# Patient Record
Sex: Male | Born: 1950
Health system: Southern US, Community
[De-identification: ages and names within clinical notes are randomized; demographics above are authoritative.]

## PROBLEM LIST (undated history)

## (undated) DIAGNOSIS — I10 Essential (primary) hypertension: Secondary | ICD-10-CM

## (undated) DIAGNOSIS — I2510 Atherosclerotic heart disease of native coronary artery without angina pectoris: Secondary | ICD-10-CM

## (undated) DIAGNOSIS — I719 Aortic aneurysm of unspecified site, without rupture: Secondary | ICD-10-CM

## (undated) DIAGNOSIS — E119 Type 2 diabetes mellitus without complications: Secondary | ICD-10-CM

## (undated) DIAGNOSIS — M545 Low back pain, unspecified: Secondary | ICD-10-CM

## (undated) DIAGNOSIS — E538 Deficiency of other specified B group vitamins: Secondary | ICD-10-CM

## (undated) DIAGNOSIS — R252 Cramp and spasm: Secondary | ICD-10-CM

## (undated) DIAGNOSIS — D492 Neoplasm of unspecified behavior of bone, soft tissue, and skin: Secondary | ICD-10-CM

## (undated) DIAGNOSIS — R42 Dizziness and giddiness: Secondary | ICD-10-CM

## (undated) DIAGNOSIS — T7840XA Allergy, unspecified, initial encounter: Secondary | ICD-10-CM

## (undated) DIAGNOSIS — F32A Depression, unspecified: Secondary | ICD-10-CM

## (undated) DIAGNOSIS — F419 Anxiety disorder, unspecified: Secondary | ICD-10-CM

## (undated) DIAGNOSIS — K219 Gastro-esophageal reflux disease without esophagitis: Secondary | ICD-10-CM

## (undated) DIAGNOSIS — F329 Major depressive disorder, single episode, unspecified: Secondary | ICD-10-CM

## (undated) DIAGNOSIS — E291 Testicular hypofunction: Secondary | ICD-10-CM

## (undated) DIAGNOSIS — E785 Hyperlipidemia, unspecified: Secondary | ICD-10-CM

## (undated) DIAGNOSIS — M1990 Unspecified osteoarthritis, unspecified site: Secondary | ICD-10-CM

## (undated) HISTORY — DX: Depression, unspecified: F32.A

## (undated) HISTORY — PX: INGUINAL HERNIA REPAIR: SUR1180

## (undated) HISTORY — PX: FOOT SURGERY: SHX648

## (undated) HISTORY — PX: OTHER SURGICAL HISTORY: SHX169

## (undated) HISTORY — DX: Cramp and spasm: R25.2

## (undated) HISTORY — DX: Gastro-esophageal reflux disease without esophagitis: K21.9

## (undated) HISTORY — DX: Unspecified osteoarthritis, unspecified site: M19.90

## (undated) HISTORY — PX: POLYPECTOMY: SHX149

## (undated) HISTORY — PX: COLONOSCOPY: SHX174

## (undated) HISTORY — DX: Low back pain: M54.5

## (undated) HISTORY — DX: Low back pain, unspecified: M54.50

## (undated) HISTORY — DX: Aortic aneurysm of unspecified site, without rupture: I71.9

## (undated) HISTORY — DX: Essential (primary) hypertension: I10

## (undated) HISTORY — DX: Neoplasm of unspecified behavior of bone, soft tissue, and skin: D49.2

## (undated) HISTORY — DX: Major depressive disorder, single episode, unspecified: F32.9

## (undated) HISTORY — DX: Anxiety disorder, unspecified: F41.9

## (undated) HISTORY — DX: Hyperlipidemia, unspecified: E78.5

## (undated) HISTORY — DX: Dizziness and giddiness: R42

## (undated) HISTORY — DX: Testicular hypofunction: E29.1

## (undated) HISTORY — DX: Type 2 diabetes mellitus without complications: E11.9

## (undated) HISTORY — DX: Allergy, unspecified, initial encounter: T78.40XA

## (undated) HISTORY — DX: Deficiency of other specified B group vitamins: E53.8

## (undated) HISTORY — DX: Atherosclerotic heart disease of native coronary artery without angina pectoris: I25.10

---

## 2001-07-14 ENCOUNTER — Encounter: Payer: Self-pay | Admitting: Gastroenterology

## 2004-05-01 ENCOUNTER — Ambulatory Visit: Payer: Self-pay | Admitting: Internal Medicine

## 2004-05-20 ENCOUNTER — Ambulatory Visit: Payer: Self-pay | Admitting: Internal Medicine

## 2004-07-07 ENCOUNTER — Ambulatory Visit: Payer: Self-pay

## 2004-12-01 ENCOUNTER — Ambulatory Visit: Payer: Self-pay | Admitting: Internal Medicine

## 2005-02-18 ENCOUNTER — Ambulatory Visit: Payer: Self-pay | Admitting: Internal Medicine

## 2005-02-25 ENCOUNTER — Ambulatory Visit: Payer: Self-pay | Admitting: Internal Medicine

## 2005-06-02 ENCOUNTER — Ambulatory Visit: Payer: Self-pay | Admitting: Internal Medicine

## 2005-06-09 ENCOUNTER — Ambulatory Visit: Payer: Self-pay | Admitting: Internal Medicine

## 2005-06-23 ENCOUNTER — Ambulatory Visit: Payer: Self-pay | Admitting: Cardiology

## 2005-08-31 ENCOUNTER — Ambulatory Visit: Payer: Self-pay | Admitting: Internal Medicine

## 2005-09-02 ENCOUNTER — Ambulatory Visit: Payer: Self-pay | Admitting: Internal Medicine

## 2005-11-09 ENCOUNTER — Ambulatory Visit: Payer: Self-pay | Admitting: Internal Medicine

## 2005-11-13 ENCOUNTER — Ambulatory Visit: Payer: Self-pay | Admitting: Internal Medicine

## 2008-08-28 ENCOUNTER — Encounter: Payer: Self-pay | Admitting: Internal Medicine

## 2008-10-05 ENCOUNTER — Encounter: Payer: Self-pay | Admitting: Internal Medicine

## 2008-11-26 ENCOUNTER — Telehealth: Payer: Self-pay | Admitting: Internal Medicine

## 2008-11-30 ENCOUNTER — Telehealth: Payer: Self-pay | Admitting: Internal Medicine

## 2008-12-21 ENCOUNTER — Ambulatory Visit: Payer: Self-pay | Admitting: Internal Medicine

## 2008-12-21 DIAGNOSIS — M1710 Unilateral primary osteoarthritis, unspecified knee: Secondary | ICD-10-CM

## 2008-12-21 DIAGNOSIS — M1990 Unspecified osteoarthritis, unspecified site: Secondary | ICD-10-CM | POA: Insufficient documentation

## 2008-12-21 DIAGNOSIS — IMO0002 Reserved for concepts with insufficient information to code with codable children: Secondary | ICD-10-CM | POA: Insufficient documentation

## 2008-12-21 DIAGNOSIS — I2510 Atherosclerotic heart disease of native coronary artery without angina pectoris: Secondary | ICD-10-CM | POA: Insufficient documentation

## 2008-12-21 DIAGNOSIS — F419 Anxiety disorder, unspecified: Secondary | ICD-10-CM | POA: Insufficient documentation

## 2008-12-21 DIAGNOSIS — E1159 Type 2 diabetes mellitus with other circulatory complications: Secondary | ICD-10-CM | POA: Insufficient documentation

## 2008-12-21 DIAGNOSIS — E119 Type 2 diabetes mellitus without complications: Secondary | ICD-10-CM | POA: Insufficient documentation

## 2008-12-21 DIAGNOSIS — E785 Hyperlipidemia, unspecified: Secondary | ICD-10-CM | POA: Insufficient documentation

## 2008-12-21 DIAGNOSIS — R252 Cramp and spasm: Secondary | ICD-10-CM | POA: Insufficient documentation

## 2008-12-21 DIAGNOSIS — I10 Essential (primary) hypertension: Secondary | ICD-10-CM | POA: Insufficient documentation

## 2008-12-21 DIAGNOSIS — F411 Generalized anxiety disorder: Secondary | ICD-10-CM | POA: Insufficient documentation

## 2008-12-21 DIAGNOSIS — M109 Gout, unspecified: Secondary | ICD-10-CM | POA: Insufficient documentation

## 2008-12-21 DIAGNOSIS — F4323 Adjustment disorder with mixed anxiety and depressed mood: Secondary | ICD-10-CM | POA: Insufficient documentation

## 2009-01-15 ENCOUNTER — Ambulatory Visit: Payer: Self-pay | Admitting: Internal Medicine

## 2009-01-15 DIAGNOSIS — M545 Low back pain: Secondary | ICD-10-CM

## 2009-01-16 LAB — CONVERTED CEMR LAB
Bilirubin Urine: NEGATIVE
Ketones, ur: NEGATIVE mg/dL
Total Protein, Urine: NEGATIVE mg/dL
pH: 5.5 (ref 5.0–8.0)

## 2009-02-20 ENCOUNTER — Telehealth: Payer: Self-pay | Admitting: Internal Medicine

## 2009-03-19 ENCOUNTER — Ambulatory Visit: Payer: Self-pay | Admitting: Internal Medicine

## 2009-03-19 LAB — CONVERTED CEMR LAB
ALT: 21 units/L (ref 0–53)
AST: 19 units/L (ref 0–37)
Alkaline Phosphatase: 73 units/L (ref 39–117)
Bilirubin, Direct: 0.2 mg/dL (ref 0.0–0.3)
CO2: 29 meq/L (ref 19–32)
Chloride: 101 meq/L (ref 96–112)
Sodium: 141 meq/L (ref 135–145)
TSH: 1.15 microintl units/mL (ref 0.35–5.50)
Total CHOL/HDL Ratio: 2
Total Protein: 6.9 g/dL (ref 6.0–8.3)

## 2009-03-22 ENCOUNTER — Ambulatory Visit: Payer: Self-pay | Admitting: Internal Medicine

## 2009-03-22 DIAGNOSIS — H9319 Tinnitus, unspecified ear: Secondary | ICD-10-CM | POA: Insufficient documentation

## 2009-03-22 DIAGNOSIS — R42 Dizziness and giddiness: Secondary | ICD-10-CM | POA: Insufficient documentation

## 2009-05-10 ENCOUNTER — Ambulatory Visit: Payer: Self-pay | Admitting: Internal Medicine

## 2009-05-10 DIAGNOSIS — D485 Neoplasm of uncertain behavior of skin: Secondary | ICD-10-CM | POA: Insufficient documentation

## 2009-08-01 ENCOUNTER — Telehealth: Payer: Self-pay | Admitting: Internal Medicine

## 2009-09-24 ENCOUNTER — Telehealth: Payer: Self-pay | Admitting: Internal Medicine

## 2009-12-02 ENCOUNTER — Telehealth (INDEPENDENT_AMBULATORY_CARE_PROVIDER_SITE_OTHER): Payer: Self-pay | Admitting: *Deleted

## 2009-12-02 ENCOUNTER — Ambulatory Visit: Payer: Self-pay | Admitting: Internal Medicine

## 2009-12-02 DIAGNOSIS — H9190 Unspecified hearing loss, unspecified ear: Secondary | ICD-10-CM | POA: Insufficient documentation

## 2009-12-04 LAB — CONVERTED CEMR LAB
Albumin: 4.7 g/dL (ref 3.5–5.2)
BUN: 19 mg/dL (ref 6–23)
Bilirubin, Direct: 0.2 mg/dL (ref 0.0–0.3)
Eosinophils Relative: 1.2 % (ref 0.0–5.0)
GFR calc non Af Amer: 128.99 mL/min (ref >60)
HCT: 39.8 % (ref 39.0–52.0)
HDL: 65 mg/dL (ref >39.00)
Hemoglobin: 13.9 g/dL (ref 13.0–17.0)
Hgb A1c MFr Bld: 6.4 % (ref 4.6–6.5)
Ketones, ur: NEGATIVE mg/dL
Lymphs Abs: 1 10*3/uL (ref 0.7–4.0)
Monocytes Relative: 8.8 % (ref 3.0–12.0)
Platelets: 223 10*3/uL (ref 150.0–400.0)
Potassium: 4.9 meq/L (ref 3.5–5.1)
Sodium: 140 meq/L (ref 135–145)
Specific Gravity, Urine: 1.025 (ref 1.000–1.030)
Total Bilirubin: 0.9 mg/dL (ref 0.3–1.2)
Total Protein, Urine: NEGATIVE mg/dL
Total Protein: 7.2 g/dL (ref 6.0–8.3)
Triglycerides: 41 mg/dL (ref 0.0–149.0)
Uric Acid, Serum: 6.6 mg/dL (ref 4.0–7.8)
Urine Glucose: NEGATIVE mg/dL
VLDL: 8.2 mg/dL (ref 0.0–40.0)
Vitamin B-12: 114 pg/mL — ABNORMAL LOW (ref 211–911)
WBC: 5.9 10*3/uL (ref 4.5–10.5)
pH: 5.5 (ref 5.0–8.0)

## 2009-12-06 ENCOUNTER — Encounter: Payer: Self-pay | Admitting: Internal Medicine

## 2010-01-29 ENCOUNTER — Ambulatory Visit: Payer: Self-pay | Admitting: Cardiovascular Disease

## 2010-02-10 ENCOUNTER — Ambulatory Visit: Payer: Self-pay | Admitting: Internal Medicine

## 2010-02-10 DIAGNOSIS — R5383 Other fatigue: Secondary | ICD-10-CM | POA: Insufficient documentation

## 2010-02-10 DIAGNOSIS — E538 Deficiency of other specified B group vitamins: Secondary | ICD-10-CM | POA: Insufficient documentation

## 2010-02-10 DIAGNOSIS — R5381 Other malaise: Secondary | ICD-10-CM | POA: Insufficient documentation

## 2010-03-20 ENCOUNTER — Ambulatory Visit: Payer: Self-pay | Admitting: Internal Medicine

## 2010-03-20 LAB — CONVERTED CEMR LAB
Testosterone: 315.44 ng/dL — ABNORMAL LOW (ref 350.00–890.00)
Vitamin B-12: >1500 pg/mL — ABNORMAL HIGH (ref 211–911)

## 2010-03-24 ENCOUNTER — Ambulatory Visit: Payer: Self-pay | Admitting: Internal Medicine

## 2010-03-24 DIAGNOSIS — M25569 Pain in unspecified knee: Secondary | ICD-10-CM | POA: Insufficient documentation

## 2010-03-24 DIAGNOSIS — E291 Testicular hypofunction: Secondary | ICD-10-CM | POA: Insufficient documentation

## 2010-03-24 DIAGNOSIS — K4090 Unilateral inguinal hernia, without obstruction or gangrene, not specified as recurrent: Secondary | ICD-10-CM | POA: Insufficient documentation

## 2010-03-25 ENCOUNTER — Telehealth (INDEPENDENT_AMBULATORY_CARE_PROVIDER_SITE_OTHER): Payer: Self-pay | Admitting: *Deleted

## 2010-04-01 ENCOUNTER — Encounter: Payer: Self-pay | Admitting: Internal Medicine

## 2010-04-10 ENCOUNTER — Telehealth: Payer: Self-pay | Admitting: Internal Medicine

## 2010-05-07 ENCOUNTER — Encounter: Payer: Self-pay | Admitting: Cardiovascular Disease

## 2010-05-22 ENCOUNTER — Ambulatory Visit
Admission: RE | Admit: 2010-05-22 | Discharge: 2010-05-22 | Payer: Self-pay | Source: Home / Self Care | Attending: Internal Medicine | Admitting: Internal Medicine

## 2010-05-22 ENCOUNTER — Telehealth (INDEPENDENT_AMBULATORY_CARE_PROVIDER_SITE_OTHER): Payer: Self-pay | Admitting: *Deleted

## 2010-05-22 DIAGNOSIS — J069 Acute upper respiratory infection, unspecified: Secondary | ICD-10-CM | POA: Insufficient documentation

## 2010-05-31 ENCOUNTER — Ambulatory Visit
Admission: RE | Admit: 2010-05-31 | Discharge: 2010-05-31 | Payer: Self-pay | Source: Home / Self Care | Attending: Family Medicine | Admitting: Family Medicine

## 2010-05-31 DIAGNOSIS — J209 Acute bronchitis, unspecified: Secondary | ICD-10-CM | POA: Insufficient documentation

## 2010-06-03 LAB — COMPREHENSIVE METABOLIC PANEL
ALT: 18 U/L (ref 0–53)
AST: 18 U/L (ref 0–37)
Alkaline Phosphatase: 73 U/L (ref 39–117)
CO2: 32 mEq/L (ref 19–32)
Calcium: 9.8 mg/dL (ref 8.4–10.5)
GFR calc Af Amer: >60 mL/min (ref >60)
Glucose, Bld: 140 mg/dL — ABNORMAL HIGH (ref 70–99)
Potassium: 4.8 mEq/L (ref 3.5–5.1)
Sodium: 141 mEq/L (ref 135–145)
Total Protein: 6.8 g/dL (ref 6.0–8.3)

## 2010-06-03 LAB — PROTIME-INR: Prothrombin Time: 13.2 seconds (ref 11.6–15.2)

## 2010-06-03 LAB — CBC
Hemoglobin: 14.2 g/dL (ref 13.0–17.0)
MCH: 31.5 pg (ref 26.0–34.0)
MCV: 91.6 fL (ref 78.0–100.0)
Platelets: 280 10*3/uL (ref 150–400)
RBC: 4.51 MIL/uL (ref 4.22–5.81)
WBC: 4.8 10*3/uL (ref 4.0–10.5)

## 2010-06-03 LAB — DIFFERENTIAL
Lymphs Abs: 1.2 10*3/uL (ref 0.7–4.0)
Monocytes Relative: 9 % (ref 3–12)
Neutro Abs: 3 10*3/uL (ref 1.7–7.7)
Neutrophils Relative %: 64 % (ref 43–77)

## 2010-06-03 LAB — SURGICAL PCR SCREEN: Staphylococcus aureus: POSITIVE — AB

## 2010-06-03 NOTE — Progress Notes (Signed)
Summary: Androgel PA  Phone Note Call from Patient   Caller: Patient Summary of Call: pt states Androgel needs PA.  Initial call taken by: Lanier Prude, [facility]),  March 25, 2010 12:04 PM  Follow-up for Phone Call        Insurance requires PA on androgel 1.62% pump. Ok to proceed?  PA # [phone] [id number] Follow-up by: Lamar Sprinkles, CMA,  March 26, 2010 5:26 PM  Additional Follow-up for Phone Call Additional follow up Details #1::        ok Thank you!  Additional Follow-up by: [name] MD,  March 31, 2010 5:36 PM    Additional Follow-up for Phone Call Additional follow up Details #2::    paperwork will be faxed case number [id number] Follow-up by: Margaret Pyle, CMA,  April 01, 2010 3:29 PM  Additional Follow-up for Phone Call Additional follow up Details #3:: Details for Additional Follow-up Action Taken: Form completed and faxed to [phone] [employer].Marland KitchenMarland KitchenMarland KitchenAlvy Beal Archie CMA  April 01, 2010 3:37 PM   PA Approved 04/01/10-12/25/12.  Additional Follow-up by: Dagoberto Reef,  April 07, 2010 10:56 AM

## 2010-06-03 NOTE — Consult Note (Signed)
Summary: [facility]  [facility]   Imported By: Sherian Rein 01/01/2010 10:07:29  _____________________________________________________________________  External Attachment:    Type:   Image     Comment:   External Document

## 2010-06-03 NOTE — Assessment & Plan Note (Signed)
Summary: FU ON MEDS/ REFERRAL TO CARDIOLOGY /NWS   Vital Signs:  Patient profile:   60 year old male Height:      69 inches Weight:      185 pounds BMI:     27.42 Temp:     97.5 degrees F oral Pulse rate:   64 / minute Pulse rhythm:   regular Resp:     16 per minute BP sitting:   120 / 78  (left arm) Cuff size:   regular  Vitals Entered By: Lanier Prude, CMA(AAMA) (December 02, 2009 8:02 AM) CC: f/u to discuss meds Is Patient Diabetic? Yes Comments pt is not taking Benadryl 25mg . Please remove from his list   CC:  f/u to discuss meds.  History of Present Illness: The patient presents for a follow up of hypertension, CAD, diabetes, hyperlipidemia F/u LBP C/o R knee swelling in inner aspect x years The patient presents for a wellness examination   Preventive Screening-Counseling & Management  Caffeine-Diet-Exercise     Does Patient Exercise: yes  Current Medications (verified): 1)  Norvasc 2.5 Mg Tabs (Amlodipine Besylate) .... Once Daily Qhs 2)  Benadryl 25 Mg Tabs (Diphenhydramine Hcl) .... Once Daily 3)  Glucophage 1000 Mg Tabs (Metformin Hcl) .... Two Times A Day 4)  Diovan 320 Mg Tabs (Valsartan) .... Once Daily 5)  Aspirin 81 Mg Tbec (Aspirin) .... Once Daily 6)  Fluoxetine Hcl 20 Mg Caps (Fluoxetine Hcl) .... Once Daily 7)  Ambien 5 Mg Tabs (Zolpidem Tartrate) .... 2.5 At Bedtime 8)  Vitamin C 1000 Mg Tabs (Ascorbic Acid) .... Two Times A Day 9)  Coq10 100 Mg Caps (Coenzyme Q10) .... Take 300 For Leg Aches As Needed 10)  Vitamin D3 1000 Unit  Tabs (Cholecalciferol) .[name].. 1 By Mouth Daily 11)  Crestor 40 Mg Tabs (Rosuvastatin Calcium) .[name].. 1 By Mouth Every Other Day 12)  Ibuprofen 600 Mg  Tabs (Ibuprofen) .[name].. 1 By Mouth Two Times A Day Pc As Needed Pain 13)  Tramadol Hcl 50 Mg  Tabs (Tramadol Hcl) .[name].. 1-2 By Mouth Two Times A Day As Needed Pain 14)  Lorazepam 1 Mg Tabs (Lorazepam) .[name].. 1 By Mouth Three  Times A Day As Needed Anxiety  Allergies (verified): No  Known Drug Allergies  Past History:  Past Medical History: Last updated: 03/22/2009 Anxiety Coronary artery disease Depression Gout Hyperlipidemia Hypertension Diabetes mellitus, type II Low back pain Vertigo, tinnitus, hearing loss s/p ENT eval 2009  Family History: Last updated: 12/21/2008 Family History of CAD Male 1st degree relative <50 Family History Hypertension  Past Surgical History: Stent procedure  Social History: Occupation: [employer], maintenance Married, 2 girls Alcohol use-yes Drug use-no Regular exercise-yes, swimming Does Patient Exercise:  yes  Review of Systems       The patient complains of hematochezia and depression.  The patient denies anorexia, abdominal pain, melena, fever, weight loss, weight gain, vision loss, decreased hearing, hoarseness, chest pain, syncope, dyspnea on exertion, peripheral edema, prolonged cough, headaches, hemoptysis, severe indigestion/heartburn, hematuria, incontinence, genital sores, muscle weakness, suspicious skin lesions, transient blindness, difficulty walking, unusual weight change, abnormal bleeding, enlarged lymph nodes, angioedema, and testicular masses.         Lost wt on diet. LBP, fatigue  Physical Exam  General:  overweight-appearing.   Head:  Normocephalic and atraumatic without obvious abnormalities. No apparent alopecia or balding. Eyes:  No corneal or conjunctival inflammation noted. EOMI. Perrla. Funduscopic exam benign, without hemorrhages, exudates or papilledema. Vision  grossly normal. Ears:  External ear exam shows no significant lesions or deformities.  Otoscopic examination reveals clear canals, tympanic membranes are intact bilaterally without bulging, retraction, inflammation or discharge. Hearing is grossly normal bilaterally. Nose:  External nasal examination shows no deformity or inflammation. Nasal mucosa are pink and moist without lesions or exudates. Mouth:  Oral mucosa and oropharynx  without lesions or exudates.  Teeth in good repair. Neck:  No deformities, masses, or tenderness noted. Chest Wall:  No deformities, masses, tenderness or gynecomastia noted. Lungs:  Normal respiratory effort, chest expands symmetrically. Lungs are clear to auscultation, no crackles or wheezes. Heart:  Normal rate and regular rhythm. S1 and S2 normal without gallop, murmur, click, rub or other extra sounds. Abdomen:  Bowel sounds positive,abdomen soft and non-tender without masses, organomegaly or hernias noted. Rectal:  No external abnormalities noted. Normal sphincter tone. No rectal masses or tenderness. G(-) Genitalia:  Testes bilaterally descended without nodularity, tenderness or masses. No scrotal masses or lesions. No penis lesions or urethral discharge. Prostate:  1+ enlarged.   Msk:  Lumbar-sacral spine is a little  tender to palpation over paraspinal muscles and painfull with the ROM on L R medial distal femur head with possible lipoma, NT Pulses:  R and L carotid,radial,femoral,dorsalis pedis and posterior tibial pulses are full and equal bilaterally Extremities:  No clubbing, cyanosis, edema, or deformity noted with normal full range of motion of all joints.   Neurologic:  No cranial nerve deficits noted. Station and gait are normal. Plantar reflexes are down-going bilaterally. DTRs are symmetrical throughout. Sensory, motor and coordinative functions appear intact. Skin:  6x3 mm mole on mid abdomen Cervical Nodes:  No lymphadenopathy noted Inguinal Nodes:  No significant adenopathy Psych:  Cognition and judgment appear intact. Alert and cooperative with normal attention span and concentration. No apparent delusions, illusions, hallucinations a little depressed appearing.  not suicidal.     Impression & Recommendations:  Problem # 1:  PHYSICAL EXAMINATION (ICD-V70.0) Assessment New  Colon recomended Labs ordered Health and age related issues were discussed. Available  screening tests and vaccinations were discussed as well. Healthy life style including good diet and execise was discussed.   Orders: TLB-BMP (Basic Metabolic Panel-BMET) (80048-METABOL) TLB-CBC Platelet - w/Differential (85025-CBCD) TLB-Hepatic/Liver Function Pnl (80076-HEPATIC) TLB-Lipid Panel (80061-LIPID) TLB-PSA (Prostate Specific Antigen) (84153-PSA) TLB-TSH (Thyroid Stimulating Hormone) (84443-TSH) TLB-Udip ONLY (81003-UDIP) Gastroenterology Referral (GI)  Problem # 2:  DIABETES MELLITUS, TYPE II (ICD-250.00) Assessment: Improved  His updated medication list for this problem includes:    Glucophage 1000 Mg Tabs (Metformin hcl) .[name]..[name] Two times a day    Diovan 320 Mg Tabs (Valsartan) ..... Once daily    Aspirin 81 Mg Tbec (Aspirin) ..... Once daily  Orders: TLB-A1C / Hgb A1C (Glycohemoglobin) (83036-A1C) TLB-B12, Serum-Total ONLY (19147-W29) TLB-Uric Acid, Blood (84550-URIC)  Problem # 3:  LEG CRAMPS (ICD-729.82) Assessment: Improved  Problem # 4:  HYPERLIPIDEMIA (ICD-272.4) Assessment: Improved  His updated medication list for this problem includes:    Crestor 40 Mg Tabs (Rosuvastatin calcium) .[name]... 1 by mouth every other day  Orders: TLB-A1C / Hgb A1C (Glycohemoglobin) (83036-A1C) TLB-B12, Serum-Total ONLY (56213-Y86) TLB-Uric Acid, Blood (84550-URIC)  Problem # 5:  GOUT (ICD-274.9) Assessment: Improved  Problem # 6:  DEPRESSION (ICD-311) Assessment: Unchanged  The following medications were removed from the medication list:    Fluoxetine Hcl 20 Mg Caps (Fluoxetine hcl) ..... Once daily His updated medication list for this problem includes:    Lorazepam 1 Mg Tabs (Lorazepam) .Marland KitchenMarland KitchenMarland KitchenMarland Kitchen  1 by mouth three  times a day as needed anxiety    Cymbalta 60 Mg Cpep (Duloxetine hcl) .[name]... 1 by mouth once daily for depression  Problem # 7:  CORONARY ARTERY DISEASE (ICD-414.00) Assessment: Unchanged  His updated medication list for this problem includes:    Norvasc 2.5 Mg  Tabs (Amlodipine besylate) ..... Once daily qhs    Diovan 320 Mg Tabs (Valsartan) ..... Once daily    Aspirin 81 Mg Tbec (Aspirin) ..... Once daily  Orders: TLB-A1C / Hgb A1C (Glycohemoglobin) (83036-A1C) TLB-B12, Serum-Total ONLY (40347-Q25) TLB-Uric Acid, Blood (84550-URIC) Cardiology Referral (Cardiology) Dr [name]  Problem # 8:  LOW BACK PAIN (ICD-724.2) Assessment: Unchanged See "Patient Instructions".  His updated medication list for this problem includes:    Aspirin 81 Mg Tbec (Aspirin) ..... Once daily    Ibuprofen 600 Mg Tabs (Ibuprofen) .[name]... 1 by mouth two times a day pc as needed pain    Tramadol Hcl 50 Mg Tabs (Tramadol hcl) .[name]... 1-2 by mouth two times a day as needed pain  Problem # 9:  HEARING LOSS, MILD (ICD-389.9) Assessment: New  Orders: Audiology (Audio)  Complete Medication List: 1)  Norvasc 2.5 Mg Tabs (Amlodipine besylate) .... Once daily qhs 2)  Benadryl 25 Mg Tabs (Diphenhydramine hcl) .... Once daily 3)  Glucophage 1000 Mg Tabs (Metformin hcl) .... Two times a day 4)  Diovan 320 Mg Tabs (Valsartan) .... Once daily 5)  Aspirin 81 Mg Tbec (Aspirin) .... Once daily 6)  Ambien 5 Mg Tabs (Zolpidem tartrate) .... 2.5 at bedtime 7)  Vitamin C 1000 Mg Tabs (Ascorbic acid) .... Two times a day 8)  Coq10 100 Mg Caps (Coenzyme q10) .... Take 300 for leg aches as needed 9)  Vitamin D3 1000 Unit Tabs (Cholecalciferol) .[name].. 1 by mouth daily 10)  Crestor 40 Mg Tabs (Rosuvastatin calcium) .[name].. 1 by mouth every other day 11)  Ibuprofen 600 Mg Tabs (Ibuprofen) .[name].. 1 by mouth two times a day pc as needed pain 12)  Tramadol Hcl 50 Mg Tabs (Tramadol hcl) .[name].. 1-2 by mouth two times a day as needed pain 13)  Lorazepam 1 Mg Tabs (Lorazepam) .[name].. 1 by mouth three  times a day as needed anxiety 14)  Cymbalta 60 Mg Cpep (Duloxetine hcl) .[name].. 1 by mouth once daily for depression  Patient Instructions: 1)  Please schedule a follow-up appointment in 2 months. 2)  Use  stretching and balance exercises that I have provided (15 min. or longer every day)  Prescriptions: AMBIEN 5 MG TABS (ZOLPIDEM TARTRATE) 2.5 at bedtime  #180 x 1   Entered and Authorized by:   [name] MD   Signed by:   [name] MD on 12/02/2009   Method used:   Print then Give to Patient   RxID:   [id number] CYMBALTA 60 MG CPEP (DULOXETINE HCL) 1 by mouth once daily for depression  #30 x 6   Entered and Authorized by:   [name] MD   Signed by:   [name] MD on 12/02/2009   Method used:   Print then Give to Patient   RxID:   [id number]

## 2010-06-03 NOTE — Progress Notes (Signed)
----   Converted from flag ---- ---- 12/02/2009 9:36 AM, Edman Circle wrote: appt 8/30 @ 11:45 with [name]  ---- 12/02/2009 8:53 AM, Dagoberto Reef wrote: Thanks  ---- 12/02/2009 8:45 AM, Georgina Quint Plotnikov MD wrote: The following orders have been entered for this patient and placed on Admin Hold:  Type:     Referral       Code:   Audio Description:   Audiology Order Date:   12/02/2009   Authorized By:   [name] MD Order #:   [id number] Clinical Notes:   Aim hearind Dx hearing loss  Type:     Referral       Code:   Cardiology Description:   Cardiology Referral Order Date:   12/02/2009   Authorized By:   [name] MD Order #:   [id number] Clinical Notes:   Dr [name] Dx CAD  Type:     Referral       Code:   GI Description:   Gastroenterology Referral Order Date:   12/02/2009   Authorized By:   [name] MD Order #:   [id number] Clinical Notes:   In Nov 2011 - screen colon Dr Marina Goodell ------------------------------

## 2010-06-03 NOTE — Progress Notes (Signed)
Summary: med change  Phone Note From Pharmacy   Summary of Call: colchicine is d/c. Per MD ok to change to Colcrys. *updated med list. Initial call taken by: Lucious Groves,  August 01, 2009 10:40 AM    New/Updated Medications: COLCRYS 0.6 MG TABS (COLCHICINE) take as directed Prescriptions: COLCRYS 0.6 MG TABS (COLCHICINE) take as directed  #30 x 3   Entered by:   Lucious Groves   Authorized by:   [name] MD   Signed by:   Lucious Groves on 08/01/2009   Method used:   Electronically to        [facility]* (retail)       [address]       [place], [place]  [address]       Ph: [phone]       Fax: [phone]   RxID:   [id number]

## 2010-06-03 NOTE — Progress Notes (Signed)
Summary: hernia  Phone Note Call from Patient Call back at Home Phone [phone]   Caller: Patient Reason for Call: Talk to Doctor, Referral Summary of Call: Pt states at last vist md dx him with hernia. at the time it was'nt bothering him. Pt states since then hernia has gotten bigger,and requesting referral to have hernia removed. Did inform pt md out of office this pm will be tomomorrow to let him know status. Initial call taken by: Orlan Leavens RMA,  April 10, 2010 4:59 PM  Follow-up for Phone Call        ok Surg consult  Follow-up by: [name] MD,  April 11, 2010 1:06 PM  Additional Follow-up for Phone Call Additional follow up Details #1::        Pt informed  Additional Follow-up by: Lamar Sprinkles, CMA,  April 11, 2010 1:58 PM

## 2010-06-03 NOTE — Miscellaneous (Signed)
Summary: [facility]   Imported By: Lennie Odor 05/15/2009 15:17:51  _____________________________________________________________________  External Attachment:    Type:   Image     Comment:   External Document

## 2010-06-03 NOTE — Assessment & Plan Note (Signed)
Summary: 2 MONTH FOLLOW UP-LB   Vital Signs:  Patient profile:   60 year old male Height:      69 inches Weight:      184 pounds BMI:     27.27 Pulse rate:   60 / minute Pulse rhythm:   regular Resp:     16 per minute BP sitting:   136 / 80  (left arm) Cuff size:   regular  Vitals Entered By: Lanier Prude, CMA(AAMA) (February 10, 2010 8:03 AM) CC: 2 mo f/u Is Patient Diabetic? Yes Comments pt states Cymbalta caused dizziness and disorientaion so he could not take it.      CC:  2 mo f/u.  History of Present Illness: The patient presents for a follow up of hypertension, diabetes, hyperlipidemia C/o worsening depression, dizziness  Current Medications (verified): 1)  Norvasc 2.5 Mg Tabs (Amlodipine Besylate) .... Once Daily Qhs 2)  Glucophage 1000 Mg Tabs (Metformin Hcl) .... Two Times A Day 3)  Diovan 320 Mg Tabs (Valsartan) .... Once Daily 4)  Aspirin 81 Mg Tbec (Aspirin) .... Once Daily 5)  Ambien 5 Mg Tabs (Zolpidem Tartrate) .... 2.5 At Bedtime 6)  Vitamin C 1000 Mg Tabs (Ascorbic Acid) .... Two Times A Day 7)  Coq10 100 Mg Caps (Coenzyme Q10) .... Take 300 For Leg Aches As Needed 8)  Vitamin D3 1000 Unit  Tabs (Cholecalciferol) .[name].. 1 By Mouth Daily 9)  Crestor 40 Mg Tabs (Rosuvastatin Calcium) .[name].. 1 By Mouth Every Other Day 10)  Tramadol Hcl 50 Mg  Tabs (Tramadol Hcl) .[name].. 1-2 By Mouth Two Times A Day As Needed Pain 11)  Lorazepam 1 Mg Tabs (Lorazepam) .... As Needed 12)  Aleve 220 Mg Tabs (Naproxen Sodium) .... As Needed 13)  Bd Lancet Ultrafine 33g  Misc (Lancets) .... As Needed  Allergies (verified): 1)  Cymbalta 2)  Wellbutrin 3)  Effexor 4)  Fluoxetine Hcl  Past History:  Social History: Last updated: 12/02/2009 Occupation: [employer], maintenance Married, 2 girls Alcohol use-yes Drug use-no Regular exercise-yes, swimming  Past Medical History: HYPERTENSION HYPERLIPIDEMIA CORONARY ARTERY DISEASE  HEARING LOSS, MILD  PHYSICAL EXAMINATION    NEOPLASM OF UNCERTAIN BEHAVIOR OF SKIN  TINNITUS VERTIGO tinnitus, hearing loss s/p ENT eval 2009 LOW BACK PAIN  LEG CRAMPS FAMILY HISTORY OF CAD MALE 1ST DEGREE RELATIVE <50  OSTEOARTHROSIS UNSPEC WHETHER GEN/LOC LOWER LEG  DIABETES MELLITUS, TYPE II GOUT  DEPRESSION  ANXIETY Vit B12 def  Review of Systems  The patient denies fever, chest pain, syncope, abdominal pain, difficulty walking, and depression.    Physical Exam  General:  overweight-appearing.   Nose:  External nasal examination shows no deformity or inflammation. Nasal mucosa are pink and moist without lesions or exudates. Mouth:  Oral mucosa and oropharynx without lesions or exudates.  Teeth in good repair. Lungs:  Normal respiratory effort, chest expands symmetrically. Lungs are clear to auscultation, no crackles or wheezes. Heart:  Normal rate and regular rhythm. S1 and S2 normal without gallop, murmur, click, rub or other extra sounds. Abdomen:  Bowel sounds positive,abdomen soft and non-tender without masses, organomegaly or hernias noted. Msk:  Lumbar-sacral spine is a little  tender to palpation over paraspinal muscles and painfull with the ROM on L R medial distal femur head with possible lipoma, NT Extremities:  No clubbing, cyanosis, edema, or deformity noted with normal full range of motion of all joints.   Neurologic:  No cranial nerve deficits noted. Station and gait are normal.  Plantar reflexes are down-going bilaterally. DTRs are symmetrical throughout. Sensory, motor and coordinative functions appear intact. Skin:  6x3 mm mole on mid abdomen Psych:  Cognition and judgment appear intact. Alert and cooperative with normal attention span and concentration. No apparent delusions, illusions, hallucinations a little depressed appearing.  not suicidal.     Impression & Recommendations:  Problem # 1:  VITAMIN B12 DEFICIENCY (ICD-266.2) Assessment New For some  reason he has not been taking B12. B12 def  implications discussed Risks of noncompliance with treatment discussed. Compliance encouraged.  Orders: Vit B12 1000 mcg (J3420) Admin of Therapeutic Inj  intramuscular or subcutaneous (40981)  Problem # 2:  FATIGUE (ICD-780.79) Assessment: Unchanged Treat #1  Problem # 3:  HYPERLIPIDEMIA (ICD-272.4) Assessment: Unchanged  His updated medication list for this problem includes:    Crestor 40 Mg Tabs (Rosuvastatin calcium) .[name]... 1 by mouth every other day  Problem # 4:  CORONARY ARTERY DISEASE (ICD-414.00) Assessment: Unchanged  His updated medication list for this problem includes:    Norvasc 2.5 Mg Tabs (Amlodipine besylate) ..... Once daily qhs    Diovan 320 Mg Tabs (Valsartan) ..... Once daily    Aspirin 81 Mg Tbec (Aspirin) ..... Once daily  Problem # 5:  TINNITUS (ICD-388.30) Assessment: Unchanged Treat B12 def  Problem # 6:  DEPRESSION (ICD-311) Assessment: Unchanged Restart antidepressant when B12 def is corrected. Side effects may have been related to #1 (Viibrid - new) His updated medication list for this problem includes:    Lorazepam 1 Mg Tabs (Lorazepam) .[name]... As needed  Complete Medication List: 1)  Norvasc 2.5 Mg Tabs (Amlodipine besylate) .... Once daily qhs 2)  Glucophage 1000 Mg Tabs (Metformin hcl) .... Two times a day 3)  Diovan 320 Mg Tabs (Valsartan) .... Once daily 4)  Aspirin 81 Mg Tbec (Aspirin) .... Once daily 5)  Ambien 5 Mg Tabs (Zolpidem tartrate) .[name].. 1 at bedtime prn 6)  Vitamin C 1000 Mg Tabs (Ascorbic acid) .... Two times a day 7)  Coq10 100 Mg Caps (Coenzyme q10) .... Take 300 for leg aches as needed 8)  Vitamin D3 1000 Unit Tabs (Cholecalciferol) .[name].. 1 by mouth daily 9)  Crestor 40 Mg Tabs (Rosuvastatin calcium) .[name].. 1 by mouth every other day 10)  Tramadol Hcl 50 Mg Tabs (Tramadol hcl) .[name].. 1-2 by mouth two times a day as needed pain 11)  Lorazepam 1 Mg Tabs (Lorazepam) .... As needed 12)  Aleve 220 Mg Tabs (Naproxen sodium) .... As  needed 13)  Vitamin B-12 1000 Mcg Subl (Cyanocobalamin) .[name].. 1 by mouth qd  Patient Instructions: 1)  Please schedule a follow-up appointment in 6 weeks. 2)  Vit B12 266.20 3)  Testosterone 780.79 4)  Viibrid - start starter pack in 3-4 wks Prescriptions: AMBIEN 5 MG TABS (ZOLPIDEM TARTRATE) 1 at bedtime prn  #30 x 6   Entered and Authorized by:   [name] MD   Signed by:   Lanier Prude, CMA(AAMA) on 02/10/2010   Method used:   Print then Give to Patient   RxID:   [id number] VITAMIN B-12 1000 MCG SUBL (CYANOCOBALAMIN) 1 by mouth qd  #100 x 3   Entered and Authorized by:   [name] MD   Signed by:   [name] MD on 02/10/2010   Method used:   Electronically to        [facility]* (retail)       [address] [facility]  [place], [place]  [address]       Ph: [phone]       Fax: [phone]   RxID:   [id number]    Medication Administration  Injection # 1:    Medication: Vit B12 1000 mcg    Diagnosis: VITAMIN B12 DEFICIENCY (ICD-266.2)    Route: IM    Site: L deltoid    Exp Date: 11/02/2011    Lot #: [id number]    Mfr: [employer]    Patient tolerated injection without complications    Given by: Lanier Prude, CMA(AAMA) (February 10, 2010 9:41 AM)  Orders Added: 1)  Vit B12 1000 mcg [J3420] 2)  Admin of Therapeutic Inj  intramuscular or subcutaneous [96372] 3)  Est. Patient Level IV [93235]

## 2010-06-03 NOTE — Assessment & Plan Note (Signed)
Summary: mole on chest changing shape,3 dimesions/#/cd   Vital Signs:  Patient profile:   60 year old male Weight:      198 pounds Temp:     98.4 degrees F oral Pulse rate:   60 / minute BP sitting:   126 / 76  (left arm)  Vitals Entered By: Tora Perches (May 10, 2009 10:45 AM)  Procedure Note  Mole Biopsy/Removal: The patient complains of changing mole. Indication: changing lesion Consent signed: yes  Procedure # 1: shave biopsy    Size (in cm): 0.6 x 0.3    Region: medial    Location: abd wall    Comment: Risks including but not limited by incomplete procedure, bleeding, infection, recurrence were discussed with the patient. Consent form was signed.     Instrument used: dermablade    Anesthesia: 0.5 ml 1% lidocaine w/epinephrine  Cleaned and prepped with: alcohol and betadine Wound dressing: neosporin and bandaid Instructions: daily dressing changes Additional Instructions: Tol well. Compl none  CC: mole on chest Is Patient Diabetic? Yes   CC:  mole on chest.  History of Present Illness: C/o mole on abd wall - changing. F/u hyperlipidemia - has a question re crestor - less achy.  Preventive Screening-Counseling & Management  Alcohol-Tobacco     Smoking Status: never  Current Medications (verified): 1)  Norvasc 2.5 Mg Tabs (Amlodipine Besylate) .... Once Daily Qhs 2)  Benadryl 25 Mg Tabs (Diphenhydramine Hcl) .... Once Daily 3)  Glucophage 1000 Mg Tabs (Metformin Hcl) .... Two Times A Day 4)  Diovan 320 Mg Tabs (Valsartan) .... Once Daily 5)  Aspirin 81 Mg Tbec (Aspirin) .... Once Daily 6)  Fluoxetine Hcl 20 Mg Caps (Fluoxetine Hcl) .... Once Daily 7)  Lorazepam 0.5 Mg Tabs (Lorazepam) .... At Bedtime 8)  Colchicine 0.6 Mg Tabs (Colchicine) .... For Gout As Needed 9)  Ambien 5 Mg Tabs (Zolpidem Tartrate) .... 2.5 At Bedtime 10)  Vitamin C 1000 Mg Tabs (Ascorbic Acid) .... Two Times A Day 11)  Coq10 100 Mg Caps (Coenzyme Q10) .... Take 300 For Leg Aches  As Needed 12)  Vitamin D3 1000 Unit  Tabs (Cholecalciferol) .[name].. 1 By Mouth Daily 13)  Crestor 40 Mg Tabs (Rosuvastatin Calcium) .[name].. 1 Tablet By Mouth Daily 14)  Ibuprofen 600 Mg  Tabs (Ibuprofen) .[name].. 1 By Mouth Two Times A Day Pc As Needed Pain 15)  Tramadol Hcl 50 Mg  Tabs (Tramadol Hcl) .[name].. 1-2 By Mouth Two Times A Day As Needed Pain  Allergies (verified): No Known Drug Allergies  Physical Exam  General:  overweight-appearing.   Mouth:  Oral mucosa and oropharynx without lesions or exudates.  Teeth in good repair. Abdomen:  Bowel sounds positive,abdomen soft and non-tender without masses, organomegaly or hernias noted. Skin:  6x3 mm mole on mid abdomen Psych:  Cognition and judgment appear intact. Alert and cooperative with normal attention span and concentration. No apparent delusions, illusions, hallucinations not depressed appearing.     Impression & Recommendations:  Problem # 1:  NEOPLASM OF UNCERTAIN BEHAVIOR OF SKIN (ICD-238.2) Assessment New  Options discussed. He chose to have a removal now  Orders: Shave Skin Lesion 0.6-1.0 cm/trunk/arm/leg (11301)  Problem # 2:  HYPERLIPIDEMIA (ICD-272.4) Assessment: Improved Can try Crestor 1 by mouth q 2 d His updated medication list for this problem includes:    Crestor 40 Mg Tabs (Rosuvastatin calcium) .[name]... 1 tablet by mouth daily  Complete Medication List: 1)  Norvasc 2.5 Mg Tabs (Amlodipine  besylate) .... Once daily qhs 2)  Benadryl 25 Mg Tabs (Diphenhydramine hcl) .... Once daily 3)  Glucophage 1000 Mg Tabs (Metformin hcl) .... Two times a day 4)  Diovan 320 Mg Tabs (Valsartan) .... Once daily 5)  Aspirin 81 Mg Tbec (Aspirin) .... Once daily 6)  Fluoxetine Hcl 20 Mg Caps (Fluoxetine hcl) .... Once daily 7)  Lorazepam 0.5 Mg Tabs (Lorazepam) .... At bedtime 8)  Colchicine 0.6 Mg Tabs (Colchicine) .... For gout as needed 9)  Ambien 5 Mg Tabs (Zolpidem tartrate) .... 2.5 at bedtime 10)  Vitamin C 1000 Mg Tabs  (Ascorbic acid) .... Two times a day 11)  Coq10 100 Mg Caps (Coenzyme q10) .... Take 300 for leg aches as needed 12)  Vitamin D3 1000 Unit Tabs (Cholecalciferol) .[name].. 1 by mouth daily 13)  Crestor 40 Mg Tabs (Rosuvastatin calcium) .[name].. 1 tablet by mouth daily 14)  Ibuprofen 600 Mg Tabs (Ibuprofen) .[name].. 1 by mouth two times a day pc as needed pain 15)  Tramadol Hcl 50 Mg Tabs (Tramadol hcl) .[name].. 1-2 by mouth two times a day as needed pain  Patient Instructions: 1)  Keep return office visit

## 2010-06-03 NOTE — Assessment & Plan Note (Signed)
Summary: ec3/last saw degent in 07 in [place] ofc/jml   CC:  establish with a cardiologist.  History of Present Illness: Jared Alvarez is seen today at the request of Dr [name].  He had a stent to the LAD in 1997 done in [place]. He had a repeat procedure 5-6 months latter.  Has had myovues every 12-18 months since with no need for repeat cath.  Initial angina was like indigestion.  Active, swimming 3x/week.  Takes statin every other day due to leg pain.  Other risk factors under good control.  HbA1c usually in 6 range.  Needs refills for BD lancets.    Current Problems (verified): 1)  Hypertension  (ICD-401.9) 2)  Hyperlipidemia  (ICD-272.4) 3)  Coronary Artery Disease  (ICD-414.00) 4)  Hearing Loss, Mild  (ICD-389.9) 5)  Physical Examination  (ICD-V70.0) 6)  Neoplasm of Uncertain Behavior of Skin  (ICD-238.2) 7)  Tinnitus  (ICD-388.30) 8)  Vertigo  (ICD-780.4) 9)  Low Back Pain  (ICD-724.2) 10)  Leg Cramps  (ICD-729.82) 11)  Family History of Cad Male 1st Degree Relative <50  (ICD-V17.3) 12)  Osteoarthrosis Unspec Whether Gen/loc Lower Leg  (ICD-715.96) 13)  Diabetes Mellitus, Type II  (ICD-250.00) 14)  Gout  (ICD-274.9) 15)  Depression  (ICD-311) 16)  Anxiety  (ICD-300.00)  Current Medications (verified): 1)  Norvasc 2.5 Mg Tabs (Amlodipine Besylate) .... Once Daily Qhs 2)  Glucophage 1000 Mg Tabs (Metformin Hcl) .... Two Times A Day 3)  Diovan 320 Mg Tabs (Valsartan) .... Once Daily 4)  Aspirin 81 Mg Tbec (Aspirin) .... Once Daily 5)  Ambien 5 Mg Tabs (Zolpidem Tartrate) .... 2.5 At Bedtime 6)  Vitamin C 1000 Mg Tabs (Ascorbic Acid) .... Two Times A Day 7)  Coq10 100 Mg Caps (Coenzyme Q10) .... Take 300 For Leg Aches As Needed 8)  Vitamin D3 1000 Unit  Tabs (Cholecalciferol) .[name].. 1 By Mouth Daily 9)  Crestor 40 Mg Tabs (Rosuvastatin Calcium) .[name].. 1 By Mouth Every Other Day 10)  Tramadol Hcl 50 Mg  Tabs (Tramadol Hcl) .[name].. 1-2 By Mouth Two Times A Day As Needed Pain 11)  Lorazepam 1  Mg Tabs (Lorazepam) .... As Needed 12)  Aleve 220 Mg Tabs (Naproxen Sodium) .... As Needed 13)  Bd Lancet Ultrafine 33g  Misc (Lancets) .... As Needed  Allergies (verified): No Known Drug Allergies  Past History:  Past Medical History: Last updated: 01/28/2010 HYPERTENSION HYPERLIPIDEMIA CORONARY ARTERY DISEASE  HEARING LOSS, MILD  PHYSICAL EXAMINATION  NEOPLASM OF UNCERTAIN BEHAVIOR OF SKIN  TINNITUS VERTIGO tinnitus, hearing loss s/p ENT eval 2009 LOW BACK PAIN  LEG CRAMPS FAMILY HISTORY OF CAD MALE 1ST DEGREE RELATIVE <50  OSTEOARTHROSIS UNSPEC WHETHER GEN/LOC LOWER LEG  DIABETES MELLITUS, TYPE II GOUT  DEPRESSION  ANXIETY  Past Surgical History: Last updated: 12/02/2009 Stent procedure  Family History: Last updated: 12/21/2008 Family History of CAD Male 1st degree relative <50 Family History Hypertension  Social History: Last updated: 12/02/2009 Occupation: [employer], maintenance Married, 2 girls Alcohol use-yes Drug use-no Regular exercise-yes, swimming  Review of Systems       Denies fever, malais, weight loss, blurry vision, decreased visual acuity, cough, sputum, SOB, hemoptysis, pleuritic pain, palpitaitons, heartburn, abdominal pain, melena, lower extremity edema, claudication, or rash.   Vital Signs:  Patient profile:   60 year old male Height:      69 inches Weight:      187 pounds BMI:     27.71 Pulse rate:   65 /  minute Resp:     16 per minute BP sitting:   139 / 78  (left arm)  Vitals Entered By: Kem Parkinson (January 29, 2010 2:01 PM)  Physical Exam  General:  Affect appropriate Healthy:  appears stated age HEENT: normal Neck supple with no adenopathy JVP normal no bruits no thyromegaly Lungs clear with no wheezing and good diaphragmatic motion Heart:  S1/S2 no murmur,rub, gallop or click PMI normal Abdomen: benighn, BS positve, no tenderness, no AAA no bruit.  No HSM or HJR Distal pulses intact with no bruits No  edema Neuro non-focal Skin warm and dry    Impression & Recommendations:  Problem # 1:  HYPERTENSION (ICD-401.9) Well controlled His updated medication list for this problem includes:    Norvasc 2.5 Mg Tabs (Amlodipine besylate) ..... Once daily qhs    Diovan 320 Mg Tabs (Valsartan) ..... Once daily    Aspirin 81 Mg Tbec (Aspirin) ..... Once daily  Problem # 2:  HYPERLIPIDEMIA (ICD-272.4) At goal with limited side effects on every other day statin His updated medication list for this problem includes:    Crestor 40 Mg Tabs (Rosuvastatin calcium) .[name]... 1 by mouth every other day  CHOL: 143 (12/02/2009)   LDL: 70 (12/02/2009)   HDL: 65.00 (12/02/2009)   TG: 41.0 (12/02/2009)  Problem # 3:  CORONARY ARTERY DISEASE (ICD-414.00) Stable with no angina and active.  Normal ECG His updated medication list for this problem includes:    Norvasc 2.5 Mg Tabs (Amlodipine besylate) ..... Once daily qhs    Aspirin 81 Mg Tbec (Aspirin) ..... Once daily  Orders: EKG w/ Interpretation (93000)  Patient Instructions: 1)  Your physician recommends that you schedule a follow-up appointment in: 6 MONTHS WITH DR [name] 2)  Your physician recommends that you continue on your current medications as directed. Please refer to the Current Medication list given to you today. Prescriptions: BD LANCET ULTRAFINE 33G  MISC (LANCETS) AS NEEDED  #1 BOX x 2   Entered by:   Scherrie Bateman, LPN   Authorized by:   Colon Branch, MD, [facility]   Signed by:   Scherrie Bateman, LPN on 16/02/9603   Method used:   Electronically to        [facility]* (retail)       [address]       [place], [place]  [id number]       Ph: [phone]       Fax: [phone]   RxID:   [id number]    EKG Report  Procedure date:  01/29/2010  Findings:      NSR 65 Normal ECG

## 2010-06-03 NOTE — Medication Information (Signed)
Summary: Approved/[employer]  Approved/[employer]   Imported By: Lester [place]akai 04/09/2010 09:17:02  _____________________________________________________________________  External Attachment:    Type:   Image     Comment:   External Document

## 2010-06-03 NOTE — Progress Notes (Signed)
Summary: lorazepam clarification  Phone Note From Pharmacy   Summary of Call: Pharmacy requesting refill on lorazepam 1mg  three times a day , however EMR shows .5mg  at bedtime. Please advise on which mg pt should have thanks.Alvy Beal Archie CMA  Sep 24, 2009 9:09 AM   Follow-up for Phone Call        Please ask pt how he is taking it Follow-up by: [name] MD,  Sep 24, 2009 5:45 PM  Additional Follow-up for Phone Call Additional follow up Details #1::        Wk # has vm w/different name, no vm left. Attempted hm # and left vm to call office back..............Marland KitchenLamar Sprinkles, CMA  Sep 25, 2009 10:25 AM   Pt returned call, he would like call back at [facility] #.........Marland KitchenLamar Sprinkles, CMA  Sep 25, 2009 5:48 PM     Additional Follow-up for Phone Call Additional follow up Details #2::    Pt wants rx for lorazepam 1mg  1 to 3 daily as needed anxiety. Ok to [employer] and send in?...................Marland KitchenLamar Sprinkles, CMA  Sep 25, 2009 6:12 PM   OK Follow-up by: [name] MD,  Sep 26, 2009 10:34 AM  New/Updated Medications: LORAZEPAM 1 MG TABS (LORAZEPAM) 1 by mouth three  times a day as needed anxiety Prescriptions: LORAZEPAM 1 MG TABS (LORAZEPAM) 1 by mouth three  times a day as needed anxiety  #90 x 3   Entered by:   Lamar Sprinkles, CMA   Authorized by:   [name] MD   Signed by:   Lamar Sprinkles, CMA on 09/26/2009   Method used:   Telephoned to ...       [facility]* (retail)       [address]       [place], [place]  [id number]       Ph: [phone]       Fax: [phone]   RxID:   [id number]

## 2010-06-03 NOTE — Assessment & Plan Note (Signed)
Summary: 6 WK FU---STC   Vital Signs:  Patient profile:   60 year old male Height:      69 inches Weight:      186 pounds BMI:     27.57 Temp:     97.7 degrees F oral Pulse rate:   60 / minute Pulse rhythm:   regular Resp:     16 per minute BP sitting:   140 / 90  (left arm) Cuff size:   regular  Vitals Entered By: Lanier Prude, CMA(AAMA) (March 24, 2010 10:05 AM) CC: 6 wk f/u  Comments pt is not taking Tramadol. pt is requesting Rx for Viibryd and another step pack   CC:  6 wk f/u .  History of Present Illness: The patient presents for a follow up of hypertension, diabetes, hyperlipidemia, depression   Current Medications (verified): 1)  Norvasc 2.5 Mg Tabs (Amlodipine Besylate) .... Once Daily Qhs 2)  Glucophage 1000 Mg Tabs (Metformin Hcl) .... Two Times A Day 3)  Diovan 320 Mg Tabs (Valsartan) .... Once Daily 4)  Aspirin 81 Mg Tbec (Aspirin) .... Once Daily 5)  Ambien 5 Mg Tabs (Zolpidem Tartrate) .[name].. 1 At Bedtime Prn 6)  Vitamin C 1000 Mg Tabs (Ascorbic Acid) .... Two Times A Day 7)  Coq10 100 Mg Caps (Coenzyme Q10) .... Take 300 For Leg Aches As Needed 8)  Vitamin D3 1000 Unit  Tabs (Cholecalciferol) .[name].. 1 By Mouth Daily 9)  Crestor 40 Mg Tabs (Rosuvastatin Calcium) .[name].. 1 By Mouth Every Other Day 10)  Tramadol Hcl 50 Mg  Tabs (Tramadol Hcl) .[name].. 1-2 By Mouth Two Times A Day As Needed Pain 11)  Lorazepam 1 Mg Tabs (Lorazepam) .... As Needed 12)  Aleve 220 Mg Tabs (Naproxen Sodium) .... As Needed 13)  Vitamin B-12 1000 Mcg Subl (Cyanocobalamin) .[name].. 1 By Mouth Qd 14)  Viibryd 40 Mg Tabs (Vilazodone Hcl) .[name].. 1 By Mouth Once Daily  Allergies (verified): 1)  Cymbalta 2)  Wellbutrin 3)  Effexor 4)  Fluoxetine Hcl 5)  Lipitor 6)  Simvastatin  Past History:  Past Surgical History: Last updated: 12/02/2009 Stent procedure  Family History: Last updated: 12/21/2008 Family History of CAD Male 1st degree relative <50 Family History Hypertension  Social  History: Last updated: 12/02/2009 Occupation: [employer] Engineer, maintenance Married, 2 girls Alcohol use-yes Drug use-no Regular exercise-yes, swimming  Past Medical History: HYPERTENSION HYPERLIPIDEMIA CORONARY ARTERY DISEASE  HEARING LOSS, MILD  PHYSICAL EXAMINATION  NEOPLASM OF UNCERTAIN BEHAVIOR OF SKIN  TINNITUS VERTIGO tinnitus, hearing loss s/p ENT eval 2009 LOW BACK PAIN  LEG CRAMPS FAMILY HISTORY OF CAD MALE 1ST DEGREE RELATIVE <50  OSTEOARTHROSIS UNSPEC WHETHER GEN/LOC LOWER LEG  DIABETES MELLITUS, TYPE II GOUT  DEPRESSION  ANXIETY Vit B12 def Hypogonadism 2011 R inguinal hernia - small 2011  Review of Systems       The patient complains of weight gain and depression.  The patient denies fever, dyspnea on exertion, abdominal pain, difficulty walking, vision loss, syncope, and prolonged cough.    Physical Exam  General:  overweight-appearing.   Head:  Normocephalic and atraumatic without obvious abnormalities. No apparent alopecia or balding. Eyes:  No corneal or conjunctival inflammation noted. EOMI. Perrla. Funduscopic exam benign, without hemorrhages, exudates or papilledema. Vision grossly normal. Nose:  External nasal examination shows no deformity or inflammation. Nasal mucosa are pink and moist without lesions or exudates. Mouth:  Oral mucosa and oropharynx without lesions or exudates.  Teeth in good repair. Neck:  No deformities, masses, or tenderness noted. Lungs:  Normal respiratory effort, chest expands symmetrically. Lungs are clear to auscultation, no crackles or wheezes. Heart:  Normal rate and regular rhythm. S1 and S2 normal without gallop, murmur, click, rub or other extra sounds. Abdomen:  Bowel sounds positive,abdomen soft and non-tender without masses, organomegaly A slight R inguinal hernia is noted Genitalia:  no testicular masses or atrophy.   Msk:  Lumbar-sacral spine is a little  tender to palpation over paraspinal muscles and painfull  with the ROM on L R medial distal femur head with possible lipoma, NT Neurologic:  No cranial nerve deficits noted. Station and gait are normal. Plantar reflexes are down-going bilaterally. DTRs are symmetrical throughout. Sensory, motor and coordinative functions appear intact. Skin:  6x3 mm mole on mid abdomen Cervical Nodes:  No lymphadenopathy noted Inguinal Nodes:  No significant adenopathy Psych:  Cognition and judgment appear intact. Alert and cooperative with normal attention span and concentration. No apparent delusions, illusions, hallucinations a little depressed appearing.  not suicidal.     Impression & Recommendations:  Problem # 1:  HYPERTENSION (ICD-401.9) Assessment Unchanged  His updated medication list for this problem includes:    Norvasc 2.5 Mg Tabs (Amlodipine besylate) ..... Once daily qhs    Diovan 320 Mg Tabs (Valsartan) ..... Once daily  BP today: 140/90 Prior BP: 136/80 (02/10/2010)  Labs Reviewed: K+: 4.9 (12/02/2009) Creat: : 0.7 (12/02/2009)   Chol: 143 (12/02/2009)   HDL: 65.00 (12/02/2009)   LDL: 70 (12/02/2009)   TG: 41.0 (12/02/2009)  Problem # 2:  VITAMIN B12 DEFICIENCY (ICD-266.2) Assessment: Improved On the regimen of medicine(s) reflected in the chart    Problem # 3:  HYPOGONADISM (ICD-257.2) Assessment: New Risks vs benefits and controversies of a long term testosterone use including MIs were discussed. Start Rx. See meds.  Problem # 4:  INGUINAL HERNIA, UNILATERAL (ICD-550.90) R mild Assessment: New Discussed - will observe  Problem # 5:  DEPRESSION (ICD-311) Assessment: Unchanged  His updated medication list for this problem includes:    Lorazepam 1 Mg Tabs (Lorazepam) .[name]... 1 po bid as needed    Viibryd 40 Mg Tabs (Vilazodone hcl) .[name]... 1 by mouth once daily  Complete Medication List: 1)  Norvasc 2.5 Mg Tabs (Amlodipine besylate) .... Once daily qhs 2)  Glucophage 1000 Mg Tabs (Metformin hcl) .... Two times a day 3)  Diovan 320  Mg Tabs (Valsartan) .... Once daily 4)  Aspirin 81 Mg Tbec (Aspirin) .... Once daily 5)  Ambien 5 Mg Tabs (Zolpidem tartrate) .[name].. 1 at bedtime prn 6)  Vitamin C 1000 Mg Tabs (Ascorbic acid) .... Two times a day 7)  Coq10 100 Mg Caps (Coenzyme q10) .... Take 300 for leg aches as needed 8)  Vitamin D3 1000 Unit Tabs (Cholecalciferol) .[name].. 1 by mouth daily 9)  Crestor 40 Mg Tabs (Rosuvastatin calcium) .[name].. 1 by mouth every other day 10)  Tramadol Hcl 50 Mg Tabs (Tramadol hcl) .[name].. 1-2 by mouth two times a day as needed pain 11)  Lorazepam 1 Mg Tabs (Lorazepam) .[name].. 1 po bid as needed 12)  Aleve 220 Mg Tabs (Naproxen sodium) .... As needed 13)  Vitamin B-12 1000 Mcg Subl (Cyanocobalamin) .[name].. 1 by mouth qd 14)  Viibryd 40 Mg Tabs (Vilazodone hcl) .[name].. 1 by mouth once daily 15)  Androgel Pump 1.6 % Gel (testosterone)  .... Use 2 pumps on skin q am 16)  Livalo 4 Mg Tabs (Pitavastatin calcium) .[name].. 1 by mouth qd  Other Orders: Flu Vaccine 18yrs + MEDICARE PATIENTS (B1478) Administration Flu vaccine - MCR (G0008) T-Knee Right 2 view (73560TC)  Patient Instructions: 1)  Please schedule a follow-up appointment in 3 months. 2)  BMP prior to visit, ICD-9: 3)  Hepatic Panel prior to visit, ICD-9: 4)  Lipid Panel prior to visit, ICD-9: 5)  HbgA1C prior to visit, ICD-9:272.0 6)  Vit D b12 266.20 7)  testosterone 995.20 Prescriptions: LORAZEPAM 1 MG TABS (LORAZEPAM) 1 po bid as needed  #60 x 6   Entered and Authorized by:   [name] MD   Signed by:   [name] MD on 03/24/2010   Method used:   Print then Give to Patient   RxID:   [id number] GLUCOPHAGE 1000 MG TABS (METFORMIN HCL) two times a day  #180 x 3   Entered and Authorized by:   [name] MD   Signed by:   [name] MD on 03/24/2010   Method used:   Print then Give to Patient   RxID:   [id number] DIOVAN 320 MG TABS (VALSARTAN) once daily  #90 x 3   Entered and Authorized by:    [name] MD   Signed by:   [name] MD on 03/24/2010   Method used:   Print then Give to Patient   RxID:   [id number] LIVALO 4 MG TABS (PITAVASTATIN CALCIUM) 1 by mouth qd  #90 x 3   Entered and Authorized by:   [name] MD   Signed by:   [name] MD on 03/24/2010   Method used:   Print then Give to Patient   RxID:   [id number] VIIBRYD 40 MG TABS (VILAZODONE HCL) 1 by mouth once daily  #90 x 3   Entered and Authorized by:   [name] MD   Signed by:   [name] MD on 03/24/2010   Method used:   Print then Give to Patient   RxID:   [id number] ANDROGEL PUMP 1.6 % GEL (TESTOSTERONE) use 2 pumps on skin q am  #1 x 12   Entered and Authorized by:   [name] MD   Signed by:   [name] MD on 03/24/2010   Method used:   Print then Give to Patient   RxID:   [id number]    Orders Added: 1)  Est. Patient Level V [37628] 2)  Flu Vaccine 61yrs + MEDICARE PATIENTS [Q2039] 3)  Administration Flu vaccine - MCR [G0008] 4)  T-Knee Right 2 view [73560TC]   Immunizations Administered:  Influenza Vaccine # 1:    Vaccine Type: Fluvax MCR    Site: right deltoid    Mfr: Sanofi Pasteur    Dose: 0.5 ml    Route: IM    Given by: Lanier Prude, CMA(AAMA)    Exp. Date: 11/01/2010    Lot #: [id number]    VIS given: 11/26/09 version given March 24, 2010.   Immunizations Administered:  Influenza Vaccine # 1:    Vaccine Type: Fluvax MCR    Site: right deltoid    Mfr: Sanofi Pasteur    Dose: 0.5 ml    Route: IM    Given by: Lanier Prude, CMA(AAMA)    Exp. Date: 11/01/2010    Lot #: [id number]    VIS given: 11/26/09 version given March 24, 2010.

## 2010-06-05 NOTE — Progress Notes (Signed)
Summary: Records Request  Faxed OV, EKG & Stress to [place] at [facility] ([phone]). Debby Freiberg  May 22, 2010 10:49 AM

## 2010-06-05 NOTE — Assessment & Plan Note (Signed)
Summary: STILL HAVING COLD SYMPTOMS/NWS   Vital Signs:  Patient profile:   60 year old male Weight:      185 pounds BMI:     28.23 O2 Sat:      98 % on Room air Temp:     97.6 degrees F Pulse rate:   61 / minute BP sitting:   110 / 70  Vitals Entered By: Lamar Sprinkles, CMA (May 31, 2010 9:34 AM)  O2 Flow:  Room air CC: persistent cough, productive w/dk yellow-green mucus - has preopt exam tuesday   History of Present Illness: Patient is a 59 yo Caucasian in today with 12 day h/o worsening cough now productive of yellow to green sputum. Has noted some low grade fevers/chllls/HA/nasal congestion. Denies any sore throat/CP/palp/SOB/GU c/o. His bowels are a bit harder and moving less frequently due to some dehydration. His sugars have stayed well controlled despite his illness. He is preparing for inguinal hernia repair next week and worried this may hurt his ability to proceed with the surgery. No other c/o today  Allergies: 1)  Cymbalta 2)  Wellbutrin 3)  Effexor 4)  Fluoxetine Hcl 5)  Lipitor 6)  Simvastatin  Past History:  Past medical history reviewed for relevance to current acute and chronic problems. Social history (including risk factors) reviewed for relevance to current acute and chronic problems.  Past Medical History: Reviewed history from 03/24/2010 and no changes required. HYPERTENSION HYPERLIPIDEMIA CORONARY ARTERY DISEASE  HEARING LOSS, MILD  PHYSICAL EXAMINATION  NEOPLASM OF UNCERTAIN BEHAVIOR OF SKIN  TINNITUS VERTIGO tinnitus, hearing loss s/p ENT eval 2009 LOW BACK PAIN  LEG CRAMPS FAMILY HISTORY OF CAD MALE 1ST DEGREE RELATIVE <50  OSTEOARTHROSIS UNSPEC WHETHER GEN/LOC LOWER LEG  DIABETES MELLITUS, TYPE II GOUT  DEPRESSION  ANXIETY Vit B12 def Hypogonadism 2011 R inguinal hernia - small 2011  Social History: Reviewed history from 12/02/2009 and no changes required. Occupation: [employer], maintenance Married, 2 girls Alcohol  use-yes Drug use-no Regular exercise-yes, swimming  Review of Systems      See HPI  Physical Exam  General:  Well-developed,well-nourished,in no acute distress; alert,appropriate and cooperative throughout examination Head:  Normocephalic and atraumatic without obvious abnormalities. No apparent alopecia or balding. Ears:  External ear exam shows no significant lesions or deformities.  Otoscopic examination reveals clear canals, tympanic membranes are intact bilaterally without bulging, retraction, inflammation or discharge. Hearing is grossly normal bilaterally. Mouth:  Oral mucosa and oropharynx without lesions or exudates.  Teeth in good repair. mild erythema in posterior oropharynx Neck:  No deformities, masses, or tenderness noted. Lungs:  Normal respiratory effort, chest expands symmetrically. Lungs are clear to auscultation, no crackles or wheezes. Heart:  Normal rate and regular rhythm. S1 and S2 normal without gallop, murmur, click, rub or other extra sounds. Psych:  Cognition and judgment appear intact. Alert and cooperative with normal attention span and concentration. No apparent delusions, illusions, hallucinations   Impression & Recommendations:  Problem # 1:  ACUTE BRONCHITIS (ICD-466.0)  His updated medication list for this problem includes:    Hydrocod Polst-chlorphen Polst 10-8 Mg/97ml Lqcr (Hydrocod polst-chlorphen polst) .Marland KitchenMarland KitchenMarland KitchenMarland Kitchen 5 ml by mouth two times a day as needed for cough    Cefdinir 300 Mg Caps (Cefdinir) .[name]... 1 cap by mouth two times a day x 10 days    Mucinex 600 Mg Xr12h-tab (Guaifenesin) .[name]... 1 tab by mouth two times a day x 10 days Patient is on day 12 of persistent and now worsening  symptoms. His cough continues to awaken him at night and is now productive of yellow to greenish sputum. He is awaiting hernia repair next week and is anxious to have him symptoms dealt with prior to surgery Bowels have slowed some with illness, mild dehydration and coughing,  recommend increase fluids and add a benefiber daily  Problem # 2:  DIABETES MELLITUS, TYPE II (ICD-250.00)  His updated medication list for this problem includes:    Glucophage 1000 Mg Tabs (Metformin hcl) .[name]..[name] Two times a day    Diovan 320 Mg Tabs (Valsartan) ..... Once daily    Aspirin 81 Mg Tbec (Aspirin) ..... Once daily No flare in sugars with acute illness  Complete Medication List: 1)  Norvasc 2.5 Mg Tabs (Amlodipine besylate) .... Once daily qhs 2)  Glucophage 1000 Mg Tabs (Metformin hcl) .... Two times a day 3)  Diovan 320 Mg Tabs (Valsartan) .... Once daily 4)  Aspirin 81 Mg Tbec (Aspirin) .... Once daily 5)  Ambien 5 Mg Tabs (Zolpidem tartrate) .[name].. 1 at bedtime prn 6)  Vitamin C 1000 Mg Tabs (Ascorbic acid) .... Two times a day 7)  Coq10 100 Mg Caps (Coenzyme q10) .... Take 300 for leg aches as needed 8)  Vitamin D3 1000 Unit Tabs (Cholecalciferol) .[name].. 1 by mouth daily 9)  Crestor 40 Mg Tabs (Rosuvastatin calcium) .[name].. 1 by mouth every other day 10)  Tramadol Hcl 50 Mg Tabs (Tramadol hcl) .[name].. 1-2 by mouth two times a day as needed pain 11)  Lorazepam 1 Mg Tabs (Lorazepam) .[name].. 1 po bid as needed 12)  Aleve 220 Mg Tabs (Naproxen sodium) .... As needed 13)  Vitamin B-12 1000 Mcg Subl (Cyanocobalamin) .[name].. 1 by mouth qd 14)  Viibryd 40 Mg Tabs (Vilazodone hcl) .[name].. 1 by mouth once daily 15)  Androgel Pump 1.6 % Gel (testosterone)  .... Use 2 pumps on skin q am 16)  Livalo 4 Mg Tabs (Pitavastatin calcium) .[name].. 1 by mouth qd 17)  Hydrocod Polst-chlorphen Polst 10-8 Mg/42ml Lqcr (Hydrocod polst-chlorphen polst) .... 5 ml by mouth two times a day as needed for cough 18)  Cefdinir 300 Mg Caps (Cefdinir) .[name].. 1 cap by mouth two times a day x 10 days 19)  Mucinex 600 Mg Xr12h-tab (Guaifenesin) .[name].. 1 tab by mouth two times a day x 10 days  Patient Instructions: 1)  Please schedule a follow-up appointment as needed with Pmd if symptoms persist or worsen 2)  Increase clear  fluids 3)  Consider adding a fiber supplement such as Benefiber 2 tsp by mouth daily prior to hernia surgery Prescriptions: CEFDINIR 300 MG CAPS (CEFDINIR) 1 cap by mouth two times a day x 10 days  #20 x 0   Entered and Authorized by:   Danise Edge MD   Signed by:   Danise Edge MD on 05/31/2010   Method used:   Electronically to        [facility] [address] (retail)       [phone]       [place], [place]  [address]       Ph: [phone]       Fax: [phone]   RxID:   [id number]    Orders Added: 1)  Est. Patient Level III [27253]

## 2010-06-05 NOTE — Assessment & Plan Note (Signed)
Summary: DR AVP PT NO SLOT  COUGH SNEEZE  STC   Vital Signs:  Patient profile:   60 year old male Height:      68 inches Weight:      189.38 pounds BMI:     28.90 O2 Sat:      97 % on Room air Temp:     98.4 degrees F oral Pulse rate:   77 / minute BP sitting:   140 / 78  (left arm) Cuff size:   regular  Vitals Entered By: Zella Ball Ewing CMA (AAMA) (May 22, 2010 1:50 PM)  O2 Flow:  Room air CC: Cough and sneezing/RE, URI symptoms   Primary Care Provider:  [name] MD  CC:  Cough and sneezing/RE and URI symptoms.  History of Present Illness:  URI Symptoms      This is a 60 year old man who presents with URI symptoms.  The symptoms began 2 days ago.  The severity is described as mild.  The patient reports nasal congestion, sore throat, and dry cough, but denies clear nasal discharge, purulent nasal discharge, productive cough, earache, and sick contacts.  The patient denies fever, stiff neck, dyspnea, wheezing, rash, vomiting, diarrhea, use of an antipyretic, and response to antipyretic.  The patient also reports sneezing and muscle aches.  The patient denies headache and severe fatigue.  The patient denies the following risk factors for Strep sinusitis: unilateral facial pain, unilateral nasal discharge, poor response to decongestant, double sickening, tooth pain, Strep exposure, tender adenopathy, and absence of cough.    Preventive Screening-Counseling & Management  Alcohol-Tobacco     Alcohol drinks/day: 0     Alcohol Counseling: not indicated; patient does not drink     Smoking Status: never     Tobacco Counseling: not indicated; no tobacco use  Hep-HIV-STD-Contraception     Hepatitis Risk: no risk noted     HIV Risk: no risk noted     STD Risk: no risk noted      Drug Use:  no.    Clinical Review Panels:  Prevention   Last PSA:  0.29 (12/02/2009)  Immunizations   Last Flu Vaccine:  Fluvax MCR (03/24/2010)  Lipid Management   Cholesterol:  143  (12/02/2009)   LDL (bad choesterol):  70 (12/02/2009)   HDL (good cholesterol):  65.00 (12/02/2009)  Diabetes Management   HgBA1C:  6.4 (12/02/2009)   Creatinine:  0.7 (12/02/2009)   Last Flu Vaccine:  Fluvax MCR (03/24/2010)  CBC   WBC:  5.9 (12/02/2009)   RBC:  4.20 (12/02/2009)   Hgb:  13.9 (12/02/2009)   Hct:  39.8 (12/02/2009)   Platelets:  223.0 (12/02/2009)   MCV  94.8 (12/02/2009)   MCHC  35.0 (12/02/2009)   RDW  13.3 (12/02/2009)   PMN:  73.0 (12/02/2009)   Lymphs:  16.6 (12/02/2009)   Monos:  8.8 (12/02/2009)   Eosinophils:  1.2 (12/02/2009)   Basophil:  0.4 (12/02/2009)  Complete Metabolic Panel   Glucose:  129 (12/02/2009)   Sodium:  140 (12/02/2009)   Potassium:  4.9 (12/02/2009)   Chloride:  103 (12/02/2009)   CO2:  32 (12/02/2009)   BUN:  19 (12/02/2009)   Creatinine:  0.7 (12/02/2009)   Albumin:  4.7 (12/02/2009)   Total Protein:  7.2 (12/02/2009)   Calcium:  9.8 (12/02/2009)   Total Bili:  0.9 (12/02/2009)   Alk Phos:  66 (12/02/2009)   SGPT (ALT):  17 (12/02/2009)   SGOT (AST):  20 (12/02/2009)  Medications Prior to Update: 1)  Norvasc 2.5 Mg Tabs (Amlodipine Besylate) .... Once Daily Qhs 2)  Glucophage 1000 Mg Tabs (Metformin Hcl) .... Two Times A Day 3)  Diovan 320 Mg Tabs (Valsartan) .... Once Daily 4)  Aspirin 81 Mg Tbec (Aspirin) .... Once Daily 5)  Ambien 5 Mg Tabs (Zolpidem Tartrate) .[name].. 1 At Bedtime Prn 6)  Vitamin C 1000 Mg Tabs (Ascorbic Acid) .... Two Times A Day 7)  Coq10 100 Mg Caps (Coenzyme Q10) .... Take 300 For Leg Aches As Needed 8)  Vitamin D3 1000 Unit  Tabs (Cholecalciferol) .[name].. 1 By Mouth Daily 9)  Crestor 40 Mg Tabs (Rosuvastatin Calcium) .[name].. 1 By Mouth Every Other Day 10)  Tramadol Hcl 50 Mg  Tabs (Tramadol Hcl) .[name].. 1-2 By Mouth Two Times A Day As Needed Pain 11)  Lorazepam 1 Mg Tabs (Lorazepam) .[name].. 1 Po Bid As Needed 12)  Aleve 220 Mg Tabs (Naproxen Sodium) .... As Needed 13)  Vitamin B-12 1000 Mcg Subl  (Cyanocobalamin) .[name].. 1 By Mouth Qd 14)  Viibryd 40 Mg Tabs (Vilazodone Hcl) .[name].. 1 By Mouth Once Daily 15)  Androgel Pump 1.6 % Gel (Testosterone) .... Use 2 Pumps On Skin Q Am 16)  Livalo 4 Mg Tabs (Pitavastatin Calcium) .[name].. 1 By Mouth Qd  Current Medications (verified): 1)  Norvasc 2.5 Mg Tabs (Amlodipine Besylate) .... Once Daily Qhs 2)  Glucophage 1000 Mg Tabs (Metformin Hcl) .... Two Times A Day 3)  Diovan 320 Mg Tabs (Valsartan) .... Once Daily 4)  Aspirin 81 Mg Tbec (Aspirin) .... Once Daily 5)  Ambien 5 Mg Tabs (Zolpidem Tartrate) .[name].. 1 At Bedtime Prn 6)  Vitamin C 1000 Mg Tabs (Ascorbic Acid) .... Two Times A Day 7)  Coq10 100 Mg Caps (Coenzyme Q10) .... Take 300 For Leg Aches As Needed 8)  Vitamin D3 1000 Unit  Tabs (Cholecalciferol) .[name].. 1 By Mouth Daily 9)  Crestor 40 Mg Tabs (Rosuvastatin Calcium) .[name].. 1 By Mouth Every Other Day 10)  Tramadol Hcl 50 Mg  Tabs (Tramadol Hcl) .[name].. 1-2 By Mouth Two Times A Day As Needed Pain 11)  Lorazepam 1 Mg Tabs (Lorazepam) .[name].. 1 Po Bid As Needed 12)  Aleve 220 Mg Tabs (Naproxen Sodium) .... As Needed 13)  Vitamin B-12 1000 Mcg Subl (Cyanocobalamin) .[name].. 1 By Mouth Qd 14)  Viibryd 40 Mg Tabs (Vilazodone Hcl) .[name].. 1 By Mouth Once Daily 15)  Androgel Pump 1.6 % Gel (Testosterone) .... Use 2 Pumps On Skin Q Am 16)  Livalo 4 Mg Tabs (Pitavastatin Calcium) .[name].. 1 By Mouth Qd 17)  Hydrocod Polst-Chlorphen Polst 10-8 Mg/52ml Lqcr (Hydrocod Polst-Chlorphen Polst) .... 5 Ml By Mouth Two Times A Day As Needed For Cough  Allergies (verified): 1)  Cymbalta 2)  Wellbutrin 3)  Effexor 4)  Fluoxetine Hcl 5)  Lipitor 6)  Simvastatin  Past History:  Past Medical History: Last updated: 03/24/2010 HYPERTENSION HYPERLIPIDEMIA CORONARY ARTERY DISEASE  HEARING LOSS, MILD  PHYSICAL EXAMINATION  NEOPLASM OF UNCERTAIN BEHAVIOR OF SKIN  TINNITUS VERTIGO tinnitus, hearing loss s/p ENT eval 2009 LOW BACK PAIN  LEG CRAMPS FAMILY HISTORY OF CAD MALE  1ST DEGREE RELATIVE <50  OSTEOARTHROSIS UNSPEC WHETHER GEN/LOC LOWER LEG  DIABETES MELLITUS, TYPE II GOUT  DEPRESSION  ANXIETY Vit B12 def Hypogonadism 2011 R inguinal hernia - small 2011  Past Surgical History: Last updated: 12/02/2009 Stent procedure  Family History: Last updated: 12/21/2008 Family History of CAD Male 1st degree relative <50 Family History  Hypertension  Social History: Last updated: 12/02/2009 Occupation: [employer] Engineer, maintenance Married, 2 girls Alcohol use-yes Drug use-no Regular exercise-yes, swimming  Risk Factors: Alcohol Use: 0 (05/22/2010) Exercise: yes (12/02/2009)  Risk Factors: Smoking Status: never (05/22/2010)  Family History: Reviewed history from 12/21/2008 and no changes required. Family History of CAD Male 1st degree relative <50 Family History Hypertension  Social History: Reviewed history from 12/02/2009 and no changes required. Occupation: [employer] Engineer, maintenance Married, 2 girls Alcohol use-yes Drug use-no Regular exercise-yes, swimming Hepatitis Risk:  no risk noted HIV Risk:  no risk noted STD Risk:  no risk noted  Review of Systems  The patient denies anorexia, fever, weight loss, decreased hearing, hoarseness, chest pain, syncope, dyspnea on exertion, peripheral edema, prolonged cough, headaches, hemoptysis, abdominal pain, hematuria, suspicious skin lesions, enlarged lymph nodes, and angioedema.    Physical Exam  General:  alert, well-developed, well-nourished, well-hydrated, appropriate dress, normal appearance, healthy-appearing, cooperative to examination, and good hygiene.   Head:  normocephalic, atraumatic, no abnormalities observed, and no abnormalities palpated.   Eyes:  vision grossly intact, pupils equal, and no injection.   Ears:  R ear normal and L ear normal.   Nose:  External nasal examination shows no deformity or inflammation. Nasal mucosa are pink and moist without lesions or  exudates. Mouth:  Oral mucosa and oropharynx without lesions or exudates.  Teeth in good repair. Neck:  supple, full ROM, no masses, no thyromegaly, no JVD, normal carotid upstroke, no carotid bruits, no cervical lymphadenopathy, and no neck tenderness.   Lungs:  normal respiratory effort, no intercostal retractions, no accessory muscle use, normal breath sounds, no dullness, no fremitus, no crackles, and no wheezes.   Heart:  normal rate, regular rhythm, no murmur, no gallop, no rub, and no JVD.   Abdomen:  soft, non-tender, normal bowel sounds, no distention, no masses, no guarding, no rigidity, no rebound tenderness, no abdominal hernia, no inguinal hernia, no hepatomegaly, and no splenomegaly.   Msk:  No deformity or scoliosis noted of thoracic or lumbar spine.   Pulses:  R and L carotid,radial,femoral,dorsalis pedis and posterior tibial pulses are full and equal bilaterally Extremities:  No clubbing, cyanosis, edema, or deformity noted with normal full range of motion of all joints.   Neurologic:  No cranial nerve deficits noted. Station and gait are normal. Plantar reflexes are down-going bilaterally. DTRs are symmetrical throughout. Sensory, motor and coordinative functions appear intact. Skin:  turgor normal, color normal, no rashes, no suspicious lesions, no ecchymoses, no petechiae, no purpura, no ulcerations, and no edema.   Cervical Nodes:  no anterior cervical adenopathy and no posterior cervical adenopathy.   Axillary Nodes:  no R axillary adenopathy and no L axillary adenopathy.   Inguinal Nodes:  no R inguinal adenopathy and no L inguinal adenopathy.   Psych:  Cognition and judgment appear intact. Alert and cooperative with normal attention span and concentration. No apparent delusions, illusions, hallucinations   Impression & Recommendations:  Problem # 1:  URI (ICD-465.9) Assessment New  this sounds viral so will hold off on antibiotics for now and treat the symptoms His  updated medication list for this problem includes:    Aspirin 81 Mg Tbec (Aspirin) ..... Once daily    Aleve 220 Mg Tabs (Naproxen sodium) .[name]... As needed    Hydrocod Polst-chlorphen Polst 10-8 Mg/66ml Lqcr (Hydrocod polst-chlorphen polst) .Marland KitchenMarland KitchenMarland KitchenMarland Kitchen 5 ml by mouth two times a day as needed for cough  Instructed on symptomatic treatment. Call if  symptoms persist or worsen.   Problem # 2:  HYPERTENSION (ICD-401.9) Assessment: Improved  His updated medication list for this problem includes:    Norvasc 2.5 Mg Tabs (Amlodipine besylate) ..... Once daily qhs    Diovan 320 Mg Tabs (Valsartan) ..... Once daily  BP today: 140/78 Prior BP: 140/90 (03/24/2010)  Labs Reviewed: K+: 4.9 (12/02/2009) Creat: : 0.7 (12/02/2009)   Chol: 143 (12/02/2009)   HDL: 65.00 (12/02/2009)   LDL: 70 (12/02/2009)   TG: 41.0 (12/02/2009)  Complete Medication List: 1)  Norvasc 2.5 Mg Tabs (Amlodipine besylate) .... Once daily qhs 2)  Glucophage 1000 Mg Tabs (Metformin hcl) .... Two times a day 3)  Diovan 320 Mg Tabs (Valsartan) .... Once daily 4)  Aspirin 81 Mg Tbec (Aspirin) .... Once daily 5)  Ambien 5 Mg Tabs (Zolpidem tartrate) .[name].. 1 at bedtime prn 6)  Vitamin C 1000 Mg Tabs (Ascorbic acid) .... Two times a day 7)  Coq10 100 Mg Caps (Coenzyme q10) .... Take 300 for leg aches as needed 8)  Vitamin D3 1000 Unit Tabs (Cholecalciferol) .[name].. 1 by mouth daily 9)  Crestor 40 Mg Tabs (Rosuvastatin calcium) .[name].. 1 by mouth every other day 10)  Tramadol Hcl 50 Mg Tabs (Tramadol hcl) .[name].. 1-2 by mouth two times a day as needed pain 11)  Lorazepam 1 Mg Tabs (Lorazepam) .[name].. 1 po bid as needed 12)  Aleve 220 Mg Tabs (Naproxen sodium) .... As needed 13)  Vitamin B-12 1000 Mcg Subl (Cyanocobalamin) .[name].. 1 by mouth qd 14)  Viibryd 40 Mg Tabs (Vilazodone hcl) .[name].. 1 by mouth once daily 15)  Androgel Pump 1.6 % Gel (testosterone)  .... Use 2 pumps on skin q am 16)  Livalo 4 Mg Tabs (Pitavastatin calcium) .[name].. 1 by mouth qd 17)   Hydrocod Polst-chlorphen Polst 10-8 Mg/46ml Lqcr (Hydrocod polst-chlorphen polst) .... 5 ml by mouth two times a day as needed for cough  Patient Instructions: 1)  Please schedule a follow-up appointment in 2 weeks. 2)  Get plenty of rest, drink lots of clear liquids, and use Tylenol or Ibuprofen for fever and comfort. Return in 7-10 days if you're not better:sooner if you're feeling worse. Prescriptions: HYDROCOD POLST-CHLORPHEN POLST 10-8 MG/5ML LQCR (HYDROCOD POLST-CHLORPHEN POLST) 5 ml by mouth two times a day as needed for cough  #6 ounces x 0   Entered and Authorized by:   Etta Grandchild MD   Signed by:   Etta Grandchild MD on 05/22/2010   Method used:   Print then Give to Patient   RxID:   [id number]    Orders Added: 1)  Est. Patient Level IV [14782]

## 2010-06-13 ENCOUNTER — Ambulatory Visit (HOSPITAL_COMMUNITY)
Admission: RE | Admit: 2010-06-13 | Discharge: 2010-06-13 | Disposition: A | Discharge status: Home / Self Care | Payer: BC Managed Care – PPO | Attending: General Surgery | Admitting: General Surgery

## 2010-06-13 DIAGNOSIS — I2510 Atherosclerotic heart disease of native coronary artery without angina pectoris: Secondary | ICD-10-CM | POA: Insufficient documentation

## 2010-06-13 DIAGNOSIS — E119 Type 2 diabetes mellitus without complications: Secondary | ICD-10-CM | POA: Insufficient documentation

## 2010-06-13 DIAGNOSIS — I10 Essential (primary) hypertension: Secondary | ICD-10-CM | POA: Insufficient documentation

## 2010-06-13 DIAGNOSIS — K4090 Unilateral inguinal hernia, without obstruction or gangrene, not specified as recurrent: Secondary | ICD-10-CM | POA: Insufficient documentation

## 2010-06-13 LAB — GLUCOSE, CAPILLARY: Glucose-Capillary: 119 mg/dL — ABNORMAL HIGH (ref 70–99)

## 2010-06-14 LAB — GLUCOSE, CAPILLARY: Glucose-Capillary: 119 mg/dL — ABNORMAL HIGH (ref 70–99)

## 2010-06-17 ENCOUNTER — Other Ambulatory Visit: Payer: Self-pay

## 2010-06-17 NOTE — Op Note (Signed)
NAMERENNY, REMER NO.:  [id number]  MEDICAL RECORD NO.:  [id number]           PATIENT TYPE:  O  LOCATION:  DAYL                         FACILITY:  [facility]  PHYSICIAN:  Adolph Pollack, M.D.DATE OF BIRTH:  Feb 20, 1951  DATE OF PROCEDURE:  06/13/2010 DATE OF DISCHARGE:                              OPERATIVE REPORT   PREOPERATIVE DIAGNOSIS:  Right inguinal hernia.  POSTOPERATIVE DIAGNOSIS:  Indirect right inguinal hernia.  PROCEDURES: 1. Right inguinal hernia repair with mesh. 2. Placement of On-Q pump.  SURGEON:  Adolph Pollack, M.D.  ANESTHESIA:  General/LMA plus Marcaine local.  INDICATIONS:  Jared Alvarez is a 60 year old male who had a small right inguinal hernia.  Recently, he had significant coughing and sneezing and has some discomfort from this in the hernia area.  On exam, he has a reducible right inguinal hernia and now presents for repair.  He has had preoperative cardiac clearance by Dr. [name].  We discussed the procedure, risks, and aftercare preoperatively as well.  TECHNIQUE:  He was seen in the holding area and the right groin marked with my initials.  He was then brought to the operating room, placed supine on the operating table and a general anesthetic by way of LMA was administered.  The hair on lower abdominal wall and groin areas were clipped and area sterilely prepped and draped.  Local anesthetic was infiltrated superficially and deep in the right groin area.  A right groin incision was made through the skin and subcutaneous tissue until the external oblique aponeurosis was exposed. Local anesthetic was infiltrated deep to the external oblique aponeurosis.  An incision was made in the external oblique aponeurosis through the external ring medially up towards the anterior superior iliac spine laterally.  Using blunt dissection, the shelving edge of the inguinal ligament was exposed inferiorly and the internal  oblique aponeurosis and muscle were exposed superiorly.  I then identified the ilioinguinal nerve and kept it inferior to the plane of dissection.  I isolated the spermatic cord and created a posterior window around it. Indirect hernia sac with extraperitoneal contents were noted.  Using blunt dissection, I separated these from the spermatic cord and reduced it back to patulous internal ring consistent with an indirect hernia.  Following this, I retracted the spermatic cord anteriorly.  A piece of 3 inches x 6 inches polypropylene mesh was brought into the field and anchored 2 cm medial to the pubic tubercle with 2-0 Prolene suture.  The inferior aspect the mesh was anchored to the shelving edge of the inguinal ligament, a running 2-0 Prolene suture up to level 2 cm lateral to the internal ring.  A slit was cut in the mesh and two tails were wrapped around the cord.  Superior aspect of mesh was anchored to the internal oblique aponeurosis with interrupted 2-0 Vicryl sutures.  I then crossed the 2 tails of the mesh creating a new internal ring and these were anchored to the shelving edge of the inguinal ligament with a single 2-0 Prolene suture.  The tip of hemostat was able to placed in the new aperture.  Following  this, I inspected the wound and hemostasis was adequate.  I tucked the mesh deep to the external oblique aponeurosis laterally. Then brought in Angiocath and peel-away sheath device for the On-Q pump. I placed this lateral to the incision and removed the needle.  The catheter was threaded to the peel-away sheath and the peel-away sheath was removed so that the catheter was in the wound directly anterior to the mesh.  I then closed the external oblique aponeurosis over the mesh and the catheter with running 3-0 Vicryl suture.  The catheter was primed with 0.5% Marcaine.  The subcutaneous tissue was approximated with running 3-0 Vicryl suture.  The skin was closed with 4-0  Monocryl subcuticular stitch.  The catheter was secured to the skin with Steri- Strips and hooked up to the OnQ reservoir pump.  Steri-Strips were placed over the hernia repair incision. Sterile dressings were applied.  He tolerated the procedure without any apparent complications and was taken to recovery room in satisfactory condition.     Adolph Pollack, M.D.     [name]  D:  06/13/2010  T:  06/13/2010  Job:  [id number]  cc:   Georgina Quint. Plotnikov, MD [address] [place] [address]  Noralyn Pick. [name], MD, [facility] [address]  [address] [place] [address]  Electronically Signed by Avel Peace M.D. on 06/17/2010 09:54:30 AM

## 2010-06-19 ENCOUNTER — Encounter: Payer: Self-pay | Admitting: Internal Medicine

## 2010-06-24 ENCOUNTER — Ambulatory Visit: Payer: Self-pay | Admitting: Internal Medicine

## 2010-06-27 ENCOUNTER — Encounter (INDEPENDENT_AMBULATORY_CARE_PROVIDER_SITE_OTHER): Payer: Self-pay | Admitting: *Deleted

## 2010-06-27 ENCOUNTER — Encounter: Payer: Self-pay | Admitting: Internal Medicine

## 2010-06-27 ENCOUNTER — Ambulatory Visit (INDEPENDENT_AMBULATORY_CARE_PROVIDER_SITE_OTHER): Payer: BC Managed Care – PPO | Admitting: Internal Medicine

## 2010-06-27 ENCOUNTER — Other Ambulatory Visit: Payer: BC Managed Care – PPO

## 2010-06-27 ENCOUNTER — Other Ambulatory Visit: Payer: Self-pay | Admitting: Internal Medicine

## 2010-06-27 DIAGNOSIS — E7800 Pure hypercholesterolemia, unspecified: Secondary | ICD-10-CM

## 2010-06-27 DIAGNOSIS — E291 Testicular hypofunction: Secondary | ICD-10-CM

## 2010-06-27 DIAGNOSIS — T887XXA Unspecified adverse effect of drug or medicament, initial encounter: Secondary | ICD-10-CM

## 2010-06-27 DIAGNOSIS — E538 Deficiency of other specified B group vitamins: Secondary | ICD-10-CM

## 2010-06-27 DIAGNOSIS — I10 Essential (primary) hypertension: Secondary | ICD-10-CM

## 2010-06-27 LAB — LIPID PANEL
HDL: 65.1 mg/dL (ref >39.00)
LDL Cholesterol: 119 mg/dL — ABNORMAL HIGH (ref 0–99)
Total CHOL/HDL Ratio: 3
VLDL: 7 mg/dL (ref 0.0–40.0)

## 2010-06-27 LAB — BASIC METABOLIC PANEL
Calcium: 9.7 mg/dL (ref 8.4–10.5)
GFR: 124.44 mL/min (ref >60.00)
Potassium: 4.7 mEq/L (ref 3.5–5.1)
Sodium: 143 mEq/L (ref 135–145)

## 2010-06-27 LAB — HEMOGLOBIN A1C: Hgb A1c MFr Bld: 7.2 % — ABNORMAL HIGH (ref 4.6–6.5)

## 2010-06-27 LAB — HEPATIC FUNCTION PANEL
AST: 18 U/L (ref 0–37)
Albumin: 4.1 g/dL (ref 3.5–5.2)
Alkaline Phosphatase: 82 U/L (ref 39–117)
Bilirubin, Direct: 0.1 mg/dL (ref 0.0–0.3)
Total Bilirubin: 0.7 mg/dL (ref 0.3–1.2)

## 2010-06-27 LAB — VITAMIN B12: Vitamin B-12: 1480 pg/mL — ABNORMAL HIGH (ref 211–911)

## 2010-06-30 ENCOUNTER — Encounter: Payer: Self-pay | Admitting: Internal Medicine

## 2010-07-10 NOTE — Assessment & Plan Note (Signed)
Summary: follow up appt   Vital Signs:  Patient profile:   60 year old male Height:      68 inches Weight:      190 pounds BMI:     28.99 Temp:     98.8 degrees F oral Pulse rate:   84 / minute Pulse rhythm:   regular Resp:     16 per minute BP sitting:   100 / 72  (left arm) Cuff size:   regular  Vitals Entered By: Lanier Prude, Beverly Gust) (June 27, 2010 4:27 PM) CC: f/u  Is Patient Diabetic? Yes Comments pt is not taking Crestor, HydrocodPolst-chlor, Cefdinir or Mucinex   Primary Care Provider:  [name] MD  CC:  f/u .  History of Present Illness: The patient presents for a follow up of hypertension, diabetes, hyperlipidemia   Current Medications (verified): 1)  Norvasc 2.5 Mg Tabs (Amlodipine Besylate) .... Once Daily Qhs 2)  Glucophage 1000 Mg Tabs (Metformin Hcl) .... Two Times A Day 3)  Diovan 320 Mg Tabs (Valsartan) .... Once Daily 4)  Aspirin 81 Mg Tbec (Aspirin) .... Once Daily 5)  Ambien 5 Mg Tabs (Zolpidem Tartrate) .[name].. 1 At Bedtime Prn 6)  Vitamin C 1000 Mg Tabs (Ascorbic Acid) .... Two Times A Day 7)  Coq10 100 Mg Caps (Coenzyme Q10) .... Take 300 For Leg Aches As Needed 8)  Vitamin D3 1000 Unit  Tabs (Cholecalciferol) .[name].. 1 By Mouth Daily 9)  Crestor 40 Mg Tabs (Rosuvastatin Calcium) .[name].. 1 By Mouth Every Other Day 10)  Tramadol Hcl 50 Mg  Tabs (Tramadol Hcl) .[name].. 1-2 By Mouth Two Times A Day As Needed Pain 11)  Lorazepam 1 Mg Tabs (Lorazepam) .[name].. 1 Po Bid As Needed 12)  Aleve 220 Mg Tabs (Naproxen Sodium) .... As Needed 13)  Vitamin B-12 1000 Mcg Subl (Cyanocobalamin) .[name].. 1 By Mouth Qd 14)  Viibryd 40 Mg Tabs (Vilazodone Hcl) .[name].. 1 By Mouth Once Daily 15)  Androgel Pump 1.6 % Gel (Testosterone) .... Use 2 Pumps On Skin Q Am 16)  Livalo 4 Mg Tabs (Pitavastatin Calcium) .[name].. 1 By Mouth Qd 17)  Hydrocod Polst-Chlorphen Polst 10-8 Mg/51ml Lqcr (Hydrocod Polst-Chlorphen Polst) .... 5 Ml By Mouth Two Times A Day As Needed For Cough 18)   Cefdinir 300 Mg Caps (Cefdinir) .[name].. 1 Cap By Mouth Two Times A Day X 10 Days 19)  Mucinex 600 Mg Xr12h-Tab (Guaifenesin) .[name].. 1 Tab By Mouth Two Times A Day X 10 Days  Allergies (verified): 1)  Cymbalta 2)  Wellbutrin 3)  Effexor 4)  Fluoxetine Hcl 5)  Lipitor 6)  Simvastatin  Past History:  Past Surgical History: Stent procedure Inguinal herniorrhaphy R 2012 Dr Purnell Shoemaker  Review of Systems  The patient denies fever, syncope, peripheral edema, and abdominal pain.    Physical Exam  General:  Well-developed,well-nourished,in no acute distress; alert,appropriate and cooperative throughout examination Head:  Normocephalic and atraumatic without obvious abnormalities. No apparent alopecia or balding. Nose:  External nasal examination shows no deformity or inflammation. Nasal mucosa are pink and moist without lesions or exudates. Mouth:  Oral mucosa and oropharynx without lesions or exudates.  Teeth in good repair. mild erythema in posterior oropharynx Neck:  No deformities, masses, or tenderness noted. Lungs:  Normal respiratory effort, chest expands symmetrically. Lungs are clear to auscultation, no crackles or wheezes. Heart:  Normal rate and regular rhythm. S1 and S2 normal without gallop, murmur, click, rub or other extra sounds. Abdomen:  soft, non-tender,  normal bowel sounds, no distention, no masses, no guarding, no rigidity, no rebound tenderness, no abdominal hernia, no inguinal hernia, no hepatomegaly, and no splenomegaly.  L groin scar Msk:  No deformity or scoliosis noted of thoracic or lumbar spine.   Skin:  turgor normal, color normal, no rashes, no suspicious lesions, no ecchymoses, no petechiae, no purpura, no ulcerations, and no edema.   Psych:  Cognition and judgment appear intact. Alert and cooperative with normal attention span and concentration. No apparent delusions, illusions, hallucinations   Impression & Recommendations:  Problem # 1:  HYPERTENSION  (ICD-401.9) Assessment Unchanged  His updated medication list for this problem includes:    Norvasc 2.5 Mg Tabs (Amlodipine besylate) ..... Once daily qhs    Diovan 320 Mg Tabs (Valsartan) ..... Once daily  BP today: 100/72 Prior BP: 110/70 (05/31/2010)  Labs Reviewed: K+: 4.9 (12/02/2009) Creat: : 0.7 (12/02/2009)   Chol: 143 (12/02/2009)   HDL: 65.00 (12/02/2009)   LDL: 70 (12/02/2009)   TG: 41.0 (12/02/2009)  Problem # 2:  HYPOGONADISM (ICD-257.2) Assessment: Improved On the regimen of medicine(s) reflected in the chart    Problem # 3:  VITAMIN B12 DEFICIENCY (ICD-266.2) Assessment: Improved On the regimen of medicine(s) reflected in the chart  The labs were reviewed with the patient.   Problem # 4:  INGUINAL HERNIA, UNILATERAL (ICD-550.90) Assessment: Improved s/p repair  Complete Medication List: 1)  Norvasc 2.5 Mg Tabs (Amlodipine besylate) .... Once daily qhs 2)  Glucophage 1000 Mg Tabs (Metformin hcl) .... Two times a day 3)  Diovan 320 Mg Tabs (Valsartan) .... Once daily 4)  Aspirin 81 Mg Tbec (Aspirin) .... Once daily 5)  Ambien 5 Mg Tabs (Zolpidem tartrate) .[name].. 1 at bedtime prn 6)  Vitamin C 1000 Mg Tabs (Ascorbic acid) .... Two times a day 7)  Coq10 100 Mg Caps (Coenzyme q10) .... Take 300 for leg aches as needed 8)  Vitamin D3 1000 Unit Tabs (Cholecalciferol) .[name].. 1 by mouth daily 9)  Crestor 40 Mg Tabs (Rosuvastatin calcium) .[name].. 1 by mouth every other day 10)  Tramadol Hcl 50 Mg Tabs (Tramadol hcl) .[name].. 1-2 by mouth two times a day as needed pain 11)  Lorazepam 1 Mg Tabs (Lorazepam) .[name].. 1 po bid as needed 12)  Aleve 220 Mg Tabs (Naproxen sodium) .... As needed 13)  Vitamin B-12 1000 Mcg Subl (Cyanocobalamin) .[name].. 1 by mouth qd 14)  Viibryd 40 Mg Tabs (Vilazodone hcl) .[name].. 1 by mouth once daily 15)  Androgel Pump 1.6 % Gel (testosterone)  .... Use 2 pumps on skin q am 16)  Hydrocod Polst-chlorphen Polst 10-8 Mg/65ml Lqcr (Hydrocod polst-chlorphen polst) .... 5  ml by mouth two times a day as needed for cough 17)  Cefdinir 300 Mg Caps (Cefdinir) .[name].. 1 cap by mouth two times a day x 10 days 18)  Mucinex 600 Mg Xr12h-tab (Guaifenesin) .[name].. 1 tab by mouth two times a day x 10 days  Patient Instructions: 1)  Please schedule a follow-up appointment in 4 months. 2)  BMP prior to visit, ICD-9: 3)  HbgA1C prior to visit, ICD-9: 4)  Lipid Panel prior to visit, ICD-9:272.0 5)  aldolase 729.5 Prescriptions: NORVASC 2.5 MG TABS (AMLODIPINE BESYLATE) once daily qhs  #90 x 3   Entered and Authorized by:   [name] MD   Signed by:   [name] MD on 06/27/2010   Method used:   Electronically to        [facility] [address] (retail)       [phone]       [place], [place]  [address]       Ph: [phone]       Fax: [phone]   RxID:   [id number]    Orders Added: 1)  Est. Patient Level IV [27253]

## 2010-07-10 NOTE — Progress Notes (Signed)
Summary: [facility]: Office Visit  [facility]: Office Visit   Imported By: Earl Many 07/04/2010 17:18:12  _____________________________________________________________________  External Attachment:    Type:   Image     Comment:   External Document

## 2010-07-12 ENCOUNTER — Encounter: Payer: Self-pay | Admitting: Cardiology

## 2010-07-15 NOTE — Letter (Signed)
Summary: [facility] -Medical Clearance  [facility] -Medical Clearance   Imported By: Marylou Mccoy 07/09/2010 12:05:40  _____________________________________________________________________  External Attachment:    Type:   Image     Comment:   External Document

## 2010-07-15 NOTE — Consult Note (Signed)
Summary: Consultation Report  Consultation Report   Imported By: Earl Many 07/04/2010 11:18:14  _____________________________________________________________________  External Attachment:    Type:   Image     Comment:   External Document

## 2010-07-22 NOTE — Consult Note (Signed)
Summary: [facility]   Imported By: Sherian Rein 07/16/2010 12:30:52  _____________________________________________________________________  External Attachment:    Type:   Image     Comment:   External Document

## 2010-07-25 ENCOUNTER — Encounter: Payer: Self-pay | Admitting: Cardiovascular Disease

## 2010-07-25 ENCOUNTER — Ambulatory Visit (INDEPENDENT_AMBULATORY_CARE_PROVIDER_SITE_OTHER): Payer: BC Managed Care – PPO | Admitting: Cardiovascular Disease

## 2010-07-25 DIAGNOSIS — I10 Essential (primary) hypertension: Secondary | ICD-10-CM

## 2010-07-25 DIAGNOSIS — E119 Type 2 diabetes mellitus without complications: Secondary | ICD-10-CM

## 2010-07-25 DIAGNOSIS — I2510 Atherosclerotic heart disease of native coronary artery without angina pectoris: Secondary | ICD-10-CM

## 2010-07-25 DIAGNOSIS — E785 Hyperlipidemia, unspecified: Secondary | ICD-10-CM

## 2010-07-25 NOTE — Progress Notes (Signed)
Jared Alvarez is seen today at the request of Dr [name].  He had a stent to the LAD in 1997 done in [place]. He had a repeat procedure 5-6 months latter.  Has had myovues every 12-18 months since with no need for repeat cath.  Initial angina was like indigestion.  Active, swimming 3x/week.  Takes statin every other day due to leg pain.  Other risk factors under good control.  HbA1c usually in 6 range.   Had uncomplicated right hernia repair with Dr Abbey Chatters.    ROS: Denies fever, malais, weight loss, blurry vision, decreased visual acuity, cough, sputum, SOB, hemoptysis, pleuritic pain, palpitaitons, heartburn, abdominal pain, melena, lower extremity edema, claudication, or rash.   General:  Affect appropriate Healthy:  appears stated age HEENT: normal Neck supple with no adenopathy JVP normal no bruits no thyromegaly Lungs clear with no wheezing and good diaphragmatic motion Heart:  S1/S2 no murmur,rub, gallop or click PMI normal Abdomen: benighn, BS positve, no tenderness, no AAA no bruit.  No HSM or HJR Distal pulses intact with no bruits No edema Neuro non-focal Skin warm and dry No muscular weakness   Current Outpatient Prescriptions  Medication Sig Dispense Refill  . amLODipine (NORVASC) 2.5 MG tablet Take 2.5 mg by mouth daily.        . Ascorbic Acid (VITAMIN C) 1000 MG tablet Take 1,000 mg by mouth daily.        [name] aspirin 81 MG tablet Take 81 mg by mouth daily.        . Cholecalciferol (VITAMIN D3) 1000 UNIT/SPRAY LIQD Take by mouth daily.        . Coenzyme Q10 (COQ10) 100 MG CAPS Take by mouth as directed.        [name] LORazepam (ATIVAN) 1 MG tablet Take 1 mg by mouth as needed.        . metFORMIN (GLUCOPHAGE) 1000 MG tablet Take 1,000 mg by mouth 2 (two) times daily with a meal.        . naproxen sodium (ANAPROX) 220 MG tablet Take 220 mg by mouth as needed.        . Testosterone (ANDROGEL PUMP TD) Place onto the skin daily.        . valsartan (DIOVAN) 320 MG tablet Take 320 mg  by mouth daily.        . Vilazodone HCl 40 MG TABS Take by mouth daily. 1/2 tablet daily       . vitamin B-12 (CYANOCOBALAMIN) 100 MCG tablet Take 50 mcg by mouth daily.        [name] zolpidem (AMBIEN) 5 MG tablet Take 5 mg by mouth at bedtime as needed.        [name] DISCONTD: phenylephrine-chlorpheniramine-hydrocodone (HISTINEX HC) 5-2-2.5 MG/5ML syrup Take by mouth every 6 (six) hours as needed.        [name] DISCONTD: rosuvastatin (CRESTOR) 40 MG tablet Take 40 mg by mouth every other day.        [name] DISCONTD: traMADol (ULTRAM) 50 MG tablet Take 50 mg by mouth as needed.         Allergies  Atorvastatin; Bupropion hcl; Duloxetine; Fluoxetine hcl; Simvastatin; and Venlafaxine  Assessment and Plan

## 2010-07-25 NOTE — Assessment & Plan Note (Signed)
Stable no angina.  Continue current medical Rx

## 2010-07-25 NOTE — Patient Instructions (Signed)
Your physician recommends that you schedule a follow-up appointment in: one year  

## 2010-07-25 NOTE — Assessment & Plan Note (Signed)
F/U Dr [name].  Continue low carb diet

## 2010-07-25 NOTE — Assessment & Plan Note (Signed)
Well controlled.  Continue current medications and low sodium Dash type diet.    

## 2010-07-25 NOTE — Assessment & Plan Note (Signed)
Well controlled continue statin with known CAD Lab Results  Component Value Date   LDLCALC 119* 06/27/2010

## 2010-07-28 ENCOUNTER — Encounter: Payer: Self-pay | Admitting: Cardiovascular Disease

## 2010-11-20 ENCOUNTER — Other Ambulatory Visit: Payer: Self-pay | Admitting: Internal Medicine

## 2010-12-01 NOTE — Telephone Encounter (Signed)
Please advise regarding RF request.  

## 2010-12-03 ENCOUNTER — Other Ambulatory Visit: Payer: Self-pay | Admitting: Internal Medicine

## 2010-12-03 NOTE — Telephone Encounter (Signed)
Called in.

## 2010-12-08 ENCOUNTER — Other Ambulatory Visit: Payer: Self-pay | Admitting: Internal Medicine

## 2010-12-16 ENCOUNTER — Telehealth: Payer: Self-pay | Admitting: *Deleted

## 2010-12-16 NOTE — Telephone Encounter (Signed)
OK to fill this prescription with additional refills x5 Thank you!  

## 2010-12-16 NOTE — Telephone Encounter (Signed)
rec rf req for Zolpidem 5 mg 1 po qhs # 30.Last filled 4.10.12. Ok to Rf?

## 2010-12-17 MED ORDER — ZOLPIDEM TARTRATE 5 MG PO TABS: 5.0000 mg | ORAL_TABLET | Freq: Every evening | ORAL | Status: DC | PRN

## 2010-12-19 ENCOUNTER — Telehealth: Payer: Self-pay | Admitting: *Deleted

## 2010-12-19 NOTE — Telephone Encounter (Signed)
Spoke w/pt - he wanted to get colonoscopy before ins changed 01/2011. Informed pt that he was on recall list and not due for next colon until 07/2011

## 2011-01-29 ENCOUNTER — Ambulatory Visit (INDEPENDENT_AMBULATORY_CARE_PROVIDER_SITE_OTHER): Payer: BC Managed Care – PPO | Admitting: Physician Assistant

## 2011-01-29 ENCOUNTER — Encounter: Payer: Self-pay | Admitting: Physician Assistant

## 2011-01-29 VITALS — BP 124/80 | HR 61 | Ht 68.0 in | Wt 182.0 lb

## 2011-01-29 DIAGNOSIS — I10 Essential (primary) hypertension: Secondary | ICD-10-CM

## 2011-01-29 DIAGNOSIS — R0609 Other forms of dyspnea: Secondary | ICD-10-CM

## 2011-01-29 DIAGNOSIS — R002 Palpitations: Secondary | ICD-10-CM | POA: Insufficient documentation

## 2011-01-29 DIAGNOSIS — R0683 Snoring: Secondary | ICD-10-CM | POA: Insufficient documentation

## 2011-01-29 DIAGNOSIS — I2510 Atherosclerotic heart disease of native coronary artery without angina pectoris: Secondary | ICD-10-CM

## 2011-01-29 DIAGNOSIS — R079 Chest pain, unspecified: Secondary | ICD-10-CM | POA: Insufficient documentation

## 2011-01-29 NOTE — Assessment & Plan Note (Signed)
Atypical.  But, over 2 years since last stress test and PCI was 15 years ago.  Will obtain ETT-Myoview.  Follow up with Dr [name] in 2 months.

## 2011-01-29 NOTE — Assessment & Plan Note (Addendum)
If we ever document that he has Afib, would pursue sleep study.  At this point, symptoms not suggestive of sleep apnea.

## 2011-01-29 NOTE — Assessment & Plan Note (Signed)
Continue ASA.  Get Myoview as noted.

## 2011-01-29 NOTE — Assessment & Plan Note (Signed)
Controlled.  

## 2011-01-29 NOTE — Progress Notes (Signed)
History of Present Illness: Primary Cardiologist:  Dr. Charlton Haws  PCP:  Dr. Marcy Salvo E Jared Alvarez is a 60 y.o. male presents for evaluation of chest pain and palpitations.  He has a h/o CAD, s/p stent to the LAD in 1997 in [place].  Also has DM2, HTN, HLP Gout, depression, anxiety, hypogonadism.  He established with Dr. [name] in 3/12.  He was to follow up in one year.  Recently he has noticed palpitations.  They are rapid and last several seconds.  They occur at any time.  No aggravating or alleviating factors.  May happen twice a week.  Not sure if irregular.  This past weekend he noted some "sharp twinges" in his chest.  It felt worse than MSK pain.  He was active at the time.  Not like previous angina.  No radiation, associated dyspnea, nausea or diaphoresis.  No recurrence.  Pain lasted a few seconds.  No stress test in over 2 years.  Denies syncope.  Denies orthopnea, PND, edema.    Past Medical History  Diagnosis Date  . Hypertension   . Hyperlipidemia   . Coronary artery disease   . Hearing loss   . Skin neoplasm   . Tinnitus   . Vertigo   . LBP (low back pain)   . Leg cramps   . Osteoarthritis   . DM type 2 (diabetes mellitus, type 2)   . Gout   . Depression   . Anxiety   . Vitamin B12 deficiency   . Hypogonadism male   . Inguinal hernia     right    Current Outpatient Prescriptions  Medication Sig Dispense Refill  . amLODipine (NORVASC) 2.5 MG tablet Take 2.5 mg by mouth daily.        . Ascorbic Acid (VITAMIN C) 1000 MG tablet Take 1,000 mg by mouth daily.        [name] aspirin 81 MG tablet Take 81 mg by mouth daily.        [name] FLUoxetine (PROZAC) 20 MG capsule TAKE ONE CAPSULE BY MOUTH EVERY DAY  90 capsule  1  . LORazepam (ATIVAN) 1 MG tablet TAKE ONE TABLET BY MOUTH TWICE DAILY AS NEEDED  60 tablet  0  . metFORMIN (GLUCOPHAGE) 1000 MG tablet Take 1,000 mg by mouth 2 (two) times daily with a meal.        . naproxen sodium (ANAPROX) 220 MG tablet Take 220 mg by mouth  as needed.        . valsartan (DIOVAN) 320 MG tablet Take 320 mg by mouth daily.        . Vilazodone HCl 40 MG TABS Take by mouth daily. 1/2 tablet daily       . vitamin B-12 (CYANOCOBALAMIN) 100 MCG tablet Take 50 mcg by mouth daily.        [name] zolpidem (AMBIEN) 5 MG tablet Take 1 tablet (5 mg total) by mouth at bedtime as needed.  30 tablet  5  . Cholecalciferol (VITAMIN D3) 1000 UNIT/SPRAY LIQD Take by mouth daily.        . Coenzyme Q10 (COQ10) 100 MG CAPS Take by mouth as directed.        . Testosterone (ANDROGEL PUMP TD) Place onto the skin daily.          Allergies: Allergies  Allergen Reactions  . Atorvastatin     REACTION: pains  . Bupropion Hcl     REACTION: side effects  . Duloxetine  REACTION: dizzy  . Fluoxetine Hcl     REACTION: withdrawl  . Simvastatin     REACTION: pain  . Venlafaxine     REACTION: side effects    Social Hx:  Non smoker  ROS:  Please see the history of present illness.  Notes frequent urination recently, no dysuria or urgency.  Feels tired.  Does snore.  No daytime hypersomnolence.  Otherwise, all other systems reviewed and negative.   Vital Signs: BP 124/80  Pulse 61  Ht 5\' 8"  (1.727 m)  Wt 182 lb (82.555 kg)  BMI 27.67 kg/m2  PHYSICAL EXAM: Well nourished, well developed, in no acute distress HEENT: normal Neck: no JVD Vascular: no carotid bruits Cardiac:  normal S1, S2; RRR; no murmur Lungs:  clear to auscultation bilaterally, no wheezing, rhonchi or rales Abd: soft, nontender, no hepatomegaly Ext: no edema Skin: warm and dry Neuro:  CNs 2-12 intact, no focal abnormalities noted Psych: Normal affect  EKG:  NSR, HR 62, normal axis, NSSTTW changes  ASSESSMENT AND PLAN:

## 2011-01-29 NOTE — Assessment & Plan Note (Signed)
Managed by PCP.  Continue Crestor.

## 2011-01-29 NOTE — Patient Instructions (Addendum)
Your physician recommends that you schedule a follow-up appointment in: 2 months with Dr. [name]. Your physician recommends that you return for lab work in: Today,BMET, TSH. Your physician has recommended that you wear an event monitor.Your physician has requested that you have an echocardiogram. Echocardiography is a painless test that uses sound waves to create images of your heart. It provides your doctor with information about the size and shape of your heart and how well your heart's chambers and valves are working. This procedure takes approximately one hour. There are no restrictions for this procedure. Your physician has requested that you have en exercise stress myoview. For further information please visit [email or web address] Please follow instruction sheet, as given.

## 2011-01-29 NOTE — Assessment & Plan Note (Addendum)
Description somewhat suspicious for AFib.  Will get bmet and TSH.  Check Echo to assess LVF and cardiac structure.  I recommended proceeding with an event monitor.  However, the patient refused this at this time.  We discussed the importance of doing this test and he understands.  Follow up with Dr. [name] in 2 months.

## 2011-01-30 LAB — BASIC METABOLIC PANEL
BUN: 15 mg/dL (ref 6–23)
CO2: 27 mEq/L (ref 19–32)
Calcium: 10 mg/dL (ref 8.4–10.5)
Glucose, Bld: 132 mg/dL — ABNORMAL HIGH (ref 70–99)
Sodium: 144 mEq/L (ref 135–145)

## 2011-02-04 ENCOUNTER — Encounter: Payer: Self-pay | Admitting: *Deleted

## 2011-02-12 ENCOUNTER — Ambulatory Visit (HOSPITAL_COMMUNITY): Payer: BC Managed Care – PPO | Attending: Cardiovascular Disease | Admitting: Radiology

## 2011-02-12 ENCOUNTER — Ambulatory Visit (HOSPITAL_BASED_OUTPATIENT_CLINIC_OR_DEPARTMENT_OTHER): Payer: BC Managed Care – PPO | Admitting: Radiology

## 2011-02-12 DIAGNOSIS — I2510 Atherosclerotic heart disease of native coronary artery without angina pectoris: Secondary | ICD-10-CM | POA: Insufficient documentation

## 2011-02-12 DIAGNOSIS — E119 Type 2 diabetes mellitus without complications: Secondary | ICD-10-CM

## 2011-02-12 DIAGNOSIS — R002 Palpitations: Secondary | ICD-10-CM

## 2011-02-12 DIAGNOSIS — I4949 Other premature depolarization: Secondary | ICD-10-CM

## 2011-02-12 DIAGNOSIS — R079 Chest pain, unspecified: Secondary | ICD-10-CM

## 2011-02-12 DIAGNOSIS — I10 Essential (primary) hypertension: Secondary | ICD-10-CM | POA: Insufficient documentation

## 2011-02-12 DIAGNOSIS — E785 Hyperlipidemia, unspecified: Secondary | ICD-10-CM | POA: Insufficient documentation

## 2011-02-12 DIAGNOSIS — R072 Precordial pain: Secondary | ICD-10-CM | POA: Insufficient documentation

## 2011-02-12 MED ORDER — TECHNETIUM TC 99M TETROFOSMIN IV KIT
11.0000 | PACK | Freq: Once | INTRAVENOUS | Status: AC | PRN
  Administered 2011-02-12: 11 via INTRAVENOUS

## 2011-02-12 MED ORDER — TECHNETIUM TC 99M TETROFOSMIN IV KIT
33.0000 | PACK | Freq: Once | INTRAVENOUS | Status: AC | PRN
  Administered 2011-02-12: 33 via INTRAVENOUS

## 2011-02-12 NOTE — Progress Notes (Signed)
[facility] SITE 3 NUCLEAR MED [address] [place] [address] [phone]  Cardiology Nuclear Med Study  Jared Alvarez is a 60 y.o. male [id number] 03-25-1951   Nuclear Med Background Indication for Stress Test:  Evaluation for Ischemia and Stent Patency History:  '97 Stent-LAD; '10 [id number]:[id number] per patient Cardiac Risk Factors: Family History - CAD, Hypertension, Lipids and NIDDM  Symptoms:  Chest Pain, Dizziness, Fatigue and Palpitations   Nuclear Pre-Procedure Caffeine/Decaff Intake:  None NPO After: 8:00pm   Lungs:  - IV 0.9% NS with Angio Cath:  20g  IV Site: R Antecubital x 1, tolerated well  IV Started by:  Irean Hong, RN  Chest Size (in):  44 Cup Size: n/a  Height: 5\' 8"  (1.727 m)  Weight:  181 lb (82.101 kg)  BMI:  Body mass index is 27.52 kg/(m^2). Tech Comments:  Held metformin this am    Nuclear Med Study 1 or 2 day study: 1 day  Stress Test Type:  Stress  Reading MD: Olga Millers, MD  Order Authorizing Provider:  Charlton Haws, MD  Resting Radionuclide: Technetium 63m Tetrofosmin  Resting Radionuclide Dose: 11.0 mCi   Stress Radionuclide:  Technetium 25m Tetrofosmin  Stress Radionuclide Dose: 33.0 mCi           Stress Protocol Rest HR: 48 Stress HR: 157  Rest BP: 134/68 Stress BP: 195/93  Exercise Time (min): 10:00 METS: 11.7   Predicted Max HR: 160 bpm % Max HR: 98.12 bpm Rate Pressure Product: 64403   Dose of Adenosine (mg):  n/a Dose of Lexiscan: n/a mg  Dose of Atropine (mg): n/a Dose of Dobutamine: n/a mcg/kg/min (at max HR)  Stress Test Technologist: Milana Na, EMT-P  Nuclear Technologist:  Domenic Polite, CNMT     Rest Procedure:  Myocardial perfusion imaging was performed at rest 45 minutes following the intravenous administration of Technetium 64m Tetrofosmin.  Rest ECG: Sinus bradycardia.  Stress Procedure:  The patient exercised for ten minutes on the treadmill utilizing the Bruce protocol.  The  patient stopped due to fatigue and denied any chest pain.  There were nonspecific ST-T wave changes and occasional PVC's.  Technetium 30m Tetrofosmin was injected at peak exercise and myocardial perfusion imaging was performed after a brief delay.  Stress ECG: Insignificant upsloping ST segment depression.  QPS Raw Data Images:  Acquisition technically good; normal left ventricular size. Stress Images:  Normal homogeneous uptake in all areas of the myocardium. Rest Images:  Normal homogeneous uptake in all areas of the myocardium. Subtraction (SDS):  No evidence of ischemia. Transient Ischemic Dilatation (Normal <1.22):  0.82 Lung/Heart Ratio (Normal <0.45):  0.22  Quantitative Gated Spect Images QGS EDV:  106 ml QGS ESV:  46 ml QGS cine images:  NL LV Function; NL Wall Motion QGS EF: 56%  Impression Exercise Capacity:  Good exercise capacity. BP Response:  Normal blood pressure response. Clinical Symptoms:  No chest pain. ECG Impression:  Insignificant upsloping ST segment depression. Comparison with Prior Nuclear Study: No significant change from previous study  Overall Impression:  Normal stress nuclear study.   Olga Millers

## 2011-02-16 ENCOUNTER — Other Ambulatory Visit: Payer: Self-pay | Admitting: Cardiovascular Disease

## 2011-02-22 ENCOUNTER — Other Ambulatory Visit: Payer: Self-pay | Admitting: Internal Medicine

## 2011-02-26 ENCOUNTER — Other Ambulatory Visit: Payer: Self-pay | Admitting: Internal Medicine

## 2011-02-27 NOTE — Telephone Encounter (Signed)
OK to refill

## 2011-03-05 ENCOUNTER — Telehealth: Payer: Self-pay | Admitting: *Deleted

## 2011-03-05 MED ORDER — LORAZEPAM 1 MG PO TABS: 1.0000 mg | ORAL_TABLET | Freq: Two times a day (BID) | ORAL | Status: DC | PRN

## 2011-03-05 NOTE — Telephone Encounter (Signed)
Rf req for Lorazepam 1 mg 1 po bid prn # 60. Last filled 12/04/2010.  Last Ov 06/2010. Ok to Rf?

## 2011-03-05 NOTE — Telephone Encounter (Signed)
OK to fill this prescription with additional refills x3 Thank you!  

## 2011-03-19 ENCOUNTER — Ambulatory Visit: Payer: BC Managed Care – PPO | Admitting: Cardiovascular Disease

## 2011-03-24 ENCOUNTER — Ambulatory Visit: Payer: BC Managed Care – PPO | Admitting: Cardiovascular Disease

## 2011-04-14 ENCOUNTER — Other Ambulatory Visit: Payer: Self-pay | Admitting: Internal Medicine

## 2011-05-21 ENCOUNTER — Other Ambulatory Visit (INDEPENDENT_AMBULATORY_CARE_PROVIDER_SITE_OTHER): Payer: BC Managed Care – PPO

## 2011-05-21 ENCOUNTER — Encounter: Payer: Self-pay | Admitting: Internal Medicine

## 2011-05-21 ENCOUNTER — Ambulatory Visit (INDEPENDENT_AMBULATORY_CARE_PROVIDER_SITE_OTHER): Payer: BC Managed Care – PPO | Admitting: Internal Medicine

## 2011-05-21 VITALS — BP 110/80 | HR 76 | Temp 98.5°F | Resp 16 | Wt 183.0 lb

## 2011-05-21 DIAGNOSIS — M109 Gout, unspecified: Secondary | ICD-10-CM

## 2011-05-21 DIAGNOSIS — E538 Deficiency of other specified B group vitamins: Secondary | ICD-10-CM

## 2011-05-21 DIAGNOSIS — M545 Low back pain: Secondary | ICD-10-CM

## 2011-05-21 DIAGNOSIS — E119 Type 2 diabetes mellitus without complications: Secondary | ICD-10-CM

## 2011-05-21 DIAGNOSIS — F411 Generalized anxiety disorder: Secondary | ICD-10-CM

## 2011-05-21 DIAGNOSIS — F329 Major depressive disorder, single episode, unspecified: Secondary | ICD-10-CM

## 2011-05-21 DIAGNOSIS — E785 Hyperlipidemia, unspecified: Secondary | ICD-10-CM

## 2011-05-21 DIAGNOSIS — E291 Testicular hypofunction: Secondary | ICD-10-CM

## 2011-05-21 DIAGNOSIS — I2510 Atherosclerotic heart disease of native coronary artery without angina pectoris: Secondary | ICD-10-CM

## 2011-05-21 LAB — CBC WITH DIFFERENTIAL/PLATELET
Basophils Relative: 0.3 % (ref 0.0–3.0)
Eosinophils Relative: 0.9 % (ref 0.0–5.0)
HCT: 44 % (ref 39.0–52.0)
Hemoglobin: 15.1 g/dL (ref 13.0–17.0)
Lymphs Abs: 1.4 10*3/uL (ref 0.7–4.0)
Monocytes Relative: 6.6 % (ref 3.0–12.0)
Neutro Abs: 7.2 10*3/uL (ref 1.4–7.7)
RBC: 4.61 Mil/uL (ref 4.22–5.81)
RDW: 13.1 % (ref 11.5–14.6)
WBC: 9.3 10*3/uL (ref 4.5–10.5)

## 2011-05-21 LAB — BASIC METABOLIC PANEL
GFR: 112.69 mL/min (ref >60.00)
Glucose, Bld: 121 mg/dL — ABNORMAL HIGH (ref 70–99)
Potassium: 4.3 mEq/L (ref 3.5–5.1)
Sodium: 138 mEq/L (ref 135–145)

## 2011-05-21 LAB — URINALYSIS
Bilirubin Urine: NEGATIVE
Hgb urine dipstick: NEGATIVE
Ketones, ur: NEGATIVE
Urine Glucose: NEGATIVE
Urobilinogen, UA: 0.2 (ref 0.0–1.0)

## 2011-05-21 LAB — VITAMIN B12: Vitamin B-12: 898 pg/mL (ref 211–911)

## 2011-05-21 LAB — TSH: TSH: 1.16 u[IU]/mL (ref 0.35–5.50)

## 2011-05-21 MED ORDER — LORAZEPAM 1 MG PO TABS: 1.0000 mg | ORAL_TABLET | Freq: Two times a day (BID) | ORAL | Status: DC | PRN

## 2011-05-21 MED ORDER — FLUOXETINE HCL 20 MG PO CAPS: 20.0000 mg | ORAL_CAPSULE | Freq: Every day | ORAL | Status: DC

## 2011-05-21 MED ORDER — METFORMIN HCL 1000 MG PO TABS: 1000.0000 mg | ORAL_TABLET | Freq: Two times a day (BID) | ORAL | Status: DC

## 2011-05-21 MED ORDER — VALSARTAN 320 MG PO TABS: 320.0000 mg | ORAL_TABLET | Freq: Every day | ORAL | Status: DC

## 2011-05-21 MED ORDER — ROSUVASTATIN CALCIUM 40 MG PO TABS: 40.0000 mg | ORAL_TABLET | Freq: Every day | ORAL | Status: DC

## 2011-05-21 MED ORDER — ZOLPIDEM TARTRATE 5 MG PO TABS: 5.0000 mg | ORAL_TABLET | Freq: Every evening | ORAL | Status: DC | PRN

## 2011-05-21 MED ORDER — VILAZODONE HCL 40 MG PO TABS: 20.0000 mg | ORAL_TABLET | Freq: Every day | ORAL | Status: DC

## 2011-05-21 MED ORDER — AMLODIPINE BESYLATE 2.5 MG PO TABS: 2.5000 mg | ORAL_TABLET | Freq: Every day | ORAL | Status: DC

## 2011-05-21 NOTE — Assessment & Plan Note (Signed)
Continue with current prescription therapy as reflected on the Med list.  

## 2011-05-21 NOTE — Assessment & Plan Note (Signed)
Chronic   Potential benefits of a long term testosterone  use as well as potential risks  and complications were explained to the patient and were aknowledged. Continue with current prescription therapy as reflected on the Med list.

## 2011-05-21 NOTE — Assessment & Plan Note (Signed)
Check labs 

## 2011-05-21 NOTE — Progress Notes (Signed)
  Subjective:    Patient ID: Jared Alvarez, male    DOB: 01/12/1951, 61 y.o.   MRN: [id number]  HPI  The patient presents for a follow-up of  chronic hypertension, chronic dyslipidemia, type 2 diabetes controlled with medicines    Review of Systems  Constitutional: Negative for appetite change, fatigue and unexpected weight change.  HENT: Negative for nosebleeds, congestion, sore throat, sneezing, trouble swallowing and neck pain.   Eyes: Negative for itching and visual disturbance.  Respiratory: Negative for cough.   Cardiovascular: Negative for chest pain, palpitations and leg swelling.  Gastrointestinal: Negative for nausea, diarrhea, blood in stool and abdominal distention.  Genitourinary: Negative for frequency and hematuria.  Musculoskeletal: Negative for back pain, joint swelling and gait problem.  Skin: Negative for rash.  Neurological: Negative for dizziness, tremors, speech difficulty and weakness.  Psychiatric/Behavioral: Negative for sleep disturbance, dysphoric mood and agitation. The patient is not nervous/anxious.    Wt Readings from Last 3 Encounters:  05/21/11 183 lb (83.008 kg)  02/12/11 181 lb (82.101 kg)  01/29/11 182 lb (82.555 kg)       Objective:   Physical Exam  Constitutional: He is oriented to person, place, and time. He appears well-developed.  HENT:  Mouth/Throat: Oropharynx is clear and moist.  Eyes: Conjunctivae are normal. Pupils are equal, round, and reactive to light.  Neck: Normal range of motion. No JVD present. No thyromegaly present.  Cardiovascular: Normal rate, regular rhythm, normal heart sounds and intact distal pulses.  Exam reveals no gallop and no friction rub.   No murmur heard. Pulmonary/Chest: Effort normal and breath sounds normal. No respiratory distress. He has no wheezes. He has no rales. He exhibits no tenderness.  Abdominal: Soft. Bowel sounds are normal. He exhibits no distension and no mass. There is no tenderness. There  is no rebound and no guarding.  Musculoskeletal: Normal range of motion. He exhibits no edema and no tenderness.  Lymphadenopathy:    He has no cervical adenopathy.  Neurological: He is alert and oriented to person, place, and time. He has normal reflexes. No cranial nerve deficit. He exhibits normal muscle tone. Coordination normal.  Skin: Skin is warm and dry. No rash noted.  Psychiatric: He has a normal mood and affect. His behavior is normal. Judgment and thought content normal.          Assessment & Plan:

## 2011-07-01 ENCOUNTER — Encounter: Payer: Self-pay | Admitting: Gastroenterology

## 2011-08-26 ENCOUNTER — Telehealth: Payer: Self-pay | Admitting: Cardiovascular Disease

## 2011-08-26 NOTE — Telephone Encounter (Signed)
New Problem:     I called the patient and was unable to reach them. I left a message on their voicemail with my name, the reason I called, the name of his physician, and a number to call back to schedule their appointment. 

## 2011-09-22 ENCOUNTER — Encounter: Payer: Self-pay | Admitting: Internal Medicine

## 2011-10-22 ENCOUNTER — Encounter: Payer: Self-pay | Admitting: Cardiovascular Disease

## 2011-10-22 ENCOUNTER — Ambulatory Visit (INDEPENDENT_AMBULATORY_CARE_PROVIDER_SITE_OTHER): Payer: BC Managed Care – PPO | Admitting: Cardiovascular Disease

## 2011-10-22 VITALS — BP 118/68 | HR 60 | Ht 68.0 in | Wt 185.1 lb

## 2011-10-22 DIAGNOSIS — E785 Hyperlipidemia, unspecified: Secondary | ICD-10-CM

## 2011-10-22 DIAGNOSIS — I2510 Atherosclerotic heart disease of native coronary artery without angina pectoris: Secondary | ICD-10-CM

## 2011-10-22 DIAGNOSIS — E119 Type 2 diabetes mellitus without complications: Secondary | ICD-10-CM

## 2011-10-22 DIAGNOSIS — I10 Essential (primary) hypertension: Secondary | ICD-10-CM

## 2011-10-22 NOTE — Assessment & Plan Note (Signed)
Cholesterol is at goal.  Continue current dose of statin and diet Rx.  No myalgias or side effects.  F/U  LFT's in 6 months. Lab Results  Component Value Date   LDLCALC 119* 06/27/2010             

## 2011-10-22 NOTE — Patient Instructions (Signed)
Your physician wants you to follow-up in: YEAR WITH DR NISHAN  You will receive a reminder letter in the mail two months in advance. If you don't receive a letter, please call our office to schedule the follow-up appointment.  Your physician recommends that you continue on your current medications as directed. Please refer to the Current Medication list given to you today. 

## 2011-10-22 NOTE — Progress Notes (Signed)
Patient ID: Jared Alvarez, male   DOB: Jun 25, 1950, 61 y.o.   MRN: [id number] Jared Alvarez is a 61 y.o. male presents for evaluation of chest pain and palpitations. He has a h/o CAD, s/p stent to the LAD in 1997 in [place]. Also has DM2, HTN, HLP Gout, depression, anxiety, hypogonadism. He established with me  in 3/12.  Saw PA last October with chest pain and had benign myovue and echo  Some stress running [employer] but otherwise doing well  Taking crestor 3-4 x/week and wonders if it is affecting his memory.    Last myovue :  02/12/11 reviewed  Normal no ishcemia EF 56% Echo:  02/12/11 reviewed Study Conclusions  - Left ventricle: The cavity size was normal. Wall thickness was increased in a pattern of mild LVH. Systolic function was normal. The estimated ejection fraction was in the range of 55% to 60%. Wall motion was normal; there were no regional wall motion abnormalities. - Aortic valve: Trivial regurgitation. - Left atrium: The atrium was mildly dilated. - Pulmonary arteries: Systolic pressure was mildly increased. PA peak pressure: 33mm Hg (S).    ROS: Denies fever, malais, weight loss, blurry vision, decreased visual acuity, cough, sputum, SOB, hemoptysis, pleuritic pain, palpitaitons, heartburn, abdominal pain, melena, lower extremity edema, claudication, or rash.  All other systems reviewed and negative  General: Affect appropriate Healthy:  appears stated age HEENT: normal Neck supple with no adenopathy JVP normal no bruits no thyromegaly Lungs clear with no wheezing and good diaphragmatic motion Heart:  S1/S2 no murmur, no rub, gallop or click PMI normal Abdomen: benighn, BS positve, no tenderness, no AAA no bruit.  No HSM or HJR Distal pulses intact with no bruits No edema Neuro non-focal Skin warm and dry No muscular weakness   Current Outpatient Prescriptions  Medication Sig Dispense Refill  . amLODipine (NORVASC) 2.5 MG tablet Take 1 tablet (2.5 mg  total) by mouth daily.  90 tablet  0  . Ascorbic Acid (VITAMIN C) 1000 MG tablet Take 1,000 mg by mouth daily.        [name] aspirin 81 MG tablet Take 81 mg by mouth daily.        . colchicine 0.6 MG tablet Take 0.6 mg by mouth as needed. If patient has flare up with gout      . FLUoxetine (PROZAC) 20 MG capsule Take 1 capsule (20 mg total) by mouth daily.  90 capsule  3  . LORazepam (ATIVAN) 1 MG tablet Take 1 tablet (1 mg total) by mouth 2 (two) times daily as needed for anxiety.  180 tablet  1  . metFORMIN (GLUCOPHAGE) 1000 MG tablet Take 1 tablet (1,000 mg total) by mouth 2 (two) times daily with a meal.  180 tablet  0  . rosuvastatin (CRESTOR) 40 MG tablet Take 40 mg by mouth daily. 40 mg every other day      . Testosterone (ANDROGEL PUMP TD) Place onto the skin as needed.       . valsartan (DIOVAN) 320 MG tablet Take 1 tablet (320 mg total) by mouth daily.  90 tablet  3  . Vilazodone HCl (VIIBRYD) 40 MG TABS Take 0.5 tablets (20 mg total) by mouth daily. 1/2 tablet daily  30 tablet  11  . Vitamin B1-B12 5.5-0.0125 MG/5ML SOLN Take by mouth as needed.       . zolpidem (AMBIEN) 5 MG tablet Take 2.5 mg by mouth at bedtime as needed.      [name]  DISCONTD: zolpidem (AMBIEN) 5 MG tablet Take 1 tablet (5 mg total) by mouth at bedtime as needed.  90 tablet  1  . DISCONTD: rosuvastatin (CRESTOR) 40 MG tablet Take 1 tablet (40 mg total) by mouth daily.  30 tablet  11    Allergies  Atorvastatin; Bupropion hcl; Duloxetine; Fluoxetine hcl; Simvastatin; and Venlafaxine  Electrocardiogram: 01/29/11 NSR rate 61 nonspecific ST/T wave changes   Assessment and Plan

## 2011-10-22 NOTE — Assessment & Plan Note (Signed)
Discussed low carb diet.  Target hemoglobin A1c is 6.5 or less.  Continue current medications.  

## 2011-10-22 NOTE — Assessment & Plan Note (Signed)
Stable with no angina and good activity level.  Continue medical Rx  

## 2011-10-22 NOTE — Assessment & Plan Note (Signed)
Well controlled.  Continue current medications and low sodium Dash type diet.    

## 2011-10-30 ENCOUNTER — Encounter: Payer: BC Managed Care – PPO | Admitting: Internal Medicine

## 2011-11-09 ENCOUNTER — Other Ambulatory Visit: Payer: Self-pay | Admitting: Internal Medicine

## 2011-11-11 ENCOUNTER — Encounter: Payer: Self-pay | Admitting: Internal Medicine

## 2011-11-11 ENCOUNTER — Ambulatory Visit (AMBULATORY_SURGERY_CENTER): Payer: BC Managed Care – PPO | Admitting: *Deleted

## 2011-11-11 VITALS — Ht 68.0 in | Wt 186.2 lb

## 2011-11-11 DIAGNOSIS — Z1211 Encounter for screening for malignant neoplasm of colon: Secondary | ICD-10-CM

## 2011-11-11 MED ORDER — PEG-KCL-NACL-NASULF-NA ASC-C 100 G PO SOLR: ORAL | Status: DC

## 2011-11-18 ENCOUNTER — Encounter: Payer: Self-pay | Admitting: Internal Medicine

## 2011-11-18 ENCOUNTER — Ambulatory Visit (AMBULATORY_SURGERY_CENTER): Payer: BC Managed Care – PPO | Admitting: Internal Medicine

## 2011-11-18 VITALS — BP 142/77 | HR 59 | Temp 95.4°F | Resp 12 | Ht 68.0 in | Wt 186.0 lb

## 2011-11-18 DIAGNOSIS — D126 Benign neoplasm of colon, unspecified: Secondary | ICD-10-CM

## 2011-11-18 DIAGNOSIS — Z1211 Encounter for screening for malignant neoplasm of colon: Secondary | ICD-10-CM

## 2011-11-18 LAB — GLUCOSE, CAPILLARY: Glucose-Capillary: 126 mg/dL — ABNORMAL HIGH (ref 70–99)

## 2011-11-18 MED ORDER — SODIUM CHLORIDE 0.9 % IV SOLN: 500.0000 mL | INTRAVENOUS | Status: DC

## 2011-11-18 NOTE — Op Note (Signed)
[facility] [address] [place], [place]  [address]  COLONOSCOPY PROCEDURE REPORT  PATIENT:  Jared Alvarez, Jared Alvarez  MR#:  [id number] BIRTHDATE:  1950/08/09, 61 yrs. old  GENDER:  male ENDOSCOPIST:  Wilhemina Bonito. Eda Keys, MD REF. BY:  Screening / Recall PROCEDURE DATE:  11/18/2011 PROCEDURE:  Colonoscopy with snare polypectomy x 5 ASA CLASS:  Class II INDICATIONS:  Routine Risk Screening (negative index exam 2003) MEDICATIONS:   MAC sedation, administered by CRNA, propofol (Diprivan) 350 mg IV  DESCRIPTION OF PROCEDURE:   After the risks benefits and alternatives of the procedure were thoroughly explained, informed consent was obtained.  Digital rectal exam was performed and revealed no abnormalities.   The LB CF-[id number] endoscope was introduced through the anus and advanced to the cecum, which was identified by both the appendix and ileocecal valve, without limitations.  The quality of the prep was excellent, using MoviPrep.  The instrument was then slowly withdrawn as the colon was fully examined. <<PROCEDUREIMAGES>>  FINDINGS:  Five polyps, ranging from 2-27mm,  were found in the cecum, ascending (2), transverse and descending colon. Polyps were snared without cautery. Retrieval was successful.  Moderate diverticulosis was found in the sigmoid colon. Otherwise normal exam.  Retroflexed views in the rectum revealed internal hemorrhoids.    The time to cecum = 3:26  minutes. The scope was then withdrawn in  20:06  minutes from the cecum and the procedure completed.  COMPLICATIONS:  None ENDOSCOPIC IMPRESSION: 1) Five polyps in the colon - removed 2) Moderate diverticulosis in the sigmoid colon 3) Internal hemorrhoids  RECOMMENDATIONS: 1) Repeat Colonoscopy in 3 years.  ______________________________ Wilhemina Bonito. Eda Keys, MD  CC:  Linda Hedges. Plotnikov, MD;   The Patient  n. eSIGNED:   Wilhemina Bonito. Eda Keys at 11/18/2011 02:23 PM  Sidney, Maisie Fus, [phone]

## 2011-11-18 NOTE — Patient Instructions (Addendum)
YOU HAD AN ENDOSCOPIC PROCEDURE TODAY AT THE [facility]: Refer to the procedure report that was given to you for any specific questions about what was found during the examination.  If the procedure report does not answer your questions, please call your gastroenterologist to clarify.  If you requested that your care partner not be given the details of your procedure findings, then the procedure report has been included in a sealed envelope for you to review at your convenience later.  YOU SHOULD EXPECT: Some feelings of bloating in the abdomen. Passage of more gas than usual.  Walking can help get rid of the air that was put into your GI tract during the procedure and reduce the bloating. If you had a lower endoscopy (such as a colonoscopy or flexible sigmoidoscopy) you may notice spotting of blood in your stool or on the toilet paper. If you underwent a bowel prep for your procedure, then you may not have a normal bowel movement for a few days.  DIET: Your first meal following the procedure should be a light meal and then it is ok to progress to your normal diet.  A half-sandwich or bowl of soup is an example of a good first meal.  Heavy or fried foods are harder to digest and may make you feel nauseous or bloated.  Likewise meals heavy in dairy and vegetables can cause extra gas to form and this can also increase the bloating.  Drink plenty of fluids but you should avoid alcoholic beverages for 24 hours.  ACTIVITY: Your care partner should take you home directly after the procedure.  You should plan to take it easy, moving slowly for the rest of the day.  You can resume normal activity the day after the procedure however you should NOT DRIVE or use heavy machinery for 24 hours (because of the sedation medicines used during the test).    SYMPTOMS TO REPORT IMMEDIATELY: A gastroenterologist can be reached at any hour.  During normal business hours, 8:30 AM to 5:00 PM Monday through Friday,  call [phone].  After hours and on weekends, please call the GI answering service at [phone] who will take a message and have the physician on call contact you.   Following lower endoscopy (colonoscopy or flexible sigmoidoscopy):  Excessive amounts of blood in the stool  Significant tenderness or worsening of abdominal pains  Swelling of the abdomen that is new, acute  Fever of 100F or higher  FOLLOW UP: If any biopsies were taken you will be contacted by phone or by letter within the next 1-3 weeks.  Call your gastroenterologist if you have not heard about the biopsies in 3 weeks.  Our staff will call the home number listed on your records the next business day following your procedure to check on you and address any questions or concerns that you may have at that time regarding the information given to you following your procedure. This is a courtesy call and so if there is no answer at the home number and we have not heard from you through the emergency physician on call, we will assume that you have returned to your regular daily activities without incident.  SIGNATURES/CONFIDENTIALITY: You and/or your care partner have signed paperwork which will be entered into your electronic medical record.  These signatures attest to the fact that that the information above on your After Visit Summary has been reviewed and is understood.  Full responsibility of the confidentiality of this   discharge information lies with you and/or your care-partner.   Ok to resume your normal medications 

## 2011-11-18 NOTE — Progress Notes (Addendum)
Pt's heart rate 43-48 in the recovery room.  Pt states he normally runs a low heart rate.  Heart rate in the 50's during the procedure.  Patient did not experience any of the following events: a burn prior to discharge; a fall within the facility; wrong site/side/patient/procedure/implant event; or a hospital transfer or hospital admission upon discharge from the facility. [phone]) Patient did not have preoperative order for IV antibiotic SSI prophylaxis. [phone])

## 2011-11-18 NOTE — Progress Notes (Signed)
Propofol per k rogers crna. meds titrated per crna throughout entire procedure. See scanned intra procedure report. ewm

## 2011-11-19 ENCOUNTER — Telehealth: Payer: Self-pay

## 2011-11-19 NOTE — Telephone Encounter (Signed)
  Follow up Call-  Call back number 11/18/2011  Post procedure Call Back phone  # [phone]  Permission to leave phone message Yes     Patient questions:  Do you have a fever, pain , or abdominal swelling? no Pain Score  0 *  Have you tolerated food without any problems? yes  Have you been able to return to your normal activities? yes  Do you have any questions about your discharge instructions: Diet   no Medications  no Follow up visit  no  Do you have questions or concerns about your Care? no  Actions: * If pain score is 4 or above: No action needed, pain <4.

## 2011-11-25 ENCOUNTER — Encounter: Payer: Self-pay | Admitting: Internal Medicine

## 2011-12-07 ENCOUNTER — Telehealth: Payer: Self-pay

## 2011-12-07 NOTE — Telephone Encounter (Signed)
OK to fill this prescription with additional refills x3 Thank you!  

## 2011-12-07 NOTE — Telephone Encounter (Signed)
Pharmacy called stating pt presented with Ativan Rx written 01/17. Rx expires after 6 mths. Pt is requesting new Rx, please advise.

## 2011-12-08 MED ORDER — LORAZEPAM 1 MG PO TABS: 1.0000 mg | ORAL_TABLET | Freq: Two times a day (BID) | ORAL | Status: DC | PRN

## 2011-12-08 NOTE — Telephone Encounter (Signed)
Done

## 2011-12-15 ENCOUNTER — Encounter: Payer: Self-pay | Admitting: Internal Medicine

## 2011-12-15 ENCOUNTER — Ambulatory Visit (INDEPENDENT_AMBULATORY_CARE_PROVIDER_SITE_OTHER): Payer: BC Managed Care – PPO | Admitting: Internal Medicine

## 2011-12-15 ENCOUNTER — Other Ambulatory Visit (INDEPENDENT_AMBULATORY_CARE_PROVIDER_SITE_OTHER): Payer: BC Managed Care – PPO

## 2011-12-15 VITALS — BP 130/88 | HR 56 | Temp 98.0°F | Ht 68.0 in | Wt 187.0 lb

## 2011-12-15 DIAGNOSIS — E538 Deficiency of other specified B group vitamins: Secondary | ICD-10-CM

## 2011-12-15 DIAGNOSIS — I10 Essential (primary) hypertension: Secondary | ICD-10-CM

## 2011-12-15 DIAGNOSIS — M6740 Ganglion, unspecified site: Secondary | ICD-10-CM

## 2011-12-15 DIAGNOSIS — I2510 Atherosclerotic heart disease of native coronary artery without angina pectoris: Secondary | ICD-10-CM

## 2011-12-15 DIAGNOSIS — E291 Testicular hypofunction: Secondary | ICD-10-CM

## 2011-12-15 DIAGNOSIS — E119 Type 2 diabetes mellitus without complications: Secondary | ICD-10-CM

## 2011-12-15 DIAGNOSIS — H9190 Unspecified hearing loss, unspecified ear: Secondary | ICD-10-CM

## 2011-12-15 DIAGNOSIS — M545 Low back pain, unspecified: Secondary | ICD-10-CM

## 2011-12-15 DIAGNOSIS — H9319 Tinnitus, unspecified ear: Secondary | ICD-10-CM

## 2011-12-15 DIAGNOSIS — E785 Hyperlipidemia, unspecified: Secondary | ICD-10-CM

## 2011-12-15 LAB — HEMOGLOBIN A1C: Hgb A1c MFr Bld: 6.6 % — ABNORMAL HIGH (ref 4.6–6.5)

## 2011-12-15 MED ORDER — AMLODIPINE BESYLATE 2.5 MG PO TABS: 2.5000 mg | ORAL_TABLET | Freq: Every day | ORAL | Status: DC

## 2011-12-15 MED ORDER — TADALAFIL 20 MG PO TABS: 20.0000 mg | ORAL_TABLET | Freq: Every day | ORAL | Status: DC | PRN

## 2011-12-15 MED ORDER — TRAMADOL HCL 50 MG PO TABS: 50.0000 mg | ORAL_TABLET | Freq: Two times a day (BID) | ORAL | Status: AC | PRN

## 2011-12-15 NOTE — Assessment & Plan Note (Signed)
Continue with current prescription therapy as reflected on the Med list.  

## 2011-12-15 NOTE — Progress Notes (Signed)
Subjective:    Patient ID: Jared Alvarez, male    DOB: 1951/03/31, 61 y.o.   MRN: [id number]  HPI  The patient presents for a follow-up of  chronic hypertension, chronic dyslipidemia, type 2 diabetes controlled with medicines C/o pain and swelling in the L ulnar wrist worse w/playing music x months C/o hearing loss, tinnitus    Review of Systems  Constitutional: Negative for appetite change, fatigue and unexpected weight change.  HENT: Negative for nosebleeds, congestion, sore throat, sneezing, trouble swallowing and neck pain.   Eyes: Negative for itching and visual disturbance.  Respiratory: Negative for cough.   Cardiovascular: Negative for chest pain, palpitations and leg swelling.  Gastrointestinal: Negative for nausea, diarrhea, blood in stool and abdominal distention.  Genitourinary: Negative for frequency and hematuria.  Musculoskeletal: Positive for arthralgias. Negative for myalgias, back pain, joint swelling and gait problem.  Skin: Negative for rash.  Neurological: Negative for dizziness, tremors, speech difficulty and weakness.  Psychiatric/Behavioral: Negative for disturbed wake/sleep cycle, dysphoric mood and agitation. The patient is not nervous/anxious.    Wt Readings from Last 3 Encounters:  12/15/11 187 lb (84.823 kg)  11/18/11 186 lb (84.369 kg)  11/11/11 186 lb 3.2 oz (84.46 kg)   BP Readings from Last 3 Encounters:  12/15/11 130/88  11/18/11 142/77  10/22/11 118/68       Objective:   Physical Exam  Constitutional: He is oriented to person, place, and time. He appears well-developed.  HENT:  Mouth/Throat: Oropharynx is clear and moist.  Eyes: Conjunctivae are normal. Pupils are equal, round, and reactive to light.  Neck: Normal range of motion. No JVD present. No thyromegaly present.  Cardiovascular: Normal rate, regular rhythm, normal heart sounds and intact distal pulses.  Exam reveals no gallop and no friction rub.   No murmur  heard. Pulmonary/Chest: Effort normal and breath sounds normal. No respiratory distress. He has no wheezes. He has no rales. He exhibits no tenderness.  Abdominal: Soft. Bowel sounds are normal. He exhibits no distension and no mass. There is no tenderness. There is no rebound and no guarding.  Musculoskeletal: Normal range of motion. He exhibits no edema and no tenderness.  Lymphadenopathy:    He has no cervical adenopathy.  Neurological: He is alert and oriented to person, place, and time. He has normal reflexes. No cranial nerve deficit. He exhibits normal muscle tone. Coordination normal.  Skin: Skin is warm and dry. No rash noted.  Psychiatric: He has a normal mood and affect. His behavior is normal. Judgment and thought content normal.  L ulnar wrist aspect is tender  Lab Results  Component Value Date   WBC 9.3 05/21/2011   HGB 15.1 05/21/2011   HCT 44.0 05/21/2011   PLT 292.0 05/21/2011   GLUCOSE 121* 05/21/2011   CHOL 191 06/27/2010   TRIG 35.0 06/27/2010   HDL 65.10 06/27/2010   LDLCALC 119* 06/27/2010   ALT 20 06/27/2010   AST 18 06/27/2010   NA 138 05/21/2011   K 4.3 05/21/2011   CL 98 05/21/2011   CREATININE 0.8 05/21/2011   BUN 13 05/21/2011   CO2 28 05/21/2011   TSH 1.16 05/21/2011   PSA 0.29 12/02/2009   INR 0.98 06/03/2010   HGBA1C 6.7* 05/21/2011     Procedure Note :    Procedure :   Point of care (POC) sonography examination   Indication: L wrist swelling   Equipment used: Sonosite M-Turbo with HFL38x/13-6 MHz transducer linear probe. The images were stored  in the unit and later transferred in storage.  The patient was placed in a sitting position.  This study revealed a hypoechoic/heteroechoic lesion in the ulnar aspect of the wrist. No increased vasc activity on Doppler.   Impression: L wrist lesion c/w a ganglion cyst          Assessment & Plan:

## 2011-12-15 NOTE — Assessment & Plan Note (Signed)
10/2011 he stopped testosterone

## 2011-12-15 NOTE — Assessment & Plan Note (Signed)
Continue with current prescription therapy as reflected on the Med list. Labs  

## 2011-12-15 NOTE — Assessment & Plan Note (Signed)
ENT cons

## 2011-12-15 NOTE — Assessment & Plan Note (Signed)
Audiol cons 

## 2011-12-16 LAB — BASIC METABOLIC PANEL
Calcium: 9.6 mg/dL (ref 8.4–10.5)
GFR: 110.77 mL/min (ref >60.00)
Glucose, Bld: 94 mg/dL (ref 70–99)
Sodium: 139 mEq/L (ref 135–145)

## 2011-12-16 LAB — LIPID PANEL
Cholesterol: 233 mg/dL — ABNORMAL HIGH (ref 0–200)
Total CHOL/HDL Ratio: 3
Triglycerides: 122 mg/dL (ref 0.0–149.0)

## 2011-12-16 LAB — HEPATIC FUNCTION PANEL
ALT: 18 U/L (ref 0–53)
Total Bilirubin: 0.7 mg/dL (ref 0.3–1.2)
Total Protein: 7 g/dL (ref 6.0–8.3)

## 2011-12-17 ENCOUNTER — Telehealth: Payer: Self-pay | Admitting: Internal Medicine

## 2011-12-17 NOTE — Assessment & Plan Note (Signed)
8/13 L ulnar wrist [place] [facility] consult and/or injection offered

## 2011-12-17 NOTE — Telephone Encounter (Signed)
Jared Alvarez, please, inform patient that all labs are normal except for elev uric acid and low normal B12 Increase the use of B12 and improve diet  Please, mail the labs to the patient.     Thx

## 2011-12-18 NOTE — Telephone Encounter (Signed)
Left message for pt to callback office. (Lab results also mailed to pt's address on file)

## 2011-12-22 ENCOUNTER — Telehealth: Payer: Self-pay | Admitting: Internal Medicine

## 2011-12-22 NOTE — Telephone Encounter (Signed)
Jared Alvarez, please, inform patient that all labs are normal except for elev chol 233, elev gout test - uric acid, low nl B12 Rx as we discussed  Please, mail the labs to the patient.    Thx

## 2011-12-22 NOTE — Telephone Encounter (Signed)
Pt informed of lab results. 

## 2011-12-22 NOTE — Telephone Encounter (Signed)
Left message for pt to callback office.  

## 2011-12-22 NOTE — Telephone Encounter (Signed)
Pt informed by Morrie Sheldon per pt/ copies mailed prev by Morrie Sheldon, CMA.

## 2012-01-15 ENCOUNTER — Encounter: Payer: Self-pay | Admitting: Internal Medicine

## 2012-03-30 ENCOUNTER — Other Ambulatory Visit: Payer: Self-pay | Admitting: Internal Medicine

## 2012-04-06 ENCOUNTER — Other Ambulatory Visit: Payer: Self-pay | Admitting: *Deleted

## 2012-04-06 MED ORDER — TADALAFIL 20 MG PO TABS: 20.0000 mg | ORAL_TABLET | Freq: Every day | ORAL | Status: DC | PRN

## 2012-04-07 ENCOUNTER — Telehealth: Payer: Self-pay

## 2012-04-07 ENCOUNTER — Other Ambulatory Visit: Payer: Self-pay | Admitting: *Deleted

## 2012-04-07 MED ORDER — TADALAFIL 20 MG PO TABS: 20.0000 mg | ORAL_TABLET | Freq: Every day | ORAL | Status: DC | PRN

## 2012-04-07 MED ORDER — VILAZODONE HCL 40 MG PO TABS: 40.0000 mg | ORAL_TABLET | Freq: Every day | ORAL | Status: DC

## 2012-04-07 NOTE — Telephone Encounter (Signed)
Pt informed

## 2012-04-07 NOTE — Telephone Encounter (Signed)
Pt called stating that Rx for Viibryd should be for 1 tablet po qd not 0.5 tablet, please advise.

## 2012-04-07 NOTE — Telephone Encounter (Signed)
1 po qd is correct Tx

## 2012-04-11 ENCOUNTER — Ambulatory Visit (INDEPENDENT_AMBULATORY_CARE_PROVIDER_SITE_OTHER): Payer: BC Managed Care – PPO | Admitting: Internal Medicine

## 2012-04-11 ENCOUNTER — Encounter: Payer: Self-pay | Admitting: Internal Medicine

## 2012-04-11 VITALS — BP 160/90 | HR 68 | Temp 97.4°F | Resp 16 | Wt 189.0 lb

## 2012-04-11 DIAGNOSIS — F3289 Other specified depressive episodes: Secondary | ICD-10-CM

## 2012-04-11 DIAGNOSIS — I10 Essential (primary) hypertension: Secondary | ICD-10-CM

## 2012-04-11 DIAGNOSIS — K5900 Constipation, unspecified: Secondary | ICD-10-CM

## 2012-04-11 DIAGNOSIS — M109 Gout, unspecified: Secondary | ICD-10-CM

## 2012-04-11 DIAGNOSIS — Z23 Encounter for immunization: Secondary | ICD-10-CM

## 2012-04-11 DIAGNOSIS — E291 Testicular hypofunction: Secondary | ICD-10-CM

## 2012-04-11 DIAGNOSIS — F329 Major depressive disorder, single episode, unspecified: Secondary | ICD-10-CM

## 2012-04-11 DIAGNOSIS — E538 Deficiency of other specified B group vitamins: Secondary | ICD-10-CM

## 2012-04-11 DIAGNOSIS — E119 Type 2 diabetes mellitus without complications: Secondary | ICD-10-CM

## 2012-04-11 MED ORDER — VALSARTAN 320 MG PO TABS: 320.0000 mg | ORAL_TABLET | Freq: Every day | ORAL | Status: DC

## 2012-04-11 MED ORDER — LINACLOTIDE 290 MCG PO CAPS
1.0000 | ORAL_CAPSULE | Freq: Every day | ORAL | Status: DC
Start: 2012-04-11 — End: 2013-11-16

## 2012-04-11 MED ORDER — ROSUVASTATIN CALCIUM 40 MG PO TABS: 40.0000 mg | ORAL_TABLET | Freq: Every day | ORAL | Status: DC

## 2012-04-11 NOTE — Progress Notes (Signed)
Patient ID: Jared Alvarez, male   DOB: 1950-07-28, 61 y.o.   MRN: [id number]   Subjective:    Patient ID: Jared Alvarez, male    DOB: 1951/04/16, 61 y.o.   MRN: [id number]  HPI  The patient presents for a follow-up of  chronic hypertension, chronic dyslipidemia, type 2 diabetes controlled with medicines C/o pain and swelling in the L ulnar wrist worse w/playing music x months C/o hearing loss, tinnitus    Review of Systems  Constitutional: Negative for appetite change, fatigue and unexpected weight change.  HENT: Negative for nosebleeds, congestion, sore throat, sneezing, trouble swallowing and neck pain.   Eyes: Negative for itching and visual disturbance.  Respiratory: Negative for cough.   Cardiovascular: Negative for chest pain, palpitations and leg swelling.  Gastrointestinal: Negative for nausea, diarrhea, blood in stool and abdominal distention.  Genitourinary: Negative for frequency and hematuria.  Musculoskeletal: Positive for arthralgias. Negative for myalgias, back pain, joint swelling and gait problem.  Skin: Negative for rash.  Neurological: Negative for dizziness, tremors, speech difficulty and weakness.  Psychiatric/Behavioral: Negative for sleep disturbance, dysphoric mood and agitation. The patient is not nervous/anxious.    Wt Readings from Last 3 Encounters:  04/11/12 189 lb (85.73 kg)  12/15/11 187 lb (84.823 kg)  11/18/11 186 lb (84.369 kg)   BP Readings from Last 3 Encounters:  04/11/12 160/90  12/15/11 130/88  11/18/11 142/77       Objective:   Physical Exam  Constitutional: He is oriented to person, place, and time. He appears well-developed.  HENT:  Mouth/Throat: Oropharynx is clear and moist.  Eyes: Conjunctivae normal are normal. Pupils are equal, round, and reactive to light.  Neck: Normal range of motion. No JVD present. No thyromegaly present.  Cardiovascular: Normal rate, regular rhythm, normal heart sounds and intact distal pulses.   Exam reveals no gallop and no friction rub.   No murmur heard. Pulmonary/Chest: Effort normal and breath sounds normal. No respiratory distress. He has no wheezes. He has no rales. He exhibits no tenderness.  Abdominal: Soft. Bowel sounds are normal. He exhibits no distension and no mass. There is no tenderness. There is no rebound and no guarding.  Musculoskeletal: Normal range of motion. He exhibits no edema and no tenderness.  Lymphadenopathy:    He has no cervical adenopathy.  Neurological: He is alert and oriented to person, place, and time. He has normal reflexes. No cranial nerve deficit. He exhibits normal muscle tone. Coordination normal.  Skin: Skin is warm and dry. No rash noted.  Psychiatric: He has a normal mood and affect. His behavior is normal. Judgment and thought content normal.  L ulnar wrist aspect is tender  Lab Results  Component Value Date   WBC 9.3 05/21/2011   HGB 15.1 05/21/2011   HCT 44.0 05/21/2011   PLT 292.0 05/21/2011   GLUCOSE 94 12/15/2011   CHOL 233* 12/15/2011   TRIG 122.0 12/15/2011   HDL 68.70 12/15/2011   LDLDIRECT 149.6 12/15/2011   LDLCALC 119* 06/27/2010   ALT 18 12/15/2011   AST 18 12/15/2011   NA 139 12/15/2011   K 4.4 12/15/2011   CL 103 12/15/2011   CREATININE 0.8 12/15/2011   BUN 12 12/15/2011   CO2 28 12/15/2011   TSH 0.97 12/15/2011   PSA 0.29 12/02/2009   INR 0.98 06/03/2010   HGBA1C 6.6* 12/15/2011     Procedure Note :    Procedure :   Point of care (POC) sonography examination  Indication: L wrist swelling   Equipment used: Sonosite M-Turbo with HFL38x/13-6 MHz transducer linear probe. The images were stored in the unit and later transferred in storage.  The patient was placed in a sitting position.  This study revealed a hypoechoic/heteroechoic lesion in the ulnar aspect of the wrist. No increased vasc activity on Doppler.   Impression: L wrist lesion c/w a ganglion cyst          Assessment & Plan:

## 2012-04-11 NOTE — Assessment & Plan Note (Signed)
Chronic and multifactorial 12.13

## 2012-04-13 ENCOUNTER — Encounter: Payer: Self-pay | Admitting: Internal Medicine

## 2012-04-13 NOTE — Assessment & Plan Note (Signed)
Continue with current prescription therapy as reflected on the Med list.  

## 2012-04-13 NOTE — Assessment & Plan Note (Signed)
10/2011 he stopped testosterone due to pimples and worries about other side effects

## 2012-04-13 NOTE — Assessment & Plan Note (Signed)
Discussed - see med change

## 2012-05-07 ENCOUNTER — Other Ambulatory Visit: Payer: Self-pay | Admitting: Internal Medicine

## 2012-05-16 IMAGING — CR DG CHEST 2V
2 series · 2 of 2 positions shown · non-contrast
Comparison: None

CLINICAL DATA: Right inguinal hernia.  Preop radiograph.

CHEST - 2 VIEW

[w chest pa]
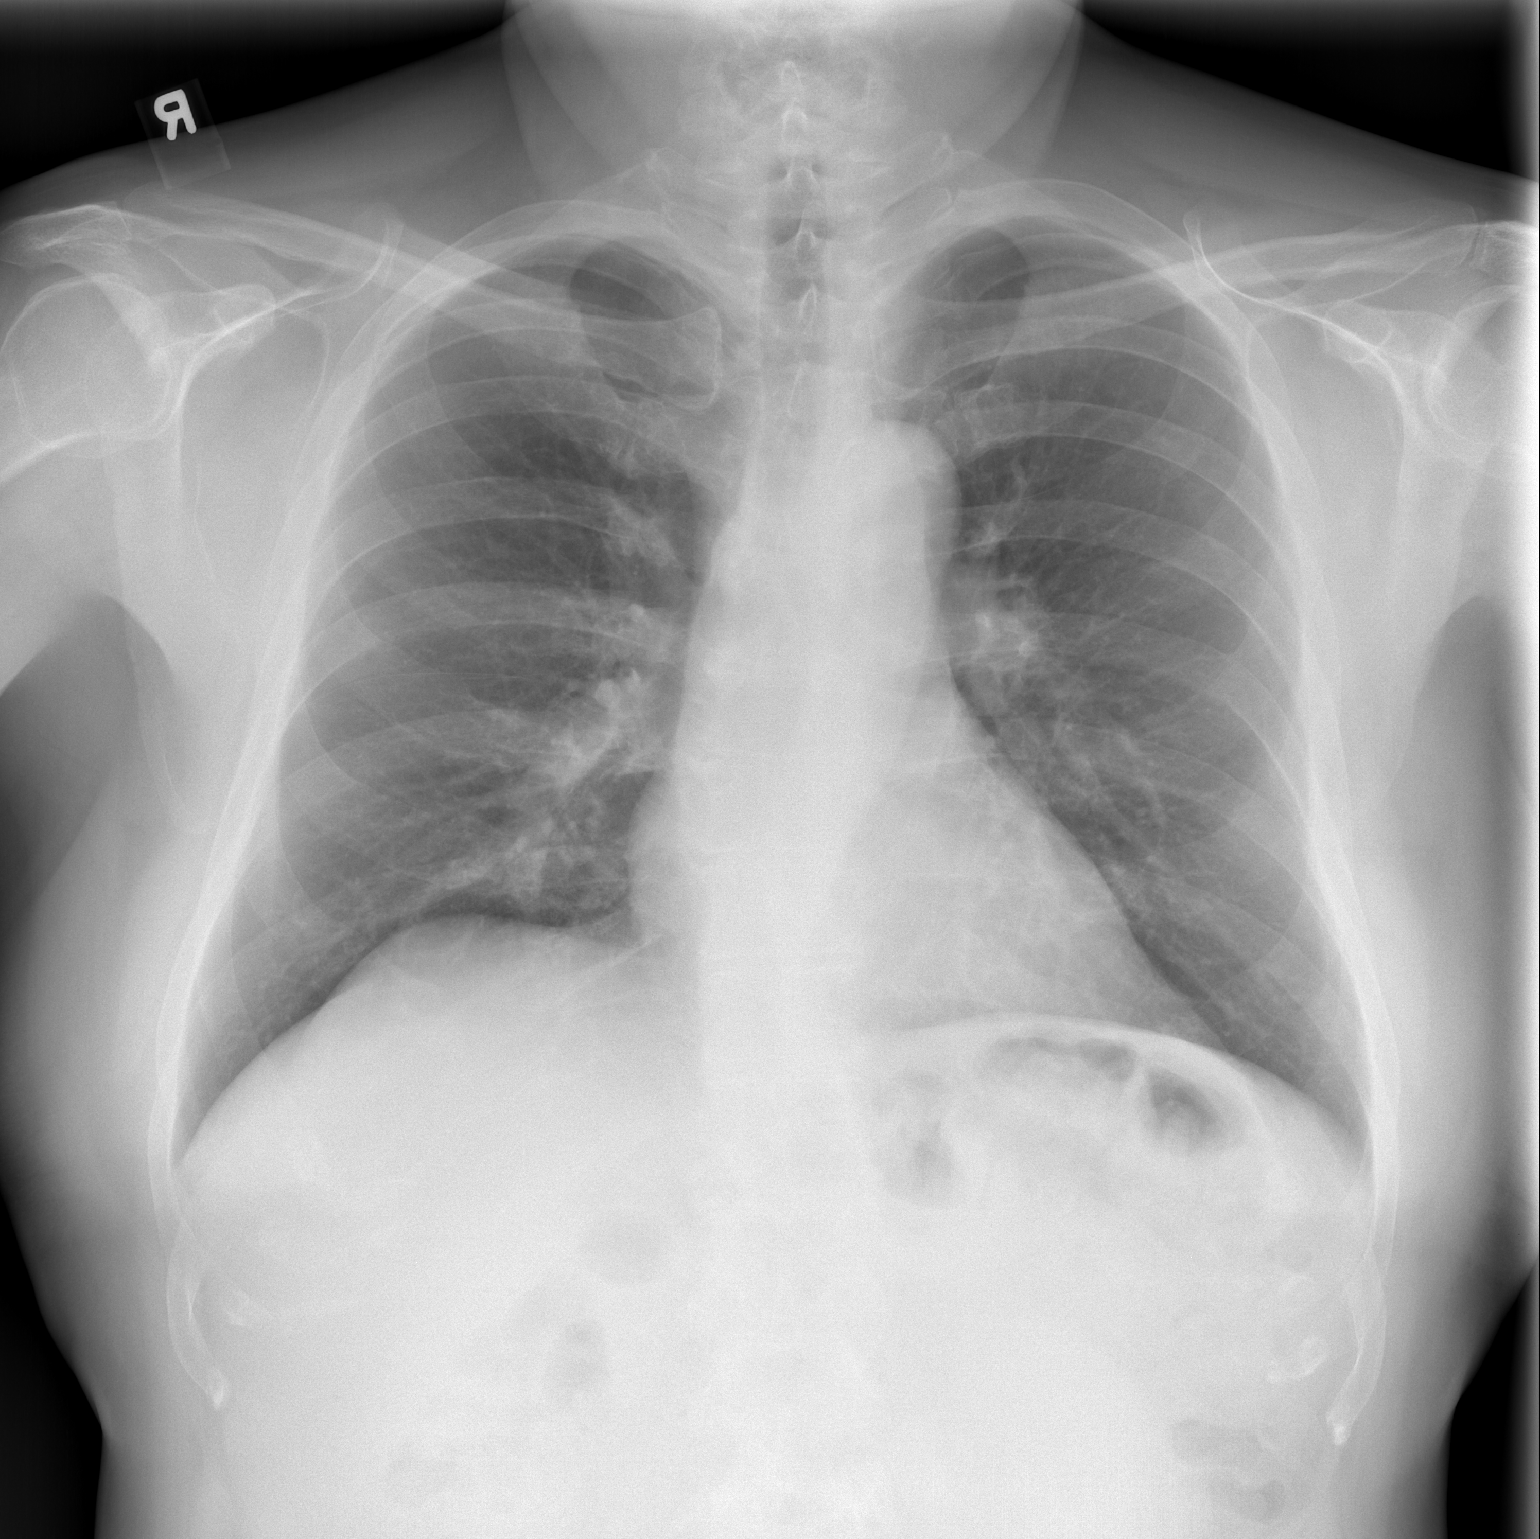

[w chest lat]
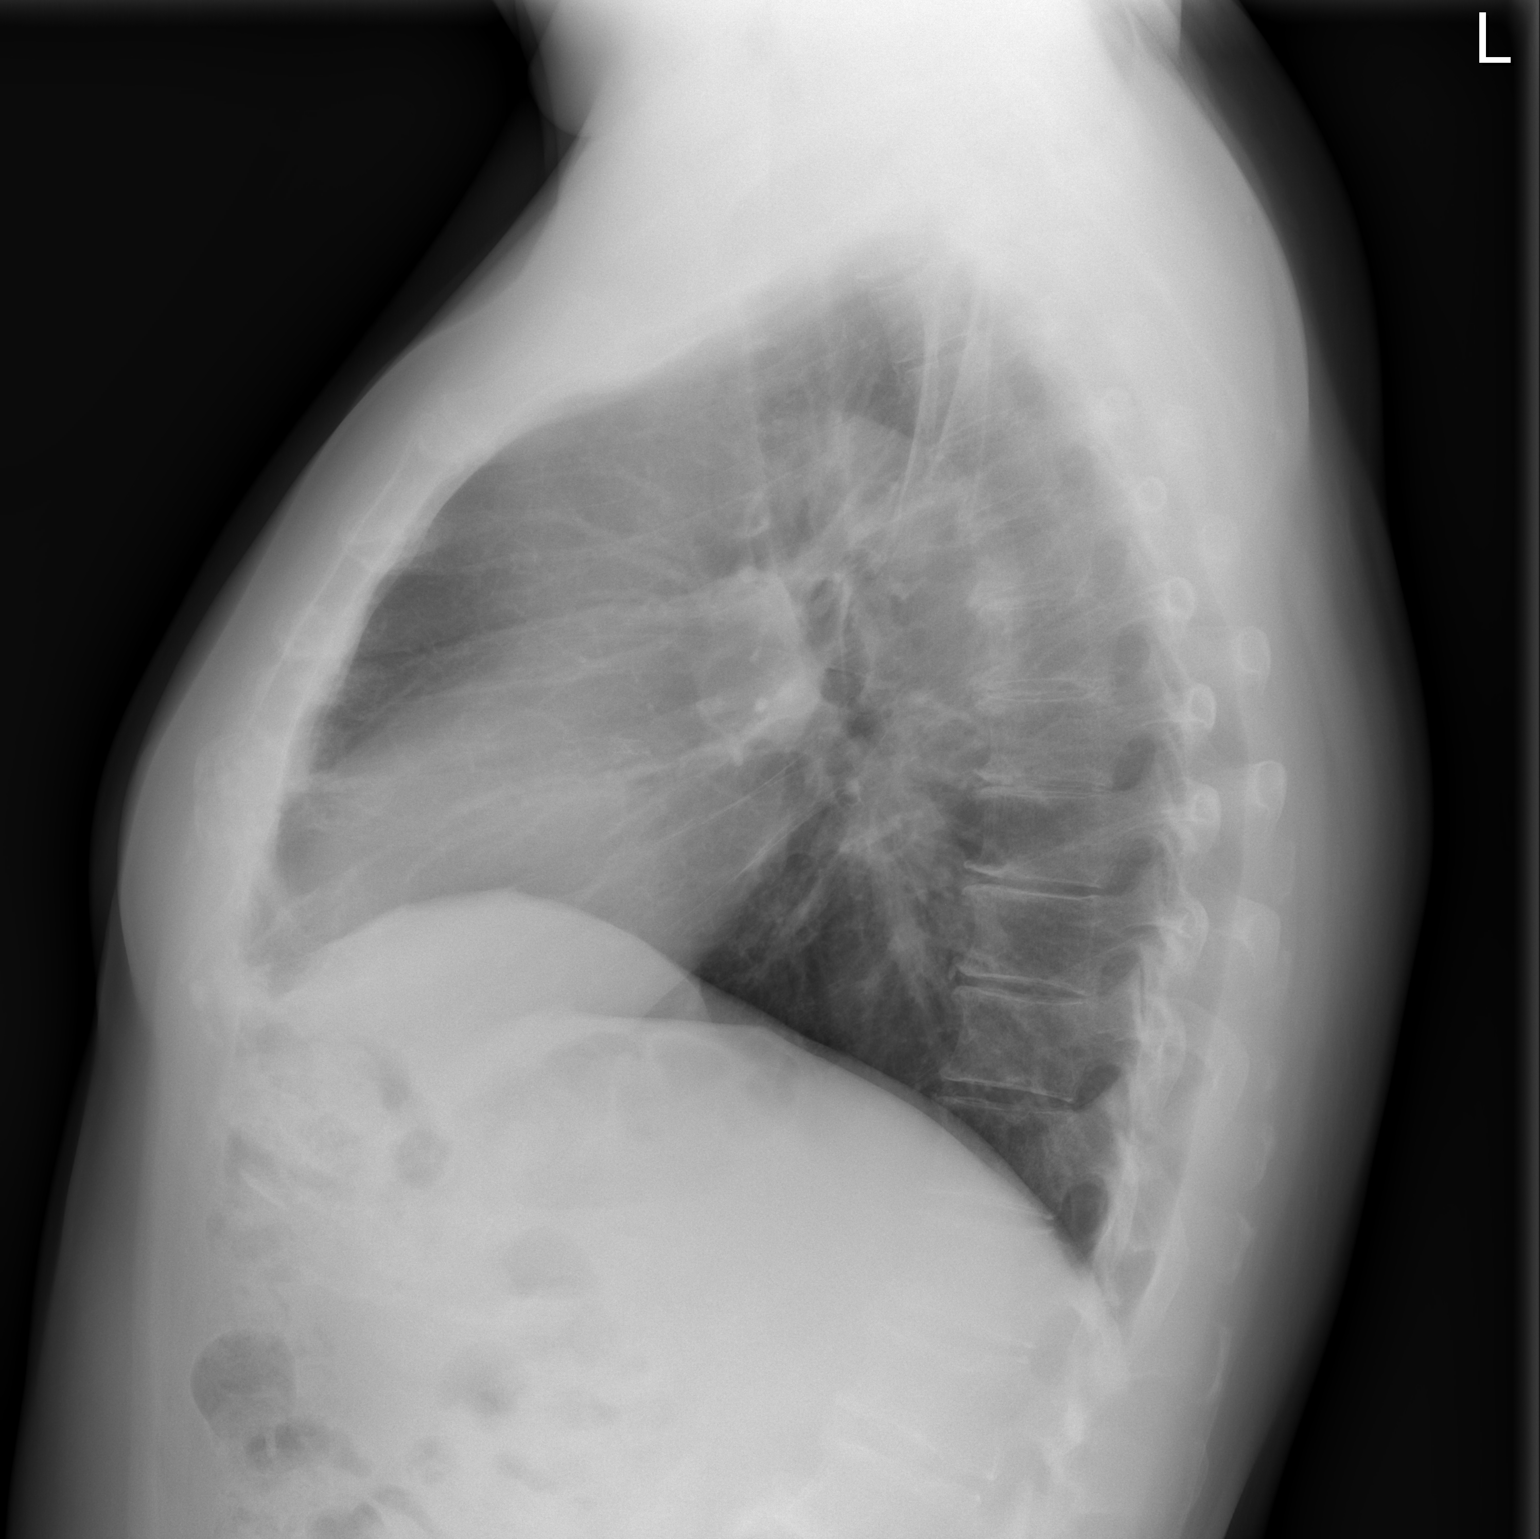

[2 of 2 positions shown; findings below may reference images not displayed]

FINDINGS: The heart size and mediastinal contours are within normal
limits.  Both lungs are clear.  The visualized skeletal structures
are significant for mild spondylosis.
IMPRESSION: No active cardiopulmonary abnormalities.

## 2012-05-19 ENCOUNTER — Ambulatory Visit (INDEPENDENT_AMBULATORY_CARE_PROVIDER_SITE_OTHER): Payer: BC Managed Care – PPO | Admitting: Internal Medicine

## 2012-05-19 ENCOUNTER — Encounter: Payer: Self-pay | Admitting: Internal Medicine

## 2012-05-19 VITALS — BP 150/96 | HR 80 | Temp 96.7°F | Resp 16 | Wt 194.0 lb

## 2012-05-19 DIAGNOSIS — E291 Testicular hypofunction: Secondary | ICD-10-CM

## 2012-05-19 DIAGNOSIS — IMO0002 Reserved for concepts with insufficient information to code with codable children: Secondary | ICD-10-CM

## 2012-05-19 DIAGNOSIS — I10 Essential (primary) hypertension: Secondary | ICD-10-CM

## 2012-05-19 DIAGNOSIS — M6740 Ganglion, unspecified site: Secondary | ICD-10-CM

## 2012-05-19 DIAGNOSIS — N369 Urethral disorder, unspecified: Secondary | ICD-10-CM | POA: Insufficient documentation

## 2012-05-19 NOTE — Assessment & Plan Note (Signed)
Ortho cons if needed

## 2012-05-19 NOTE — Assessment & Plan Note (Signed)
1/14 ?angioma vs nevus 1mm at 3 o'clock

## 2012-05-19 NOTE — Assessment & Plan Note (Addendum)
Chronic   Potential benefits of a long term testosterone  use as well as potential risks  and complications were explained to the patient and were aknowledged. 10/2011 he stopped testosterone due to pimples  Urol ref

## 2012-05-19 NOTE — Assessment & Plan Note (Signed)
Continue with current prescription therapy as reflected on the Med list. Loose wt NA diet

## 2012-05-19 NOTE — Progress Notes (Signed)
Subjective:    Patient ID: Jared Alvarez, male    DOB: 01-26-1951, 62 y.o.   MRN: [id number]  HPI C/o a lesion in th penis opening F/u hypothyroidism The patient presents for a follow-up of  chronic hypertension, chronic dyslipidemia, type 2 diabetes controlled with medicines F/u pain and swelling in the L ulnar wrist worse w/playing music x months F/u hearing loss, tinnitus - he got a hearing test - decided not to have a hearing aid    Review of Systems  Constitutional: Negative for appetite change, fatigue and unexpected weight change.  HENT: Negative for nosebleeds, congestion, sore throat, sneezing, trouble swallowing and neck pain.   Eyes: Negative for itching and visual disturbance.  Respiratory: Negative for cough.   Cardiovascular: Negative for chest pain, palpitations and leg swelling.  Gastrointestinal: Negative for nausea, diarrhea, blood in stool and abdominal distention.  Genitourinary: Negative for frequency and hematuria.  Musculoskeletal: Positive for arthralgias. Negative for myalgias, back pain, joint swelling and gait problem.  Skin: Negative for rash.  Neurological: Negative for dizziness, tremors, speech difficulty and weakness.  Psychiatric/Behavioral: Negative for sleep disturbance, dysphoric mood and agitation. The patient is not nervous/anxious.    Wt Readings from Last 3 Encounters:  05/19/12 194 lb (87.998 kg)  04/11/12 189 lb (85.73 kg)  12/15/11 187 lb (84.823 kg)   BP Readings from Last 3 Encounters:  05/19/12 150/96  04/11/12 160/90  12/15/11 130/88       BP 150/96  Pulse 80  Temp 96.7 F (35.9 C) (Oral)  Resp 16  Wt 194 lb (87.998 kg)     Objective:   Physical Exam  Constitutional: He is oriented to person, place, and time. He appears well-developed.  HENT:  Mouth/Throat: Oropharynx is clear and moist.  Eyes: Conjunctivae normal are normal. Pupils are equal, round, and reactive to light.  Neck: Normal range of motion. No JVD  present. No thyromegaly present.  Cardiovascular: Normal rate, regular rhythm, normal heart sounds and intact distal pulses.  Exam reveals no gallop and no friction rub.   No murmur heard. Pulmonary/Chest: Effort normal and breath sounds normal. No respiratory distress. He has no wheezes. He has no rales. He exhibits no tenderness.  Abdominal: Soft. Bowel sounds are normal. He exhibits no distension and no mass. There is no tenderness. There is no rebound and no guarding.  Musculoskeletal: Normal range of motion. He exhibits no edema and no tenderness.  Lymphadenopathy:    He has no cervical adenopathy.  Neurological: He is alert and oriented to person, place, and time. He has normal reflexes. No cranial nerve deficit. He exhibits normal muscle tone. Coordination normal.  Skin: Skin is warm and dry. No rash noted.  Psychiatric: He has a normal mood and affect. His behavior is normal. Judgment and thought content normal.  L ulnar wrist aspect is tender  Lab Results  Component Value Date   WBC 9.3 05/21/2011   HGB 15.1 05/21/2011   HCT 44.0 05/21/2011   PLT 292.0 05/21/2011   GLUCOSE 94 12/15/2011   CHOL 233* 12/15/2011   TRIG 122.0 12/15/2011   HDL 68.70 12/15/2011   LDLDIRECT 149.6 12/15/2011   LDLCALC 119* 06/27/2010   ALT 18 12/15/2011   AST 18 12/15/2011   NA 139 12/15/2011   K 4.4 12/15/2011   CL 103 12/15/2011   CREATININE 0.8 12/15/2011   BUN 12 12/15/2011   CO2 28 12/15/2011   TSH 0.97 12/15/2011   PSA 0.29 12/02/2009  INR 0.98 06/03/2010   HGBA1C 6.6* 12/15/2011             Assessment & Plan:

## 2012-06-12 ENCOUNTER — Other Ambulatory Visit: Payer: Self-pay | Admitting: Internal Medicine

## 2012-08-25 ENCOUNTER — Other Ambulatory Visit: Payer: Self-pay | Admitting: *Deleted

## 2012-08-25 MED ORDER — COLCHICINE 0.6 MG PO TABS: 0.6000 mg | ORAL_TABLET | ORAL | Status: DC | PRN

## 2012-08-28 ENCOUNTER — Other Ambulatory Visit: Payer: Self-pay | Admitting: Internal Medicine

## 2012-09-27 ENCOUNTER — Telehealth: Payer: Self-pay | Admitting: Internal Medicine

## 2012-09-27 NOTE — Telephone Encounter (Signed)
Pt discussed a cyst on his left wrist.  It has gotten worse and may be pressing on a nerve.  Has problems lifting.  Painful.  He wants a referral.

## 2012-09-29 NOTE — Telephone Encounter (Signed)
Pt called back today, got an appt with Dr. Macario Golds at 4:15 for 09/30/12. Pt stated the wrist is getting worse, in pain, cant wait.

## 2012-09-30 ENCOUNTER — Ambulatory Visit (INDEPENDENT_AMBULATORY_CARE_PROVIDER_SITE_OTHER): Payer: BC Managed Care – PPO | Admitting: Internal Medicine

## 2012-09-30 ENCOUNTER — Encounter: Payer: Self-pay | Admitting: Internal Medicine

## 2012-09-30 VITALS — BP 138/80 | HR 76 | Temp 97.8°F | Resp 16 | Wt 189.0 lb

## 2012-09-30 DIAGNOSIS — M67432 Ganglion, left wrist: Secondary | ICD-10-CM

## 2012-09-30 DIAGNOSIS — M6740 Ganglion, unspecified site: Secondary | ICD-10-CM

## 2012-09-30 MED ORDER — TRAMADOL HCL 50 MG PO TABS: 50.0000 mg | ORAL_TABLET | Freq: Two times a day (BID) | ORAL | Status: DC | PRN

## 2012-09-30 NOTE — Assessment & Plan Note (Signed)
5/14 ulnar aspect -- worse Tramadol prn Dr Amanda Pea

## 2012-09-30 NOTE — Progress Notes (Signed)
Subjective:    Patient ID: Jared Alvarez, male    DOB: 18-Jun-1950, 62 y.o.   MRN: [id number]  HPI F/u a lesion in th penis opening - s/p Urol ref F/u hypothyroidism The patient presents for a follow-up of  chronic hypertension, chronic dyslipidemia, type 2 diabetes controlled with medicines F/u pain and swelling in the L ulnar wrist worse w/playing music x months - worse F/u hearing loss, tinnitus - he got a hearing test - decided not to have a hearing aid    Review of Systems  Constitutional: Negative for appetite change, fatigue and unexpected weight change.  HENT: Negative for nosebleeds, congestion, sore throat, sneezing, trouble swallowing and neck pain.   Eyes: Negative for itching and visual disturbance.  Respiratory: Negative for cough.   Cardiovascular: Negative for chest pain, palpitations and leg swelling.  Gastrointestinal: Negative for nausea, diarrhea, blood in stool and abdominal distention.  Genitourinary: Negative for frequency and hematuria.  Musculoskeletal: Positive for arthralgias. Negative for myalgias, back pain, joint swelling and gait problem.  Skin: Negative for rash.  Neurological: Negative for dizziness, tremors, speech difficulty and weakness.  Psychiatric/Behavioral: Negative for sleep disturbance, dysphoric mood and agitation. The patient is not nervous/anxious.    Wt Readings from Last 3 Encounters:  09/30/12 189 lb (85.73 kg)  05/19/12 194 lb (87.998 kg)  04/11/12 189 lb (85.73 kg)   BP Readings from Last 3 Encounters:  09/30/12 138/80  05/19/12 150/96  04/11/12 160/90       BP 138/80  Pulse 76  Temp(Src) 97.8 F (36.6 C) (Oral)  Resp 16  Wt 189 lb (85.73 kg)  BMI 28.74 kg/m2     Objective:   Physical Exam  Constitutional: He is oriented to person, place, and time. He appears well-developed.  HENT:  Mouth/Throat: Oropharynx is clear and moist.  Eyes: Conjunctivae normal are normal. Pupils are equal, round, and reactive to  light.  Neck: Normal range of motion. No JVD present. No thyromegaly present.  Cardiovascular: Normal rate, regular rhythm, normal heart sounds and intact distal pulses.  Exam reveals no gallop and no friction rub.   No murmur heard. Pulmonary/Chest: Effort normal and breath sounds normal. No respiratory distress. He has no wheezes. He has no rales. He exhibits no tenderness.  Abdominal: Soft. Bowel sounds are normal. He exhibits no distension and no mass. There is no tenderness. There is no rebound and no guarding.  Musculoskeletal: Normal range of motion. He exhibits no edema and no tenderness.  Lymphadenopathy:    He has no cervical adenopathy.  Neurological: He is alert and oriented to person, place, and time. He has normal reflexes. No cranial nerve deficit. He exhibits normal muscle tone. Coordination normal.  Skin: Skin is warm and dry. No rash noted.  Psychiatric: He has a normal mood and affect. His behavior is normal. Judgment and thought content normal.  L ulnar wrist aspect is tender  Lab Results  Component Value Date   WBC 9.3 05/21/2011   HGB 15.1 05/21/2011   HCT 44.0 05/21/2011   PLT 292.0 05/21/2011   GLUCOSE 94 12/15/2011   CHOL 233* 12/15/2011   TRIG 122.0 12/15/2011   HDL 68.70 12/15/2011   LDLDIRECT 149.6 12/15/2011   LDLCALC 119* 06/27/2010   ALT 18 12/15/2011   AST 18 12/15/2011   NA 139 12/15/2011   K 4.4 12/15/2011   CL 103 12/15/2011   CREATININE 0.8 12/15/2011   BUN 12 12/15/2011   CO2 28 12/15/2011  TSH 0.97 12/15/2011   PSA 0.29 12/02/2009   INR 0.98 06/03/2010   HGBA1C 6.6* 12/15/2011             Assessment & Plan:

## 2012-10-01 ENCOUNTER — Encounter: Payer: Self-pay | Admitting: Internal Medicine

## 2012-10-25 ENCOUNTER — Ambulatory Visit (INDEPENDENT_AMBULATORY_CARE_PROVIDER_SITE_OTHER): Payer: BC Managed Care – PPO | Admitting: Cardiovascular Disease

## 2012-10-25 ENCOUNTER — Encounter: Payer: Self-pay | Admitting: Cardiovascular Disease

## 2012-10-25 VITALS — BP 126/85 | HR 50 | Wt 189.0 lb

## 2012-10-25 DIAGNOSIS — I2510 Atherosclerotic heart disease of native coronary artery without angina pectoris: Secondary | ICD-10-CM

## 2012-10-25 DIAGNOSIS — E785 Hyperlipidemia, unspecified: Secondary | ICD-10-CM

## 2012-10-25 DIAGNOSIS — R002 Palpitations: Secondary | ICD-10-CM

## 2012-10-25 DIAGNOSIS — I10 Essential (primary) hypertension: Secondary | ICD-10-CM

## 2012-10-25 NOTE — Assessment & Plan Note (Signed)
Cholesterol is at goal.  Continue current dose of statin and diet Rx.  No myalgias or side effects.  F/U  LFT's in 6 months. Lab Results  Component Value Date   LDLCALC 119* 06/27/2010

## 2012-10-25 NOTE — Assessment & Plan Note (Signed)
Stable with no angina and good activity level.  Continue medical Rx  

## 2012-10-25 NOTE — Assessment & Plan Note (Signed)
Well controlled.  Continue current medications and low sodium Dash type diet.    

## 2012-10-25 NOTE — Patient Instructions (Addendum)
Your physician wants you to follow-up in: YEAR WITH DR NISHAN  You will receive a reminder letter in the mail two months in advance. If you don't receive a letter, please call our office to schedule the follow-up appointment.  Your physician recommends that you continue on your current medications as directed. Please refer to the Current Medication list given to you today. 

## 2012-10-25 NOTE — Assessment & Plan Note (Signed)
Benign infrequent Normal ECG no beta blocker due to relative bradycardia

## 2012-10-25 NOTE — Progress Notes (Signed)
Patient ID: Jared Alvarez, male   DOB: 1950-08-07, 62 y.o.   MRN: [id number] Jared Alvarez is a 62 y.o. male presents for evaluation of chest pain and palpitations. He has a h/o CAD, s/p stent to the LAD in 1997 in [place]. Also has DM2, HTN, HLP Gout, depression, anxiety, hypogonadism. He established with me in 3/12. Saw PA October 2012  with chest pain and had benign myovue and echo Some stress running [employer] but otherwise doing well Taking crestor 3-4 x/week and wonders if it is affecting his memory.  Last myovue : 02/12/11 reviewed Normal no ishcemia EF 56%  Echo: 02/12/11 reviewed  Study Conclusions  - Left ventricle: The cavity size was normal. Wall thickness was increased in a pattern of mild LVH. Systolic function was normal. The estimated ejection fraction was in the range of 55% to 60%. Wall motion was normal; there were no regional wall motion abnormalities. - Aortic valve: Trivial regurgitation. - Left atrium: The atrium was mildly dilated. - Pulmonary arteries: Systolic pressure was mildly increased. PA peak pressure: 33mm Hg (S).  Some hip arthritis No angina Rare palpitations  ROS: Denies fever, malais, weight loss, blurry vision, decreased visual acuity, cough, sputum, SOB, hemoptysis, pleuritic pain, palpitaitons, heartburn, abdominal pain, melena, lower extremity edema, claudication, or rash.  All other systems reviewed and negative  General: Affect appropriate Healthy:  appears stated age HEENT: normal Neck supple with no adenopathy JVP normal no bruits no thyromegaly Lungs clear with no wheezing and good diaphragmatic motion Heart:  S1/S2 no murmur, no rub, gallop or click PMI normal Abdomen: benighn, BS positve, no tenderness, no AAA no bruit.  No HSM or HJR Distal pulses intact with no bruits No edema Neuro non-focal Skin warm and dry No muscular weakness   Current Outpatient Prescriptions  Medication Sig Dispense Refill  . amLODipine (NORVASC)  2.5 MG tablet Take 1 tablet (2.5 mg total) by mouth daily.  90 tablet  3  . Ascorbic Acid (VITAMIN C) 1000 MG tablet Take 1,000 mg by mouth daily.        [name] aspirin 81 MG tablet Take 81 mg by mouth daily.        . colchicine 0.6 MG tablet Take 1 tablet (0.6 mg total) by mouth as needed. If patient has flare up with gout  30 tablet  5  . FLUoxetine (PROZAC) 20 MG capsule TAKE ONE CAPSULE BY MOUTH EVERY DAY  90 capsule  3  . Linaclotide 290 MCG CAPS Take 1 capsule by mouth daily.  90 capsule  3  . LORazepam (ATIVAN) 1 MG tablet Take 1 tablet (1 mg total) by mouth 2 (two) times daily as needed for anxiety.  180 tablet  2  . metFORMIN (GLUCOPHAGE) 1000 MG tablet TAKE ONE TABLET BY MOUTH TWICE DAILY WITH FOOD  180 tablet  2  . rosuvastatin (CRESTOR) 40 MG tablet Take 1 tablet (40 mg total) by mouth daily.  90 tablet  3  . traMADol (ULTRAM) 50 MG tablet Take 1-2 tablets (50-100 mg total) by mouth 2 (two) times daily as needed for pain.  60 tablet  1  . valsartan (DIOVAN) 320 MG tablet Take 1 tablet (320 mg total) by mouth daily.  90 tablet  3   No current facility-administered medications for this visit.    Allergies  Atorvastatin; Bupropion hcl; Duloxetine; Fluoxetine hcl; Simvastatin; Venlafaxine; and Viibryd  Electrocardiogram: 01/29/11 SR rate 62 nonspecific ST/T wave changes  Today SB rate 50 normal  otherwise  Assessment and Plan

## 2012-10-26 ENCOUNTER — Encounter: Payer: Self-pay | Admitting: Cardiovascular Disease

## 2012-11-20 ENCOUNTER — Other Ambulatory Visit: Payer: Self-pay | Admitting: Internal Medicine

## 2013-01-04 ENCOUNTER — Encounter: Payer: Self-pay | Admitting: Internal Medicine

## 2013-01-30 ENCOUNTER — Telehealth: Payer: Self-pay | Admitting: *Deleted

## 2013-01-30 DIAGNOSIS — M545 Low back pain: Secondary | ICD-10-CM

## 2013-01-30 NOTE — Telephone Encounter (Signed)
Pt called requesting a referral to PT for recurrent low back pain.  Please advise

## 2013-01-31 NOTE — Telephone Encounter (Signed)
Left message- referral placed.  

## 2013-01-31 NOTE — Telephone Encounter (Signed)
Ok Thx 

## 2013-02-06 ENCOUNTER — Ambulatory Visit: Payer: BC Managed Care – PPO | Attending: Internal Medicine | Admitting: Physical Therapy

## 2013-02-06 DIAGNOSIS — M545 Low back pain, unspecified: Secondary | ICD-10-CM | POA: Insufficient documentation

## 2013-02-06 DIAGNOSIS — IMO0001 Reserved for inherently not codable concepts without codable children: Secondary | ICD-10-CM | POA: Insufficient documentation

## 2013-02-06 DIAGNOSIS — M25559 Pain in unspecified hip: Secondary | ICD-10-CM | POA: Insufficient documentation

## 2013-02-06 DIAGNOSIS — R293 Abnormal posture: Secondary | ICD-10-CM | POA: Insufficient documentation

## 2013-02-13 ENCOUNTER — Ambulatory Visit: Payer: BC Managed Care – PPO | Admitting: Physical Therapy

## 2013-02-16 ENCOUNTER — Ambulatory Visit: Payer: BC Managed Care – PPO | Admitting: Physical Therapy

## 2013-02-20 ENCOUNTER — Ambulatory Visit: Payer: BC Managed Care – PPO | Admitting: Physical Therapy

## 2013-02-20 ENCOUNTER — Other Ambulatory Visit: Payer: Self-pay | Admitting: Internal Medicine

## 2013-02-20 NOTE — Telephone Encounter (Signed)
Ok to refill? Last OV 5.30.14 Last filled 5.30.14

## 2013-02-23 ENCOUNTER — Encounter: Payer: BC Managed Care – PPO | Admitting: Physical Therapy

## 2013-02-27 ENCOUNTER — Other Ambulatory Visit: Payer: Self-pay | Admitting: Internal Medicine

## 2013-02-28 ENCOUNTER — Ambulatory Visit: Payer: BC Managed Care – PPO | Admitting: Physical Therapy

## 2013-03-09 ENCOUNTER — Other Ambulatory Visit: Payer: Self-pay

## 2013-03-16 ENCOUNTER — Other Ambulatory Visit: Payer: Self-pay | Admitting: *Deleted

## 2013-03-16 MED ORDER — TADALAFIL 20 MG PO TABS: 20.0000 mg | ORAL_TABLET | Freq: Every day | ORAL | Status: DC | PRN

## 2013-04-03 ENCOUNTER — Other Ambulatory Visit: Payer: Self-pay | Admitting: Internal Medicine

## 2013-04-13 ENCOUNTER — Other Ambulatory Visit (INDEPENDENT_AMBULATORY_CARE_PROVIDER_SITE_OTHER): Payer: BC Managed Care – PPO

## 2013-04-13 ENCOUNTER — Encounter: Payer: Self-pay | Admitting: Internal Medicine

## 2013-04-13 ENCOUNTER — Ambulatory Visit (INDEPENDENT_AMBULATORY_CARE_PROVIDER_SITE_OTHER): Payer: BC Managed Care – PPO | Admitting: Internal Medicine

## 2013-04-13 VITALS — BP 140/82 | HR 72 | Temp 97.1°F | Resp 16 | Wt 191.0 lb

## 2013-04-13 DIAGNOSIS — F411 Generalized anxiety disorder: Secondary | ICD-10-CM

## 2013-04-13 DIAGNOSIS — M545 Low back pain: Secondary | ICD-10-CM

## 2013-04-13 DIAGNOSIS — I2510 Atherosclerotic heart disease of native coronary artery without angina pectoris: Secondary | ICD-10-CM

## 2013-04-13 DIAGNOSIS — E538 Deficiency of other specified B group vitamins: Secondary | ICD-10-CM

## 2013-04-13 DIAGNOSIS — Z23 Encounter for immunization: Secondary | ICD-10-CM

## 2013-04-13 DIAGNOSIS — E291 Testicular hypofunction: Secondary | ICD-10-CM

## 2013-04-13 DIAGNOSIS — I10 Essential (primary) hypertension: Secondary | ICD-10-CM

## 2013-04-13 LAB — TESTOSTERONE: Testosterone: 451.23 ng/dL (ref 350.00–890.00)

## 2013-04-13 LAB — BASIC METABOLIC PANEL
BUN: 18 mg/dL (ref 6–23)
Chloride: 103 mEq/L (ref 96–112)
GFR: 117.39 mL/min (ref >60.00)
Potassium: 4.4 mEq/L (ref 3.5–5.1)
Sodium: 138 mEq/L (ref 135–145)

## 2013-04-13 LAB — VITAMIN B12: Vitamin B-12: 431 pg/mL (ref 211–911)

## 2013-04-13 LAB — HEMOGLOBIN A1C: Hgb A1c MFr Bld: 6.9 % — ABNORMAL HIGH (ref 4.6–6.5)

## 2013-04-13 NOTE — Progress Notes (Signed)
Subjective:   C/o being irritable, stressed out C/o anxiety C/o LBP irrad to R hip -- in PT  F/u hypothyroidism The patient presents for a follow-up of  chronic hypertension, chronic dyslipidemia, type 2 diabetes controlled with medicines F/u pain and swelling in the L ulnar wrist worse w/playing music x months - worse F/u hearing loss, tinnitus - he got a hearing test - decided not to have a hearing aid    Review of Systems  Constitutional: Negative for appetite change, fatigue and unexpected weight change.  HENT: Negative for nosebleeds, congestion, sore throat, sneezing, trouble swallowing and neck pain.   Eyes: Negative for itching and visual disturbance.  Respiratory: Negative for cough.   Cardiovascular: Negative for chest pain, palpitations and leg swelling.  Gastrointestinal: Negative for nausea, diarrhea, blood in stool and abdominal distention.  Genitourinary: Negative for frequency and hematuria.  Musculoskeletal: Positive for arthralgias. Negative for myalgias, back pain, joint swelling and gait problem.  Skin: Negative for rash.  Neurological: Negative for dizziness, tremors, speech difficulty and weakness.  Psychiatric/Behavioral: Negative for sleep disturbance, dysphoric mood and agitation. The patient is not nervous/anxious.    Wt Readings from Last 3 Encounters:  04/13/13 191 lb (86.637 kg)  10/25/12 189 lb (85.73 kg)  09/30/12 189 lb (85.73 kg)   BP Readings from Last 3 Encounters:  04/13/13 140/82  10/25/12 126/85  09/30/12 138/80       BP 140/82  Pulse 72  Temp(Src) 97.1 F (36.2 C) (Oral)  Resp 16  Wt 191 lb (86.637 kg)     Objective:   Physical Exam  Constitutional: He is oriented to person, place, and time. He appears well-developed.  HENT:  Mouth/Throat: Oropharynx is clear and moist.  Eyes: Conjunctivae normal are normal. Pupils are equal, round, and reactive to light.  Neck: Normal range of motion. No JVD present. No thyromegaly  present.  Cardiovascular: Normal rate, regular rhythm, normal heart sounds and intact distal pulses.  Exam reveals no gallop and no friction rub.   No murmur heard. Pulmonary/Chest: Effort normal and breath sounds normal. No respiratory distress. He has no wheezes. He has no rales. He exhibits no tenderness.  Abdominal: Soft. Bowel sounds are normal. He exhibits no distension and no mass. There is no tenderness. There is no rebound and no guarding.  Musculoskeletal: Normal range of motion. He exhibits no edema and no tenderness.  Lymphadenopathy:    He has no cervical adenopathy.  Neurological: He is alert and oriented to person, place, and time. He has normal reflexes. No cranial nerve deficit. He exhibits normal muscle tone. Coordination normal.  Skin: Skin is warm and dry. No rash noted.  Psychiatric: He has a normal mood and affect. His behavior is normal. Judgment and thought content normal.    Lab Results  Component Value Date   WBC 9.3 05/21/2011   HGB 15.1 05/21/2011   HCT 44.0 05/21/2011   PLT 292.0 05/21/2011   GLUCOSE 94 12/15/2011   CHOL 233* 12/15/2011   TRIG 122.0 12/15/2011   HDL 68.70 12/15/2011   LDLDIRECT 149.6 12/15/2011   LDLCALC 119* 06/27/2010   ALT 18 12/15/2011   AST 18 12/15/2011   NA 139 12/15/2011   K 4.4 12/15/2011   CL 103 12/15/2011   CREATININE 0.8 12/15/2011   BUN 12 12/15/2011   CO2 28 12/15/2011   TSH 0.97 12/15/2011   PSA 0.29 12/02/2009   INR 0.98 06/03/2010   HGBA1C 6.6* 12/15/2011  Assessment & Plan:

## 2013-04-13 NOTE — Assessment & Plan Note (Signed)
Continue with current prescription therapy as reflected on the Med list.  

## 2013-04-13 NOTE — Progress Notes (Signed)
Pre visit review using our clinic review tool, if applicable. No additional management support is needed unless otherwise documented below in the visit note. 

## 2013-04-13 NOTE — Patient Instructions (Signed)

## 2013-04-13 NOTE — Assessment & Plan Note (Signed)
Labs  Continue with current prescription therapy as reflected on the Med list.  

## 2013-04-13 NOTE — Assessment & Plan Note (Signed)
Labs

## 2013-04-14 LAB — VITAMIN D 25 HYDROXY (VIT D DEFICIENCY, FRACTURES): Vit D, 25-Hydroxy: 52 ng/mL (ref 30–89)

## 2013-05-22 ENCOUNTER — Other Ambulatory Visit: Payer: Self-pay | Admitting: Internal Medicine

## 2013-06-15 ENCOUNTER — Telehealth: Payer: Self-pay | Admitting: *Deleted

## 2013-06-15 NOTE — Telephone Encounter (Signed)
Patient phoned stating that [facility] [address] sent prior auth request for his Cialis over the weekend and they haven't heard anything & cannot fill pt's script.  Please advise.   CB# [phone]

## 2013-06-15 NOTE — Telephone Encounter (Signed)
PA may take a long time to process.It is not necessarily delayed by the office. I haven't seen it yet Thx

## 2013-06-16 NOTE — Telephone Encounter (Signed)
PA sent to pt's Ins plan.

## 2013-06-26 ENCOUNTER — Telehealth: Payer: Self-pay | Admitting: Internal Medicine

## 2013-06-26 NOTE — Telephone Encounter (Signed)
Pt called stated that insurance comp still does not have paper work for [employer] of Cialis. Pt request phone call from the assistant about this. Please call pt.

## 2013-06-28 NOTE — Telephone Encounter (Signed)
Called [employer] to check status. Unable to speak to a rep. Will try again later.

## 2013-06-30 MED ORDER — TADALAFIL 20 MG PO TABS: 20.0000 mg | ORAL_TABLET | Freq: Every day | ORAL | Status: DC | PRN

## 2013-06-30 NOTE — Telephone Encounter (Signed)
I called [employer] again to check status. A automated message came on saying they are closed due to inclement weather. I called pt- Cialis 20 mg samples are upfront for him to p/u. Pt aware

## 2013-07-07 ENCOUNTER — Telehealth: Payer: Self-pay

## 2013-07-07 NOTE — Telephone Encounter (Signed)
The patient called and stated the insurance company is denying and Cialis rx.  He states they are authorizing viagra, however, this med causes blurred vision and side effects that the patient does not prefer.  He is hoping the medication can be called in and approved so he can continue using this medication.   Callback - [phone]

## 2013-07-25 ENCOUNTER — Other Ambulatory Visit: Payer: Self-pay

## 2013-07-25 MED ORDER — TADALAFIL 20 MG PO TABS: 20.0000 mg | ORAL_TABLET | Freq: Every day | ORAL | Status: DC | PRN

## 2013-08-08 ENCOUNTER — Encounter: Payer: Self-pay | Admitting: Internal Medicine

## 2013-08-08 ENCOUNTER — Ambulatory Visit (INDEPENDENT_AMBULATORY_CARE_PROVIDER_SITE_OTHER): Payer: BC Managed Care – PPO | Admitting: Internal Medicine

## 2013-08-08 VITALS — BP 128/80 | HR 65 | Temp 98.2°F | Wt 190.8 lb

## 2013-08-08 DIAGNOSIS — H9319 Tinnitus, unspecified ear: Secondary | ICD-10-CM

## 2013-08-08 DIAGNOSIS — Z23 Encounter for immunization: Secondary | ICD-10-CM

## 2013-08-08 DIAGNOSIS — L570 Actinic keratosis: Secondary | ICD-10-CM

## 2013-08-08 DIAGNOSIS — F32A Depression, unspecified: Secondary | ICD-10-CM | POA: Insufficient documentation

## 2013-08-08 DIAGNOSIS — I10 Essential (primary) hypertension: Secondary | ICD-10-CM

## 2013-08-08 DIAGNOSIS — E119 Type 2 diabetes mellitus without complications: Secondary | ICD-10-CM

## 2013-08-08 DIAGNOSIS — E538 Deficiency of other specified B group vitamins: Secondary | ICD-10-CM

## 2013-08-08 DIAGNOSIS — F329 Major depressive disorder, single episode, unspecified: Secondary | ICD-10-CM

## 2013-08-08 DIAGNOSIS — F3341 Major depressive disorder, recurrent, in partial remission: Secondary | ICD-10-CM | POA: Insufficient documentation

## 2013-08-08 DIAGNOSIS — F3289 Other specified depressive episodes: Secondary | ICD-10-CM

## 2013-08-08 MED ORDER — ESOMEPRAZOLE MAGNESIUM 40 MG PO CPDR: 40.0000 mg | DELAYED_RELEASE_CAPSULE | Freq: Every day | ORAL | Status: DC

## 2013-08-08 MED ORDER — TADALAFIL 20 MG PO TABS: 20.0000 mg | ORAL_TABLET | Freq: Every day | ORAL | Status: DC | PRN

## 2013-08-08 MED ORDER — LEVOMILNACIPRAN HCL ER 80 MG PO CP24: ORAL_CAPSULE | ORAL | Status: DC

## 2013-08-08 NOTE — Assessment & Plan Note (Signed)
Continue with current prescription therapy as reflected on the Med list. Labs  

## 2013-08-08 NOTE — Assessment & Plan Note (Signed)
Jared Alvarez would like to try a different antidepressant to see if it would help his tinnitus. We can try Fetzima 20mg x7d, 40x7, then 80 mg qd if tolerated

## 2013-08-08 NOTE — Progress Notes (Signed)
Pre visit review using our clinic review tool, if applicable. No additional management support is needed unless otherwise documented below in the visit note. 

## 2013-08-08 NOTE — Assessment & Plan Note (Signed)
Continue with current prescription therapy as reflected on the Med list.  

## 2013-08-08 NOTE — Patient Instructions (Signed)
   Postprocedure instructions :     Keep the wounds clean. You can wash them with liquid soap and water. Pat dry with gauze or a Kleenex tissue  Before applying antibiotic ointment and a Band-Aid.   You need to report immediately  if  any signs of infection develop.    

## 2013-08-08 NOTE — Assessment & Plan Note (Signed)
4/15 scalp - see Procedure

## 2013-08-08 NOTE — Assessment & Plan Note (Signed)
Jared Alvarez would like to try a different antidepressant to see if it would help. We can try Fetzima 20mg x7d, 40x7, then 80 mg qd if tolerated

## 2013-08-08 NOTE — Progress Notes (Signed)
Subjective:   C/o loosing voice when eating, coughing C/o being irritable, stressed out   C/o anxiety C/o LBP irrad to R hip -- in PT  F/u hypothyroidism The patient presents for a follow-up of  chronic hypertension, chronic dyslipidemia, type 2 diabetes controlled with medicines F/u pain and swelling in the L ulnar wrist worse w/playing music x months - worse F/u hearing loss, tinnitus - he got a hearing test - decided not to have a hearing aid    Review of Systems  Constitutional: Negative for appetite change, fatigue and unexpected weight change.  HENT: Negative for nosebleeds, congestion, sore throat, sneezing, trouble swallowing and neck pain.   Eyes: Negative for itching and visual disturbance.  Respiratory: Negative for cough.   Cardiovascular: Negative for chest pain, palpitations and leg swelling.  Gastrointestinal: Negative for nausea, diarrhea, blood in stool and abdominal distention.  Genitourinary: Negative for frequency and hematuria.  Musculoskeletal: Positive for arthralgias. Negative for myalgias, back pain, joint swelling and gait problem.  Skin: Negative for rash.  Neurological: Negative for dizziness, tremors, speech difficulty and weakness.  Psychiatric/Behavioral: Negative for sleep disturbance, dysphoric mood and agitation. The patient is not nervous/anxious.    Wt Readings from Last 3 Encounters:  08/08/13 190 lb 12 oz (86.524 kg)  04/13/13 191 lb (86.637 kg)  10/25/12 189 lb (85.73 kg)   BP Readings from Last 3 Encounters:  08/08/13 128/80  04/13/13 140/82  10/25/12 126/85       BP 128/80  Pulse 65  Temp(Src) 98.2 F (36.8 C) (Oral)  Wt 190 lb 12 oz (86.524 kg)  SpO2 96%     Objective:   Physical Exam  Constitutional: He is oriented to person, place, and time. He appears well-developed.  HENT:  Mouth/Throat: Oropharynx is clear and moist.  Eyes: Conjunctivae normal are normal. Pupils are equal, round, and reactive to light.  Neck:  Normal range of motion. No JVD present. No thyromegaly present.  Cardiovascular: Normal rate, regular rhythm, normal heart sounds and intact distal pulses.  Exam reveals no gallop and no friction rub.   No murmur heard. Pulmonary/Chest: Effort normal and breath sounds normal. No respiratory distress. He has no wheezes. He has no rales. He exhibits no tenderness.  Abdominal: Soft. Bowel sounds are normal. He exhibits no distension and no mass. There is no tenderness. There is no rebound and no guarding.  Musculoskeletal: Normal range of motion. He exhibits no edema and no tenderness.  Lymphadenopathy:    He has no cervical adenopathy.  Neurological: He is alert and oriented to person, place, and time. He has normal reflexes. No cranial nerve deficit. He exhibits normal muscle tone. Coordination normal.  Skin: Skin is warm and dry. No rash noted.  Psychiatric: He has a normal mood and affect. His behavior is normal. Judgment and thought content normal.  Scalp: 3 AKs  Lab Results  Component Value Date   WBC 9.3 05/21/2011   HGB 15.1 05/21/2011   HCT 44.0 05/21/2011   PLT 292.0 05/21/2011   GLUCOSE 94 12/15/2011   CHOL 233* 12/15/2011   TRIG 122.0 12/15/2011   HDL 68.70 12/15/2011   LDLDIRECT 149.6 12/15/2011   LDLCALC 119* 06/27/2010   ALT 18 12/15/2011   AST 18 12/15/2011   NA 139 12/15/2011   K 4.4 12/15/2011   CL 103 12/15/2011   CREATININE 0.8 12/15/2011   BUN 12 12/15/2011   CO2 28 12/15/2011   TSH 0.97 12/15/2011   PSA 0.29 12/02/2009  INR 0.98 06/03/2010   HGBA1C 6.6* 12/15/2011      Procedure Note :     Procedure : Cryosurgery   Indication:  Actinic keratosis(es)    Risks including unsuccessful procedure , bleeding, infection, bruising, scar, a need for a repeat  procedure and others were explained to the patient in detail as well as the benefits. Informed consent was obtained verbally.    3 lesion(s)  on   scalp was/were treated with liquid nitrogen on a Q-tip in a usual fasion .  Band-Aid was applied and antibiotic ointment was given for a later use.   Tolerated well. Complications none.   Postprocedure instructions :     Keep the wounds clean. You can wash them with liquid soap and water. Pat dry with gauze or a Kleenex tissue  Before applying antibiotic ointment and a Band-Aid.   You need to report immediately  if  any signs of infection develop.          Assessment & Plan:

## 2013-08-10 ENCOUNTER — Telehealth: Payer: Self-pay | Admitting: *Deleted

## 2013-08-10 NOTE — Telephone Encounter (Signed)
Rf req for Lorazepam mg 1 po bid prn anxiety. # 180. Last filled 05/31/2013. Ok to Rf?

## 2013-08-10 NOTE — Telephone Encounter (Signed)
OK to fill this prescription with additional refills x2 Thank you!  

## 2013-08-11 MED ORDER — LORAZEPAM 1 MG PO TABS: ORAL_TABLET | ORAL | Status: DC

## 2013-08-11 NOTE — Telephone Encounter (Signed)
Sent to pharmacy 

## 2013-08-21 ENCOUNTER — Telehealth: Payer: Self-pay

## 2013-08-21 ENCOUNTER — Other Ambulatory Visit: Payer: Self-pay | Admitting: Internal Medicine

## 2013-08-21 NOTE — Telephone Encounter (Signed)
Relevant patient education assigned to patient using Emmi. ° °

## 2013-09-05 ENCOUNTER — Encounter: Payer: Self-pay | Admitting: Internal Medicine

## 2013-09-14 ENCOUNTER — Encounter: Payer: Self-pay | Admitting: *Deleted

## 2013-09-20 ENCOUNTER — Ambulatory Visit (INDEPENDENT_AMBULATORY_CARE_PROVIDER_SITE_OTHER): Payer: BC Managed Care – PPO | Admitting: Psychology

## 2013-09-20 DIAGNOSIS — F341 Dysthymic disorder: Secondary | ICD-10-CM

## 2013-09-20 DIAGNOSIS — F411 Generalized anxiety disorder: Secondary | ICD-10-CM

## 2013-09-27 ENCOUNTER — Ambulatory Visit (INDEPENDENT_AMBULATORY_CARE_PROVIDER_SITE_OTHER): Payer: BC Managed Care – PPO | Admitting: Psychology

## 2013-09-27 DIAGNOSIS — F341 Dysthymic disorder: Secondary | ICD-10-CM

## 2013-09-27 DIAGNOSIS — F411 Generalized anxiety disorder: Secondary | ICD-10-CM

## 2013-09-28 ENCOUNTER — Ambulatory Visit (INDEPENDENT_AMBULATORY_CARE_PROVIDER_SITE_OTHER): Payer: BC Managed Care – PPO | Admitting: Internal Medicine

## 2013-09-28 ENCOUNTER — Encounter: Payer: Self-pay | Admitting: Internal Medicine

## 2013-09-28 VITALS — BP 130/80 | HR 53 | Temp 98.1°F | Wt 188.8 lb

## 2013-09-28 DIAGNOSIS — K219 Gastro-esophageal reflux disease without esophagitis: Secondary | ICD-10-CM | POA: Insufficient documentation

## 2013-09-28 DIAGNOSIS — F3289 Other specified depressive episodes: Secondary | ICD-10-CM

## 2013-09-28 DIAGNOSIS — N529 Male erectile dysfunction, unspecified: Secondary | ICD-10-CM | POA: Insufficient documentation

## 2013-09-28 DIAGNOSIS — E538 Deficiency of other specified B group vitamins: Secondary | ICD-10-CM

## 2013-09-28 DIAGNOSIS — F329 Major depressive disorder, single episode, unspecified: Secondary | ICD-10-CM

## 2013-09-28 DIAGNOSIS — E119 Type 2 diabetes mellitus without complications: Secondary | ICD-10-CM

## 2013-09-28 MED ORDER — LORAZEPAM 1 MG PO TABS: ORAL_TABLET | ORAL | Status: DC

## 2013-09-28 MED ORDER — TRAMADOL HCL 50 MG PO TABS: ORAL_TABLET | ORAL | Status: DC

## 2013-09-28 MED ORDER — PANTOPRAZOLE SODIUM 40 MG PO TBEC: 40.0000 mg | DELAYED_RELEASE_TABLET | Freq: Every day | ORAL | Status: DC

## 2013-09-28 NOTE — Assessment & Plan Note (Addendum)
Jared Alvarez has not started to take [facility] yet

## 2013-09-28 NOTE — Progress Notes (Signed)
Pre visit review using our clinic review tool, if applicable. No additional management support is needed unless otherwise documented below in the visit note. 

## 2013-09-28 NOTE — Patient Instructions (Signed)
Will try Protonix. Stop if rash

## 2013-09-28 NOTE — Assessment & Plan Note (Signed)
Gershon Mussel has adverse reactions to viagra - blurred vision, nasal passages close, not very effective - needs PA to use Cialis (PA is in progress)

## 2013-09-28 NOTE — Assessment & Plan Note (Signed)
Doing ok.

## 2013-09-28 NOTE — Progress Notes (Signed)
Subjective:  C/o adverse reactions to viagra - blurred vision, nasal passages close, not very effective - needs PA   F/u loosing voice when eating, coughing - better on Nexium 20 mg, had a rash on 40 mg F/u being irritable, stressed out. F/u anxiety - seeing Dr Cheryln Manly F/u LBP irrad to R hip --was in PT  F/u hypothyroidism The patient presents for a follow-up of  chronic hypertension, chronic dyslipidemia, type 2 diabetes controlled with medicines F/u pain and swelling in the L ulnar wrist worse w/playing music x months - worse F/u hearing loss, tinnitus - he got a hearing test - decided not to have a hearing aid      Review of Systems  Constitutional: Negative for appetite change, fatigue and unexpected weight change.  HENT: Negative for nosebleeds, congestion, sore throat, sneezing, trouble swallowing and neck pain.   Eyes: Negative for itching and visual disturbance.  Respiratory: Negative for cough.   Cardiovascular: Negative for chest pain, palpitations and leg swelling.  Gastrointestinal: Negative for nausea, diarrhea, blood in stool and abdominal distention.  Genitourinary: Negative for frequency and hematuria.  Musculoskeletal: Positive for arthralgias. Negative for myalgias, back pain, joint swelling and gait problem.  Skin: Negative for rash.  Neurological: Negative for dizziness, tremors, speech difficulty and weakness.  Psychiatric/Behavioral: Negative for sleep disturbance, dysphoric mood and agitation. The patient is not nervous/anxious.    Wt Readings from Last 3 Encounters:  09/28/13 188 lb 12 oz (85.616 kg)  08/08/13 190 lb 12 oz (86.524 kg)  04/13/13 191 lb (86.637 kg)   BP Readings from Last 3 Encounters:  09/28/13 130/80  08/08/13 128/80  04/13/13 140/82       BP 130/80  Pulse 53  Temp(Src) 98.1 F (36.7 C) (Oral)  Wt 188 lb 12 oz (85.616 kg)     Objective:   Physical Exam  Constitutional: He is oriented to person, place, and time. He  appears well-developed.  HENT:  Mouth/Throat: Oropharynx is clear and moist.  Eyes: Conjunctivae normal are normal. Pupils are equal, round, and reactive to light.  Neck: Normal range of motion. No JVD present. No thyromegaly present.  Cardiovascular: Normal rate, regular rhythm, normal heart sounds and intact distal pulses.  Exam reveals no gallop and no friction rub.   No murmur heard. Pulmonary/Chest: Effort normal and breath sounds normal. No respiratory distress. He has no wheezes. He has no rales. He exhibits no tenderness.  Abdominal: Soft. Bowel sounds are normal. He exhibits no distension and no mass. There is no tenderness. There is no rebound and no guarding.  Musculoskeletal: Normal range of motion. He exhibits no edema and no tenderness.  Lymphadenopathy:    He has no cervical adenopathy.  Neurological: He is alert and oriented to person, place, and time. He has normal reflexes. No cranial nerve deficit. He exhibits normal muscle tone. Coordination normal.  Skin: Skin is warm and dry. No rash noted.  Psychiatric: He has a normal mood and affect. His behavior is normal. Judgment and thought content normal.    Lab Results  Component Value Date   WBC 9.3 05/21/2011   HGB 15.1 05/21/2011   HCT 44.0 05/21/2011   PLT 292.0 05/21/2011   GLUCOSE 94 12/15/2011   CHOL 233* 12/15/2011   TRIG 122.0 12/15/2011   HDL 68.70 12/15/2011   LDLDIRECT 149.6 12/15/2011   LDLCALC 119* 06/27/2010   ALT 18 12/15/2011   AST 18 12/15/2011   NA 139 12/15/2011   K 4.4 12/15/2011  CL 103 12/15/2011   CREATININE 0.8 12/15/2011   BUN 12 12/15/2011   CO2 28 12/15/2011   TSH 0.97 12/15/2011   PSA 0.29 12/02/2009   INR 0.98 06/03/2010   HGBA1C 6.6* 12/15/2011      Assessment & Plan:

## 2013-09-28 NOTE — Assessment & Plan Note (Signed)
Continue with current prescription therapy as reflected on the Med list.  

## 2013-09-28 NOTE — Assessment & Plan Note (Addendum)
W/hoarsness -- PPI helps Nexium - rash  Will try Protonix. Stop if rash

## 2013-10-04 ENCOUNTER — Ambulatory Visit (INDEPENDENT_AMBULATORY_CARE_PROVIDER_SITE_OTHER): Payer: BC Managed Care – PPO | Admitting: Psychology

## 2013-10-04 DIAGNOSIS — F341 Dysthymic disorder: Secondary | ICD-10-CM

## 2013-10-04 DIAGNOSIS — F411 Generalized anxiety disorder: Secondary | ICD-10-CM

## 2013-10-06 ENCOUNTER — Telehealth: Payer: Self-pay | Admitting: *Deleted

## 2013-10-06 NOTE — Telephone Encounter (Signed)
[place] PA sent to pt's plan. Will wait for insurance decision. Pt ID: [id number]

## 2013-10-18 ENCOUNTER — Ambulatory Visit (INDEPENDENT_AMBULATORY_CARE_PROVIDER_SITE_OTHER): Payer: BC Managed Care – PPO | Admitting: Psychology

## 2013-10-18 DIAGNOSIS — F341 Dysthymic disorder: Secondary | ICD-10-CM

## 2013-10-18 DIAGNOSIS — F411 Generalized anxiety disorder: Secondary | ICD-10-CM

## 2013-10-26 ENCOUNTER — Ambulatory Visit (INDEPENDENT_AMBULATORY_CARE_PROVIDER_SITE_OTHER): Payer: BC Managed Care – PPO | Admitting: Psychology

## 2013-10-26 DIAGNOSIS — F411 Generalized anxiety disorder: Secondary | ICD-10-CM

## 2013-10-26 DIAGNOSIS — F341 Dysthymic disorder: Secondary | ICD-10-CM

## 2013-10-31 ENCOUNTER — Telehealth: Payer: Self-pay | Admitting: *Deleted

## 2013-10-31 ENCOUNTER — Ambulatory Visit: Payer: BC Managed Care – PPO | Admitting: Psychology

## 2013-10-31 DIAGNOSIS — E119 Type 2 diabetes mellitus without complications: Secondary | ICD-10-CM

## 2013-10-31 NOTE — Telephone Encounter (Signed)
Patient to come to lab for lipid, a1c,bmet. Orders placed.

## 2013-11-07 ENCOUNTER — Ambulatory Visit (INDEPENDENT_AMBULATORY_CARE_PROVIDER_SITE_OTHER): Payer: BC Managed Care – PPO | Admitting: Psychology

## 2013-11-07 ENCOUNTER — Other Ambulatory Visit (INDEPENDENT_AMBULATORY_CARE_PROVIDER_SITE_OTHER): Payer: BC Managed Care – PPO

## 2013-11-07 DIAGNOSIS — F341 Dysthymic disorder: Secondary | ICD-10-CM

## 2013-11-07 DIAGNOSIS — E119 Type 2 diabetes mellitus without complications: Secondary | ICD-10-CM

## 2013-11-07 DIAGNOSIS — F411 Generalized anxiety disorder: Secondary | ICD-10-CM

## 2013-11-07 LAB — BASIC METABOLIC PANEL
BUN: 17 mg/dL (ref 6–23)
CO2: 29 mEq/L (ref 19–32)
Calcium: 10 mg/dL (ref 8.4–10.5)
Chloride: 104 mEq/L (ref 96–112)
Creatinine, Ser: 0.6 mg/dL (ref 0.4–1.5)
GFR: 144.61 mL/min (ref >60.00)
Glucose, Bld: 177 mg/dL — ABNORMAL HIGH (ref 70–99)
POTASSIUM: 4.8 meq/L (ref 3.5–5.1)
SODIUM: 140 meq/L (ref 135–145)

## 2013-11-07 LAB — LIPID PANEL
Cholesterol: 143 mg/dL (ref 0–200)
HDL: 64.2 mg/dL (ref >39.00)
LDL Cholesterol: 68 mg/dL (ref 0–99)
NONHDL: 78.8
Total CHOL/HDL Ratio: 2
Triglycerides: 53 mg/dL (ref 0.0–149.0)
VLDL: 10.6 mg/dL (ref 0.0–40.0)

## 2013-11-07 LAB — HEMOGLOBIN A1C: Hgb A1c MFr Bld: 7 % — ABNORMAL HIGH (ref 4.6–6.5)

## 2013-11-15 ENCOUNTER — Encounter: Payer: Self-pay | Admitting: *Deleted

## 2013-11-16 ENCOUNTER — Ambulatory Visit (INDEPENDENT_AMBULATORY_CARE_PROVIDER_SITE_OTHER): Payer: BC Managed Care – PPO | Admitting: Cardiovascular Disease

## 2013-11-16 ENCOUNTER — Encounter: Payer: Self-pay | Admitting: Cardiovascular Disease

## 2013-11-16 VITALS — BP 138/70 | HR 63 | Ht 68.0 in | Wt 185.0 lb

## 2013-11-16 DIAGNOSIS — E119 Type 2 diabetes mellitus without complications: Secondary | ICD-10-CM

## 2013-11-16 DIAGNOSIS — E785 Hyperlipidemia, unspecified: Secondary | ICD-10-CM

## 2013-11-16 DIAGNOSIS — I10 Essential (primary) hypertension: Secondary | ICD-10-CM

## 2013-11-16 DIAGNOSIS — R0609 Other forms of dyspnea: Secondary | ICD-10-CM

## 2013-11-16 DIAGNOSIS — R0989 Other specified symptoms and signs involving the circulatory and respiratory systems: Secondary | ICD-10-CM

## 2013-11-16 DIAGNOSIS — R0600 Dyspnea, unspecified: Secondary | ICD-10-CM

## 2013-11-16 DIAGNOSIS — I2510 Atherosclerotic heart disease of native coronary artery without angina pectoris: Secondary | ICD-10-CM

## 2013-11-16 NOTE — Assessment & Plan Note (Signed)
Well controlled.  Continue current medications and low sodium Dash type diet.    

## 2013-11-16 NOTE — Assessment & Plan Note (Signed)
Cholesterol is at goal.  Continue current dose of statin and diet Rx.  No myalgias or side effects.  F/U  LFT's in 6 months. Lab Results  Component Value Date   LDLCALC 68 11/07/2013

## 2013-11-16 NOTE — Assessment & Plan Note (Signed)
Discussed low carb diet.  Target hemoglobin A1c is 6.5 or less.  Continue current medications.  

## 2013-11-16 NOTE — Assessment & Plan Note (Signed)
No chest pain normal ECG  Mild exertional dyspnea In diabetic no testing in 3 years  F/u ETT October after festival

## 2013-11-16 NOTE — Patient Instructions (Signed)
Your physician has requested that you have an exercise tolerance test. For further information please visit [email or web address]. Please also follow instruction sheet, as given.  DUE IN October  Your physician recommends that you continue on your current medications as directed. Please refer to the Current Medication list given to you today.

## 2013-11-16 NOTE — Progress Notes (Signed)
Patient ID: Jared Alvarez, male   DOB: [birth year], 63 y.o.   MRN: [id number] Jared Alvarez is a 63 y.o. male presents for evaluation of chest pain and palpitations. He has a h/o CAD, s/p stent to the LAD in 1997 in [place]. Also has DM2, HTN, HLP Gout, depression, anxiety, hypogonadism. He established with me in 3/12. Saw PA October 2012 with chest pain and had benign myovue and echo Some stress running [employer] but otherwise doing well Taking crestor 3-4 x/week and wonders if it is affecting his memory.  Last myovue : 02/12/11 reviewed Normal no ishcemia EF 56%  Echo: 02/12/11 reviewed  Study Conclusions  - Left ventricle: The cavity size was normal. Wall thickness was increased in a pattern of mild LVH. Systolic function was normal. The estimated ejection fraction was in the range of 55% to 60%. Wall motion was normal; there were no regional wall motion abnormalities. - Aortic valve: Trivial regurgitation. - Left atrium: The atrium was mildly dilated. - Pulmonary arteries: Systolic pressure was mildly increased. PA peak pressure: 24mm Hg (S).  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  All other systems reviewed and negative  General: Affect appropriate Healthy:  appears stated age 5: normal Neck supple with no adenopathy JVP normal no bruits no thyromegaly Lungs clear with no wheezing and good diaphragmatic motion Heart:  S1/S2 no murmur, no rub, gallop or click PMI normal Abdomen: benighn, BS positve, no tenderness, no AAA no bruit.  No HSM or HJR Distal pulses intact with no  bruits No edema Neuro non-focal Skin warm and dry No muscular weakness   Current Outpatient Prescriptions  Medication Sig Dispense Refill  . amLODipine (NORVASC) 2.5 MG tablet TAKE ONE TABLET BY MOUTH DAILY  90 tablet  0  . Ascorbic Acid (VITAMIN C) 1000 MG tablet Take 1,000 mg by mouth daily.        [name] aspirin 81 MG tablet Take 81 mg by mouth daily.        . colchicine 0.6 MG tablet Take 1 tablet (0.6 mg total) by mouth as needed. If patient has flare up with gout  30 tablet  5  . CRESTOR 40 MG tablet TAKE ONE TABLET BY MOUTH EVERY DAY  90 tablet  1  . DIOVAN 320 MG tablet TAKE ONE TABLET BY MOUTH DAILY.  90 tablet  2  . FLUoxetine (PROZAC) 20 MG capsule Take 1 capsule by mouth daily.      . Linaclotide (LINZESS) 290 MCG CAPS capsule Take 1 capsule by mouth as needed.      [name] LORazepam (ATIVAN) 1 MG tablet TAKE ONE TABLET BY MOUTH TWICE DAILY AS NEEDED FOR ANXIETY  180 tablet  1  . metFORMIN (GLUCOPHAGE) 1000 MG tablet TAKE ONE TABLET BY MOUTH TWICE DAILY WITH FOOD  180 tablet  2  . pantoprazole (PROTONIX) 40 MG tablet Take 1 tablet (40 mg total) by mouth daily.  30 tablet  5  . traMADol (ULTRAM) 50 MG tablet TAKE ONE TO TWO TABLETS BY MOUTH TWICE DAILY AS NEEDED FOR PAIN.  60 tablet  2   No current facility-administered medications  for this visit.    Allergies  Atorvastatin; Bupropion hcl; Duloxetine; Fluoxetine hcl; Nexium; Simvastatin; Venlafaxine; and Viibryd  Electrocardiogram:  10/25/12  SR rate 50 normal  Today NSR rate 60 normal   Assessment and Plan

## 2013-11-20 ENCOUNTER — Other Ambulatory Visit: Payer: Self-pay | Admitting: Internal Medicine

## 2013-11-22 ENCOUNTER — Ambulatory Visit (INDEPENDENT_AMBULATORY_CARE_PROVIDER_SITE_OTHER): Payer: BC Managed Care – PPO | Admitting: Psychology

## 2013-11-22 DIAGNOSIS — F411 Generalized anxiety disorder: Secondary | ICD-10-CM

## 2013-11-22 DIAGNOSIS — F341 Dysthymic disorder: Secondary | ICD-10-CM

## 2013-11-27 ENCOUNTER — Ambulatory Visit (INDEPENDENT_AMBULATORY_CARE_PROVIDER_SITE_OTHER): Payer: BC Managed Care – PPO | Admitting: Psychology

## 2013-11-27 DIAGNOSIS — F411 Generalized anxiety disorder: Secondary | ICD-10-CM

## 2013-11-27 DIAGNOSIS — F341 Dysthymic disorder: Secondary | ICD-10-CM

## 2013-11-29 ENCOUNTER — Ambulatory Visit: Payer: BC Managed Care – PPO | Admitting: Psychology

## 2014-01-05 ENCOUNTER — Ambulatory Visit (INDEPENDENT_AMBULATORY_CARE_PROVIDER_SITE_OTHER): Payer: BC Managed Care – PPO | Admitting: Psychology

## 2014-01-05 DIAGNOSIS — F409 Phobic anxiety disorder, unspecified: Secondary | ICD-10-CM

## 2014-01-05 DIAGNOSIS — F341 Dysthymic disorder: Secondary | ICD-10-CM

## 2014-01-26 ENCOUNTER — Other Ambulatory Visit (INDEPENDENT_AMBULATORY_CARE_PROVIDER_SITE_OTHER): Payer: BC Managed Care – PPO

## 2014-01-26 ENCOUNTER — Encounter: Payer: Self-pay | Admitting: Internal Medicine

## 2014-01-26 ENCOUNTER — Ambulatory Visit (INDEPENDENT_AMBULATORY_CARE_PROVIDER_SITE_OTHER): Payer: BC Managed Care – PPO | Admitting: Internal Medicine

## 2014-01-26 VITALS — BP 142/88 | HR 52 | Temp 98.3°F | Wt 180.0 lb

## 2014-01-26 DIAGNOSIS — E538 Deficiency of other specified B group vitamins: Secondary | ICD-10-CM

## 2014-01-26 DIAGNOSIS — E119 Type 2 diabetes mellitus without complications: Secondary | ICD-10-CM

## 2014-01-26 DIAGNOSIS — F3289 Other specified depressive episodes: Secondary | ICD-10-CM

## 2014-01-26 DIAGNOSIS — R5383 Other fatigue: Secondary | ICD-10-CM

## 2014-01-26 DIAGNOSIS — I2510 Atherosclerotic heart disease of native coronary artery without angina pectoris: Secondary | ICD-10-CM

## 2014-01-26 DIAGNOSIS — I10 Essential (primary) hypertension: Secondary | ICD-10-CM

## 2014-01-26 DIAGNOSIS — Z23 Encounter for immunization: Secondary | ICD-10-CM

## 2014-01-26 DIAGNOSIS — F329 Major depressive disorder, single episode, unspecified: Secondary | ICD-10-CM

## 2014-01-26 DIAGNOSIS — R5381 Other malaise: Secondary | ICD-10-CM

## 2014-01-26 LAB — BASIC METABOLIC PANEL
BUN: 18 mg/dL (ref 6–23)
CALCIUM: 9.8 mg/dL (ref 8.4–10.5)
CO2: 28 mEq/L (ref 19–32)
Chloride: 103 mEq/L (ref 96–112)
Creatinine, Ser: 0.7 mg/dL (ref 0.4–1.5)
GFR: 122.98 mL/min (ref >60.00)
Glucose, Bld: 152 mg/dL — ABNORMAL HIGH (ref 70–99)
POTASSIUM: 4.1 meq/L (ref 3.5–5.1)
Sodium: 138 mEq/L (ref 135–145)

## 2014-01-26 LAB — TSH: TSH: 1.13 u[IU]/mL (ref 0.35–4.50)

## 2014-01-26 LAB — HEPATIC FUNCTION PANEL
ALBUMIN: 4.3 g/dL (ref 3.5–5.2)
ALT: 19 U/L (ref 0–53)
AST: 20 U/L (ref 0–37)
Alkaline Phosphatase: 72 U/L (ref 39–117)
Bilirubin, Direct: 0.2 mg/dL (ref 0.0–0.3)
Total Bilirubin: 1.1 mg/dL (ref 0.2–1.2)
Total Protein: 7.1 g/dL (ref 6.0–8.3)

## 2014-01-26 LAB — CBC WITH DIFFERENTIAL/PLATELET
Basophils Absolute: 0 10*3/uL (ref 0.0–0.1)
Basophils Relative: 0.3 % (ref 0.0–3.0)
Eosinophils Absolute: 0.1 10*3/uL (ref 0.0–0.7)
Eosinophils Relative: 1.1 % (ref 0.0–5.0)
HCT: 40.2 % (ref 39.0–52.0)
Hemoglobin: 13.8 g/dL (ref 13.0–17.0)
Lymphocytes Relative: 17.3 % (ref 12.0–46.0)
Lymphs Abs: 1.1 10*3/uL (ref 0.7–4.0)
MCHC: 34.2 g/dL (ref 30.0–36.0)
MCV: 94.7 fl (ref 78.0–100.0)
MONO ABS: 0.5 10*3/uL (ref 0.1–1.0)
Monocytes Relative: 8.6 % (ref 3.0–12.0)
NEUTROS PCT: 72.7 % (ref 43.0–77.0)
Neutro Abs: 4.4 10*3/uL (ref 1.4–7.7)
PLATELETS: 211 10*3/uL (ref 150.0–400.0)
RBC: 4.25 Mil/uL (ref 4.22–5.81)
RDW: 13.6 % (ref 11.5–15.5)
WBC: 6.1 10*3/uL (ref 4.0–10.5)

## 2014-01-26 LAB — VITAMIN B12: Vitamin B-12: 378 pg/mL (ref 211–911)

## 2014-01-26 NOTE — Assessment & Plan Note (Signed)
No angina 

## 2014-01-26 NOTE — Assessment & Plan Note (Signed)
Labs

## 2014-01-26 NOTE — Assessment & Plan Note (Signed)
Continue with current prescription therapy as reflected on the Med list.  

## 2014-01-26 NOTE — Progress Notes (Signed)
Subjective:  C/o dizziness, fatigue, drowsy sometimes   F/u loosing voice when eating, coughing - better on Nexium 20 mg, had a rash on 40 mg F/u being irritable, stressed out. F/u anxiety - seeing Dr Cheryln Manly F/u LBP irrad to R hip --was in PT  F/u hypothyroidism The patient presents for a follow-up of  chronic hypertension, chronic dyslipidemia, type 2 diabetes controlled with medicines F/u pain and swelling in the L ulnar wrist worse w/playing music x months - worse F/u hearing loss, tinnitus - he got a hearing test - decided not to have a hearing aid      Review of Systems  Constitutional: Negative for appetite change, fatigue and unexpected weight change.  HENT: Negative for nosebleeds, congestion, sore throat, sneezing, trouble swallowing and neck pain.   Eyes: Negative for itching and visual disturbance.  Respiratory: Negative for cough.   Cardiovascular: Negative for chest pain, palpitations and leg swelling.  Gastrointestinal: Negative for nausea, diarrhea, blood in stool and abdominal distention.  Genitourinary: Negative for frequency and hematuria.  Musculoskeletal: Positive for arthralgias. Negative for myalgias, back pain, joint swelling and gait problem.  Skin: Negative for rash.  Neurological: Negative for dizziness, tremors, speech difficulty and weakness.  Psychiatric/Behavioral: Negative for sleep disturbance, dysphoric mood and agitation. The patient is not nervous/anxious.    Wt Readings from Last 3 Encounters:  01/26/14 180 lb (81.647 kg)  11/16/13 185 lb (83.915 kg)  09/28/13 188 lb 12 oz (85.616 kg)   BP Readings from Last 3 Encounters:  01/26/14 142/88  11/16/13 138/70  09/28/13 130/80       BP 142/88  Pulse 52  Temp(Src) 98.3 F (36.8 C) (Oral)  Wt 180 lb (81.647 kg)  SpO2 97%     Objective:   Physical Exam  Constitutional: He is oriented to person, place, and time. He appears well-developed.  HENT:  Mouth/Throat: Oropharynx is  clear and moist.  Eyes: Conjunctivae normal are normal. Pupils are equal, round, and reactive to light.  Neck: Normal range of motion. No JVD present. No thyromegaly present.  Cardiovascular: Normal rate, regular rhythm, normal heart sounds and intact distal pulses.  Exam reveals no gallop and no friction rub.   No murmur heard. Pulmonary/Chest: Effort normal and breath sounds normal. No respiratory distress. He has no wheezes. He has no rales. He exhibits no tenderness.  Abdominal: Soft. Bowel sounds are normal. He exhibits no distension and no mass. There is no tenderness. There is no rebound and no guarding.  Musculoskeletal: Normal range of motion. He exhibits no edema and no tenderness.  Lymphadenopathy:    He has no cervical adenopathy.  Neurological: He is alert and oriented to person, place, and time. He has normal reflexes. No cranial nerve deficit. He exhibits normal muscle tone. Coordination normal.  Skin: Skin is warm and dry. No rash noted.  Psychiatric: He has a normal mood and affect. His behavior is normal. Judgment and thought content normal.    Lab Results  Component Value Date   WBC 9.3 05/21/2011   HGB 15.1 05/21/2011   HCT 44.0 05/21/2011   PLT 292.0 05/21/2011   GLUCOSE 94 12/15/2011   CHOL 233* 12/15/2011   TRIG 122.0 12/15/2011   HDL 68.70 12/15/2011   LDLDIRECT 149.6 12/15/2011   LDLCALC 119* 06/27/2010   ALT 18 12/15/2011   AST 18 12/15/2011   NA 139 12/15/2011   K 4.4 12/15/2011   CL 103 12/15/2011   CREATININE 0.8 12/15/2011   BUN  12 12/15/2011   CO2 28 12/15/2011   TSH 0.97 12/15/2011   PSA 0.29 12/02/2009   INR 0.98 06/03/2010   HGBA1C 6.6* 12/15/2011      Assessment & Plan:

## 2014-01-26 NOTE — Assessment & Plan Note (Signed)
Check CBG at home prn - Onetouch Verio

## 2014-01-26 NOTE — Progress Notes (Signed)
Pre visit review using our clinic review tool, if applicable. No additional management support is needed unless otherwise documented below in the visit note. 

## 2014-01-27 ENCOUNTER — Other Ambulatory Visit: Payer: Self-pay | Admitting: Internal Medicine

## 2014-02-19 ENCOUNTER — Ambulatory Visit (INDEPENDENT_AMBULATORY_CARE_PROVIDER_SITE_OTHER): Payer: BC Managed Care – PPO | Admitting: Psychology

## 2014-02-19 DIAGNOSIS — F411 Generalized anxiety disorder: Secondary | ICD-10-CM

## 2014-02-19 DIAGNOSIS — F341 Dysthymic disorder: Secondary | ICD-10-CM

## 2014-02-22 ENCOUNTER — Encounter: Payer: BC Managed Care – PPO | Admitting: Physician Assistant

## 2014-02-26 ENCOUNTER — Ambulatory Visit: Payer: BC Managed Care – PPO | Admitting: Psychology

## 2014-02-27 ENCOUNTER — Other Ambulatory Visit: Payer: Self-pay | Admitting: Internal Medicine

## 2014-02-28 ENCOUNTER — Ambulatory Visit (INDEPENDENT_AMBULATORY_CARE_PROVIDER_SITE_OTHER): Payer: BC Managed Care – PPO | Admitting: Psychology

## 2014-02-28 DIAGNOSIS — F341 Dysthymic disorder: Secondary | ICD-10-CM

## 2014-02-28 DIAGNOSIS — F411 Generalized anxiety disorder: Secondary | ICD-10-CM

## 2014-03-07 ENCOUNTER — Ambulatory Visit (INDEPENDENT_AMBULATORY_CARE_PROVIDER_SITE_OTHER): Payer: BC Managed Care – PPO | Admitting: Psychology

## 2014-03-07 DIAGNOSIS — F411 Generalized anxiety disorder: Secondary | ICD-10-CM

## 2014-03-07 DIAGNOSIS — F341 Dysthymic disorder: Secondary | ICD-10-CM

## 2014-03-20 ENCOUNTER — Ambulatory Visit (INDEPENDENT_AMBULATORY_CARE_PROVIDER_SITE_OTHER): Payer: BC Managed Care – PPO | Admitting: Psychology

## 2014-03-20 DIAGNOSIS — F341 Dysthymic disorder: Secondary | ICD-10-CM

## 2014-03-20 DIAGNOSIS — F411 Generalized anxiety disorder: Secondary | ICD-10-CM

## 2014-03-22 ENCOUNTER — Encounter: Payer: Self-pay | Admitting: Physician Assistant

## 2014-04-04 ENCOUNTER — Encounter: Payer: BC Managed Care – PPO | Admitting: Physician Assistant

## 2014-04-04 ENCOUNTER — Ambulatory Visit (INDEPENDENT_AMBULATORY_CARE_PROVIDER_SITE_OTHER): Payer: BC Managed Care – PPO | Admitting: Psychology

## 2014-04-04 DIAGNOSIS — F341 Dysthymic disorder: Secondary | ICD-10-CM

## 2014-04-04 DIAGNOSIS — F411 Generalized anxiety disorder: Secondary | ICD-10-CM

## 2014-04-06 ENCOUNTER — Other Ambulatory Visit: Payer: Self-pay | Admitting: Internal Medicine

## 2014-04-12 ENCOUNTER — Ambulatory Visit (INDEPENDENT_AMBULATORY_CARE_PROVIDER_SITE_OTHER): Payer: BC Managed Care – PPO | Admitting: Psychology

## 2014-04-12 DIAGNOSIS — F341 Dysthymic disorder: Secondary | ICD-10-CM

## 2014-04-12 DIAGNOSIS — F411 Generalized anxiety disorder: Secondary | ICD-10-CM

## 2014-04-17 ENCOUNTER — Ambulatory Visit: Payer: BC Managed Care – PPO | Admitting: Psychology

## 2014-04-17 ENCOUNTER — Ambulatory Visit (INDEPENDENT_AMBULATORY_CARE_PROVIDER_SITE_OTHER): Payer: BC Managed Care – PPO | Admitting: Psychology

## 2014-04-17 DIAGNOSIS — F341 Dysthymic disorder: Secondary | ICD-10-CM

## 2014-04-17 DIAGNOSIS — F411 Generalized anxiety disorder: Secondary | ICD-10-CM

## 2014-04-18 ENCOUNTER — Encounter: Payer: Self-pay | Admitting: Internal Medicine

## 2014-04-18 ENCOUNTER — Other Ambulatory Visit: Payer: Self-pay | Admitting: Internal Medicine

## 2014-04-18 ENCOUNTER — Ambulatory Visit (INDEPENDENT_AMBULATORY_CARE_PROVIDER_SITE_OTHER): Payer: BC Managed Care – PPO | Admitting: Internal Medicine

## 2014-04-18 VITALS — BP 152/82 | HR 69 | Temp 98.5°F | Resp 16 | Ht 67.0 in | Wt 187.0 lb

## 2014-04-18 DIAGNOSIS — F32A Depression, unspecified: Secondary | ICD-10-CM

## 2014-04-18 DIAGNOSIS — I10 Essential (primary) hypertension: Secondary | ICD-10-CM

## 2014-04-18 DIAGNOSIS — E1151 Type 2 diabetes mellitus with diabetic peripheral angiopathy without gangrene: Secondary | ICD-10-CM

## 2014-04-18 DIAGNOSIS — F329 Major depressive disorder, single episode, unspecified: Secondary | ICD-10-CM

## 2014-04-18 DIAGNOSIS — E1159 Type 2 diabetes mellitus with other circulatory complications: Secondary | ICD-10-CM

## 2014-04-18 DIAGNOSIS — Z23 Encounter for immunization: Secondary | ICD-10-CM

## 2014-04-18 DIAGNOSIS — D489 Neoplasm of uncertain behavior, unspecified: Secondary | ICD-10-CM

## 2014-04-18 MED ORDER — FLUOXETINE HCL 40 MG PO CAPS: 40.0000 mg | ORAL_CAPSULE | Freq: Every day | ORAL | Status: DC

## 2014-04-18 MED ORDER — RANITIDINE HCL 300 MG PO TABS: 300.0000 mg | ORAL_TABLET | Freq: Every day | ORAL | Status: DC

## 2014-04-18 NOTE — Patient Instructions (Signed)
Postprocedure instructions :    A Band-Aid should be  changed twice daily. You can take a shower tomorrow.  Keep the wounds clean. You can wash them with liquid soap and water. Pat dry with gauze or a Kleenex tissue  Before applying antibiotic ointment and a Band-Aid.   You need to report immediately  if fever, chills or any signs of infection develop.    The biopsy results should be available in 1 -2 weeks. 

## 2014-04-18 NOTE — Progress Notes (Signed)
Pre visit review using our clinic review tool, if applicable. No additional management support is needed unless otherwise documented below in the visit note. 

## 2014-04-18 NOTE — Progress Notes (Signed)
Subjective:  C/o mole  On L leg F/u dizziness, fatigue, drowsy sometimes   F/u loosing voice when eating, coughing - better on Nexium 20 mg, had a rash on 40 mg F/u being irritable, stressed out. F/u anxiety - seeing Dr Cheryln Manly F/u LBP irrad to R hip --was in PT  F/u hypothyroidism The patient presents for a follow-up of  chronic hypertension, chronic dyslipidemia, type 2 diabetes controlled with medicines F/u pain and swelling in the L ulnar wrist worse w/playing music x months - worse F/u hearing loss, tinnitus - he got a hearing test - decided not to have a hearing aid      Review of Systems  Constitutional: Negative for appetite change, fatigue and unexpected weight change.  HENT: Negative for nosebleeds, congestion, sore throat, sneezing, trouble swallowing and neck pain.   Eyes: Negative for itching and visual disturbance.  Respiratory: Negative for cough.   Cardiovascular: Negative for chest pain, palpitations and leg swelling.  Gastrointestinal: Negative for nausea, diarrhea, blood in stool and abdominal distention.  Genitourinary: Negative for frequency and hematuria.  Musculoskeletal: Positive for arthralgias. Negative for myalgias, back pain, joint swelling and gait problem.  Skin: Negative for rash.  Neurological: Negative for dizziness, tremors, speech difficulty and weakness.  Psychiatric/Behavioral: Negative for sleep disturbance, dysphoric mood and agitation. The patient is not nervous/anxious.    Wt Readings from Last 3 Encounters:  04/18/14 187 lb (84.823 kg)  01/26/14 180 lb (81.647 kg)  11/16/13 185 lb (83.915 kg)   BP Readings from Last 3 Encounters:  04/18/14 152/82  01/26/14 142/88  11/16/13 138/70       BP 152/82 mmHg  Pulse 69  Temp(Src) 98.5 F (36.9 C) (Oral)  Resp 16  Ht 5\' 7"  (1.702 m)  Wt 187 lb (84.823 kg)  BMI 29.28 kg/m2  SpO2 96%     Objective:   Physical Exam  Constitutional: He is oriented to person, place, and time.  He appears well-developed.  HENT:  Mouth/Throat: Oropharynx is clear and moist.  Eyes: Conjunctivae normal are normal. Pupils are equal, round, and reactive to light.  Neck: Normal range of motion. No JVD present. No thyromegaly present.  Cardiovascular: Normal rate, regular rhythm, normal heart sounds and intact distal pulses.  Exam reveals no gallop and no friction rub.   No murmur heard. Pulmonary/Chest: Effort normal and breath sounds normal. No respiratory distress. He has no wheezes. He has no rales. He exhibits no tenderness.  Abdominal: Soft. Bowel sounds are normal. He exhibits no distension and no mass. There is no tenderness. There is no rebound and no guarding.  Musculoskeletal: Normal range of motion. He exhibits no edema and no tenderness.  Lymphadenopathy:    He has no cervical adenopathy.  Neurological: He is alert and oriented to person, place, and time. He has normal reflexes. No cranial nerve deficit. He exhibits normal muscle tone. Coordination normal.  Skin: Skin is warm and dry. No rash noted.  Psychiatric: He has a normal mood and affect. His behavior is normal. Judgment and thought content normal.  11x7 mm L shin pigmented lesion  Lab Results  Component Value Date   WBC 9.3 05/21/2011   HGB 15.1 05/21/2011   HCT 44.0 05/21/2011   PLT 292.0 05/21/2011   GLUCOSE 94 12/15/2011   CHOL 233* 12/15/2011   TRIG 122.0 12/15/2011   HDL 68.70 12/15/2011   LDLDIRECT 149.6 12/15/2011   LDLCALC 119* 06/27/2010   ALT 18 12/15/2011   AST 18 12/15/2011  NA 139 12/15/2011   K 4.4 12/15/2011   CL 103 12/15/2011   CREATININE 0.8 12/15/2011   BUN 12 12/15/2011   CO2 28 12/15/2011   TSH 0.97 12/15/2011   PSA 0.29 12/02/2009   INR 0.98 06/03/2010   HGBA1C 6.6* 12/15/2011      Procedure Note :     Procedure :  Skin biopsy   Indication:  Changing mole (s ) L shin   Risks including unsuccessful procedure , bleeding, infection, bruising, scar, a need for another complete procedure and others  were explained to the patient in detail as well as the benefits. Informed consent was obtained and signed.   The patient was placed in a decubitus position.  Lesion #1 on  L shin   Measuring 11x7 mm   Skin over lesion #1  was prepped with Betadine and alcohol  and anesthetized with 1 cc of 2% lidocaine and epinephrine, using a 25-gauge 1 inch needle.  Shave biopsy with a sterile Dermablade was carried out in the usual fashion. Hyfrecator was used to destroy the rest of the lesion potentially left behind and for hemostasis. Band-Aid was applied with antibiotic ointment.   Tolerated well. Complications none.   Assessment & Plan:

## 2014-04-23 ENCOUNTER — Encounter: Payer: Self-pay | Admitting: Internal Medicine

## 2014-04-23 NOTE — Assessment & Plan Note (Signed)
Worse Prozac dose was increased

## 2014-04-23 NOTE — Assessment & Plan Note (Signed)
Continue with current prescription therapy as reflected on the Med list.  

## 2014-04-23 NOTE — Assessment & Plan Note (Signed)
Continue with current prescription therapy as reflected on the Med list. Labs  

## 2014-04-30 ENCOUNTER — Ambulatory Visit (INDEPENDENT_AMBULATORY_CARE_PROVIDER_SITE_OTHER): Payer: BC Managed Care – PPO | Admitting: Psychology

## 2014-04-30 DIAGNOSIS — F411 Generalized anxiety disorder: Secondary | ICD-10-CM

## 2014-04-30 DIAGNOSIS — F341 Dysthymic disorder: Secondary | ICD-10-CM

## 2014-05-10 LAB — HM DIABETES EYE EXAM

## 2014-05-11 ENCOUNTER — Ambulatory Visit (INDEPENDENT_AMBULATORY_CARE_PROVIDER_SITE_OTHER): Payer: BLUE CROSS/BLUE SHIELD | Admitting: Psychology

## 2014-05-11 DIAGNOSIS — F411 Generalized anxiety disorder: Secondary | ICD-10-CM

## 2014-05-16 ENCOUNTER — Other Ambulatory Visit: Payer: Self-pay | Admitting: Internal Medicine

## 2014-05-17 ENCOUNTER — Ambulatory Visit (INDEPENDENT_AMBULATORY_CARE_PROVIDER_SITE_OTHER): Payer: BLUE CROSS/BLUE SHIELD | Admitting: Psychology

## 2014-05-17 DIAGNOSIS — F341 Dysthymic disorder: Secondary | ICD-10-CM

## 2014-05-17 DIAGNOSIS — F411 Generalized anxiety disorder: Secondary | ICD-10-CM

## 2014-05-21 MED ORDER — LORAZEPAM 1 MG PO TABS: 1.0000 mg | ORAL_TABLET | Freq: Two times a day (BID) | ORAL | Status: DC | PRN

## 2014-05-21 NOTE — Addendum Note (Signed)
Addended by: Lowella Dandy on: 05/21/2014 09:41 AM   Modules accepted: Orders

## 2014-05-22 ENCOUNTER — Encounter: Payer: BC Managed Care – PPO | Admitting: Physician Assistant

## 2014-05-24 ENCOUNTER — Ambulatory Visit (INDEPENDENT_AMBULATORY_CARE_PROVIDER_SITE_OTHER): Payer: BLUE CROSS/BLUE SHIELD | Admitting: Psychology

## 2014-05-24 DIAGNOSIS — F341 Dysthymic disorder: Secondary | ICD-10-CM

## 2014-05-24 DIAGNOSIS — F411 Generalized anxiety disorder: Secondary | ICD-10-CM

## 2014-05-28 ENCOUNTER — Encounter: Payer: Self-pay | Admitting: Internal Medicine

## 2014-06-19 ENCOUNTER — Ambulatory Visit: Payer: BLUE CROSS/BLUE SHIELD | Admitting: Psychology

## 2014-06-29 ENCOUNTER — Ambulatory Visit: Payer: BLUE CROSS/BLUE SHIELD | Admitting: Psychology

## 2014-07-04 ENCOUNTER — Ambulatory Visit (INDEPENDENT_AMBULATORY_CARE_PROVIDER_SITE_OTHER): Payer: BLUE CROSS/BLUE SHIELD | Admitting: Cardiology

## 2014-07-04 DIAGNOSIS — R0600 Dyspnea, unspecified: Secondary | ICD-10-CM

## 2014-07-04 NOTE — Progress Notes (Signed)
Exercise Treadmill Test  Pre-Exercise Testing Evaluation Rhythm: normal sinus  Rate: 62 bpm     Test  Exercise Tolerance Test Ordering MD: Jenkins Rouge, MD  Interpreting MD: Kerin Ransom, PA-C  Unique Test No: 1  Treadmill:  1  Indication for ETT: exertional dyspnea  Contraindication to ETT: No   Stress Modality: exercise - treadmill  Cardiac Imaging Performed: non   Protocol: standard Bruce - maximal  Max BP:  199/96  Max MPHR (bpm):  157 85% MPR (bpm):  133  MPHR obtained (bpm):  160 % MPHR obtained:  >100  Reached 85% MPHR (min:sec):  7:00 Total Exercise Time (min-sec):  10:00  Workload in METS:  11.7 Borg Scale: 17  Reason ETT Terminated:  fatigue    ST Segment Analysis At Rest: normal ST segments - no evidence of significant ST depression With Exercise: no evidence of significant ST depression  Other Information Arrhythmia:  occasional PVCs at rest and during exercise and recovery Angina during ETT:  absent (0) Quality of ETT:  diagnostic  ETT Interpretation:  normal - no evidence of ischemia by ST analysis  Comments: Pt exercised 10 minutes of Bruce, > 100 APMHR, no angina, no ST changes.  Recommendations: Per Dr Johnsie Cancel

## 2014-07-09 ENCOUNTER — Ambulatory Visit (INDEPENDENT_AMBULATORY_CARE_PROVIDER_SITE_OTHER): Payer: BLUE CROSS/BLUE SHIELD | Admitting: Psychology

## 2014-07-09 DIAGNOSIS — F411 Generalized anxiety disorder: Secondary | ICD-10-CM | POA: Diagnosis not present

## 2014-07-09 DIAGNOSIS — F341 Dysthymic disorder: Secondary | ICD-10-CM

## 2014-07-17 ENCOUNTER — Ambulatory Visit (INDEPENDENT_AMBULATORY_CARE_PROVIDER_SITE_OTHER): Payer: BLUE CROSS/BLUE SHIELD | Admitting: Psychology

## 2014-07-17 DIAGNOSIS — F341 Dysthymic disorder: Secondary | ICD-10-CM | POA: Diagnosis not present

## 2014-07-17 DIAGNOSIS — F411 Generalized anxiety disorder: Secondary | ICD-10-CM | POA: Diagnosis not present

## 2014-07-25 ENCOUNTER — Ambulatory Visit: Payer: BLUE CROSS/BLUE SHIELD | Admitting: Psychology

## 2014-07-26 ENCOUNTER — Other Ambulatory Visit: Payer: Self-pay | Admitting: Internal Medicine

## 2014-08-01 ENCOUNTER — Ambulatory Visit (INDEPENDENT_AMBULATORY_CARE_PROVIDER_SITE_OTHER): Payer: BLUE CROSS/BLUE SHIELD | Admitting: Psychology

## 2014-08-01 DIAGNOSIS — F411 Generalized anxiety disorder: Secondary | ICD-10-CM

## 2014-08-01 DIAGNOSIS — F341 Dysthymic disorder: Secondary | ICD-10-CM

## 2014-08-14 ENCOUNTER — Ambulatory Visit (INDEPENDENT_AMBULATORY_CARE_PROVIDER_SITE_OTHER): Payer: BLUE CROSS/BLUE SHIELD | Admitting: Internal Medicine

## 2014-08-14 ENCOUNTER — Encounter: Payer: Self-pay | Admitting: Internal Medicine

## 2014-08-14 VITALS — BP 122/68 | HR 62 | Temp 98.5°F | Resp 16 | Wt 183.0 lb

## 2014-08-14 DIAGNOSIS — I10 Essential (primary) hypertension: Secondary | ICD-10-CM

## 2014-08-14 DIAGNOSIS — E1151 Type 2 diabetes mellitus with diabetic peripheral angiopathy without gangrene: Secondary | ICD-10-CM | POA: Diagnosis not present

## 2014-08-14 DIAGNOSIS — I2510 Atherosclerotic heart disease of native coronary artery without angina pectoris: Secondary | ICD-10-CM | POA: Diagnosis not present

## 2014-08-14 DIAGNOSIS — F411 Generalized anxiety disorder: Secondary | ICD-10-CM

## 2014-08-14 DIAGNOSIS — E785 Hyperlipidemia, unspecified: Secondary | ICD-10-CM

## 2014-08-14 DIAGNOSIS — E1159 Type 2 diabetes mellitus with other circulatory complications: Secondary | ICD-10-CM

## 2014-08-14 MED ORDER — PANTOPRAZOLE SODIUM 40 MG PO TBEC: 40.0000 mg | DELAYED_RELEASE_TABLET | Freq: Two times a day (BID) | ORAL | Status: DC

## 2014-08-14 MED ORDER — VALSARTAN 320 MG PO TABS: 320.0000 mg | ORAL_TABLET | Freq: Every day | ORAL | Status: DC

## 2014-08-14 MED ORDER — TADALAFIL 20 MG PO TABS: ORAL_TABLET | ORAL | Status: DC

## 2014-08-14 MED ORDER — FLUOXETINE HCL 40 MG PO CAPS: 40.0000 mg | ORAL_CAPSULE | Freq: Every day | ORAL | Status: DC

## 2014-08-14 MED ORDER — METFORMIN HCL 1000 MG PO TABS: 1000.0000 mg | ORAL_TABLET | Freq: Two times a day (BID) | ORAL | Status: DC

## 2014-08-14 MED ORDER — AMLODIPINE BESYLATE 2.5 MG PO TABS: 2.5000 mg | ORAL_TABLET | Freq: Every day | ORAL | Status: DC

## 2014-08-14 MED ORDER — PANTOPRAZOLE SODIUM 40 MG PO TBEC: 40.0000 mg | DELAYED_RELEASE_TABLET | Freq: Every day | ORAL | Status: DC

## 2014-08-14 NOTE — Progress Notes (Signed)
Pre visit review using our clinic review tool, if applicable. No additional management support is needed unless otherwise documented below in the visit note. 

## 2014-08-14 NOTE — Progress Notes (Signed)
Subjective:   C/o mole on L leg- resolving F/u dizziness, fatigue, drowsy sometimes - better; beer 1/d - no more   F/u loosing voice when eating, coughing - better on Nexium 20 mg, had a rash on 40 mg F/u being irritable, stressed out. F/u anxiety - seeing Dr Cheryln Manly F/u LBP irrad to R hip --was in PT  F/u hypothyroidism The patient presents for a follow-up of  chronic hypertension, chronic dyslipidemia, type 2 diabetes controlled with medicines F/u pain and swelling in the L ulnar wrist worse w/playing music x months - worse F/u hearing loss, tinnitus - he got a hearing test - decided not to have a hearing aid      Review of Systems  Constitutional: Negative for appetite change, fatigue and unexpected weight change.  HENT: Negative for nosebleeds, congestion, sore throat, sneezing, trouble swallowing and neck pain.   Eyes: Negative for itching and visual disturbance.  Respiratory: Negative for cough.   Cardiovascular: Negative for chest pain, palpitations and leg swelling.  Gastrointestinal: Negative for nausea, diarrhea, blood in stool and abdominal distention.  Genitourinary: Negative for frequency and hematuria.  Musculoskeletal: Positive for arthralgias. Negative for myalgias, back pain, joint swelling and gait problem.  Skin: Negative for rash.  Neurological: Negative for dizziness, tremors, speech difficulty and weakness.  Psychiatric/Behavioral: Negative for sleep disturbance, dysphoric mood and agitation. The patient is not nervous/anxious.    Wt Readings from Last 3 Encounters:  08/14/14 183 lb (83.008 kg)  04/18/14 187 lb (84.823 kg)  01/26/14 180 lb (81.647 kg)   BP Readings from Last 3 Encounters:  08/14/14 122/68  04/18/14 152/82  01/26/14 142/88       BP 122/68 mmHg  Pulse 62  Temp(Src) 98.5 F (36.9 C) (Oral)  Resp 16  Wt 183 lb (83.008 kg)  SpO2 98%     Objective:   Physical Exam  Constitutional: He is oriented to person, place, and  time. He appears well-developed.  HENT:  Mouth/Throat: Oropharynx is clear and moist.  Eyes: Conjunctivae normal are normal. Pupils are equal, round, and reactive to light.  Neck: Normal range of motion. No JVD present. No thyromegaly present.  Cardiovascular: Normal rate, regular rhythm, normal heart sounds and intact distal pulses.  Exam reveals no gallop and no friction rub.   No murmur heard. Pulmonary/Chest: Effort normal and breath sounds normal. No respiratory distress. He has no wheezes. He has no rales. He exhibits no tenderness.  Abdominal: Soft. Bowel sounds are normal. He exhibits no distension and no mass. There is no tenderness. There is no rebound and no guarding.  Musculoskeletal: Normal range of motion. He exhibits no edema and no tenderness.  Lymphadenopathy:    He has no cervical adenopathy.  Neurological: He is alert and oriented to person, place, and time. He has normal reflexes. No cranial nerve deficit. He exhibits normal muscle tone. Coordination normal.  Skin: Skin is warm and dry. No rash noted.  Psychiatric: He has a normal mood and affect. His behavior is normal. Judgment and thought content normal.  11x7 mm L shin pigmented lesion  Lab Results  Component Value Date   WBC 9.3 05/21/2011   HGB 15.1 05/21/2011   HCT 44.0 05/21/2011   PLT 292.0 05/21/2011   GLUCOSE 94 12/15/2011   CHOL 233* 12/15/2011   TRIG 122.0 12/15/2011   HDL 68.70 12/15/2011   LDLDIRECT 149.6 12/15/2011   LDLCALC 119* 06/27/2010   ALT 18 12/15/2011   AST 18 12/15/2011  NA 139 12/15/2011   K 4.4 12/15/2011   CL 103 12/15/2011   CREATININE 0.8 12/15/2011   BUN 12 12/15/2011   CO2 28 12/15/2011   TSH 0.97 12/15/2011   PSA 0.29 12/02/2009   INR 0.98 06/03/2010   HGBA1C 6.6* 12/15/2011      Assessment & Plan:

## 2014-08-16 ENCOUNTER — Encounter: Payer: Self-pay | Admitting: Internal Medicine

## 2014-08-16 NOTE — Assessment & Plan Note (Signed)
Chronic Dr Cheryln Manly  Fluoxetine, Lorazepam

## 2014-08-16 NOTE — Assessment & Plan Note (Signed)
Chronic  On Crestor 

## 2014-08-16 NOTE — Assessment & Plan Note (Signed)
Chronic On Metformin 

## 2014-08-16 NOTE — Assessment & Plan Note (Signed)
Chronic  Fluoxetine, Lorazepam

## 2014-08-16 NOTE — Assessment & Plan Note (Signed)
Chronic  Amlodipine, Valsartan

## 2014-08-16 NOTE — Assessment & Plan Note (Signed)
Chronic ASA, Crestor

## 2014-08-24 ENCOUNTER — Other Ambulatory Visit (INDEPENDENT_AMBULATORY_CARE_PROVIDER_SITE_OTHER): Payer: BLUE CROSS/BLUE SHIELD

## 2014-08-24 ENCOUNTER — Ambulatory Visit (INDEPENDENT_AMBULATORY_CARE_PROVIDER_SITE_OTHER): Payer: BLUE CROSS/BLUE SHIELD | Admitting: Psychology

## 2014-08-24 DIAGNOSIS — I10 Essential (primary) hypertension: Secondary | ICD-10-CM | POA: Diagnosis not present

## 2014-08-24 DIAGNOSIS — F411 Generalized anxiety disorder: Secondary | ICD-10-CM | POA: Diagnosis not present

## 2014-08-24 DIAGNOSIS — E1151 Type 2 diabetes mellitus with diabetic peripheral angiopathy without gangrene: Secondary | ICD-10-CM

## 2014-08-24 DIAGNOSIS — F341 Dysthymic disorder: Secondary | ICD-10-CM | POA: Diagnosis not present

## 2014-08-24 DIAGNOSIS — I2510 Atherosclerotic heart disease of native coronary artery without angina pectoris: Secondary | ICD-10-CM

## 2014-08-24 DIAGNOSIS — E1159 Type 2 diabetes mellitus with other circulatory complications: Secondary | ICD-10-CM

## 2014-08-24 DIAGNOSIS — E785 Hyperlipidemia, unspecified: Secondary | ICD-10-CM

## 2014-08-24 LAB — CBC WITH DIFFERENTIAL/PLATELET
BASOS PCT: 0.4 % (ref 0.0–3.0)
Basophils Absolute: 0 10*3/uL (ref 0.0–0.1)
EOS ABS: 0.1 10*3/uL (ref 0.0–0.7)
Eosinophils Relative: 1.3 % (ref 0.0–5.0)
HCT: 41.4 % (ref 39.0–52.0)
HEMOGLOBIN: 14.1 g/dL (ref 13.0–17.0)
LYMPHS PCT: 19.7 % (ref 12.0–46.0)
Lymphs Abs: 1 10*3/uL (ref 0.7–4.0)
MCHC: 34.1 g/dL (ref 30.0–36.0)
MCV: 92.6 fl (ref 78.0–100.0)
MONOS PCT: 10 % (ref 3.0–12.0)
Monocytes Absolute: 0.5 10*3/uL (ref 0.1–1.0)
NEUTROS ABS: 3.6 10*3/uL (ref 1.4–7.7)
Neutrophils Relative %: 68.6 % (ref 43.0–77.0)
Platelets: 233 10*3/uL (ref 150.0–400.0)
RBC: 4.47 Mil/uL (ref 4.22–5.81)
RDW: 13.6 % (ref 11.5–15.5)
WBC: 5.3 10*3/uL (ref 4.0–10.5)

## 2014-08-24 LAB — BASIC METABOLIC PANEL
BUN: 14 mg/dL (ref 6–23)
CO2: 31 mEq/L (ref 19–32)
Calcium: 9.9 mg/dL (ref 8.4–10.5)
Chloride: 101 mEq/L (ref 96–112)
Creatinine, Ser: 0.83 mg/dL (ref 0.40–1.50)
GFR: 99.19 mL/min (ref >60.00)
Glucose, Bld: 148 mg/dL — ABNORMAL HIGH (ref 70–99)
POTASSIUM: 4.3 meq/L (ref 3.5–5.1)
Sodium: 139 mEq/L (ref 135–145)

## 2014-08-24 LAB — PSA: PSA: 0.4 ng/mL (ref 0.10–4.00)

## 2014-08-24 LAB — HEPATIC FUNCTION PANEL
ALT: 17 U/L (ref 0–53)
AST: 17 U/L (ref 0–37)
Albumin: 4.6 g/dL (ref 3.5–5.2)
Alkaline Phosphatase: 90 U/L (ref 39–117)
Bilirubin, Direct: 0.2 mg/dL (ref 0.0–0.3)
TOTAL PROTEIN: 6.9 g/dL (ref 6.0–8.3)
Total Bilirubin: 0.7 mg/dL (ref 0.2–1.2)

## 2014-08-24 LAB — LIPID PANEL
Cholesterol: 150 mg/dL (ref 0–200)
HDL: 64 mg/dL (ref >39.00)
LDL Cholesterol: 77 mg/dL (ref 0–99)
NonHDL: 86
TRIGLYCERIDES: 45 mg/dL (ref 0.0–149.0)
Total CHOL/HDL Ratio: 2
VLDL: 9 mg/dL (ref 0.0–40.0)

## 2014-08-24 LAB — TSH: TSH: 1.31 u[IU]/mL (ref 0.35–4.50)

## 2014-08-24 LAB — HEMOGLOBIN A1C: HEMOGLOBIN A1C: 6.7 % — AB (ref 4.6–6.5)

## 2014-08-24 LAB — VITAMIN B12: VITAMIN B 12: 307 pg/mL (ref 211–911)

## 2014-08-27 ENCOUNTER — Encounter: Payer: Self-pay | Admitting: Internal Medicine

## 2014-08-31 ENCOUNTER — Ambulatory Visit (INDEPENDENT_AMBULATORY_CARE_PROVIDER_SITE_OTHER): Payer: BLUE CROSS/BLUE SHIELD | Admitting: Psychology

## 2014-08-31 DIAGNOSIS — F341 Dysthymic disorder: Secondary | ICD-10-CM

## 2014-08-31 DIAGNOSIS — F411 Generalized anxiety disorder: Secondary | ICD-10-CM | POA: Diagnosis not present

## 2014-09-05 ENCOUNTER — Ambulatory Visit (INDEPENDENT_AMBULATORY_CARE_PROVIDER_SITE_OTHER): Payer: BLUE CROSS/BLUE SHIELD | Admitting: Psychology

## 2014-09-05 DIAGNOSIS — F411 Generalized anxiety disorder: Secondary | ICD-10-CM | POA: Diagnosis not present

## 2014-09-05 DIAGNOSIS — F341 Dysthymic disorder: Secondary | ICD-10-CM | POA: Diagnosis not present

## 2014-09-17 ENCOUNTER — Ambulatory Visit (INDEPENDENT_AMBULATORY_CARE_PROVIDER_SITE_OTHER): Payer: BLUE CROSS/BLUE SHIELD | Admitting: Psychology

## 2014-09-17 DIAGNOSIS — F341 Dysthymic disorder: Secondary | ICD-10-CM | POA: Diagnosis not present

## 2014-09-17 DIAGNOSIS — F411 Generalized anxiety disorder: Secondary | ICD-10-CM | POA: Diagnosis not present

## 2014-09-25 ENCOUNTER — Ambulatory Visit (INDEPENDENT_AMBULATORY_CARE_PROVIDER_SITE_OTHER): Payer: BLUE CROSS/BLUE SHIELD | Admitting: Psychology

## 2014-09-25 DIAGNOSIS — F341 Dysthymic disorder: Secondary | ICD-10-CM | POA: Diagnosis not present

## 2014-09-25 DIAGNOSIS — F411 Generalized anxiety disorder: Secondary | ICD-10-CM | POA: Diagnosis not present

## 2014-10-04 ENCOUNTER — Encounter: Payer: Self-pay | Admitting: Internal Medicine

## 2014-10-09 ENCOUNTER — Ambulatory Visit (INDEPENDENT_AMBULATORY_CARE_PROVIDER_SITE_OTHER): Payer: BLUE CROSS/BLUE SHIELD | Admitting: Psychology

## 2014-10-09 DIAGNOSIS — F411 Generalized anxiety disorder: Secondary | ICD-10-CM | POA: Diagnosis not present

## 2014-10-09 DIAGNOSIS — F341 Dysthymic disorder: Secondary | ICD-10-CM | POA: Diagnosis not present

## 2014-10-16 ENCOUNTER — Ambulatory Visit (INDEPENDENT_AMBULATORY_CARE_PROVIDER_SITE_OTHER): Payer: BLUE CROSS/BLUE SHIELD | Admitting: Psychology

## 2014-10-16 DIAGNOSIS — F341 Dysthymic disorder: Secondary | ICD-10-CM | POA: Diagnosis not present

## 2014-10-16 DIAGNOSIS — F411 Generalized anxiety disorder: Secondary | ICD-10-CM | POA: Diagnosis not present

## 2014-10-29 ENCOUNTER — Other Ambulatory Visit: Payer: Self-pay

## 2014-11-06 ENCOUNTER — Encounter: Payer: Self-pay | Admitting: Family

## 2014-11-06 ENCOUNTER — Ambulatory Visit (INDEPENDENT_AMBULATORY_CARE_PROVIDER_SITE_OTHER): Payer: BLUE CROSS/BLUE SHIELD | Admitting: Family

## 2014-11-06 VITALS — BP 130/90 | HR 65 | Temp 98.1°F | Resp 18 | Ht 67.0 in | Wt 176.0 lb

## 2014-11-06 DIAGNOSIS — R059 Cough, unspecified: Secondary | ICD-10-CM | POA: Insufficient documentation

## 2014-11-06 DIAGNOSIS — R05 Cough: Secondary | ICD-10-CM | POA: Insufficient documentation

## 2014-11-06 MED ORDER — LEVOFLOXACIN 500 MG PO TABS: 500.0000 mg | ORAL_TABLET | Freq: Every day | ORAL | Status: DC

## 2014-11-06 NOTE — Progress Notes (Signed)
Pre visit review using our clinic review tool, if applicable. No additional management support is needed unless otherwise documented below in the visit note. 

## 2014-11-06 NOTE — Patient Instructions (Addendum)
Thank you for choosing [facility].  Summary/Instructions:  Your prescription(s) have been submitted to your pharmacy or been printed and provided for you. Please take as directed and contact our office if you believe you are having problem(s) with the medication(s) or have any questions.  If your symptoms worsen or fail to improve, please contact our office for further instruction, or in case of emergency go directly to the emergency room at the closest medical facility.   General Recommendations:    Please drink plenty of fluids.  Get plenty of rest   Sleep in humidified air  Use saline nasal sprays  Netti pot   OTC Medications:  Decongestants - helps relieve congestion   Flonase (generic fluticasone) or Nasacort (generic triamcinolone) - please make sure to use the "cross-over" technique at a 45 degree angle towards the opposite eye as opposed to straight up the nasal passageway.   If you have HIGH BLOOD PRESSURE - Coricidin HBP; AVOID any product that is -D as this contains pseudoephedrine which may increase your blood pressure.  Afrin (oxymetazoline) every 6-8 hours for up to 3 days.   Allergies - helps relieve runny nose, itchy eyes and sneezing   Claritin (generic loratidine), Allegra (fexofenidine), or Zyrtec (generic cyrterizine) for runny nose. These medications should not cause drowsiness.  Note - Benadryl (generic diphenhydramine) may be used however may cause drowsiness  Cough -   Delsym or Robitussin (generic dextromethorphan)  Expectorants - helps loosen mucus to ease removal   Mucinex (generic guaifenesin) as directed on the package.  Headaches / General Aches   Tylenol (generic acetaminophen) - DO NOT EXCEED 3 grams (3,000 mg) in a 24 hour time period  Advil/Motrin (generic ibuprofen)   Sore Throat -   Salt water gargle   Chloraseptic (generic benzocaine) spray or lozenges / Sucrets (generic dyclonine)      

## 2014-11-06 NOTE — Progress Notes (Signed)
Subjective:    Patient ID: Jared Alvarez, male    DOB: 1951/01/29, 64 y.o.   MRN: [id number]  Chief Complaint  Patient presents with  . Cough    x2 weeks, has had a cold with a severe cough, body aches, SOB, states that he has coughed so much that he feels like he has developed a hernia, he now has a bruise on his lower abdomen,    HPI:  Jared Alvarez is a 64 y.o. male with a PMH of anxiety, bronchitis, constipation, depression, type 2 diabetes, dyslipidemia, fatigue, hypertension, and erectile dysfunction who presents today for an acute office visit.   This is a new problem. Associated symptoms of severe cough, body aches and shortness of breath have been going on for about 2 weeks. Notes the severity of the cough that he feels like he could have developed a hernia. Modifying factors include Mucinex and benedryl. Timing of symptoms are fairly constant throughout the day. Also indicates that he feels "fuzzy." Describes his ears feeling like they are closed with tinnitus. Denies recent antibiotic use.    Allergies  Allergen Reactions  . Atorvastatin     REACTION: pains  . Bupropion Hcl     REACTION: side effects  . Duloxetine     REACTION: dizzy  . Fluoxetine Hcl     REACTION: withdrawl  . Nexium [Esomeprazole Magnesium]     Rash on legs  . Simvastatin     REACTION: pain  . Venlafaxine     REACTION: side effects  . Viibryd [Vilazodone Hcl]     nightmares    Current Outpatient Prescriptions on File Prior to Visit  Medication Sig Dispense Refill  . amLODipine (NORVASC) 2.5 MG tablet Take 1 tablet (2.5 mg total) by mouth daily. 90 tablet 3  . Ascorbic Acid (VITAMIN C) 1000 MG tablet Take 1,000 mg by mouth daily.      [name] aspirin 81 MG tablet Take 81 mg by mouth daily.      . colchicine 0.6 MG tablet Take 1 tablet (0.6 mg total) by mouth as needed. If patient has flare up with gout 30 tablet 5  . FLUoxetine (PROZAC) 40 MG capsule Take 1 capsule (40 mg total) by mouth  daily. 90 capsule 1  . Linaclotide (LINZESS) 290 MCG CAPS capsule Take 1 capsule by mouth as needed.    [name] LORazepam (ATIVAN) 1 MG tablet Take 1 tablet (1 mg total) by mouth 2 (two) times daily as needed. for anxiety 180 tablet 1  . metFORMIN (GLUCOPHAGE) 1000 MG tablet Take 1 tablet (1,000 mg total) by mouth 2 (two) times daily with a meal. 180 tablet 3  . pantoprazole (PROTONIX) 40 MG tablet Take 1 tablet (40 mg total) by mouth 2 (two) times daily before a meal. 180 tablet 3  . rosuvastatin (CRESTOR) 40 MG tablet TAKE ONE TABLET prn    . tadalafil (CIALIS) 20 MG tablet TAKE 1 TABLET ONCE DAILY AS NEEDED FOR ERECTILE DYSFUNCTION. 4 tablet 5  . traMADol (ULTRAM) 50 MG tablet TAKE ONE TO TWO TABLETS BY MOUTH TWICE DAILY AS NEEDED FOR PAIN. 60 tablet 2  . valsartan (DIOVAN) 320 MG tablet Take 1 tablet (320 mg total) by mouth daily. 90 tablet 3   No current facility-administered medications on file prior to visit.    Review of Systems  Constitutional: Negative for fever and chills.  HENT: Positive for congestion, sinus pressure and tinnitus. Negative for sore throat.   Respiratory: Positive  for cough, chest tightness and shortness of breath.   Neurological: Negative for headaches.      Objective:    BP 130/90 mmHg  Pulse 65  Temp(Src) 98.1 F (36.7 C) (Oral)  Resp 18  Ht 5\' 7"  (1.702 m)  Wt 176 lb (79.833 kg)  BMI 27.56 kg/m2  SpO2 95% Nursing note and vital signs reviewed.  Physical Exam  Constitutional: He is oriented to person, place, and time. He appears well-developed and well-nourished. No distress.  HENT:  Right Ear: Hearing, tympanic membrane, external ear and ear canal normal.  Left Ear: Hearing, tympanic membrane, external ear and ear canal normal.  Nose: Right sinus exhibits maxillary sinus tenderness. Left sinus exhibits maxillary sinus tenderness.  Mouth/Throat: Uvula is midline, oropharynx is clear and moist and mucous membranes are normal.  Neck: Neck supple.    Cardiovascular: Normal rate, regular rhythm, normal heart sounds and intact distal pulses.   Pulmonary/Chest: Effort normal and breath sounds normal.  Lymphadenopathy:    He has no cervical adenopathy.  Neurological: He is alert and oriented to person, place, and time.  Skin: Skin is warm and dry.  Psychiatric: He has a normal mood and affect. His behavior is normal. Judgment and thought content normal.       Assessment & Plan:   Problem List Items Addressed This Visit      Other   Cough - Primary    Symptoms and exam consistent with bacterial upper respiratory infection, however cannot rule out acute bronchitis. Start Levaquin. Continue over-the-counter medications as needed for symptom relief and supportive care. Follow-up if symptoms worsen or fail to improve.      Relevant Medications   levofloxacin (LEVAQUIN) 500 MG tablet

## 2014-11-06 NOTE — Assessment & Plan Note (Signed)
Symptoms and exam consistent with bacterial upper respiratory infection, however cannot rule out acute bronchitis. Start Levaquin. Continue over-the-counter medications as needed for symptom relief and supportive care. Follow-up if symptoms worsen or fail to improve.

## 2014-11-07 ENCOUNTER — Ambulatory Visit: Payer: Self-pay | Admitting: Internal Medicine

## 2014-11-26 ENCOUNTER — Other Ambulatory Visit: Payer: Self-pay | Admitting: *Deleted

## 2014-11-26 MED ORDER — ROSUVASTATIN CALCIUM 40 MG PO TABS: 40.0000 mg | ORAL_TABLET | Freq: Every day | ORAL | Status: DC

## 2014-11-26 NOTE — Telephone Encounter (Signed)
Rec fax requesting new Rxs for Crestor and Lorazepam.  Crestor sent. See meds.  Please advise on Lorazepam .   See pended med. Fax Rx to [facility] @ [phone].

## 2014-11-29 ENCOUNTER — Telehealth: Payer: Self-pay

## 2014-11-29 MED ORDER — LORAZEPAM 1 MG PO TABS: 1.0000 mg | ORAL_TABLET | Freq: Two times a day (BID) | ORAL | Status: DC | PRN

## 2014-11-29 NOTE — Telephone Encounter (Signed)
Sent to plan via covermymeds. KEY: [id number] Script: Cialis

## 2014-12-05 NOTE — Telephone Encounter (Signed)
Cialis PA is denied. Viagra must be tried, before coverage will be considered.

## 2014-12-21 ENCOUNTER — Ambulatory Visit: Payer: Self-pay | Admitting: Internal Medicine

## 2014-12-25 ENCOUNTER — Ambulatory Visit (INDEPENDENT_AMBULATORY_CARE_PROVIDER_SITE_OTHER): Payer: BLUE CROSS/BLUE SHIELD | Admitting: Internal Medicine

## 2014-12-25 ENCOUNTER — Encounter: Payer: Self-pay | Admitting: Internal Medicine

## 2014-12-25 ENCOUNTER — Other Ambulatory Visit (INDEPENDENT_AMBULATORY_CARE_PROVIDER_SITE_OTHER): Payer: BLUE CROSS/BLUE SHIELD

## 2014-12-25 VITALS — BP 138/90 | HR 58 | Ht 68.0 in | Wt 175.0 lb

## 2014-12-25 DIAGNOSIS — E1151 Type 2 diabetes mellitus with diabetic peripheral angiopathy without gangrene: Secondary | ICD-10-CM | POA: Diagnosis not present

## 2014-12-25 DIAGNOSIS — F329 Major depressive disorder, single episode, unspecified: Secondary | ICD-10-CM | POA: Diagnosis not present

## 2014-12-25 DIAGNOSIS — E1159 Type 2 diabetes mellitus with other circulatory complications: Secondary | ICD-10-CM

## 2014-12-25 DIAGNOSIS — E538 Deficiency of other specified B group vitamins: Secondary | ICD-10-CM

## 2014-12-25 DIAGNOSIS — I10 Essential (primary) hypertension: Secondary | ICD-10-CM

## 2014-12-25 DIAGNOSIS — F32A Depression, unspecified: Secondary | ICD-10-CM

## 2014-12-25 LAB — VITAMIN B12: Vitamin B-12: 229 pg/mL (ref 211–911)

## 2014-12-25 LAB — HEPATIC FUNCTION PANEL
ALT: 15 U/L (ref 0–53)
AST: 17 U/L (ref 0–37)
Albumin: 4.5 g/dL (ref 3.5–5.2)
Alkaline Phosphatase: 73 U/L (ref 39–117)
Bilirubin, Direct: 0.2 mg/dL (ref 0.0–0.3)
Total Bilirubin: 0.7 mg/dL (ref 0.2–1.2)
Total Protein: 6.9 g/dL (ref 6.0–8.3)

## 2014-12-25 LAB — BASIC METABOLIC PANEL
BUN: 13 mg/dL (ref 6–23)
CO2: 30 mEq/L (ref 19–32)
Calcium: 9.9 mg/dL (ref 8.4–10.5)
Chloride: 102 mEq/L (ref 96–112)
Creatinine, Ser: 0.79 mg/dL (ref 0.40–1.50)
GFR: 104.89 mL/min (ref >60.00)
Glucose, Bld: 123 mg/dL — ABNORMAL HIGH (ref 70–99)
Potassium: 4.6 mEq/L (ref 3.5–5.1)
Sodium: 140 mEq/L (ref 135–145)

## 2014-12-25 LAB — HEMOGLOBIN A1C: HEMOGLOBIN A1C: 6.4 % (ref 4.6–6.5)

## 2014-12-25 LAB — TSH: TSH: 1.17 u[IU]/mL (ref 0.35–4.50)

## 2014-12-25 MED ORDER — VITAMIN B-12 1000 MCG SL SUBL: 1.0000 | SUBLINGUAL_TABLET | Freq: Every day | SUBLINGUAL | Status: DC

## 2014-12-25 NOTE — Progress Notes (Signed)
Subjective:  Patient ID: Jared Alvarez, male    DOB: 05/04/1951  Age: 64 y.o. MRN: [id number]  CC: No chief complaint on file.   HPI Jared Alvarez presents for  dizziness, fatigue f/u - better; beer 1/d - no more. Pt lost wt; cut back on alcohol a lot   F/u loosing voice when eating, coughing - better - lost wt F/u being irritable, stressed out. F/u anxiety - seeing Dr Cheryln Manly F/u LBP irrad to R hip - ok  F/u hypothyroidism  The patient presents for a follow-up of chronic hypertension, chronic dyslipidemia, type 2 diabetes controlled with medicines   Outpatient Prescriptions Prior to Visit  Medication Sig Dispense Refill  . amLODipine (NORVASC) 2.5 MG tablet Take 1 tablet (2.5 mg total) by mouth daily. 90 tablet 3  . Ascorbic Acid (VITAMIN C) 1000 MG tablet Take 1,000 mg by mouth daily.      [name] aspirin 81 MG tablet Take 81 mg by mouth daily.      . colchicine 0.6 MG tablet Take 1 tablet (0.6 mg total) by mouth as needed. If patient has flare up with gout 30 tablet 5  . FLUoxetine (PROZAC) 40 MG capsule Take 1 capsule (40 mg total) by mouth daily. 90 capsule 1  . levofloxacin (LEVAQUIN) 500 MG tablet Take 1 tablet (500 mg total) by mouth daily. 7 tablet 0  . Linaclotide (LINZESS) 290 MCG CAPS capsule Take 1 capsule by mouth as needed.    [name] LORazepam (ATIVAN) 1 MG tablet Take 1 tablet (1 mg total) by mouth 2 (two) times daily as needed. for anxiety 180 tablet 5  . metFORMIN (GLUCOPHAGE) 1000 MG tablet Take 1 tablet (1,000 mg total) by mouth 2 (two) times daily with a meal. 180 tablet 3  . pantoprazole (PROTONIX) 40 MG tablet Take 1 tablet (40 mg total) by mouth 2 (two) times daily before a meal. 180 tablet 3  . rosuvastatin (CRESTOR) 40 MG tablet Take 1 tablet (40 mg total) by mouth daily. 90 tablet 3  . tadalafil (CIALIS) 20 MG tablet TAKE 1 TABLET ONCE DAILY AS NEEDED FOR ERECTILE DYSFUNCTION. 4 tablet 5  . traMADol (ULTRAM) 50 MG tablet TAKE ONE TO TWO TABLETS BY MOUTH  TWICE DAILY AS NEEDED FOR PAIN. 60 tablet 2  . valsartan (DIOVAN) 320 MG tablet Take 1 tablet (320 mg total) by mouth daily. 90 tablet 3   No facility-administered medications prior to visit.    ROS Review of Systems  Constitutional: Positive for unexpected weight change. Negative for appetite change and fatigue.  HENT: Negative for congestion, nosebleeds, sneezing, sore throat and trouble swallowing.   Eyes: Negative for itching and visual disturbance.  Respiratory: Negative for cough.   Cardiovascular: Negative for chest pain, palpitations and leg swelling.  Gastrointestinal: Negative for nausea, diarrhea, blood in stool and abdominal distention.  Genitourinary: Negative for frequency and hematuria.  Musculoskeletal: Positive for gait problem. Negative for back pain, joint swelling and neck pain.  Skin: Negative for rash.  Neurological: Negative for dizziness, tremors, speech difficulty and weakness.  Psychiatric/Behavioral: Positive for behavioral problems, sleep disturbance and decreased concentration. Negative for suicidal ideas, dysphoric mood and agitation. The patient is nervous/anxious.     Objective:  BP 138/90 mmHg  Pulse 58  Ht 5\' 8"  (1.727 m)  Wt 175 lb (79.379 kg)  BMI 26.61 kg/m2  SpO2 98%  BP Readings from Last 3 Encounters:  12/25/14 138/90  11/06/14 130/90  08/14/14 122/68  Wt Readings from Last 3 Encounters:  12/25/14 175 lb (79.379 kg)  11/06/14 176 lb (79.833 kg)  08/14/14 183 lb (83.008 kg)    Physical Exam  Constitutional: He is oriented to person, place, and time. He appears well-developed. No distress.  NAD  HENT:  Mouth/Throat: Oropharynx is clear and moist.  Eyes: Conjunctivae are normal. Pupils are equal, round, and reactive to light.  Neck: Normal range of motion. No JVD present. No thyromegaly present.  Cardiovascular: Normal rate, regular rhythm, normal heart sounds and intact distal pulses.  Exam reveals no gallop and no friction rub.    No murmur heard. Pulmonary/Chest: Effort normal and breath sounds normal. No respiratory distress. He has no wheezes. He has no rales. He exhibits no tenderness.  Abdominal: Soft. Bowel sounds are normal. He exhibits no distension and no mass. There is no tenderness. There is no rebound and no guarding.  Musculoskeletal: Normal range of motion. He exhibits no edema or tenderness.  Lymphadenopathy:    He has no cervical adenopathy.  Neurological: He is alert and oriented to person, place, and time. He has normal reflexes. No cranial nerve deficit. He exhibits normal muscle tone. He displays a negative Romberg sign. Coordination abnormal. Gait normal.  Skin: Skin is warm and dry. No rash noted.  Psychiatric: He has a normal mood and affect. His behavior is normal. Judgment and thought content normal.  R shoulder is tender  Lab Results  Component Value Date   WBC 5.3 08/24/2014   HGB 14.1 08/24/2014   HCT 41.4 08/24/2014   PLT 233.0 08/24/2014   GLUCOSE 148* 08/24/2014   CHOL 150 08/24/2014   TRIG 45.0 08/24/2014   HDL 64.00 08/24/2014   LDLDIRECT 149.6 12/15/2011   LDLCALC 77 08/24/2014   ALT 17 08/24/2014   AST 17 08/24/2014   NA 139 08/24/2014   K 4.3 08/24/2014   CL 101 08/24/2014   CREATININE 0.83 08/24/2014   BUN 14 08/24/2014   CO2 31 08/24/2014   TSH 1.31 08/24/2014   PSA 0.40 08/24/2014   INR 0.98 06/03/2010   HGBA1C 6.7* 08/24/2014    No results found.  Assessment & Plan:   There are no diagnoses linked to this encounter. I am having Mr. Uy maintain his aspirin, vitamin C, colchicine, traMADol, Linaclotide, amLODipine, tadalafil, FLUoxetine, metFORMIN, valsartan, pantoprazole, levofloxacin, rosuvastatin, and LORazepam.  No orders of the defined types were placed in this encounter.     Follow-up: No Follow-up on file.  Walker Kehr, MD

## 2014-12-25 NOTE — Progress Notes (Signed)
Pre visit review using our clinic review tool, if applicable. No additional management support is needed unless otherwise documented below in the visit note. 

## 2014-12-25 NOTE — Assessment & Plan Note (Signed)
We may need to cut back on Valsartan

## 2014-12-25 NOTE — Patient Instructions (Signed)
We may need to cut back on Valsartan and Metformin

## 2014-12-25 NOTE — Assessment & Plan Note (Signed)
On B12 

## 2014-12-25 NOTE — Assessment & Plan Note (Signed)
We may need to cut back on Valsartan and Metformin Labs

## 2014-12-25 NOTE — Assessment & Plan Note (Signed)
On Fluoxetine  

## 2015-01-21 ENCOUNTER — Encounter: Payer: Self-pay | Admitting: *Deleted

## 2015-01-23 ENCOUNTER — Ambulatory Visit (INDEPENDENT_AMBULATORY_CARE_PROVIDER_SITE_OTHER): Payer: BLUE CROSS/BLUE SHIELD | Admitting: Psychology

## 2015-01-23 DIAGNOSIS — F341 Dysthymic disorder: Secondary | ICD-10-CM

## 2015-01-23 DIAGNOSIS — F411 Generalized anxiety disorder: Secondary | ICD-10-CM

## 2015-01-24 ENCOUNTER — Ambulatory Visit (INDEPENDENT_AMBULATORY_CARE_PROVIDER_SITE_OTHER): Payer: BLUE CROSS/BLUE SHIELD | Admitting: Cardiovascular Disease

## 2015-01-24 ENCOUNTER — Encounter: Payer: Self-pay | Admitting: Cardiovascular Disease

## 2015-01-24 VITALS — BP 140/70 | HR 53 | Ht 68.0 in | Wt 176.0 lb

## 2015-01-24 DIAGNOSIS — I2510 Atherosclerotic heart disease of native coronary artery without angina pectoris: Secondary | ICD-10-CM | POA: Diagnosis not present

## 2015-01-24 DIAGNOSIS — I2583 Coronary atherosclerosis due to lipid rich plaque: Principal | ICD-10-CM

## 2015-01-24 NOTE — Progress Notes (Signed)
Patient ID: Jared Alvarez, male   DOB: 07-17-1950, 64 y.o.   MRN: [id number] Jared Alvarez is a 64 y.o. . male presents for f/u of CAD , s/p stent to the LAD in 1997 in [place]. Also has DM2, HTN, HLP Gout, depression, anxiety, hypogonadism. He established with me in 3/12. Saw PA October 2012 with chest pain and had benign myovue and echo Some stress running [employer] but otherwise doing well Taking crestor 3-4 x/week and wonders if it is affecting his memory.   Last myovue : 02/12/11 reviewed Normal no ishcemia EF 56%   Echo: 02/12/11 reviewed  Study Conclusions  - Left ventricle: The cavity size was normal. Wall thickness was increased in a pattern of mild LVH. Systolic function was normal. The estimated ejection fraction was in the range of 55% to 60%. Wall motion was normal; there were no regional wall motion abnormalities. - Aortic valve: Trivial regurgitation. - Left atrium: The atrium was mildly dilated. - Pulmonary arteries: Systolic pressure was mildly increased. PA peak pressure: 91mm Hg (S).  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  All other systems reviewed and negative  General: Affect appropriate Healthy:  appears stated age 64: normal Neck supple with no adenopathy JVP normal no bruits no thyromegaly Lungs clear with no wheezing and good diaphragmatic motion Heart:  S1/S2 no murmur, no rub, gallop or click PMI normal Abdomen: benighn, BS positve, no tenderness, no AAA no bruit.  No HSM or HJR Distal pulses intact  with no bruits No edema Neuro non-focal Skin warm and dry No muscular weakness   Current Outpatient Prescriptions  Medication Sig Dispense Refill  . amLODipine (NORVASC) 2.5 MG tablet Take 1 tablet (2.5 mg total) by mouth daily. 90 tablet 3  . Ascorbic Acid (VITAMIN C) 1000 MG tablet Take 1,000 mg by mouth daily.      [name] aspirin 81 MG tablet Take 81 mg by mouth daily.      . colchicine 0.6 MG tablet Take 1 tablet (0.6 mg total) by mouth as needed. If patient has flare up with gout 30 tablet 5  . Cyanocobalamin (VITAMIN B-12) 500 MCG SUBL Place 1 tablet under the tongue daily.    [name] FLUoxetine (PROZAC) 40 MG capsule Take 1 capsule (40 mg total) by mouth daily. 90 capsule 1  . Linaclotide (LINZESS) 290 MCG CAPS capsule Take 1 capsule by mouth daily as needed (consipation/IBS).     . LORazepam (ATIVAN) 1 MG tablet Take 1 mg by mouth 2 (two) times daily as needed for anxiety.    . metFORMIN (GLUCOPHAGE) 1000 MG tablet Take 1 tablet (1,000 mg total) by mouth 2 (two) times daily with a meal. 180 tablet 3  . pantoprazole (PROTONIX) 40 MG tablet Take 1 tablet (40 mg total) by mouth 2 (two) times daily before a meal. 180 tablet 3  . rosuvastatin (CRESTOR) 40 MG tablet Take 1 tablet (40 mg total) by mouth daily. 90 tablet 3  . tadalafil (CIALIS) 20 MG tablet TAKE 1 TABLET ONCE DAILY AS NEEDED FOR ERECTILE DYSFUNCTION.  4 tablet 5  . traMADol (ULTRAM) 50 MG tablet TAKE ONE TO TWO TABLETS BY MOUTH TWICE DAILY AS NEEDED FOR PAIN. 60 tablet 2  . valsartan (DIOVAN) 320 MG tablet Take 1 tablet (320 mg total) by mouth daily. 90 tablet 3   No current facility-administered medications for this visit.    Allergies  Atorvastatin; Bupropion hcl; Duloxetine; Fluoxetine hcl; Nexium; Simvastatin; Venlafaxine; and Viibryd  Electrocardiogram:  10/25/12  SR rate 50 normal  11/2013  NSR rate 60 normal  01/24/15  SR rate 53  Nonspecific ST changes   Assessment and Plan CAD:  Distant LAD intervention Normal myovue  2012 Chol:  At goal continue current crestor dosing HTN:  Well controlled.  Continue current medications and low sodium Dash type diet.    F/U with me in a year

## 2015-01-24 NOTE — Patient Instructions (Signed)
Medication Instructions:  Your physician recommends that you continue on your current medications as directed. Please refer to the Current Medication list given to you today.   Labwork: None ordered  Testing/Procedures: None ordered  Follow-Up: Your physician wants you to follow-up in: 12 months with Dr Blima Singer will receive a reminder letter in the mail two months in advance. If you don't receive a letter, please call our office to schedule the follow-up appointment.   Any Other Special Instructions Will Be Listed Below (If Applicable).

## 2015-01-30 ENCOUNTER — Ambulatory Visit (INDEPENDENT_AMBULATORY_CARE_PROVIDER_SITE_OTHER): Payer: BLUE CROSS/BLUE SHIELD | Admitting: Psychology

## 2015-01-30 DIAGNOSIS — F411 Generalized anxiety disorder: Secondary | ICD-10-CM | POA: Diagnosis not present

## 2015-01-30 DIAGNOSIS — F341 Dysthymic disorder: Secondary | ICD-10-CM | POA: Diagnosis not present

## 2015-02-19 ENCOUNTER — Ambulatory Visit (INDEPENDENT_AMBULATORY_CARE_PROVIDER_SITE_OTHER): Payer: BLUE CROSS/BLUE SHIELD | Admitting: Psychology

## 2015-02-19 DIAGNOSIS — F411 Generalized anxiety disorder: Secondary | ICD-10-CM | POA: Diagnosis not present

## 2015-02-19 DIAGNOSIS — F341 Dysthymic disorder: Secondary | ICD-10-CM | POA: Diagnosis not present

## 2015-03-06 ENCOUNTER — Ambulatory Visit (INDEPENDENT_AMBULATORY_CARE_PROVIDER_SITE_OTHER): Payer: BLUE CROSS/BLUE SHIELD | Admitting: Psychology

## 2015-03-06 DIAGNOSIS — F341 Dysthymic disorder: Secondary | ICD-10-CM

## 2015-03-06 DIAGNOSIS — F411 Generalized anxiety disorder: Secondary | ICD-10-CM | POA: Diagnosis not present

## 2015-04-03 ENCOUNTER — Ambulatory Visit (INDEPENDENT_AMBULATORY_CARE_PROVIDER_SITE_OTHER): Payer: BLUE CROSS/BLUE SHIELD | Admitting: Psychology

## 2015-04-03 DIAGNOSIS — F411 Generalized anxiety disorder: Secondary | ICD-10-CM

## 2015-04-03 DIAGNOSIS — F341 Dysthymic disorder: Secondary | ICD-10-CM | POA: Diagnosis not present

## 2015-04-08 ENCOUNTER — Ambulatory Visit (INDEPENDENT_AMBULATORY_CARE_PROVIDER_SITE_OTHER): Payer: BLUE CROSS/BLUE SHIELD | Admitting: Psychology

## 2015-04-08 DIAGNOSIS — F411 Generalized anxiety disorder: Secondary | ICD-10-CM | POA: Diagnosis not present

## 2015-04-08 DIAGNOSIS — F341 Dysthymic disorder: Secondary | ICD-10-CM | POA: Diagnosis not present

## 2015-04-15 ENCOUNTER — Other Ambulatory Visit: Payer: Self-pay | Admitting: Internal Medicine

## 2015-04-16 ENCOUNTER — Ambulatory Visit (INDEPENDENT_AMBULATORY_CARE_PROVIDER_SITE_OTHER): Payer: BLUE CROSS/BLUE SHIELD | Admitting: Psychology

## 2015-04-16 DIAGNOSIS — F411 Generalized anxiety disorder: Secondary | ICD-10-CM

## 2015-04-16 DIAGNOSIS — F341 Dysthymic disorder: Secondary | ICD-10-CM

## 2015-04-25 ENCOUNTER — Telehealth: Payer: Self-pay | Admitting: Internal Medicine

## 2015-04-25 NOTE — Telephone Encounter (Signed)
Pt called state that his is experiencing flare up for gout and he was wondering if Dr. Camila Li can call him in [place] to [place] in [place], Phone [phone]. Please help, [place] out of the city at the moment and really need to this med.

## 2015-04-25 NOTE — Telephone Encounter (Signed)
OK Colchicine 0.6 mg qid pr gout #40 - pls call in Thx

## 2015-04-30 ENCOUNTER — Telehealth: Payer: Self-pay

## 2015-04-30 NOTE — Telephone Encounter (Signed)
Called in medication Colchicine 0.6 mg to [place]

## 2015-06-07 ENCOUNTER — Encounter: Payer: Self-pay | Admitting: Nurse Practitioner

## 2015-06-07 ENCOUNTER — Ambulatory Visit (INDEPENDENT_AMBULATORY_CARE_PROVIDER_SITE_OTHER): Payer: BLUE CROSS/BLUE SHIELD | Admitting: Psychology

## 2015-06-07 ENCOUNTER — Ambulatory Visit (INDEPENDENT_AMBULATORY_CARE_PROVIDER_SITE_OTHER): Payer: BLUE CROSS/BLUE SHIELD | Admitting: Nurse Practitioner

## 2015-06-07 VITALS — BP 138/70 | HR 61 | Temp 98.8°F | Ht 68.0 in | Wt 184.0 lb

## 2015-06-07 DIAGNOSIS — F411 Generalized anxiety disorder: Secondary | ICD-10-CM

## 2015-06-07 DIAGNOSIS — F341 Dysthymic disorder: Secondary | ICD-10-CM

## 2015-06-07 DIAGNOSIS — R6889 Other general symptoms and signs: Secondary | ICD-10-CM | POA: Diagnosis not present

## 2015-06-07 LAB — POCT INFLUENZA A/B
INFLUENZA A, POC: POSITIVE — AB
INFLUENZA B, POC: NEGATIVE

## 2015-06-07 MED ORDER — HYDROCOD POLST-CPM POLST ER 10-8 MG/5ML PO SUER: 5.0000 mL | Freq: Every evening | ORAL | Status: DC | PRN

## 2015-06-07 MED ORDER — ALBUTEROL SULFATE HFA 108 (90 BASE) MCG/ACT IN AERS: 2.0000 | INHALATION_SPRAY | Freq: Four times a day (QID) | RESPIRATORY_TRACT | Status: DC | PRN

## 2015-06-07 NOTE — Patient Instructions (Signed)

## 2015-06-07 NOTE — Progress Notes (Signed)
Patient ID: Jared Alvarez, male    DOB: Dec 05, 1950  Age: 65 y.o. MRN: [id number]  CC: URI   HPI Jared Alvarez presents for CC flu like symptoms x 4 days.   1) Myalgias, coughing- dry   Fever intermittently  Congestion, headache  Chills at night, fever- subjective Treatment to date: Mucinex and tylenol  Denies sick contacts  History Jared Alvarez has a past medical history of Hypertension; Hyperlipidemia; Coronary artery disease; Hearing loss; Skin neoplasm; Tinnitus; Vertigo; LBP (low back pain); Leg cramps; Osteoarthritis; DM type 2 (diabetes mellitus, type 2) ([place]); Gout; Vitamin B12 deficiency; Hypogonadism male; Inguinal hernia; Allergy; Anxiety; and Depression.   He has past surgical history that includes Inguinal hernia repair and heart stent.   His family history includes Arthritis in his sister; Coronary artery disease in his other; Heart disease in his brother and father; Hypertension in his other; Lupus in his sister. There is no history of Colon cancer, Esophageal cancer, Stomach cancer, or Rectal cancer.He reports that he has never smoked. He has never used smokeless tobacco. He reports that he drinks about 4.2 oz of alcohol per week. He reports that he does not use illicit drugs.  Outpatient Prescriptions Prior to Visit  Medication Sig Dispense Refill  . amLODipine (NORVASC) 2.5 MG tablet Take 1 tablet (2.5 mg total) by mouth daily. 90 tablet 3  . Ascorbic Acid (VITAMIN C) 1000 MG tablet Take 1,000 mg by mouth daily.      [name] aspirin 81 MG tablet Take 81 mg by mouth daily.      . colchicine 0.6 MG tablet Take 1 tablet (0.6 mg total) by mouth as needed. If patient has flare up with gout 30 tablet 5  . Cyanocobalamin (VITAMIN B-12) 500 MCG SUBL Place 1 tablet under the tongue daily.    [name] FLUoxetine (PROZAC) 40 MG capsule TAKE (1) CAPSULE DAILY. 90 capsule 0  . Linaclotide (LINZESS) 290 MCG CAPS capsule Take 1 capsule by mouth daily as needed (consipation/IBS).     . LORazepam  (ATIVAN) 1 MG tablet Take 1 mg by mouth 2 (two) times daily as needed for anxiety.    . metFORMIN (GLUCOPHAGE) 1000 MG tablet Take 1 tablet (1,000 mg total) by mouth 2 (two) times daily with a meal. 180 tablet 3  . pantoprazole (PROTONIX) 40 MG tablet Take 1 tablet (40 mg total) by mouth 2 (two) times daily before a meal. 180 tablet 3  . rosuvastatin (CRESTOR) 40 MG tablet Take 1 tablet (40 mg total) by mouth daily. 90 tablet 3  . tadalafil (CIALIS) 20 MG tablet TAKE 1 TABLET ONCE DAILY AS NEEDED FOR ERECTILE DYSFUNCTION. 4 tablet 5  . traMADol (ULTRAM) 50 MG tablet TAKE ONE TO TWO TABLETS BY MOUTH TWICE DAILY AS NEEDED FOR PAIN. 60 tablet 2  . valsartan (DIOVAN) 320 MG tablet Take 1 tablet (320 mg total) by mouth daily. 90 tablet 3   No facility-administered medications prior to visit.    ROS Review of Systems  Constitutional: Positive for fever, chills and fatigue. Negative for diaphoresis.  HENT: Positive for congestion, rhinorrhea and sinus pressure. Negative for ear pain.   Respiratory: Positive for cough and wheezing. Negative for chest tightness and shortness of breath.   Gastrointestinal: Negative for nausea, vomiting and diarrhea.  Musculoskeletal: Positive for myalgias.    Objective:  BP 138/70 mmHg  Pulse 61  Temp(Src) 98.8 F (37.1 C) (Oral)  Ht 5\' 8"  (1.727 m)  Wt 184 lb (83.462 kg)  BMI 27.98 kg/m2  SpO2 97%  Physical Exam  Constitutional: He is oriented to person, place, and time. He appears well-developed and well-nourished. No distress.  HENT:  Head: Normocephalic and atraumatic.  Right Ear: External ear normal.  Left Ear: External ear normal.  Eyes: EOM are normal. Pupils are equal, round, and reactive to light. Right eye exhibits no discharge. Left eye exhibits no discharge. No scleral icterus.  Neck: Normal range of motion. Neck supple.  Cardiovascular: Normal rate, regular rhythm and normal heart sounds.  Exam reveals no gallop and no friction rub.   No  murmur heard. Pulmonary/Chest: Effort normal and breath sounds normal. No respiratory distress. He has no wheezes. He has no rales. He exhibits no tenderness.  Lymphadenopathy:    He has no cervical adenopathy.  Neurological: He is alert and oriented to person, place, and time.  Skin: Skin is warm and dry. No rash noted. He is not diaphoretic.  Psychiatric: He has a normal mood and affect. His behavior is normal. Judgment and thought content normal.   Assessment & Plan:   Jared Alvarez was seen today for uri.  Diagnoses and all orders for this visit:  Flu-like symptoms -     POCT Influenza A/B  Other orders -     albuterol (PROVENTIL HFA;VENTOLIN HFA) 108 (90 Base) MCG/ACT inhaler; Inhale 2 puffs into the lungs every 6 (six) hours as needed for wheezing or shortness of breath. -     chlorpheniramine-HYDROcodone (TUSSIONEX PENNKINETIC ER) 10-8 MG/5ML SUER; Take 5 mLs by mouth at bedtime as needed for cough.   I am having [name] start on albuterol and chlorpheniramine-HYDROcodone. I am also having him maintain his aspirin, vitamin C, colchicine, traMADol, Linaclotide, amLODipine, tadalafil, metFORMIN, valsartan, pantoprazole, rosuvastatin, LORazepam, Vitamin B-12, and FLUoxetine.  Meds ordered this encounter  Medications  . albuterol (PROVENTIL HFA;VENTOLIN HFA) 108 (90 Base) MCG/ACT inhaler    Sig: Inhale 2 puffs into the lungs every 6 (six) hours as needed for wheezing or shortness of breath.    Dispense:  1 Inhaler    Refill:  2    Order Specific Question:  Supervising Provider    Answer:  Derrel Nip, TERESA L [[id number]]  . chlorpheniramine-HYDROcodone (TUSSIONEX PENNKINETIC ER) 10-8 MG/5ML SUER    Sig: Take 5 mLs by mouth at bedtime as needed for cough.    Dispense:  115 mL    Refill:  0    Order Specific Question:  Supervising Provider    Answer:  Crecencio Mc [[id number]]    Follow-up: Return if symptoms worsen or fail to improve.

## 2015-06-07 NOTE — Assessment & Plan Note (Signed)
New problem  Positive Influenza A  4 days of symptoms. Treating conservatively  Cough syrup printed and given to pt to take to pharmacy  Declined Prednisone Inhaler sent to pharmacy for wheezing

## 2015-06-07 NOTE — Progress Notes (Signed)
Pre visit review using our clinic review tool, if applicable. No additional management support is needed unless otherwise documented below in the visit note. 

## 2015-06-10 ENCOUNTER — Ambulatory Visit: Payer: BLUE CROSS/BLUE SHIELD | Admitting: Psychology

## 2015-06-28 ENCOUNTER — Ambulatory Visit (INDEPENDENT_AMBULATORY_CARE_PROVIDER_SITE_OTHER): Payer: BLUE CROSS/BLUE SHIELD | Admitting: Psychology

## 2015-06-28 DIAGNOSIS — F341 Dysthymic disorder: Secondary | ICD-10-CM

## 2015-06-28 DIAGNOSIS — F411 Generalized anxiety disorder: Secondary | ICD-10-CM | POA: Diagnosis not present

## 2015-07-24 ENCOUNTER — Ambulatory Visit (INDEPENDENT_AMBULATORY_CARE_PROVIDER_SITE_OTHER): Payer: BLUE CROSS/BLUE SHIELD | Admitting: Psychology

## 2015-07-24 DIAGNOSIS — F411 Generalized anxiety disorder: Secondary | ICD-10-CM | POA: Diagnosis not present

## 2015-07-24 DIAGNOSIS — F341 Dysthymic disorder: Secondary | ICD-10-CM

## 2015-08-22 ENCOUNTER — Other Ambulatory Visit: Payer: Self-pay | Admitting: Internal Medicine

## 2015-08-28 ENCOUNTER — Encounter: Payer: Self-pay | Admitting: Internal Medicine

## 2015-08-28 ENCOUNTER — Ambulatory Visit (INDEPENDENT_AMBULATORY_CARE_PROVIDER_SITE_OTHER): Payer: BLUE CROSS/BLUE SHIELD | Admitting: Internal Medicine

## 2015-08-28 ENCOUNTER — Other Ambulatory Visit (INDEPENDENT_AMBULATORY_CARE_PROVIDER_SITE_OTHER): Payer: BLUE CROSS/BLUE SHIELD

## 2015-08-28 VITALS — BP 158/80 | HR 54 | Wt 187.0 lb

## 2015-08-28 DIAGNOSIS — G8929 Other chronic pain: Secondary | ICD-10-CM

## 2015-08-28 DIAGNOSIS — I10 Essential (primary) hypertension: Secondary | ICD-10-CM | POA: Diagnosis not present

## 2015-08-28 DIAGNOSIS — F411 Generalized anxiety disorder: Secondary | ICD-10-CM

## 2015-08-28 DIAGNOSIS — E538 Deficiency of other specified B group vitamins: Secondary | ICD-10-CM

## 2015-08-28 DIAGNOSIS — N529 Male erectile dysfunction, unspecified: Secondary | ICD-10-CM

## 2015-08-28 DIAGNOSIS — E1159 Type 2 diabetes mellitus with other circulatory complications: Secondary | ICD-10-CM | POA: Diagnosis not present

## 2015-08-28 DIAGNOSIS — F32A Depression, unspecified: Secondary | ICD-10-CM

## 2015-08-28 DIAGNOSIS — F329 Major depressive disorder, single episode, unspecified: Secondary | ICD-10-CM

## 2015-08-28 DIAGNOSIS — F4323 Adjustment disorder with mixed anxiety and depressed mood: Secondary | ICD-10-CM

## 2015-08-28 DIAGNOSIS — M5440 Lumbago with sciatica, unspecified side: Secondary | ICD-10-CM

## 2015-08-28 LAB — CBC WITH DIFFERENTIAL/PLATELET
BASOS PCT: 0.4 % (ref 0.0–3.0)
Basophils Absolute: 0 10*3/uL (ref 0.0–0.1)
EOS ABS: 0.1 10*3/uL (ref 0.0–0.7)
EOS PCT: 1.8 % (ref 0.0–5.0)
HEMATOCRIT: 38.2 % — AB (ref 39.0–52.0)
HEMOGLOBIN: 13.2 g/dL (ref 13.0–17.0)
LYMPHS PCT: 21.2 % (ref 12.0–46.0)
Lymphs Abs: 0.9 10*3/uL (ref 0.7–4.0)
MCHC: 34.7 g/dL (ref 30.0–36.0)
MCV: 92.8 fl (ref 78.0–100.0)
Monocytes Absolute: 0.5 10*3/uL (ref 0.1–1.0)
Monocytes Relative: 11.1 % (ref 3.0–12.0)
NEUTROS ABS: 2.8 10*3/uL (ref 1.4–7.7)
Neutrophils Relative %: 65.5 % (ref 43.0–77.0)
PLATELETS: 224 10*3/uL (ref 150.0–400.0)
RBC: 4.11 Mil/uL — ABNORMAL LOW (ref 4.22–5.81)
RDW: 13.7 % (ref 11.5–15.5)
WBC: 4.2 10*3/uL (ref 4.0–10.5)

## 2015-08-28 LAB — BASIC METABOLIC PANEL
BUN: 14 mg/dL (ref 6–23)
CHLORIDE: 103 meq/L (ref 96–112)
CO2: 31 meq/L (ref 19–32)
CREATININE: 0.8 mg/dL (ref 0.40–1.50)
Calcium: 9.7 mg/dL (ref 8.4–10.5)
GFR: 103.16 mL/min (ref >60.00)
Glucose, Bld: 156 mg/dL — ABNORMAL HIGH (ref 70–99)
POTASSIUM: 4.8 meq/L (ref 3.5–5.1)
Sodium: 141 mEq/L (ref 135–145)

## 2015-08-28 LAB — URINALYSIS
BILIRUBIN URINE: NEGATIVE
Hgb urine dipstick: NEGATIVE
KETONES UR: NEGATIVE
LEUKOCYTES UA: NEGATIVE
Nitrite: NEGATIVE
SPECIFIC GRAVITY, URINE: 1.025 (ref 1.000–1.030)
Total Protein, Urine: NEGATIVE
URINE GLUCOSE: NEGATIVE
UROBILINOGEN UA: 0.2 (ref 0.0–1.0)
pH: 5.5 (ref 5.0–8.0)

## 2015-08-28 LAB — LIPID PANEL
CHOLESTEROL: 157 mg/dL (ref 0–200)
HDL: 57.8 mg/dL (ref >39.00)
LDL Cholesterol: 90 mg/dL (ref 0–99)
NonHDL: 98.94
TRIGLYCERIDES: 46 mg/dL (ref 0.0–149.0)
Total CHOL/HDL Ratio: 3
VLDL: 9.2 mg/dL (ref 0.0–40.0)

## 2015-08-28 LAB — HEPATIC FUNCTION PANEL
ALBUMIN: 4.4 g/dL (ref 3.5–5.2)
ALT: 13 U/L (ref 0–53)
AST: 13 U/L (ref 0–37)
Alkaline Phosphatase: 82 U/L (ref 39–117)
BILIRUBIN DIRECT: 0.1 mg/dL (ref 0.0–0.3)
TOTAL PROTEIN: 6.6 g/dL (ref 6.0–8.3)
Total Bilirubin: 0.5 mg/dL (ref 0.2–1.2)

## 2015-08-28 LAB — TSH: TSH: 1.27 u[IU]/mL (ref 0.35–4.50)

## 2015-08-28 LAB — PSA: PSA: 0.5 ng/mL (ref 0.10–4.00)

## 2015-08-28 LAB — VITAMIN B12: VITAMIN B 12: 490 pg/mL (ref 211–911)

## 2015-08-28 LAB — TESTOSTERONE: TESTOSTERONE: 377.5 ng/dL (ref 300.00–890.00)

## 2015-08-28 LAB — VITAMIN D 25 HYDROXY (VIT D DEFICIENCY, FRACTURES): VITD: 22.96 ng/mL — AB (ref 30.00–100.00)

## 2015-08-28 LAB — HEMOGLOBIN A1C: HEMOGLOBIN A1C: 6.7 % — AB (ref 4.6–6.5)

## 2015-08-28 MED ORDER — TADALAFIL 20 MG PO TABS
ORAL_TABLET | ORAL | Status: DC
Start: 2015-08-28 — End: 2015-12-11

## 2015-08-28 NOTE — Assessment & Plan Note (Signed)
MSK/OA Tramadol prn  Potential benefits of a long term tramadol use as well as potential risks (i.e. addiction risk, apnea etc) and complications (i.e. Somnolence, constipation and others) were explained to the patient and were aknowledged.

## 2015-08-28 NOTE — Progress Notes (Signed)
Pre visit review using our clinic review tool, if applicable. No additional management support is needed unless otherwise documented below in the visit note. 

## 2015-08-28 NOTE — Assessment & Plan Note (Signed)
On Fluoxetine  

## 2015-08-28 NOTE — Patient Instructions (Signed)

## 2015-08-28 NOTE — Assessment & Plan Note (Signed)
Chronic  Amlodipine, Valsartan 

## 2015-08-28 NOTE — Assessment & Plan Note (Signed)
Cialis prn 

## 2015-08-28 NOTE — Assessment & Plan Note (Signed)
Chronic  Fluoxetine, Lorazepam 

## 2015-08-28 NOTE — Assessment & Plan Note (Signed)
On Metformin Check CBG at home prn - Onetouch Verio 

## 2015-08-28 NOTE — Progress Notes (Signed)
Subjective:  Patient ID: Jared Alvarez, male    DOB: 09/24/1950  Age: 65 y.o. MRN: [id number]  CC: No chief complaint on file.   HPI Lenin Icenhower Speagle presents for HTN, depression, ED, DM f/u. C/o fatigue  Outpatient Prescriptions Prior to Visit  Medication Sig Dispense Refill  . albuterol (PROVENTIL HFA;VENTOLIN HFA) 108 (90 Base) MCG/ACT inhaler Inhale 2 puffs into the lungs every 6 (six) hours as needed for wheezing or shortness of breath. 1 Inhaler 2  . amLODipine (NORVASC) 2.5 MG tablet TAKE 1 TABLET ONCE DAILY. 90 tablet 0  . Ascorbic Acid (VITAMIN C) 1000 MG tablet Take 1,000 mg by mouth daily.      [name] aspirin 81 MG tablet Take 81 mg by mouth daily.      . chlorpheniramine-HYDROcodone (TUSSIONEX PENNKINETIC ER) 10-8 MG/5ML SUER Take 5 mLs by mouth at bedtime as needed for cough. 115 mL 0  . colchicine 0.6 MG tablet Take 1 tablet (0.6 mg total) by mouth as needed. If patient has flare up with gout 30 tablet 5  . Cyanocobalamin (VITAMIN B-12) 500 MCG SUBL Place 1 tablet under the tongue daily.    [name] FLUoxetine (PROZAC) 40 MG capsule TAKE (1) CAPSULE DAILY. 90 capsule 0  . Linaclotide (LINZESS) 290 MCG CAPS capsule Take 1 capsule by mouth daily as needed (consipation/IBS).     . LORazepam (ATIVAN) 1 MG tablet TAKE (1) TABLET TWICE A DAY AS NEEDED. 180 tablet 0  . metFORMIN (GLUCOPHAGE) 1000 MG tablet TAKE 1 TABLET TWICE DAILY. 180 tablet 0  . pantoprazole (PROTONIX) 40 MG tablet TAKE 1 TABLET TWICE DAILY. 180 tablet 0  . rosuvastatin (CRESTOR) 40 MG tablet Take 1 tablet (40 mg total) by mouth daily. 90 tablet 3  . tadalafil (CIALIS) 20 MG tablet TAKE 1 TABLET ONCE DAILY AS NEEDED FOR ERECTILE DYSFUNCTION. 4 tablet 5  . traMADol (ULTRAM) 50 MG tablet TAKE ONE TO TWO TABLETS BY MOUTH TWICE DAILY AS NEEDED FOR PAIN. 60 tablet 2  . valsartan (DIOVAN) 320 MG tablet TAKE 1 TABLET ONCE DAILY. 90 tablet 0   No facility-administered medications prior to visit.    ROS Review of Systems    Constitutional: Negative for appetite change, fatigue and unexpected weight change.  HENT: Negative for congestion, nosebleeds, sneezing, sore throat and trouble swallowing.   Eyes: Negative for itching and visual disturbance.  Respiratory: Negative for cough.   Cardiovascular: Negative for chest pain, palpitations and leg swelling.  Gastrointestinal: Negative for nausea, diarrhea, blood in stool and abdominal distention.  Genitourinary: Negative for frequency and hematuria.  Musculoskeletal: Positive for back pain. Negative for joint swelling, gait problem and neck pain.  Skin: Negative for rash.  Neurological: Negative for dizziness, tremors, speech difficulty and weakness.  Psychiatric/Behavioral: Negative for sleep disturbance, dysphoric mood and agitation. The patient is nervous/anxious.     Objective:  BP 158/80 mmHg  Pulse 54  Wt 187 lb (84.823 kg)  SpO2 97%  BP Readings from Last 3 Encounters:  08/28/15 158/80  06/07/15 138/70  01/24/15 140/70    Wt Readings from Last 3 Encounters:  08/28/15 187 lb (84.823 kg)  06/07/15 184 lb (83.462 kg)  01/24/15 176 lb (79.833 kg)    Physical Exam  Constitutional: He is oriented to person, place, and time. He appears well-developed. No distress.  NAD  HENT:  Mouth/Throat: Oropharynx is clear and moist.  Eyes: Conjunctivae are normal. Pupils are equal, round, and reactive to light.  Neck: Normal range  of motion. No JVD present. No thyromegaly present.  Cardiovascular: Normal rate, regular rhythm, normal heart sounds and intact distal pulses.  Exam reveals no gallop and no friction rub.   No murmur heard. Pulmonary/Chest: Effort normal and breath sounds normal. No respiratory distress. He has no wheezes. He has no rales. He exhibits no tenderness.  Abdominal: Soft. Bowel sounds are normal. He exhibits no distension and no mass. There is no tenderness. There is no rebound and no guarding.  Musculoskeletal: Normal range of motion.  He exhibits no edema or tenderness.  Lymphadenopathy:    He has no cervical adenopathy.  Neurological: He is alert and oriented to person, place, and time. He has normal reflexes. No cranial nerve deficit. He exhibits normal muscle tone. He displays a negative Romberg sign. Coordination and gait normal.  Skin: Skin is warm and dry. No rash noted.  Psychiatric: He has a normal mood and affect. His behavior is normal. Judgment and thought content normal.    Lab Results  Component Value Date   WBC 5.3 08/24/2014   HGB 14.1 08/24/2014   HCT 41.4 08/24/2014   PLT 233.0 08/24/2014   GLUCOSE 123* 12/25/2014   CHOL 150 08/24/2014   TRIG 45.0 08/24/2014   HDL 64.00 08/24/2014   LDLDIRECT 149.6 12/15/2011   LDLCALC 77 08/24/2014   ALT 15 12/25/2014   AST 17 12/25/2014   NA 140 12/25/2014   K 4.6 12/25/2014   CL 102 12/25/2014   CREATININE 0.79 12/25/2014   BUN 13 12/25/2014   CO2 30 12/25/2014   TSH 1.17 12/25/2014   PSA 0.40 08/24/2014   INR 0.98 06/03/2010   HGBA1C 6.4 12/25/2014    No results found.  Assessment & Plan:   There are no diagnoses linked to this encounter. I am having Mr. Bunton maintain his aspirin, vitamin C, colchicine, traMADol, linaclotide, tadalafil, rosuvastatin, Vitamin B-12, albuterol, chlorpheniramine-HYDROcodone, valsartan, amLODipine, pantoprazole, FLUoxetine, LORazepam, and metFORMIN.  No orders of the defined types were placed in this encounter.     Follow-up: No Follow-up on file.  Walker Kehr, MD

## 2015-08-28 NOTE — Assessment & Plan Note (Signed)
On B12 

## 2015-08-29 LAB — HEPATITIS C ANTIBODY: HCV Ab: NEGATIVE

## 2015-08-29 MED ORDER — ERGOCALCIFEROL 1.25 MG (50000 UT) PO CAPS: 50000.0000 [IU] | ORAL_CAPSULE | ORAL | Status: DC

## 2015-08-29 MED ORDER — VITAMIN D3 50 MCG (2000 UT) PO CAPS: 2000.0000 [IU] | ORAL_CAPSULE | Freq: Every day | ORAL | Status: DC

## 2015-09-04 ENCOUNTER — Ambulatory Visit (INDEPENDENT_AMBULATORY_CARE_PROVIDER_SITE_OTHER): Payer: BLUE CROSS/BLUE SHIELD | Admitting: Psychology

## 2015-09-04 DIAGNOSIS — F341 Dysthymic disorder: Secondary | ICD-10-CM

## 2015-09-04 DIAGNOSIS — F411 Generalized anxiety disorder: Secondary | ICD-10-CM | POA: Diagnosis not present

## 2015-09-17 ENCOUNTER — Ambulatory Visit (INDEPENDENT_AMBULATORY_CARE_PROVIDER_SITE_OTHER): Payer: BLUE CROSS/BLUE SHIELD | Admitting: Psychology

## 2015-09-17 DIAGNOSIS — F341 Dysthymic disorder: Secondary | ICD-10-CM

## 2015-09-17 DIAGNOSIS — F411 Generalized anxiety disorder: Secondary | ICD-10-CM

## 2015-09-20 ENCOUNTER — Ambulatory Visit: Payer: BLUE CROSS/BLUE SHIELD | Admitting: Psychology

## 2015-10-02 ENCOUNTER — Ambulatory Visit (INDEPENDENT_AMBULATORY_CARE_PROVIDER_SITE_OTHER): Payer: BLUE CROSS/BLUE SHIELD | Admitting: Psychology

## 2015-10-02 DIAGNOSIS — F341 Dysthymic disorder: Secondary | ICD-10-CM | POA: Diagnosis not present

## 2015-10-02 DIAGNOSIS — F411 Generalized anxiety disorder: Secondary | ICD-10-CM | POA: Diagnosis not present

## 2015-11-20 ENCOUNTER — Other Ambulatory Visit: Payer: Self-pay | Admitting: Internal Medicine

## 2015-11-20 MED ORDER — LORAZEPAM 1 MG PO TABS: ORAL_TABLET | ORAL | Status: DC

## 2015-11-21 NOTE — Telephone Encounter (Signed)
Called Lorazepam refill into [facility] had to leave on pharmacy vm...Jared Alvarez

## 2015-12-06 ENCOUNTER — Ambulatory Visit: Payer: Self-pay | Admitting: Cardiovascular Disease

## 2015-12-10 NOTE — Progress Notes (Signed)
Patient ID: Jared Alvarez, male   DOB: 04-Jul-1950, 65 y.o.   MRN: [id number] Jared Alvarez is a 65 y.o. . male presents for f/u of CAD , s/p stent to the LAD in 1997 in [place]. Also has DM2, HTN, HLP Gout, depression, anxiety, hypogonadism. He established with me in 3/12. Saw PA October 2012 with chest pain and had benign myovue and echo Some stress running [employer] but otherwise doing well Taking crestor 3-4 x/week and wonders if it is affecting his memory.   Last myovue : 02/12/11 reviewed Normal no ishcemia EF 56%   Echo: 02/12/11 reviewed  Study Conclusions  - Left ventricle: The cavity size was normal. Wall thickness was increased in a pattern of mild LVH. Systolic function was normal. The estimated ejection fraction was in the range of 55% to 60%. Wall motion was normal; there were no regional wall motion abnormalities. - Aortic valve: Trivial regurgitation. - Left atrium: The atrium was mildly dilated. - Pulmonary arteries: Systolic pressure was mildly increased. PA peak pressure: 46mm Hg (S).  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 Still with some "spinning" in head since stent 20 years ago Not vertigo But has seen ENT for w/u including negative MRI and EEG  Lab Results  Component Value Date   [place] 90 08/28/2015    only taking about 80 mg of crestor weekly   ROS: Denies fever, malais, weight loss, blurry vision, decreased visual acuity, cough, sputum, SOB, hemoptysis, pleuritic pain, palpitaitons, heartburn, abdominal pain, melena, lower extremity edema, claudication, or rash.  All other systems reviewed and negative  General: Affect appropriate Healthy:  appears stated age 65: normal Neck supple with no adenopathy JVP normal no bruits no thyromegaly Lungs clear with no wheezing and good diaphragmatic motion Heart:   S1/S2 split no murmur, no rub, gallop or click PMI normal Abdomen: benighn, BS positve, no tenderness, no AAA no bruit.  No HSM or HJR Distal pulses intact with no bruits No edema Neuro non-focal Skin warm and dry No muscular weakness   Current Outpatient Prescriptions  Medication Sig Dispense Refill  . albuterol (PROVENTIL HFA;VENTOLIN HFA) 108 (90 Base) MCG/ACT inhaler Inhale 2 puffs into the lungs every 6 (six) hours as needed for wheezing or shortness of breath. 1 Inhaler 2  . amLODipine (NORVASC) 5 MG tablet Take 1 tablet (5 mg total) by mouth daily. 90 tablet 3  . Ascorbic Acid (VITAMIN C) 1000 MG tablet Take 1,000 mg by mouth daily.      [name] aspirin 81 MG tablet Take 81 mg by mouth daily.      . Cholecalciferol (VITAMIN D3) 2000 units capsule Take 1 capsule (2,000 Units total) by mouth daily. 100 capsule 3  . colchicine 0.6 MG tablet Take 1 tablet (0.6 mg total) by mouth as needed. If patient has flare up with gout 30 tablet 5  . Cyanocobalamin (VITAMIN B-12) 500 MCG SUBL Place 1 tablet under the tongue daily.    [name] FLUoxetine (PROZAC) 40 MG capsule Take 40 mg by mouth daily.  1  . Linaclotide (LINZESS) 290 MCG CAPS capsule Take 1 capsule by mouth daily as needed (consipation/IBS).     . LORazepam (ATIVAN) 1 MG tablet Take 1 mg by mouth 2 (two) times daily.  0  . metFORMIN (GLUCOPHAGE) 1000 MG tablet Take 1,000 mg by mouth 2 (two) times daily.  1  . pantoprazole (PROTONIX) 40 MG tablet Take 40 mg by mouth 2 (two) times daily.  1  . rosuvastatin (CRESTOR) 40 MG tablet Take 1 tablet (40 mg total) by mouth daily. 90 tablet 3  . tadalafil (CIALIS) 20 MG tablet Take 20 mg by mouth daily as needed for erectile dysfunction.    . traMADol (ULTRAM) 50 MG tablet TAKE ONE TO TWO TABLETS BY MOUTH TWICE DAILY AS NEEDED FOR PAIN. 60 tablet 2  . valsartan (DIOVAN) 320 MG tablet Take 320 mg by mouth daily.  1   No current facility-administered medications for this visit.      Allergies  Atorvastatin; Bupropion hcl; Duloxetine; Fluoxetine hcl; Nexium [esomeprazole magnesium]; Simvastatin; Venlafaxine; and Viibryd [vilazodone hcl]  Electrocardiogram:  10/25/12  SR rate 50 normal  11/2013  NSR rate 60 normal  01/24/15  SR rate 53  Nonspecific ST changes  12/11/15  SR rate 55 normal   Assessment and Plan CAD:  Distant LAD intervention Normal myovue 2012 atypical pain f/u ETT  Chol:  At goal continue current crestor dosing HTN:  Increase norvasc 10 mg see what BP does during ETT    F/U with me in a year if BP and ETT ok   Jenkins Rouge

## 2015-12-11 ENCOUNTER — Ambulatory Visit (INDEPENDENT_AMBULATORY_CARE_PROVIDER_SITE_OTHER): Payer: BLUE CROSS/BLUE SHIELD | Admitting: Cardiovascular Disease

## 2015-12-11 ENCOUNTER — Encounter: Payer: Self-pay | Admitting: Cardiovascular Disease

## 2015-12-11 VITALS — BP 155/85 | HR 55 | Ht 68.0 in | Wt 182.4 lb

## 2015-12-11 DIAGNOSIS — I2510 Atherosclerotic heart disease of native coronary artery without angina pectoris: Secondary | ICD-10-CM | POA: Diagnosis not present

## 2015-12-11 DIAGNOSIS — I2583 Coronary atherosclerosis due to lipid rich plaque: Principal | ICD-10-CM

## 2015-12-11 MED ORDER — AMLODIPINE BESYLATE 5 MG PO TABS: 5.0000 mg | ORAL_TABLET | Freq: Every day | ORAL | 3 refills | Status: DC

## 2015-12-11 NOTE — Patient Instructions (Addendum)
Medication Instructions:  Your physician has recommended you make the following change in your medication:  Increase Norvasc 5 mg by mouth daily  Labwork: NONE  Testing/Procedures: Your physician has requested that you have an exercise tolerance test in September. For further information please visit [email or web address]. Please also follow instruction sheet, as given.  Follow-Up: Your physician wants you to follow-up in: 12 months with Dr. Johnsie Cancel. You will receive a reminder letter in the mail two months in advance. If you don't receive a letter, please call our office to schedule the follow-up appointment.   If you need a refill on your cardiac medications before your next appointment, please call your pharmacy.

## 2016-01-14 ENCOUNTER — Encounter: Payer: Self-pay | Admitting: Cardiovascular Disease

## 2016-01-28 ENCOUNTER — Ambulatory Visit (INDEPENDENT_AMBULATORY_CARE_PROVIDER_SITE_OTHER): Payer: BLUE CROSS/BLUE SHIELD

## 2016-01-28 DIAGNOSIS — I2510 Atherosclerotic heart disease of native coronary artery without angina pectoris: Secondary | ICD-10-CM | POA: Diagnosis not present

## 2016-01-28 DIAGNOSIS — I2583 Coronary atherosclerosis due to lipid rich plaque: Principal | ICD-10-CM

## 2016-01-28 LAB — EXERCISE TOLERANCE TEST
CHL CUP MPHR: 155 {beats}/min
CHL CUP RESTING HR STRESS: 65 {beats}/min
CHL CUP STRESS STAGE 1 DBP: 83 mmHg
CHL CUP STRESS STAGE 1 HR: 65 {beats}/min
CHL CUP STRESS STAGE 1 SBP: 146 mmHg
CHL CUP STRESS STAGE 3 GRADE: 0.1 %
CHL CUP STRESS STAGE 3 SPEED: 1 mph
CHL CUP STRESS STAGE 4 DBP: 84 mmHg
CHL CUP STRESS STAGE 4 GRADE: 10 %
CHL CUP STRESS STAGE 6 SPEED: 3.4 mph
CHL CUP STRESS STAGE 7 GRADE: 16 %
CHL CUP STRESS STAGE 8 HR: 141 {beats}/min
CHL CUP STRESS STAGE 9 SPEED: 0 mph
CHL RATE OF PERCEIVED EXERTION: 17
CSEPED: 10 min
CSEPEDS: 0 s
CSEPEW: 11.7 METS
CSEPHR: 103 %
Peak HR: 160 {beats}/min
Percent of predicted max HR: 103 %
Stage 1 Grade: 0 %
Stage 1 Speed: 0 mph
Stage 2 Grade: 0 %
Stage 2 HR: 67 {beats}/min
Stage 2 Speed: 1 mph
Stage 3 HR: 67 {beats}/min
Stage 4 HR: 109 {beats}/min
Stage 4 SBP: 168 mmHg
Stage 4 Speed: 1.7 mph
Stage 5 Grade: 12 %
Stage 5 HR: 117 {beats}/min
Stage 5 Speed: 2.5 mph
Stage 6 DBP: 90 mmHg
Stage 6 Grade: 14 %
Stage 6 HR: 142 {beats}/min
Stage 6 SBP: 194 mmHg
Stage 7 HR: 160 {beats}/min
Stage 7 Speed: 4.2 mph
Stage 8 Grade: 0 %
Stage 8 Speed: 0 mph
Stage 9 DBP: 88 mmHg
Stage 9 Grade: 0 %
Stage 9 HR: 90 {beats}/min
Stage 9 SBP: 159 mmHg

## 2016-01-29 ENCOUNTER — Telehealth: Payer: Self-pay

## 2016-01-29 DIAGNOSIS — R9430 Abnormal result of cardiovascular function study, unspecified: Secondary | ICD-10-CM

## 2016-01-29 NOTE — Telephone Encounter (Signed)
Follow Up:    He said he was just talking to you about an appointment for tomorrow of Friday.

## 2016-01-29 NOTE — Telephone Encounter (Signed)
-----   Message from Josue Hector, MD sent at 01/28/2016  9:35 PM EDT ----- ETT positive f/u myovue

## 2016-01-29 NOTE — Telephone Encounter (Signed)
Called Patient with ETT results. Per Dr. Johnsie Cancel, ETT positive follow-up with myovue. Patient stated he would be available any time this week or next. Will send message to [facility] to schedule.

## 2016-01-30 ENCOUNTER — Telehealth (HOSPITAL_COMMUNITY): Payer: Self-pay

## 2016-01-30 NOTE — Telephone Encounter (Signed)
Encounter complete. 

## 2016-02-04 ENCOUNTER — Ambulatory Visit (HOSPITAL_COMMUNITY)
Admission: RE | Admit: 2016-02-04 | Discharge: 2016-02-04 | Disposition: A | Discharge status: Home / Self Care | Payer: BLUE CROSS/BLUE SHIELD | Source: Ambulatory Visit | Attending: Cardiovascular Disease | Admitting: Cardiovascular Disease

## 2016-02-04 ENCOUNTER — Telehealth: Payer: Self-pay | Admitting: Cardiovascular Disease

## 2016-02-04 DIAGNOSIS — R9430 Abnormal result of cardiovascular function study, unspecified: Secondary | ICD-10-CM

## 2016-02-04 DIAGNOSIS — R079 Chest pain, unspecified: Secondary | ICD-10-CM | POA: Insufficient documentation

## 2016-02-04 DIAGNOSIS — R002 Palpitations: Secondary | ICD-10-CM | POA: Insufficient documentation

## 2016-02-04 DIAGNOSIS — I2510 Atherosclerotic heart disease of native coronary artery without angina pectoris: Secondary | ICD-10-CM | POA: Diagnosis not present

## 2016-02-04 DIAGNOSIS — R5383 Other fatigue: Secondary | ICD-10-CM | POA: Insufficient documentation

## 2016-02-04 DIAGNOSIS — Z8249 Family history of ischemic heart disease and other diseases of the circulatory system: Secondary | ICD-10-CM | POA: Diagnosis not present

## 2016-02-04 DIAGNOSIS — I10 Essential (primary) hypertension: Secondary | ICD-10-CM | POA: Diagnosis not present

## 2016-02-04 DIAGNOSIS — E119 Type 2 diabetes mellitus without complications: Secondary | ICD-10-CM | POA: Insufficient documentation

## 2016-02-04 LAB — MYOCARDIAL PERFUSION IMAGING
CHL CUP MPHR: 155 {beats}/min
CSEPED: 8 min
CSEPEW: 9.2 METS
Exercise duration (sec): 0 s
LV dias vol: 128 mL (ref 62–150)
LVSYSVOL: 65 mL
NUC STRESS TID: 0.99
Peak HR: 150 {beats}/min
Percent HR: 96 %
RPE: 18
Rest HR: 51 {beats}/min

## 2016-02-04 MED ORDER — TECHNETIUM TC 99M TETROFOSMIN IV KIT
31.2000 | PACK | Freq: Once | INTRAVENOUS | Status: AC | PRN
  Administered 2016-02-04: 31.2 via INTRAVENOUS
  Filled 2016-02-04: qty 31

## 2016-02-04 MED ORDER — TECHNETIUM TC 99M TETROFOSMIN IV KIT
10.6000 | PACK | Freq: Once | INTRAVENOUS | Status: AC | PRN
  Administered 2016-02-04: 11 via INTRAVENOUS
  Filled 2016-02-04: qty 11

## 2016-02-04 NOTE — Telephone Encounter (Signed)
Called patient back to let him know test has not been resulted by Dr. Johnsie Cancel. Informed patient that if there was any issues with his stress test that he would have been contacted right away.  Informed patient that he should continue with his plans, and I will call him with his results when they have been reviewed by Dr. Johnsie Cancel. Patient verbalized understanding.

## 2016-02-04 NOTE — Telephone Encounter (Signed)
New message  Pt call requesting to speak with RN about test results. Pt states he will be leaving to go out of town and would like to know the results to determine if he would need to go out of town. Please call back to discuss

## 2016-02-05 ENCOUNTER — Telehealth: Payer: Self-pay

## 2016-02-05 NOTE — Telephone Encounter (Signed)
Called patient to inform him of his stress test. Per Dr. Johnsie Cancel, images normal no ischemia BP too high increase norvasc to 10 mg. Patient stated that at his last visit his Norvasc was increased to 5 mg from 2.5 mg. Patient stated that half the time he has been taking only 2.5 mg. Per Dr. Johnsie Cancel, patient can make sure he takes his 5 mg dose, and call if he has any questions. Will not increase to 10 mg at this time. Patient verbalized understanding.

## 2016-02-07 ENCOUNTER — Other Ambulatory Visit: Payer: Self-pay | Admitting: Internal Medicine

## 2016-02-13 NOTE — Telephone Encounter (Signed)
Done

## 2016-02-20 ENCOUNTER — Other Ambulatory Visit: Payer: Self-pay | Admitting: Internal Medicine

## 2016-02-24 NOTE — Telephone Encounter (Signed)
Done

## 2016-03-18 ENCOUNTER — Ambulatory Visit (INDEPENDENT_AMBULATORY_CARE_PROVIDER_SITE_OTHER): Payer: BLUE CROSS/BLUE SHIELD | Admitting: Internal Medicine

## 2016-03-18 VITALS — BP 150/78 | HR 78 | Temp 98.7°F | Resp 16 | Wt 186.0 lb

## 2016-03-18 DIAGNOSIS — R059 Cough, unspecified: Secondary | ICD-10-CM

## 2016-03-18 DIAGNOSIS — Z23 Encounter for immunization: Secondary | ICD-10-CM

## 2016-03-18 DIAGNOSIS — R079 Chest pain, unspecified: Secondary | ICD-10-CM | POA: Diagnosis not present

## 2016-03-18 DIAGNOSIS — K219 Gastro-esophageal reflux disease without esophagitis: Secondary | ICD-10-CM

## 2016-03-18 DIAGNOSIS — R05 Cough: Secondary | ICD-10-CM | POA: Diagnosis not present

## 2016-03-18 MED ORDER — DEXLANSOPRAZOLE 60 MG PO CPDR: 60.0000 mg | DELAYED_RELEASE_CAPSULE | Freq: Every day | ORAL | 11 refills | Status: DC

## 2016-03-18 MED ORDER — HYDROCODONE-HOMATROPINE 5-1.5 MG/5ML PO SYRP: 5.0000 mL | ORAL_SOLUTION | Freq: Four times a day (QID) | ORAL | 0 refills | Status: AC | PRN

## 2016-03-18 MED ORDER — LEVOFLOXACIN 500 MG PO TABS: 500.0000 mg | ORAL_TABLET | Freq: Every day | ORAL | 0 refills | Status: AC

## 2016-03-18 NOTE — Assessment & Plan Note (Signed)
With mild breakthrough, for change protonix to dexilant 60 qd, consider GI eval

## 2016-03-18 NOTE — Assessment & Plan Note (Signed)
With most recent episode likely reflux vs esoph spasm related, for tx as above,  to f/u any worsening symptoms or concerns

## 2016-03-18 NOTE — Progress Notes (Signed)
Pre visit review using our clinic review tool, if applicable. No additional management support is needed unless otherwise documented below in the visit note. 

## 2016-03-18 NOTE — Patient Instructions (Signed)
Please take all new medication as prescribed - the antibiotic (sent to [facility]), and cough medicine if needed (in hardcopy)  Please take all new medication as prescribed - the dexilant 60 mg (in hardcopy) if not too expensive  Please also consider taking OTC Zyrtec 10 mg to see if improves cough (for allergies)  Please continue all other medications as before, and refills have been done if requested.  Please have the pharmacy call with any other refills you may need.  Please keep your appointments with your specialists as you may have planned  Please go to the XRAY Department in the Basement (go straight as you get off the elevator) for the x-ray testing - at your convenience  You will be contacted by phone if any changes need to be made immediately.  Otherwise, you will receive a letter about your results with an explanation, but please check with MyChart first.  Please remember to sign up for MyChart if you have not done so, as this will be important to you in the future with finding out test results, communicating by private email, and scheduling acute appointments online when needed.

## 2016-03-18 NOTE — Progress Notes (Signed)
Subjective:    Patient ID: Jared Alvarez, male    DOB: [birth year], 65 y.o.   MRN: [id number]  HPI  Here with 1 mo non prod cough, may have had a low grade temp but not sure, Also with recurring headaches and fatigue, inhaler no help, no obvious allergies, no prior known pulm issue such as asthma, no recent travel or pets except a cat he's had for years, but does have mild uncontrolled GERD/reflux symptoms despite bid protonix,, also with an episode dull SSCP severe a few nights ago for 15 min suggestive of esoph spasm.  Pt denies other chest pain, orthopnea, PND, increased LE swelling, palpitations, dizziness or syncope. Stress test neg oct 2017 Past Medical History:  Diagnosis Date  . Allergy   . Anxiety    no per pt  . Coronary artery disease   . Depression    no per pt  . DM type 2 (diabetes mellitus, type 2) ([place])   . Gout   . Hearing loss   . Hyperlipidemia   . Hypertension   . Hypogonadism male   . Inguinal hernia    right  . LBP (low back pain)   . Leg cramps   . Osteoarthritis   . Skin neoplasm   . Tinnitus   . Vertigo   . Vitamin B12 deficiency    Past Surgical History:  Procedure Laterality Date  . heart stent     in 1997  . INGUINAL HERNIA REPAIR      reports that he has never smoked. He has never used smokeless tobacco. He reports that he drinks about 4.2 oz of alcohol per week . He reports that he does not use drugs. family history includes Arthritis in his sister; Coronary artery disease in his other; Heart disease in his brother and father; Hypertension in his other; Lupus in his sister. Allergies  Allergen Reactions  . Atorvastatin     REACTION: pains  . Bupropion Hcl     REACTION: side effects  . Duloxetine     REACTION: dizzy  . Fluoxetine Hcl     REACTION: withdrawl  . Nexium [Esomeprazole Magnesium]     Rash on legs  . Simvastatin     REACTION: pain  . Venlafaxine     REACTION: side effects  . Viibryd [Vilazodone Hcl]     nightmares    Current Outpatient Prescriptions on File Prior to Visit  Medication Sig Dispense Refill  . albuterol (PROVENTIL HFA;VENTOLIN HFA) 108 (90 Base) MCG/ACT inhaler Inhale 2 puffs into the lungs every 6 (six) hours as needed for wheezing or shortness of breath. 1 Inhaler 2  . amLODipine (NORVASC) 5 MG tablet Take 1 tablet (5 mg total) by mouth daily. 90 tablet 3  . Ascorbic Acid (VITAMIN C) 1000 MG tablet Take 1,000 mg by mouth daily.      [name] aspirin 81 MG tablet Take 81 mg by mouth daily.      . Cholecalciferol (VITAMIN D3) 2000 units capsule Take 1 capsule (2,000 Units total) by mouth daily. 100 capsule 3  . colchicine 0.6 MG tablet Take 1 tablet (0.6 mg total) by mouth as needed. If patient has flare up with gout 30 tablet 5  . CRESTOR 40 MG tablet TAKE 1 TABLET ONCE DAILY. 90 tablet 0  . Cyanocobalamin (VITAMIN B-12) 500 MCG SUBL Place 1 tablet under the tongue daily.    [name] FLUoxetine (PROZAC) 40 MG capsule Take 40 mg by mouth daily.  1  .  Linaclotide (LINZESS) 290 MCG CAPS capsule Take 1 capsule by mouth daily as needed (consipation/IBS).     . LORazepam (ATIVAN) 1 MG tablet TAKE (1) TABLET TWICE A DAY AS NEEDED. 180 tablet 1  . metFORMIN (GLUCOPHAGE) 1000 MG tablet Take 1,000 mg by mouth 2 (two) times daily.  1  . pantoprazole (PROTONIX) 40 MG tablet Take 40 mg by mouth 2 (two) times daily.  1  . tadalafil (CIALIS) 20 MG tablet Take 20 mg by mouth daily as needed for erectile dysfunction.    . traMADol (ULTRAM) 50 MG tablet TAKE 1 OR 2 TABLETS FOUR TIMES DAILY AS NEEDED FOR PAIN. 100 tablet 2  . valsartan (DIOVAN) 320 MG tablet Take 320 mg by mouth daily.  1   No current facility-administered medications on file prior to visit.    Review of Systems  Constitutional: Negative for unusual diaphoresis or night sweats HENT: Negative for ear swelling or discharge Eyes: Negative for worsening visual haziness  Respiratory: Negative for choking and stridor.   Gastrointestinal: Negative for  distension or worsening eructation Genitourinary: Negative for retention or change in urine volume.  Musculoskeletal: Negative for other MSK pain or swelling Skin: Negative for color change and worsening wound Neurological: Negative for tremors and numbness other than noted  Psychiatric/Behavioral: Negative for decreased concentration or agitation other than above   All other system neg per pt    Objective:   Physical Exam BP (!) 150/78   Pulse 78   Temp 98.7 F (37.1 C) (Oral)   Resp 16   Wt 186 lb (84.4 kg)   SpO2 97%   BMI 28.28 kg/m  VS noted,  Constitutional: Pt appears in no apparent distress HENT: Head: NCAT.  Right Ear: External ear normal.  Left Ear: External ear normal.  Bilat tm's with mild erythema.  Max sinus areas non tender.  Pharynx with mild erythema, no exudate, no swelling Eyes: . Pupils are equal, round, and reactive to light. Conjunctivae and EOM are normal Neck: Normal range of motion. Neck supple. No lymphadenopathy Cardiovascular: Normal rate and regular rhythm.   Pulmonary/Chest: Effort normal and breath sounds without rales or wheezing.  Neurological: Pt is alert. Not confused , motor grossly intact Skin: Skin is warm. No rash, no LE edema Psychiatric: Pt behavior is normal. No agitation.     Assessment & Plan:

## 2016-03-18 NOTE — Assessment & Plan Note (Signed)
Diff includes URI, allergy related, asthma or other pulm process, or uncontrolled gerd.  For antibx, zyrtec daily, ocugh med prn, cxr r/o pulm process. And change protonix bid to dexilant 60 qd, f/u PCP 1-2 wks if not improved

## 2016-03-24 ENCOUNTER — Telehealth: Payer: Self-pay | Admitting: Internal Medicine

## 2016-03-24 NOTE — Telephone Encounter (Signed)
Patient is staying he is still not feeling better. His ear is hurting. He is out of town and wants you to call him another abx in somewhere. He stated the doctor told him if he wasn't no better to call back. Please follow up with patient, Thank you.

## 2016-03-24 NOTE — Telephone Encounter (Signed)
The pt took a strong abx: f he is not better he should see a doctor ASAP Thx

## 2016-03-25 NOTE — Telephone Encounter (Signed)
Left detailed mess informing pt of below.  

## 2016-06-02 ENCOUNTER — Other Ambulatory Visit (INDEPENDENT_AMBULATORY_CARE_PROVIDER_SITE_OTHER): Payer: BLUE CROSS/BLUE SHIELD

## 2016-06-02 ENCOUNTER — Ambulatory Visit (INDEPENDENT_AMBULATORY_CARE_PROVIDER_SITE_OTHER): Payer: BLUE CROSS/BLUE SHIELD | Admitting: Internal Medicine

## 2016-06-02 ENCOUNTER — Encounter: Payer: Self-pay | Admitting: Internal Medicine

## 2016-06-02 DIAGNOSIS — E538 Deficiency of other specified B group vitamins: Secondary | ICD-10-CM | POA: Diagnosis not present

## 2016-06-02 DIAGNOSIS — F5101 Primary insomnia: Secondary | ICD-10-CM

## 2016-06-02 DIAGNOSIS — M5440 Lumbago with sciatica, unspecified side: Secondary | ICD-10-CM | POA: Diagnosis not present

## 2016-06-02 DIAGNOSIS — R0683 Snoring: Secondary | ICD-10-CM

## 2016-06-02 DIAGNOSIS — E1159 Type 2 diabetes mellitus with other circulatory complications: Secondary | ICD-10-CM | POA: Diagnosis not present

## 2016-06-02 DIAGNOSIS — G8929 Other chronic pain: Secondary | ICD-10-CM

## 2016-06-02 DIAGNOSIS — H9313 Tinnitus, bilateral: Secondary | ICD-10-CM

## 2016-06-02 DIAGNOSIS — G4700 Insomnia, unspecified: Secondary | ICD-10-CM | POA: Insufficient documentation

## 2016-06-02 LAB — BASIC METABOLIC PANEL
BUN: 15 mg/dL (ref 6–23)
CHLORIDE: 101 meq/L (ref 96–112)
CO2: 32 mEq/L (ref 19–32)
CREATININE: 0.83 mg/dL (ref 0.40–1.50)
Calcium: 10 mg/dL (ref 8.4–10.5)
GFR: 98.64 mL/min (ref >60.00)
Glucose, Bld: 147 mg/dL — ABNORMAL HIGH (ref 70–99)
POTASSIUM: 4.1 meq/L (ref 3.5–5.1)
Sodium: 140 mEq/L (ref 135–145)

## 2016-06-02 LAB — HEPATIC FUNCTION PANEL
ALBUMIN: 4.9 g/dL (ref 3.5–5.2)
ALT: 14 U/L (ref 0–53)
AST: 17 U/L (ref 0–37)
Alkaline Phosphatase: 94 U/L (ref 39–117)
BILIRUBIN TOTAL: 0.6 mg/dL (ref 0.2–1.2)
Bilirubin, Direct: 0.2 mg/dL (ref 0.0–0.3)
Total Protein: 7.3 g/dL (ref 6.0–8.3)

## 2016-06-02 LAB — TSH: TSH: 1.51 u[IU]/mL (ref 0.35–4.50)

## 2016-06-02 LAB — HEMOGLOBIN A1C: HEMOGLOBIN A1C: 6.8 % — AB (ref 4.6–6.5)

## 2016-06-02 MED ORDER — SUVOREXANT 15 MG PO TABS: 15.0000 mg | ORAL_TABLET | Freq: Every evening | ORAL | 5 refills | Status: DC | PRN

## 2016-06-02 NOTE — Assessment & Plan Note (Signed)
On B12 

## 2016-06-02 NOTE — Progress Notes (Signed)
Pre visit review using our clinic review tool, if applicable. No additional management support is needed unless otherwise documented below in the visit note. 

## 2016-06-02 NOTE — Assessment & Plan Note (Signed)
Labs

## 2016-06-02 NOTE — Assessment & Plan Note (Signed)
Dr Constance Holster

## 2016-06-02 NOTE — Assessment & Plan Note (Signed)
Using Tramadol very little

## 2016-06-02 NOTE — Patient Instructions (Signed)
Try TRE - Trauma Release Exercise 

## 2016-06-02 NOTE — Assessment & Plan Note (Signed)
Dr Brett Fairy

## 2016-06-02 NOTE — Progress Notes (Signed)
Subjective:  Patient ID: Jared Alvarez, male    DOB: 1950-09-05  Age: 65 y.o. MRN: [id number]  CC: No chief complaint on file.   HPI Jared Alvarez presents for a c/o memory loss and agitation. C/o tinnitus. F/u gout, DM, HTN, depression f/u.  Outpatient Medications Prior to Visit  Medication Sig Dispense Refill  . amLODipine (NORVASC) 5 MG tablet Take 1 tablet (5 mg total) by mouth daily. 90 tablet 3  . Ascorbic Acid (VITAMIN C) 1000 MG tablet Take 1,000 mg by mouth daily.      [name] aspirin 81 MG tablet Take 81 mg by mouth daily.      . Cholecalciferol (VITAMIN D3) 2000 units capsule Take 1 capsule (2,000 Units total) by mouth daily. 100 capsule 3  . colchicine 0.6 MG tablet Take 1 tablet (0.6 mg total) by mouth as needed. If patient has flare up with gout 30 tablet 5  . CRESTOR 40 MG tablet TAKE 1 TABLET ONCE DAILY. 90 tablet 0  . Cyanocobalamin (VITAMIN B-12) 500 MCG SUBL Place 1 tablet under the tongue daily.    [name] FLUoxetine (PROZAC) 40 MG capsule Take 40 mg by mouth daily.  1  . LORazepam (ATIVAN) 1 MG tablet TAKE (1) TABLET TWICE A DAY AS NEEDED. 180 tablet 1  . metFORMIN (GLUCOPHAGE) 1000 MG tablet Take 1,000 mg by mouth 2 (two) times daily.  1  . pantoprazole (PROTONIX) 40 MG tablet Take 40 mg by mouth 2 (two) times daily.  1  . valsartan (DIOVAN) 320 MG tablet Take 320 mg by mouth daily.  1  . albuterol (PROVENTIL HFA;VENTOLIN HFA) 108 (90 Base) MCG/ACT inhaler Inhale 2 puffs into the lungs every 6 (six) hours as needed for wheezing or shortness of breath. (Patient not taking: Reported on 06/02/2016) 1 Inhaler 2  . dexlansoprazole (DEXILANT) 60 MG capsule Take 1 capsule (60 mg total) by mouth daily. (Patient not taking: Reported on 06/02/2016) 30 capsule 11  . Linaclotide (LINZESS) 290 MCG CAPS capsule Take 1 capsule by mouth daily as needed (consipation/IBS).     [name] tadalafil (CIALIS) 20 MG tablet Take 20 mg by mouth daily as needed for erectile dysfunction.    . traMADol  (ULTRAM) 50 MG tablet TAKE 1 OR 2 TABLETS FOUR TIMES DAILY AS NEEDED FOR PAIN. (Patient not taking: Reported on 06/02/2016) 100 tablet 2   No facility-administered medications prior to visit.     ROS Review of Systems  Constitutional: Negative for appetite change, fatigue and unexpected weight change.  HENT: Positive for hearing loss and tinnitus. Negative for congestion, nosebleeds, sneezing, sore throat and trouble swallowing.   Eyes: Negative for itching and visual disturbance.  Respiratory: Negative for cough.   Cardiovascular: Negative for chest pain, palpitations and leg swelling.  Gastrointestinal: Negative for abdominal distention, blood in stool, diarrhea and nausea.  Genitourinary: Negative for frequency and hematuria.  Musculoskeletal: Negative for back pain, gait problem, joint swelling and neck pain.  Skin: Negative for rash.  Neurological: Negative for dizziness, tremors, speech difficulty and weakness.  Psychiatric/Behavioral: Positive for decreased concentration and sleep disturbance. Negative for agitation, dysphoric mood and suicidal ideas. The patient is nervous/anxious.     Objective:  BP (!) 162/82   Pulse (!) 53   Temp 98.2 F (36.8 C) (Oral)   Resp 16   Ht 5\' 7"  (1.702 m)   Wt 183 lb 12.8 oz (83.4 kg)   SpO2 94%   BMI 28.79 kg/m   BP Readings  from Last 3 Encounters:  06/02/16 (!) 162/82  03/18/16 (!) 150/78  12/11/15 (!) 155/85    Wt Readings from Last 3 Encounters:  06/02/16 183 lb 12.8 oz (83.4 kg)  03/18/16 186 lb (84.4 kg)  02/04/16 182 lb (82.6 kg)    Physical Exam  Constitutional: He is oriented to person, place, and time. He appears well-developed. No distress.  NAD  HENT:  Mouth/Throat: Oropharynx is clear and moist.  Eyes: Conjunctivae are normal. Pupils are equal, round, and reactive to light.  Neck: Normal range of motion. No JVD present. No thyromegaly present.  Cardiovascular: Normal rate, regular rhythm, normal heart sounds and  intact distal pulses.  Exam reveals no gallop and no friction rub.   No murmur heard. Pulmonary/Chest: Effort normal and breath sounds normal. No respiratory distress. He has no wheezes. He has no rales. He exhibits no tenderness.  Abdominal: Soft. Bowel sounds are normal. He exhibits no distension and no mass. There is no tenderness. There is no rebound and no guarding.  Musculoskeletal: Normal range of motion. He exhibits no edema or tenderness.  Lymphadenopathy:    He has no cervical adenopathy.  Neurological: He is alert and oriented to person, place, and time. He has normal reflexes. No cranial nerve deficit. He exhibits normal muscle tone. He displays a negative Romberg sign. Coordination and gait normal.  Skin: Skin is warm and dry. No rash noted.  Psychiatric: He has a normal mood and affect. His behavior is normal. Judgment and thought content normal.    Lab Results  Component Value Date   WBC 4.2 08/28/2015   HGB 13.2 08/28/2015   HCT 38.2 (L) 08/28/2015   PLT 224.0 08/28/2015   GLUCOSE 156 (H) 08/28/2015   CHOL 157 08/28/2015   TRIG 46.0 08/28/2015   HDL 57.80 08/28/2015   LDLDIRECT 149.6 12/15/2011   LDLCALC 90 08/28/2015   ALT 13 08/28/2015   AST 13 08/28/2015   NA 141 08/28/2015   K 4.8 08/28/2015   CL 103 08/28/2015   CREATININE 0.80 08/28/2015   BUN 14 08/28/2015   CO2 31 08/28/2015   TSH 1.27 08/28/2015   PSA 0.50 08/28/2015   INR 0.98 06/03/2010   HGBA1C 6.7 (H) 08/28/2015    No results found.  Assessment & Plan:   There are no diagnoses linked to this encounter. I am having Mr. Colin maintain his aspirin, vitamin C, colchicine, linaclotide, Vitamin B-12, albuterol, Vitamin D3, FLUoxetine, metFORMIN, pantoprazole, valsartan, tadalafil, amLODipine, traMADol, CRESTOR, LORazepam, and dexlansoprazole.  No orders of the defined types were placed in this encounter.    Follow-up: No Follow-up on file.  Walker Kehr, MD

## 2016-06-02 NOTE — Assessment & Plan Note (Signed)
Trial of Belsomra Neurol ref Dr Brett Fairy

## 2016-06-03 ENCOUNTER — Ambulatory Visit (INDEPENDENT_AMBULATORY_CARE_PROVIDER_SITE_OTHER): Payer: BLUE CROSS/BLUE SHIELD | Admitting: Psychology

## 2016-06-03 DIAGNOSIS — F341 Dysthymic disorder: Secondary | ICD-10-CM | POA: Diagnosis not present

## 2016-06-03 DIAGNOSIS — F411 Generalized anxiety disorder: Secondary | ICD-10-CM | POA: Diagnosis not present

## 2016-06-11 DIAGNOSIS — H903 Sensorineural hearing loss, bilateral: Secondary | ICD-10-CM | POA: Insufficient documentation

## 2016-06-19 ENCOUNTER — Ambulatory Visit (INDEPENDENT_AMBULATORY_CARE_PROVIDER_SITE_OTHER): Payer: BLUE CROSS/BLUE SHIELD | Admitting: Psychology

## 2016-06-19 DIAGNOSIS — F341 Dysthymic disorder: Secondary | ICD-10-CM

## 2016-06-19 DIAGNOSIS — F411 Generalized anxiety disorder: Secondary | ICD-10-CM

## 2016-06-20 ENCOUNTER — Encounter: Payer: Self-pay | Admitting: Adult Health

## 2016-06-20 ENCOUNTER — Ambulatory Visit (INDEPENDENT_AMBULATORY_CARE_PROVIDER_SITE_OTHER): Payer: BLUE CROSS/BLUE SHIELD | Admitting: Adult Health

## 2016-06-20 VITALS — BP 110/72 | HR 72 | Temp 97.8°F | Ht 67.0 in | Wt 184.0 lb

## 2016-06-20 DIAGNOSIS — R6889 Other general symptoms and signs: Secondary | ICD-10-CM | POA: Diagnosis not present

## 2016-06-20 LAB — POCT INFLUENZA A/B
INFLUENZA A, POC: NEGATIVE
INFLUENZA B, POC: NEGATIVE

## 2016-06-20 MED ORDER — OSELTAMIVIR PHOSPHATE 75 MG PO CAPS: 75.0000 mg | ORAL_CAPSULE | Freq: Every day | ORAL | 0 refills | Status: DC

## 2016-06-20 NOTE — Progress Notes (Signed)
Subjective:    Patient ID: Jared Alvarez, male    DOB: 1950-08-16, 66 y.o.   MRN: [id number]  HPI  66 year old male who  has a past medical history of Allergy; Anxiety; Coronary artery disease; Depression; DM type 2 (diabetes mellitus, type 2) ([place]); Gout; Hearing loss; Hyperlipidemia; Hypertension; Hypogonadism male; Inguinal hernia; LBP (low back pain); Leg cramps; Osteoarthritis; Skin neoplasm; Tinnitus; Vertigo; and Vitamin B12 deficiency.   He presents to Saturday clinic for flu like symptoms. His symptoms have includes that of generalized body, fatigue, chest congestion, non productive cough, upset stomach, and subjective fever  He has has his symptoms for 2 days. His wife was diagnosed with flu and has been on Tamiflu.   He denies any vomiting or diarrhea.    Review of Systems  All other systems reviewed and are negative.  See HPI   Past Medical History:  Diagnosis Date  . Allergy   . Anxiety    no per pt  . Coronary artery disease   . Depression    no per pt  . DM type 2 (diabetes mellitus, type 2) ([place])   . Gout   . Hearing loss   . Hyperlipidemia   . Hypertension   . Hypogonadism male   . Inguinal hernia    right  . LBP (low back pain)   . Leg cramps   . Osteoarthritis   . Skin neoplasm   . Tinnitus   . Vertigo   . Vitamin B12 deficiency     Social History   Social History  . Marital status: Married    Spouse name: N/A  . Number of children: N/A  . Years of education: N/A   Occupational History  . [employer]   Social History Main Topics  . Smoking status: Never Smoker  . Smokeless tobacco: Never Used  . Alcohol use 4.2 oz/week    7 Glasses of wine per week  . Drug use: No  . Sexual activity: Yes   Other Topics Concern  . Not on file   Social History Narrative  . No narrative on file    Past Surgical History:  Procedure Laterality Date  . heart stent     in 1997  . INGUINAL HERNIA REPAIR        Family History  Problem Relation Age of Onset  . Heart disease Father   . Arthritis Sister   . Heart disease Brother   . Coronary artery disease Other   . Hypertension Other   . Lupus Sister   . Colon cancer Neg Hx   . Esophageal cancer Neg Hx   . Stomach cancer Neg Hx   . Rectal cancer Neg Hx     Allergies  Allergen Reactions  . Atorvastatin     REACTION: pains  . Bupropion Hcl     REACTION: side effects  . Duloxetine     REACTION: dizzy  . Fluoxetine Hcl     REACTION: withdrawl  . Nexium [Esomeprazole Magnesium]     Rash on legs  . Simvastatin     REACTION: pain  . Venlafaxine     REACTION: side effects  . Viibryd [Vilazodone Hcl]     nightmares    Current Outpatient Prescriptions on File Prior to Visit  Medication Sig Dispense Refill  . albuterol (PROVENTIL HFA;VENTOLIN HFA) 108 (90 Base) MCG/ACT inhaler Inhale 2 puffs into the lungs every 6 (six) hours as needed for  wheezing or shortness of breath. 1 Inhaler 2  . amLODipine (NORVASC) 5 MG tablet Take 1 tablet (5 mg total) by mouth daily. 90 tablet 3  . Ascorbic Acid (VITAMIN C) 1000 MG tablet Take 1,000 mg by mouth daily.      [name] aspirin 81 MG tablet Take 81 mg by mouth daily.      . Cholecalciferol (VITAMIN D3) 2000 units capsule Take 1 capsule (2,000 Units total) by mouth daily. 100 capsule 3  . colchicine 0.6 MG tablet Take 1 tablet (0.6 mg total) by mouth as needed. If patient has flare up with gout 30 tablet 5  . CRESTOR 40 MG tablet TAKE 1 TABLET ONCE DAILY. 90 tablet 0  . Cyanocobalamin (VITAMIN B-12) 500 MCG SUBL Place 1 tablet under the tongue daily.    [name] dexlansoprazole (DEXILANT) 60 MG capsule Take 1 capsule (60 mg total) by mouth daily. 30 capsule 11  . FLUoxetine (PROZAC) 40 MG capsule Take 40 mg by mouth daily.  1  . Linaclotide (LINZESS) 290 MCG CAPS capsule Take 1 capsule by mouth daily as needed (consipation/IBS).     . LORazepam (ATIVAN) 1 MG tablet TAKE (1) TABLET TWICE A DAY AS NEEDED. 180  tablet 1  . metFORMIN (GLUCOPHAGE) 1000 MG tablet Take 1,000 mg by mouth 2 (two) times daily.  1  . pantoprazole (PROTONIX) 40 MG tablet Take 40 mg by mouth 2 (two) times daily.  1  . Suvorexant (BELSOMRA) 15 MG TABS Take 15 mg by mouth at bedtime as needed. 30 tablet 5  . tadalafil (CIALIS) 20 MG tablet Take 20 mg by mouth daily as needed for erectile dysfunction.    . traMADol (ULTRAM) 50 MG tablet TAKE 1 OR 2 TABLETS FOUR TIMES DAILY AS NEEDED FOR PAIN. 100 tablet 2  . valsartan (DIOVAN) 320 MG tablet Take 320 mg by mouth daily.  1   No current facility-administered medications on file prior to visit.     BP 110/72 (BP Location: Left Arm, Patient Position: Sitting, Cuff Size: Large)   Pulse 72   Temp 97.8 F (36.6 C) (Oral)   Ht 5\' 7"  (1.702 m)   Wt 184 lb (83.5 kg)   SpO2 98%   BMI 28.82 kg/m        Objective:   Physical Exam  Constitutional: He is oriented to person, place, and time. He appears well-developed and well-nourished. No distress.  Neck: Normal range of motion. Neck supple.  Cardiovascular: Normal rate, regular rhythm, normal heart sounds and intact distal pulses.  Exam reveals no gallop and no friction rub.   No murmur heard. Pulmonary/Chest: Effort normal and breath sounds normal. No respiratory distress. He has no wheezes. He has no rales. He exhibits no tenderness.  Lymphadenopathy:    He has cervical adenopathy.  Neurological: He is alert and oriented to person, place, and time.  Skin: Skin is warm and dry. No rash noted. He is not diaphoretic. No erythema. No pallor.  Psychiatric: He has a normal mood and affect. His behavior is normal. Judgment and thought content normal.  Nursing note and vitals reviewed.     Assessment & Plan:  1. Flu-like symptoms - POC Influenza A/B- negative  - oseltamivir (TAMIFLU) 75 MG capsule; Take 1 capsule (75 mg total) by mouth daily.  Dispense: 10 capsule; Refill: 0 - Will treat due to exposure and he will be traveling  this week.  - Advised hydration, tylenol and motrin  - Follow up with PCP  Dorothyann Peng, NP

## 2016-07-07 ENCOUNTER — Ambulatory Visit (INDEPENDENT_AMBULATORY_CARE_PROVIDER_SITE_OTHER): Payer: BLUE CROSS/BLUE SHIELD | Admitting: Psychology

## 2016-07-07 DIAGNOSIS — F341 Dysthymic disorder: Secondary | ICD-10-CM | POA: Diagnosis not present

## 2016-07-07 DIAGNOSIS — F411 Generalized anxiety disorder: Secondary | ICD-10-CM

## 2016-07-15 ENCOUNTER — Institutional Professional Consult (permissible substitution): Payer: BLUE CROSS/BLUE SHIELD | Admitting: Neurology

## 2016-08-14 ENCOUNTER — Ambulatory Visit (INDEPENDENT_AMBULATORY_CARE_PROVIDER_SITE_OTHER): Payer: BLUE CROSS/BLUE SHIELD | Admitting: Psychology

## 2016-08-14 DIAGNOSIS — F341 Dysthymic disorder: Secondary | ICD-10-CM

## 2016-08-14 DIAGNOSIS — F411 Generalized anxiety disorder: Secondary | ICD-10-CM

## 2016-08-18 ENCOUNTER — Other Ambulatory Visit: Payer: Self-pay | Admitting: Internal Medicine

## 2016-08-18 MED ORDER — LORAZEPAM 1 MG PO TABS: ORAL_TABLET | ORAL | 0 refills | Status: DC

## 2016-08-18 NOTE — Telephone Encounter (Signed)
Called refill into [facility] left on pharmacy vm...Jared Alvarez

## 2016-08-18 NOTE — Telephone Encounter (Signed)
Ok to ref [employer]

## 2016-08-24 ENCOUNTER — Ambulatory Visit: Payer: BLUE CROSS/BLUE SHIELD | Admitting: Psychology

## 2016-09-10 ENCOUNTER — Ambulatory Visit (INDEPENDENT_AMBULATORY_CARE_PROVIDER_SITE_OTHER): Payer: Medicare Other | Admitting: Psychology

## 2016-09-10 DIAGNOSIS — F341 Dysthymic disorder: Secondary | ICD-10-CM | POA: Diagnosis not present

## 2016-09-10 DIAGNOSIS — F411 Generalized anxiety disorder: Secondary | ICD-10-CM | POA: Diagnosis not present

## 2016-09-21 ENCOUNTER — Ambulatory Visit (INDEPENDENT_AMBULATORY_CARE_PROVIDER_SITE_OTHER): Payer: Medicare Other | Admitting: Internal Medicine

## 2016-09-21 ENCOUNTER — Encounter: Payer: Self-pay | Admitting: Internal Medicine

## 2016-09-21 VITALS — BP 120/78 | HR 70 | Temp 97.8°F | Ht 67.0 in | Wt 179.1 lb

## 2016-09-21 DIAGNOSIS — E1159 Type 2 diabetes mellitus with other circulatory complications: Secondary | ICD-10-CM | POA: Diagnosis not present

## 2016-09-21 DIAGNOSIS — M79671 Pain in right foot: Secondary | ICD-10-CM

## 2016-09-21 DIAGNOSIS — E785 Hyperlipidemia, unspecified: Secondary | ICD-10-CM

## 2016-09-21 DIAGNOSIS — N320 Bladder-neck obstruction: Secondary | ICD-10-CM | POA: Diagnosis not present

## 2016-09-21 DIAGNOSIS — M1000 Idiopathic gout, unspecified site: Secondary | ICD-10-CM

## 2016-09-21 DIAGNOSIS — E538 Deficiency of other specified B group vitamins: Secondary | ICD-10-CM | POA: Diagnosis not present

## 2016-09-21 MED ORDER — TADALAFIL 20 MG PO TABS: 20.0000 mg | ORAL_TABLET | Freq: Every day | ORAL | 5 refills | Status: DC | PRN

## 2016-09-21 MED ORDER — AMLODIPINE BESYLATE 5 MG PO TABS: 5.0000 mg | ORAL_TABLET | Freq: Every day | ORAL | 3 refills | Status: DC

## 2016-09-21 MED ORDER — COLCHICINE 0.6 MG PO TABS: 0.6000 mg | ORAL_TABLET | ORAL | 5 refills | Status: DC | PRN

## 2016-09-21 NOTE — Assessment & Plan Note (Signed)
On B12 

## 2016-09-21 NOTE — Assessment & Plan Note (Signed)
Labs Metformin 

## 2016-09-21 NOTE — Progress Notes (Signed)
Subjective:  Patient ID: Jared Alvarez, male    DOB: [birth year]  Age: 66 y.o. MRN: [id number]  CC: No chief complaint on file.   HPI Jared Alvarez presents for R ear membrane wiggling at times x months C/o more urination at night C/o R foot pain at times  Outpatient Medications Prior to Visit  Medication Sig Dispense Refill  . albuterol (PROVENTIL HFA;VENTOLIN HFA) 108 (90 Base) MCG/ACT inhaler Inhale 2 puffs into the lungs every 6 (six) hours as needed for wheezing or shortness of breath. 1 Inhaler 2  . amLODipine (NORVASC) 5 MG tablet Take 1 tablet (5 mg total) by mouth daily. 90 tablet 3  . Ascorbic Acid (VITAMIN C) 1000 MG tablet Take 1,000 mg by mouth daily.      [name] aspirin 81 MG tablet Take 81 mg by mouth daily.      . Cholecalciferol (VITAMIN D3) 2000 units capsule Take 1 capsule (2,000 Units total) by mouth daily. 100 capsule 3  . colchicine 0.6 MG tablet Take 1 tablet (0.6 mg total) by mouth as needed. If patient has flare up with gout 30 tablet 5  . CRESTOR 40 MG tablet TAKE 1 TABLET ONCE DAILY. 90 tablet 0  . Cyanocobalamin (VITAMIN B-12) 500 MCG SUBL Place 1 tablet under the tongue daily.    [name] dexlansoprazole (DEXILANT) 60 MG capsule Take 1 capsule (60 mg total) by mouth daily. 30 capsule 11  . FLUoxetine (PROZAC) 40 MG capsule TAKE (1) CAPSULE DAILY. 90 capsule 2  . Linaclotide (LINZESS) 290 MCG CAPS capsule Take 1 capsule by mouth daily as needed (consipation/IBS).     . LORazepam (ATIVAN) 1 MG tablet TAKE (1) TABLET TWICE A DAY AS NEEDED. 180 tablet 0  . metFORMIN (GLUCOPHAGE) 1000 MG tablet TAKE 1 TABLET TWICE DAILY. 180 tablet 2  . oseltamivir (TAMIFLU) 75 MG capsule Take 1 capsule (75 mg total) by mouth daily. 10 capsule 0  . pantoprazole (PROTONIX) 40 MG tablet TAKE 1 TABLET TWICE DAILY. 180 tablet 2  . Suvorexant (BELSOMRA) 15 MG TABS Take 15 mg by mouth at bedtime as needed. 30 tablet 5  . tadalafil (CIALIS) 20 MG tablet Take 20 mg by mouth daily as needed for  erectile dysfunction.    . traMADol (ULTRAM) 50 MG tablet TAKE 1 OR 2 TABLETS FOUR TIMES DAILY AS NEEDED FOR PAIN. 100 tablet 2  . valsartan (DIOVAN) 320 MG tablet TAKE 1 TABLET ONCE DAILY. 90 tablet 2   No facility-administered medications prior to visit.     ROS Review of Systems  Constitutional: Negative for appetite change, fatigue and unexpected weight change.  HENT: Positive for sinus pressure. Negative for congestion, nosebleeds, sneezing, sore throat and trouble swallowing.   Eyes: Negative for itching and visual disturbance.  Respiratory: Negative for cough.   Cardiovascular: Negative for chest pain, palpitations and leg swelling.  Gastrointestinal: Negative for abdominal distention, blood in stool, diarrhea and nausea.  Genitourinary: Negative for frequency and hematuria.  Musculoskeletal: Negative for back pain, gait problem, joint swelling and neck pain.  Skin: Negative for rash.  Neurological: Negative for dizziness, tremors, speech difficulty and weakness.  Psychiatric/Behavioral: Negative for agitation, dysphoric mood and sleep disturbance. The patient is not nervous/anxious.     Objective:  BP 120/78 (BP Location: Left Arm, Patient Position: Sitting, Cuff Size: Normal)   Pulse 70   Temp 97.8 F (36.6 C) (Oral)   Ht 5\' 7"  (1.702 m)   Wt 179 lb 1.9 oz (81.2  kg)   SpO2 98%   BMI 28.05 kg/m   BP Readings from Last 3 Encounters:  09/21/16 120/78  06/20/16 110/72  06/02/16 (!) 162/82    Wt Readings from Last 3 Encounters:  09/21/16 179 lb 1.9 oz (81.2 kg)  06/20/16 184 lb (83.5 kg)  06/02/16 183 lb 12.8 oz (83.4 kg)    Physical Exam  Constitutional: He is oriented to person, place, and time. He appears well-developed. No distress.  NAD  HENT:  Mouth/Throat: Oropharynx is clear and moist.  Eyes: Conjunctivae are normal. Pupils are equal, round, and reactive to light.  Neck: Normal range of motion. No JVD present. No thyromegaly present.  Cardiovascular:  Normal rate, regular rhythm, normal heart sounds and intact distal pulses.  Exam reveals no gallop and no friction rub.   No murmur heard. Pulmonary/Chest: Effort normal and breath sounds normal. No respiratory distress. He has no wheezes. He has no rales. He exhibits no tenderness.  Abdominal: Soft. Bowel sounds are normal. He exhibits no distension and no mass. There is no tenderness. There is no rebound and no guarding.  Musculoskeletal: Normal range of motion. He exhibits no edema or tenderness.  Lymphadenopathy:    He has no cervical adenopathy.  Neurological: He is alert and oriented to person, place, and time. He has normal reflexes. No cranial nerve deficit. He exhibits normal muscle tone. He displays a negative Romberg sign. Coordination and gait normal.  Skin: Skin is warm and dry. No rash noted.  Psychiatric: He has a normal mood and affect. His behavior is normal. Judgment and thought content normal.  R posterior tibial tendon was painful to palpation  Lab Results  Component Value Date   WBC 4.2 08/28/2015   HGB 13.2 08/28/2015   HCT 38.2 (L) 08/28/2015   PLT 224.0 08/28/2015   GLUCOSE 147 (H) 06/02/2016   CHOL 157 08/28/2015   TRIG 46.0 08/28/2015   HDL 57.80 08/28/2015   LDLDIRECT 149.6 12/15/2011   LDLCALC 90 08/28/2015   ALT 14 06/02/2016   AST 17 06/02/2016   NA 140 06/02/2016   K 4.1 06/02/2016   CL 101 06/02/2016   CREATININE 0.83 06/02/2016   BUN 15 06/02/2016   CO2 32 06/02/2016   TSH 1.51 06/02/2016   PSA 0.50 08/28/2015   INR 0.98 06/03/2010   HGBA1C 6.8 (H) 06/02/2016    No results found.  Assessment & Plan:   There are no diagnoses linked to this encounter. I am having Mr. Wolfley maintain his aspirin, vitamin C, colchicine, linaclotide, Vitamin B-12, albuterol, Vitamin D3, tadalafil, amLODipine, traMADol, CRESTOR, dexlansoprazole, Suvorexant, oseltamivir, valsartan, pantoprazole, FLUoxetine, metFORMIN, and LORazepam.  No orders of the defined  types were placed in this encounter.    Follow-up: No Follow-up on file.  Walker Kehr, MD

## 2016-09-21 NOTE — Patient Instructions (Signed)
Posterior tibial tendonitis

## 2016-09-21 NOTE — Assessment & Plan Note (Signed)
ASA, Crestor Labs

## 2016-09-21 NOTE — Assessment & Plan Note (Signed)
Stretch ankle; ice prn Ibuprofen prn

## 2016-09-21 NOTE — Assessment & Plan Note (Signed)
Colchicine

## 2016-09-22 ENCOUNTER — Other Ambulatory Visit (INDEPENDENT_AMBULATORY_CARE_PROVIDER_SITE_OTHER): Payer: Medicare Other

## 2016-09-22 ENCOUNTER — Telehealth: Payer: Self-pay | Admitting: Internal Medicine

## 2016-09-22 DIAGNOSIS — E1159 Type 2 diabetes mellitus with other circulatory complications: Secondary | ICD-10-CM

## 2016-09-22 DIAGNOSIS — N320 Bladder-neck obstruction: Secondary | ICD-10-CM

## 2016-09-22 DIAGNOSIS — M1000 Idiopathic gout, unspecified site: Secondary | ICD-10-CM

## 2016-09-22 DIAGNOSIS — E785 Hyperlipidemia, unspecified: Secondary | ICD-10-CM

## 2016-09-22 DIAGNOSIS — E538 Deficiency of other specified B group vitamins: Secondary | ICD-10-CM | POA: Diagnosis not present

## 2016-09-22 LAB — HEPATIC FUNCTION PANEL
ALK PHOS: 89 U/L (ref 39–117)
ALT: 12 U/L (ref 0–53)
AST: 14 U/L (ref 0–37)
Albumin: 4.5 g/dL (ref 3.5–5.2)
BILIRUBIN TOTAL: 0.5 mg/dL (ref 0.2–1.2)
Bilirubin, Direct: 0.1 mg/dL (ref 0.0–0.3)
Total Protein: 6.7 g/dL (ref 6.0–8.3)

## 2016-09-22 LAB — URINALYSIS, ROUTINE W REFLEX MICROSCOPIC
BILIRUBIN URINE: NEGATIVE
Ketones, ur: NEGATIVE
Leukocytes, UA: NEGATIVE
NITRITE: NEGATIVE
Specific Gravity, Urine: 1.02 (ref 1.000–1.030)
Total Protein, Urine: NEGATIVE
UROBILINOGEN UA: 0.2 (ref 0.0–1.0)
Urine Glucose: NEGATIVE
pH: 6 (ref 5.0–8.0)

## 2016-09-22 LAB — CBC WITH DIFFERENTIAL/PLATELET
BASOS ABS: 0 10*3/uL (ref 0.0–0.1)
Basophils Relative: 0.6 % (ref 0.0–3.0)
Eosinophils Absolute: 0.1 10*3/uL (ref 0.0–0.7)
Eosinophils Relative: 2 % (ref 0.0–5.0)
HEMATOCRIT: 42.1 % (ref 39.0–52.0)
HEMOGLOBIN: 14.4 g/dL (ref 13.0–17.0)
LYMPHS PCT: 25.3 % (ref 12.0–46.0)
Lymphs Abs: 1.3 10*3/uL (ref 0.7–4.0)
MCHC: 34.2 g/dL (ref 30.0–36.0)
MCV: 93.4 fl (ref 78.0–100.0)
Monocytes Absolute: 0.7 10*3/uL (ref 0.1–1.0)
Monocytes Relative: 12.6 % — ABNORMAL HIGH (ref 3.0–12.0)
Neutro Abs: 3.1 10*3/uL (ref 1.4–7.7)
Neutrophils Relative %: 59.5 % (ref 43.0–77.0)
Platelets: 292 10*3/uL (ref 150.0–400.0)
RBC: 4.51 Mil/uL (ref 4.22–5.81)
RDW: 13.8 % (ref 11.5–15.5)
WBC: 5.2 10*3/uL (ref 4.0–10.5)

## 2016-09-22 LAB — URIC ACID: URIC ACID, SERUM: 7.1 mg/dL (ref 4.0–7.8)

## 2016-09-22 LAB — BASIC METABOLIC PANEL
BUN: 18 mg/dL (ref 6–23)
CHLORIDE: 102 meq/L (ref 96–112)
CO2: 29 mEq/L (ref 19–32)
Calcium: 9.7 mg/dL (ref 8.4–10.5)
Creatinine, Ser: 0.85 mg/dL (ref 0.40–1.50)
GFR: 95.87 mL/min (ref >60.00)
GLUCOSE: 132 mg/dL — AB (ref 70–99)
POTASSIUM: 4.3 meq/L (ref 3.5–5.1)
Sodium: 142 mEq/L (ref 135–145)

## 2016-09-22 LAB — LIPID PANEL
CHOLESTEROL: 181 mg/dL (ref 0–200)
HDL: 62.4 mg/dL (ref >39.00)
LDL CALC: 104 mg/dL — AB (ref 0–99)
NONHDL: 118.22
Total CHOL/HDL Ratio: 3
Triglycerides: 72 mg/dL (ref 0.0–149.0)
VLDL: 14.4 mg/dL (ref 0.0–40.0)

## 2016-09-22 LAB — TSH: TSH: 2.22 u[IU]/mL (ref 0.35–4.50)

## 2016-09-22 LAB — PSA: PSA: 0.64 ng/mL (ref 0.10–4.00)

## 2016-09-22 LAB — HEMOGLOBIN A1C: Hgb A1c MFr Bld: 7 % — ABNORMAL HIGH (ref 4.6–6.5)

## 2016-09-22 LAB — VITAMIN B12: Vitamin B-12: 493 pg/mL (ref 211–911)

## 2016-09-22 MED ORDER — TAMSULOSIN HCL 0.4 MG PO CAPS: 0.4000 mg | ORAL_CAPSULE | Freq: Every day | ORAL | 0 refills | Status: DC

## 2016-09-22 MED ORDER — KETOROLAC TROMETHAMINE 10 MG PO TABS: 10.0000 mg | ORAL_TABLET | Freq: Four times a day (QID) | ORAL | 0 refills | Status: DC | PRN

## 2016-09-22 NOTE — Telephone Encounter (Signed)
Pt would like to know the results of his urine test. He said that he is burning and having more pain today    [place]

## 2016-09-22 NOTE — Telephone Encounter (Signed)
There is "microscopic blood" present c/w a possible kidney stone. I'll email a Rx for Toradol as needed for pain and Flomax to use for 1-2 weeks to help the passage of the stone  Thx

## 2016-09-22 NOTE — Telephone Encounter (Signed)
Results are in, please advise

## 2016-09-23 MED ORDER — SILDENAFIL CITRATE 20 MG PO TABS: ORAL_TABLET | ORAL | 5 refills | Status: DC

## 2016-09-23 NOTE — Telephone Encounter (Signed)
Ok Thx 

## 2016-09-23 NOTE — Addendum Note (Signed)
Addended by: Karren Cobble on: 09/23/2016 05:14 PM   Modules accepted: Orders

## 2016-09-23 NOTE — Telephone Encounter (Signed)
Pt notified.  Patient stated the [place] was not coved by his insurance and the pharmacist recommended sildenafil. He wants to get it at [facility]. Please advise.

## 2016-09-23 NOTE — Telephone Encounter (Signed)
Pt notified, RX sent 

## 2016-09-24 ENCOUNTER — Telehealth: Payer: Self-pay | Admitting: Internal Medicine

## 2016-09-24 MED ORDER — SILDENAFIL CITRATE 20 MG PO TABS: ORAL_TABLET | ORAL | 5 refills | Status: DC

## 2016-09-24 NOTE — Telephone Encounter (Signed)
Lease advise.   RX faxed

## 2016-09-24 NOTE — Telephone Encounter (Signed)
Pt states he had a reaction to tamsulosin (FLOMAX) 0.4 MG CAPS capsule He took it last night and woke up with chills and couldn't move and tried to go to the bathroom and couldnt stand and spent an hour on the floor. Please advise, pt did not go to work today.  Pt states he was not congested before he took the medicine and now he is.  Would also like sildenafil (REVATIO) 20 MG tablet  Resent to [facility] on [address].

## 2016-10-01 ENCOUNTER — Encounter: Payer: Self-pay | Admitting: Internal Medicine

## 2016-10-07 ENCOUNTER — Ambulatory Visit (INDEPENDENT_AMBULATORY_CARE_PROVIDER_SITE_OTHER): Payer: Medicare Other | Admitting: Psychology

## 2016-10-07 DIAGNOSIS — F341 Dysthymic disorder: Secondary | ICD-10-CM | POA: Diagnosis not present

## 2016-10-07 DIAGNOSIS — F411 Generalized anxiety disorder: Secondary | ICD-10-CM

## 2016-10-27 ENCOUNTER — Ambulatory Visit (INDEPENDENT_AMBULATORY_CARE_PROVIDER_SITE_OTHER): Payer: Medicare Other | Admitting: Psychology

## 2016-10-27 DIAGNOSIS — F341 Dysthymic disorder: Secondary | ICD-10-CM

## 2016-10-27 DIAGNOSIS — F411 Generalized anxiety disorder: Secondary | ICD-10-CM | POA: Diagnosis not present

## 2016-11-12 ENCOUNTER — Other Ambulatory Visit: Payer: Self-pay | Admitting: Internal Medicine

## 2016-11-24 ENCOUNTER — Ambulatory Visit (INDEPENDENT_AMBULATORY_CARE_PROVIDER_SITE_OTHER): Payer: Medicare Other | Admitting: Psychology

## 2016-11-24 DIAGNOSIS — F341 Dysthymic disorder: Secondary | ICD-10-CM

## 2016-11-24 DIAGNOSIS — F411 Generalized anxiety disorder: Secondary | ICD-10-CM

## 2016-11-24 NOTE — Telephone Encounter (Signed)
Pharmacy called in and pt is still looking for this refill?      Pharmacy on file [phone]

## 2016-11-24 NOTE — Telephone Encounter (Signed)
OK to fill this/these prescription(s) with additional refills x2 Thank you!  

## 2016-11-24 NOTE — Telephone Encounter (Signed)
Dr. Alain Marion pls advise on refill since 7/12. Is this ok to refill...Johny Chess

## 2016-11-25 MED ORDER — LORAZEPAM 1 MG PO TABS: ORAL_TABLET | ORAL | 0 refills | Status: DC

## 2016-11-25 NOTE — Telephone Encounter (Signed)
Called refill into [facility] spoke w/stacey can only give no more than 3 months. MD had give #180 w/1 refill. [place] to #180 only. Updated med list..../lmb

## 2016-11-25 NOTE — Addendum Note (Signed)
Addended by: Earnstine Regal on: 11/25/2016 09:06 AM   Modules accepted: Orders

## 2016-12-11 ENCOUNTER — Ambulatory Visit (INDEPENDENT_AMBULATORY_CARE_PROVIDER_SITE_OTHER): Payer: Medicare Other | Admitting: Psychology

## 2016-12-11 DIAGNOSIS — F411 Generalized anxiety disorder: Secondary | ICD-10-CM | POA: Diagnosis not present

## 2016-12-11 DIAGNOSIS — F341 Dysthymic disorder: Secondary | ICD-10-CM | POA: Diagnosis not present

## 2016-12-16 ENCOUNTER — Telehealth: Payer: Self-pay | Admitting: *Deleted

## 2016-12-16 NOTE — Telephone Encounter (Signed)
[facility] faxed request for Valsartan change. Will route to pharmacist to advise

## 2016-12-16 NOTE — Telephone Encounter (Signed)
Per the chart you have been managing this patient's Valsartan. Since you are managing we will defer to your recommendation. Thank you!

## 2016-12-17 ENCOUNTER — Telehealth: Payer: Self-pay

## 2016-12-17 MED ORDER — LOSARTAN POTASSIUM 50 MG PO TABS: 50.0000 mg | ORAL_TABLET | Freq: Every day | ORAL | 3 refills | Status: DC

## 2016-12-17 NOTE — Telephone Encounter (Signed)
Call from pharmacy to refill per Dr. Johnsie Cancel losartan 50mg  sent to pharmacy.

## 2016-12-17 NOTE — Addendum Note (Signed)
Addended by: Erskine Emery on: 12/17/2016 02:47 PM   Modules accepted: Orders

## 2016-12-17 NOTE — Telephone Encounter (Signed)
Please advise, patient is currently on Valsartan 320 mg daily. Due to recall patient is needing to be switched to another medication.

## 2016-12-17 NOTE — Telephone Encounter (Addendum)
He is on Losartan now, correct? Thanks, AP

## 2016-12-17 NOTE — Telephone Encounter (Signed)
Can change to losartan 50 mg daily

## 2016-12-18 NOTE — Telephone Encounter (Signed)
Dr. Alain Marion, Yes he has been transitioned to losartan. Thank you! Jared Alvarez

## 2016-12-21 ENCOUNTER — Ambulatory Visit: Payer: Medicare Other | Admitting: Psychology

## 2016-12-31 ENCOUNTER — Telehealth: Payer: Self-pay

## 2016-12-31 NOTE — Telephone Encounter (Signed)
PA for lorazepam denied.   Is there an alternative that can be prescribed.

## 2017-01-05 MED ORDER — ALPRAZOLAM 0.5 MG PO TBDP: 0.5000 mg | ORAL_TABLET | Freq: Two times a day (BID) | ORAL | 3 refills | Status: DC | PRN

## 2017-01-05 MED ORDER — ALPRAZOLAM 0.5 MG PO TABS: 0.5000 mg | ORAL_TABLET | Freq: Every evening | ORAL | 3 refills | Status: DC | PRN

## 2017-01-05 NOTE — Telephone Encounter (Signed)
Dissolving tablet is on the med list. Did you want dissolving or the regular tablet.

## 2017-01-05 NOTE — Telephone Encounter (Signed)
Regular please Thx

## 2017-01-05 NOTE — Telephone Encounter (Signed)
Pt informed of change

## 2017-01-05 NOTE — Telephone Encounter (Signed)
Ok Alprazolam - pls call in Thx

## 2017-01-06 NOTE — Telephone Encounter (Signed)
Printed and faxed to the pharmacy.

## 2017-01-07 ENCOUNTER — Encounter: Payer: Self-pay | Admitting: Internal Medicine

## 2017-01-12 ENCOUNTER — Ambulatory Visit (INDEPENDENT_AMBULATORY_CARE_PROVIDER_SITE_OTHER): Payer: Medicare Other | Admitting: Psychology

## 2017-01-12 DIAGNOSIS — F341 Dysthymic disorder: Secondary | ICD-10-CM

## 2017-01-12 DIAGNOSIS — F411 Generalized anxiety disorder: Secondary | ICD-10-CM | POA: Diagnosis not present

## 2017-01-12 NOTE — Progress Notes (Signed)
Patient ID: Jared Alvarez, male   DOB: 05/02/1951, 66 y.o.   MRN: [id number] Jared Alvarez is a 66 y.o. . male presents for f/u of CAD , s/p stent to the LAD in 1997 in [place]. Also has DM2, HTN, HLP Gout, depression, anxiety, hypogonadism. He established with me in 3/12. Saw PA October 2012 with chest pain and had benign myovue and echo Some stress running [employer] but otherwise doing well Taking crestor 3-4 x/week and wonders if it is affecting his memory.   Echo: 02/12/11 reviewed  Study Conclusions  - Left ventricle: The cavity size was normal. Wall thickness was increased in a pattern of mild LVH. Systolic function was normal. The estimated ejection fraction was in the range of 55% to 60%. Wall motion was normal; there were no regional wall motion abnormalities. - Aortic valve: Trivial regurgitation. - Left atrium: The atrium was mildly dilated. - Pulmonary arteries: Systolic pressure was mildly increased. PA peak pressure: 44mm Hg (S).  Some hip arthritis No angina Rare palpitations  Myovue 02/04/16 HTN response to exercise and ECG positive but normal perfusion images  Lab Results  Component Value Date   LDLCALC 104 (H) 09/22/2016    only taking about 80 mg of crestor weekly   Interval History:  Had successful folk festival last weekend. Much relief of pressure.  "Twinges" of pain lasting seconds Had PVC;s in office today asymptomatic. Compliant with meds. Labs with primary good.   ROS: Denies fever, malais, weight loss, blurry vision, decreased visual acuity, cough, sputum, SOB, hemoptysis, pleuritic pain, palpitaitons, heartburn, abdominal pain, melena, lower extremity edema, claudication, or rash.  All other systems reviewed and negative  General: BP 118/82   Pulse 73   Ht 5\' 8"  (1.727 m)   Wt 178 lb (80.7 kg)   BMI 27.06 kg/m  Affect appropriate Healthy:  appears stated age 66: normal Neck supple with no adenopathy JVP normal no bruits no  thyromegaly Lungs clear with no wheezing and good diaphragmatic motion Heart:  S1/S2 no murmur, no rub, gallop or click PMI normal Abdomen: benighn, BS positve, no tenderness, no AAA no bruit.  No HSM or HJR Distal pulses intact with no bruits No edema Neuro non-focal Skin warm and dry No muscular weakness    Current Outpatient Prescriptions  Medication Sig Dispense Refill  . amLODipine (NORVASC) 5 MG tablet Take 1 tablet (5 mg total) by mouth daily. 90 tablet 3  . Ascorbic Acid (VITAMIN C) 1000 MG tablet Take 1,000 mg by mouth daily.      [name] aspirin 81 MG tablet Take 81 mg by mouth daily.      . Cholecalciferol (VITAMIN D3) 2000 units capsule Take 1 capsule (2,000 Units total) by mouth daily. 100 capsule 3  . colchicine 0.6 MG tablet Take 1 tablet (0.6 mg total) by mouth as needed. If patient has flare up with gout 30 tablet 5  . Cyanocobalamin (VITAMIN B-12) 500 MCG SUBL Place 1 tablet under the tongue daily.    [name] dexlansoprazole (DEXILANT) 60 MG capsule Take 1 capsule (60 mg total) by mouth daily. 30 capsule 11  . FLUoxetine (PROZAC) 40 MG capsule Take 40 mg by mouth daily.    [name] losartan (COZAAR) 50 MG tablet Take 1 tablet (50 mg total) by mouth daily. 90 tablet 3  . metFORMIN (GLUCOPHAGE) 1000 MG tablet Take 1,000 mg by mouth 2 (two) times daily with a meal.    . oseltamivir (TAMIFLU) 75 MG capsule Take 1  capsule (75 mg total) by mouth daily. 10 capsule 0  . pantoprazole (PROTONIX) 40 MG tablet TAKE 1 TABLET TWICE DAILY. 180 tablet 2  . rosuvastatin (CRESTOR) 40 MG tablet Take 40 mg by mouth daily.    . sildenafil (REVATIO) 20 MG tablet Take 1-5 (20-100mg ) tablets by mouth daily as needed 60 tablet 5  . traMADol (ULTRAM) 50 MG tablet TAKE 1 OR 2 TABLETS FOUR TIMES DAILY AS NEEDED FOR PAIN. 100 tablet 2  . nitroGLYCERIN (NITROSTAT) 0.4 MG SL tablet Place 1 tablet (0.4 mg total) under the tongue every 5 (five) minutes as needed for chest pain. 25 tablet 3   No current  facility-administered medications for this visit.     Allergies  Atorvastatin; Bupropion hcl; Duloxetine; Fluoxetine hcl; Nexium [esomeprazole magnesium]; Simvastatin; Venlafaxine; Viibryd [vilazodone hcl]; Ketorolac; and Tamsulosin  Electrocardiogram:  10/25/12  SR rate 50 normal  11/2013  NSR rate 60 normal  01/24/15  SR rate 53  Nonspecific ST changes  12/11/15  SR rate 55 normal 01/21/17 SR PVC;s read as afib but not   Assessment and Plan CAD:  Distant LAD intervention Normal myovue 2012  Positive ETT 01/28/16 but myovue 02/04/16 normal perfusion  Images  Chol:  At goal continue current crestor dosing HTN:  Valsartan changed to cozaar due to recall     Jenkins Rouge

## 2017-01-21 ENCOUNTER — Encounter: Payer: Self-pay | Admitting: Cardiovascular Disease

## 2017-01-21 ENCOUNTER — Ambulatory Visit (INDEPENDENT_AMBULATORY_CARE_PROVIDER_SITE_OTHER): Payer: Medicare Other | Admitting: Cardiovascular Disease

## 2017-01-21 VITALS — BP 118/82 | HR 73 | Ht 68.0 in | Wt 178.0 lb

## 2017-01-21 DIAGNOSIS — I10 Essential (primary) hypertension: Secondary | ICD-10-CM | POA: Diagnosis not present

## 2017-01-21 DIAGNOSIS — I2510 Atherosclerotic heart disease of native coronary artery without angina pectoris: Secondary | ICD-10-CM | POA: Diagnosis not present

## 2017-01-21 MED ORDER — NITROGLYCERIN 0.4 MG SL SUBL: 0.4000 mg | SUBLINGUAL_TABLET | SUBLINGUAL | 3 refills | Status: DC | PRN

## 2017-01-21 NOTE — Patient Instructions (Addendum)
Medication Instructions:  Your physician has recommended you make the following change in your medication:  1-Take 1 Nitroglycerin, under your tongue, while sitting for chest pain. If no relief of pain may repeat Nitroglycerin, one tab every 5 minutes up to 3 tablets total over 15 minutes. If no relief CALL 911. If you have dizziness/lightheadness while taking NTG, stop taking and call 911.  Labwork: NONE  Testing/Procedures: NONE  Follow-Up: Your physician wants you to follow-up in: 12 months with Dr. Johnsie Cancel. You will receive a reminder letter in the mail two months in advance. If you don't receive a letter, please call our office to schedule the follow-up appointment.   If you need a refill on your cardiac medications before your next appointment, please call your pharmacy.

## 2017-02-09 ENCOUNTER — Telehealth: Payer: Self-pay | Admitting: Internal Medicine

## 2017-02-09 MED ORDER — ZOSTER VAC RECOMB ADJUVANTED 50 MCG/0.5ML IM SUSR: 0.5000 mL | Freq: Once | INTRAMUSCULAR | 0 refills | Status: AC

## 2017-02-09 NOTE — Telephone Encounter (Signed)
Called pt no answer LMOM rx has been sent ,,,Jared Alvarez

## 2017-02-09 NOTE — Telephone Encounter (Signed)
Patient is requesting shingrix.   Once approval has been made please send script to [facility].  Please follow up with patient in regard once sent.

## 2017-02-09 NOTE — Telephone Encounter (Signed)
This has been sent in. Can you call pt if you have a chance?

## 2017-02-17 ENCOUNTER — Ambulatory Visit (INDEPENDENT_AMBULATORY_CARE_PROVIDER_SITE_OTHER): Payer: Medicare Other | Admitting: Psychology

## 2017-02-17 DIAGNOSIS — F411 Generalized anxiety disorder: Secondary | ICD-10-CM

## 2017-02-17 DIAGNOSIS — F341 Dysthymic disorder: Secondary | ICD-10-CM | POA: Diagnosis not present

## 2017-02-19 ENCOUNTER — Other Ambulatory Visit: Payer: Self-pay | Admitting: Internal Medicine

## 2017-02-24 ENCOUNTER — Encounter: Payer: Self-pay | Admitting: Internal Medicine

## 2017-02-24 ENCOUNTER — Ambulatory Visit (INDEPENDENT_AMBULATORY_CARE_PROVIDER_SITE_OTHER): Payer: Medicare Other | Admitting: Internal Medicine

## 2017-02-24 VITALS — BP 118/78 | HR 67 | Temp 98.3°F | Ht 68.0 in | Wt 181.0 lb

## 2017-02-24 DIAGNOSIS — M5440 Lumbago with sciatica, unspecified side: Secondary | ICD-10-CM

## 2017-02-24 DIAGNOSIS — E785 Hyperlipidemia, unspecified: Secondary | ICD-10-CM | POA: Diagnosis not present

## 2017-02-24 DIAGNOSIS — G8929 Other chronic pain: Secondary | ICD-10-CM

## 2017-02-24 DIAGNOSIS — I2510 Atherosclerotic heart disease of native coronary artery without angina pectoris: Secondary | ICD-10-CM | POA: Diagnosis not present

## 2017-02-24 DIAGNOSIS — Z23 Encounter for immunization: Secondary | ICD-10-CM | POA: Diagnosis not present

## 2017-02-24 DIAGNOSIS — E1159 Type 2 diabetes mellitus with other circulatory complications: Secondary | ICD-10-CM

## 2017-02-24 MED ORDER — ONETOUCH DELICA LANCETS FINE MISC: 1.0000 | Freq: Every day | 3 refills | Status: DC | PRN

## 2017-02-24 MED ORDER — GLUCOSE BLOOD VI STRP: ORAL_STRIP | 11 refills | Status: DC

## 2017-02-24 MED ORDER — ZOSTER VAC RECOMB ADJUVANTED 50 MCG/0.5ML IM SUSR
0.5000 mL | Freq: Once | INTRAMUSCULAR | 1 refills | Status: AC
Start: 2017-02-24 — End: 2017-02-24

## 2017-02-24 MED ORDER — LORAZEPAM 1 MG PO TABS: 1.0000 mg | ORAL_TABLET | Freq: Two times a day (BID) | ORAL | 5 refills | Status: DC | PRN

## 2017-02-24 MED ORDER — TRAMADOL HCL 50 MG PO TABS: 50.0000 mg | ORAL_TABLET | Freq: Four times a day (QID) | ORAL | 2 refills | Status: DC | PRN

## 2017-02-24 NOTE — Patient Instructions (Signed)
The Original McKenzie Self Inflating Airback Lumbar Support

## 2017-02-24 NOTE — Assessment & Plan Note (Addendum)
The Original McKenzie Self Inflating Airback Lumbar Support Tramadol prn X ray if worse PT offered

## 2017-02-24 NOTE — Progress Notes (Signed)
Subjective:  Patient ID: Jared Alvarez, male    DOB: [birth year]  Age: 66 y.o. MRN: [id number]  CC: No chief complaint on file.   HPI Jozeph Persing [place] presents for chronic LBP - worse: 7-8/10. Tramadol prn F/u HTN, DM, B12 def  Outpatient Medications Prior to Visit  Medication Sig Dispense Refill  . amLODipine (NORVASC) 5 MG tablet Take 1 tablet (5 mg total) by mouth daily. 90 tablet 3  . Ascorbic Acid (VITAMIN C) 1000 MG tablet Take 1,000 mg by mouth daily.      [name] aspirin 81 MG tablet Take 81 mg by mouth daily.      . Cholecalciferol (VITAMIN D3) 2000 units capsule Take 1 capsule (2,000 Units total) by mouth daily. 100 capsule 3  . colchicine 0.6 MG tablet Take 1 tablet (0.6 mg total) by mouth as needed. If patient has flare up with gout 30 tablet 5  . Cyanocobalamin (VITAMIN B-12) 500 MCG SUBL Place 1 tablet under the tongue daily.    [name] FLUoxetine (PROZAC) 40 MG capsule Take 40 mg by mouth daily.    [name] losartan (COZAAR) 50 MG tablet Take 1 tablet (50 mg total) by mouth daily. 90 tablet 3  . metFORMIN (GLUCOPHAGE) 1000 MG tablet Take 1,000 mg by mouth 2 (two) times daily with a meal.    . nitroGLYCERIN (NITROSTAT) 0.4 MG SL tablet Place 1 tablet (0.4 mg total) under the tongue every 5 (five) minutes as needed for chest pain. 25 tablet 3  . oseltamivir (TAMIFLU) 75 MG capsule Take 1 capsule (75 mg total) by mouth daily. 10 capsule 0  . pantoprazole (PROTONIX) 40 MG tablet TAKE 1 TABLET TWICE DAILY. 180 tablet 2  . rosuvastatin (CRESTOR) 40 MG tablet Take 1 tablet (40 mg total) by mouth daily. Patient needs office visit before refills will be given 90 tablet 0  . sildenafil (REVATIO) 20 MG tablet Take 1-5 (20-100mg ) tablets by mouth daily as needed 60 tablet 5  . traMADol (ULTRAM) 50 MG tablet TAKE 1 OR 2 TABLETS FOUR TIMES DAILY AS NEEDED FOR PAIN. 100 tablet 2  . dexlansoprazole (DEXILANT) 60 MG capsule Take 1 capsule (60 mg total) by mouth daily. 30 capsule 11   No  facility-administered medications prior to visit.     ROS Review of Systems  Constitutional: Positive for fatigue. Negative for appetite change and unexpected weight change.  HENT: Negative for congestion, nosebleeds, sneezing, sore throat and trouble swallowing.   Eyes: Negative for itching and visual disturbance.  Respiratory: Negative for cough.   Cardiovascular: Negative for chest pain, palpitations and leg swelling.  Gastrointestinal: Negative for abdominal distention, blood in stool, diarrhea and nausea.  Genitourinary: Negative for frequency and hematuria.  Musculoskeletal: Positive for back pain. Negative for gait problem, joint swelling and neck pain.  Skin: Negative for rash.  Neurological: Negative for dizziness, tremors, speech difficulty and weakness.  Psychiatric/Behavioral: Negative for agitation, dysphoric mood and sleep disturbance. The patient is not nervous/anxious.     Objective:  BP 118/78 (BP Location: Left Arm, Patient Position: Sitting, Cuff Size: Normal)   Pulse 67   Temp 98.3 F (36.8 C) (Oral)   Ht 5\' 8"  (1.727 m)   Wt 181 lb (82.1 kg)   SpO2 98%   BMI 27.52 kg/m   BP Readings from Last 3 Encounters:  02/24/17 118/78  01/21/17 118/82  09/21/16 120/78    Wt Readings from Last 3 Encounters:  02/24/17 181 lb (82.1 kg)  01/21/17 178  lb (80.7 kg)  09/21/16 179 lb 1.9 oz (81.2 kg)    Physical Exam  Constitutional: He is oriented to person, place, and time. He appears well-developed. No distress.  NAD  HENT:  Mouth/Throat: Oropharynx is clear and moist.  Eyes: Pupils are equal, round, and reactive to light. Conjunctivae are normal.  Neck: Normal range of motion. No JVD present. No thyromegaly present.  Cardiovascular: Normal rate, regular rhythm, normal heart sounds and intact distal pulses.  Exam reveals no gallop and no friction rub.   No murmur heard. Pulmonary/Chest: Effort normal and breath sounds normal. No respiratory distress. He has no  wheezes. He has no rales. He exhibits no tenderness.  Abdominal: Soft. Bowel sounds are normal. He exhibits no distension and no mass. There is no tenderness. There is no rebound and no guarding.  Musculoskeletal: Normal range of motion. He exhibits tenderness. He exhibits no edema.  Lymphadenopathy:    He has no cervical adenopathy.  Neurological: He is alert and oriented to person, place, and time. He has normal reflexes. No cranial nerve deficit. He exhibits normal muscle tone. He displays a negative Romberg sign. Coordination and gait normal.  Skin: Skin is warm and dry. No rash noted.  Psychiatric: He has a normal mood and affect. His behavior is normal. Judgment and thought content normal.  LS tender  Lab Results  Component Value Date   WBC 5.2 09/22/2016   HGB 14.4 09/22/2016   HCT 42.1 09/22/2016   PLT 292.0 09/22/2016   GLUCOSE 132 (H) 09/22/2016   CHOL 181 09/22/2016   TRIG 72.0 09/22/2016   HDL 62.40 09/22/2016   LDLDIRECT 149.6 12/15/2011   LDLCALC 104 (H) 09/22/2016   ALT 12 09/22/2016   AST 14 09/22/2016   NA 142 09/22/2016   K 4.3 09/22/2016   CL 102 09/22/2016   CREATININE 0.85 09/22/2016   BUN 18 09/22/2016   CO2 29 09/22/2016   TSH 2.22 09/22/2016   PSA 0.64 09/22/2016   INR 0.98 06/03/2010   HGBA1C 7.0 (H) 09/22/2016    No results found.  Assessment & Plan:   There are no diagnoses linked to this encounter. I have discontinued Mr. Hubbert's dexlansoprazole. I am also having him maintain his aspirin, vitamin C, Vitamin B-12, Vitamin D3, traMADol, oseltamivir, pantoprazole, colchicine, amLODipine, sildenafil, losartan, FLUoxetine, metFORMIN, nitroGLYCERIN, rosuvastatin, and LORazepam.  Meds ordered this encounter  Medications  . LORazepam (ATIVAN) 1 MG tablet    Sig: Take 1 mg by mouth 2 (two) times daily as needed for anxiety.     Follow-up: No Follow-up on file.  Walker Kehr, MD

## 2017-02-24 NOTE — Assessment & Plan Note (Signed)
Labs

## 2017-03-12 ENCOUNTER — Ambulatory Visit (AMBULATORY_SURGERY_CENTER): Payer: Self-pay

## 2017-03-12 ENCOUNTER — Other Ambulatory Visit: Payer: Self-pay

## 2017-03-12 VITALS — Ht 68.0 in | Wt 183.4 lb

## 2017-03-12 DIAGNOSIS — Z86010 Personal history of colonic polyps: Secondary | ICD-10-CM

## 2017-03-12 MED ORDER — NA SULFATE-K SULFATE-MG SULF 17.5-3.13-1.6 GM/177ML PO SOLN
1.0000 | Freq: Once | ORAL | 0 refills | Status: AC
Start: 2017-03-12 — End: 2017-03-12

## 2017-03-12 NOTE — Progress Notes (Signed)
Denies allergies to eggs or soy products. Denies complication of anesthesia or sedation. Denies use of weight loss medication. Denies use of O2.   Emmi instructions declined.  

## 2017-04-01 ENCOUNTER — Encounter: Payer: Self-pay | Admitting: Internal Medicine

## 2017-04-01 ENCOUNTER — Ambulatory Visit (AMBULATORY_SURGERY_CENTER): Payer: Medicare Other | Admitting: Internal Medicine

## 2017-04-01 VITALS — BP 132/98 | HR 42 | Temp 96.9°F | Resp 8 | Ht 68.0 in | Wt 181.0 lb

## 2017-04-01 DIAGNOSIS — E119 Type 2 diabetes mellitus without complications: Secondary | ICD-10-CM | POA: Diagnosis not present

## 2017-04-01 DIAGNOSIS — D124 Benign neoplasm of descending colon: Secondary | ICD-10-CM | POA: Diagnosis not present

## 2017-04-01 DIAGNOSIS — I10 Essential (primary) hypertension: Secondary | ICD-10-CM | POA: Diagnosis not present

## 2017-04-01 DIAGNOSIS — K219 Gastro-esophageal reflux disease without esophagitis: Secondary | ICD-10-CM | POA: Diagnosis not present

## 2017-04-01 DIAGNOSIS — Z86010 Personal history of colonic polyps: Secondary | ICD-10-CM

## 2017-04-01 DIAGNOSIS — I2510 Atherosclerotic heart disease of native coronary artery without angina pectoris: Secondary | ICD-10-CM | POA: Diagnosis not present

## 2017-04-01 DIAGNOSIS — D123 Benign neoplasm of transverse colon: Secondary | ICD-10-CM | POA: Diagnosis not present

## 2017-04-01 MED ORDER — SODIUM CHLORIDE 0.9 % IV SOLN: 500.0000 mL | INTRAVENOUS | Status: DC

## 2017-04-01 NOTE — Patient Instructions (Signed)
**   Handouts given on polyps and diverticulosis **   YOU HAD AN ENDOSCOPIC PROCEDURE TODAY AT THE  ENDOSCOPY CENTER:   Refer to the procedure report that was given to you for any specific questions about what was found during the examination.  If the procedure report does not answer your questions, please call your gastroenterologist to clarify.  If you requested that your care partner not be given the details of your procedure findings, then the procedure report has been included in a sealed envelope for you to review at your convenience later.  YOU SHOULD EXPECT: Some feelings of bloating in the abdomen. Passage of more gas than usual.  Walking can help get rid of the air that was put into your GI tract during the procedure and reduce the bloating. If you had a lower endoscopy (such as a colonoscopy or flexible sigmoidoscopy) you may notice spotting of blood in your stool or on the toilet paper. If you underwent a bowel prep for your procedure, you may not have a normal bowel movement for a few days.  Please Note:  You might notice some irritation and congestion in your nose or some drainage.  This is from the oxygen used during your procedure.  There is no need for concern and it should clear up in a day or so.  SYMPTOMS TO REPORT IMMEDIATELY:   Following lower endoscopy (colonoscopy or flexible sigmoidoscopy):  Excessive amounts of blood in the stool  Significant tenderness or worsening of abdominal pains  Swelling of the abdomen that is new, acute  Fever of 100F or higher  For urgent or emergent issues, a gastroenterologist can be reached at any hour by calling [phone].   DIET:  We do recommend a small meal at first, but then you may proceed to your regular diet.  Drink plenty of fluids but you should avoid alcoholic beverages for 24 hours.  ACTIVITY:  You should plan to take it easy for the rest of today and you should NOT DRIVE or use heavy machinery until tomorrow (because  of the sedation medicines used during the test).    FOLLOW UP: Our staff will call the number listed on your records the next business day following your procedure to check on you and address any questions or concerns that you may have regarding the information given to you following your procedure. If we do not reach you, we will leave a message.  However, if you are feeling well and you are not experiencing any problems, there is no need to return our call.  We will assume that you have returned to your regular daily activities without incident.  If any biopsies were taken you will be contacted by phone or by letter within the next 1-3 weeks.  Please call us at [phone] if you have not heard about the biopsies in 3 weeks.    SIGNATURES/CONFIDENTIALITY: You and/or your care partner have signed paperwork which will be entered into your electronic medical record.  These signatures attest to the fact that that the information above on your After Visit Summary has been reviewed and is understood.  Full responsibility of the confidentiality of this discharge information lies with you and/or your care-partner. 

## 2017-04-01 NOTE — Progress Notes (Signed)
Called to room to assist during endoscopic procedure.  Patient ID and intended procedure confirmed with present staff. Received instructions for my participation in the procedure from the performing physician.  

## 2017-04-01 NOTE — Op Note (Signed)
[place] Patient Name: Jared Alvarez Procedure Date: 04/01/2017 8:49 AM MRN: [id number] Endoscopist: Docia Chuck. Henrene Pastor , MD Age: 66 Referring MD:  Date of Birth: Feb 13, 1951 Gender: Male Account #: [id number] Procedure:                Colonoscopy, With cold snare polypectomy x 3 Indications:              High risk colon cancer surveillance: Personal                            history of multiple (3 or more) adenomas. Index                            examination 2003 was negative. Last examination                            2013 with 5 small tubular adenomas Medicines:                Monitored Anesthesia Care Procedure:                Pre-Anesthesia Assessment:                           - Prior to the procedure, a History and Physical                            was performed, and patient medications and                            allergies were reviewed. The patient's tolerance of                            previous anesthesia was also reviewed. The risks                            and benefits of the procedure and the sedation                            options and risks were discussed with the patient.                            All questions were answered, and informed consent                            was obtained. Prior Anticoagulants: The patient has                            taken no previous anticoagulant or antiplatelet                            agents. ASA Grade Assessment: II - A patient with                            mild systemic disease. After reviewing the risks  and benefits, the patient was deemed in                            satisfactory condition to undergo the procedure.                           After obtaining informed consent, the colonoscope                            was passed under direct vision. Throughout the                            procedure, the patient's blood pressure, pulse, and   oxygen saturations were monitored continuously. The                            Colonoscope was introduced through the anus and                            advanced to the the cecum, identified by                            appendiceal orifice and ileocecal valve. The                            ileocecal valve, appendiceal orifice, and rectum                            were photographed. The quality of the bowel                            preparation was excellent. The colonoscopy was                            performed without difficulty. The patient tolerated                            the procedure well. The bowel preparation used was                            SUPREP. Scope In: 8:57:35 AM Scope Out: 9:19:23 AM Scope Withdrawal Time: 0 hours 18 minutes 37 seconds  Total Procedure Duration: 0 hours 21 minutes 48 seconds  Findings:                 Three polyps were found in the descending colon and                            transverse colon. The polyps were 2 to 3 mm in                            size. These polyps were removed with a cold snare.                            Resection  and retrieval were complete.                           Multiple small and large-mouthed diverticula were                            found in the sigmoid colon.                           Internal hemorrhoids were found during retroflexion.                           The exam was otherwise without abnormality on                            direct and retroflexion views. Complications:            No immediate complications. Estimated blood loss:                            None. Estimated Blood Loss:     Estimated blood loss: none. Impression:               - Three 2 to 3 mm polyps in the descending colon                            and in the transverse colon, removed with a cold                            snare. Resected and retrieved.                           - Diverticulosis in the sigmoid colon.                            - Internal hemorrhoids.                           - The examination was otherwise normal on direct                            and retroflexion views. Recommendation:           - Repeat colonoscopy in 3 - 5 years for                            surveillance.                           - Patient has a contact number available for                            emergencies. The signs and symptoms of potential                            delayed complications were discussed with the  patient. Return to normal activities tomorrow.                            Written discharge instructions were provided to the                            patient.                           - Resume previous diet.                           - Continue present medications.                           - Await pathology results. Docia Chuck. Henrene Pastor, MD 04/01/2017 9:26:14 AM This report has been signed electronically.

## 2017-04-01 NOTE — Progress Notes (Signed)
Report to PACU, RN, vss, BBS= Clear.  

## 2017-04-02 ENCOUNTER — Telehealth: Payer: Self-pay | Admitting: *Deleted

## 2017-04-02 ENCOUNTER — Telehealth: Payer: Self-pay

## 2017-04-02 NOTE — Telephone Encounter (Signed)
No answer, left message to call if questions or concerns. 

## 2017-04-02 NOTE — Telephone Encounter (Signed)
  Follow up Call-  Call back number 04/01/2017  Post procedure Call Back phone  # [phone]  Permission to leave phone message Yes  Some recent data might be hidden     Patient questions:  Do you have a fever, pain , or abdominal swelling? No. Pain Score  0 *  Have you tolerated food without any problems? Yes.    Have you been able to return to your normal activities? Yes.    Do you have any questions about your discharge instructions: Diet   No. Medications  No. Follow up visit  No.  Do you have questions or concerns about your Care? No.  Actions: * If pain score is 4 or above: No action needed, pain <4.

## 2017-04-08 ENCOUNTER — Encounter: Payer: Self-pay | Admitting: Internal Medicine

## 2017-04-13 ENCOUNTER — Ambulatory Visit: Payer: Medicare Other | Admitting: Psychology

## 2017-04-20 ENCOUNTER — Ambulatory Visit (INDEPENDENT_AMBULATORY_CARE_PROVIDER_SITE_OTHER): Payer: Medicare Other | Admitting: Psychology

## 2017-04-20 DIAGNOSIS — F411 Generalized anxiety disorder: Secondary | ICD-10-CM

## 2017-04-20 DIAGNOSIS — F341 Dysthymic disorder: Secondary | ICD-10-CM | POA: Diagnosis not present

## 2017-04-29 ENCOUNTER — Other Ambulatory Visit (INDEPENDENT_AMBULATORY_CARE_PROVIDER_SITE_OTHER): Payer: Medicare Other

## 2017-04-29 DIAGNOSIS — E785 Hyperlipidemia, unspecified: Secondary | ICD-10-CM

## 2017-04-29 DIAGNOSIS — G8929 Other chronic pain: Secondary | ICD-10-CM | POA: Diagnosis not present

## 2017-04-29 DIAGNOSIS — M5440 Lumbago with sciatica, unspecified side: Secondary | ICD-10-CM | POA: Diagnosis not present

## 2017-04-29 DIAGNOSIS — E1159 Type 2 diabetes mellitus with other circulatory complications: Secondary | ICD-10-CM

## 2017-04-29 LAB — MICROALBUMIN / CREATININE URINE RATIO
CREATININE, U: 110.1 mg/dL
Microalb Creat Ratio: 0.6 mg/g (ref 0.0–30.0)

## 2017-04-29 LAB — HEPATIC FUNCTION PANEL
ALBUMIN: 4.4 g/dL (ref 3.5–5.2)
ALT: 14 U/L (ref 0–53)
AST: 16 U/L (ref 0–37)
Alkaline Phosphatase: 73 U/L (ref 39–117)
Bilirubin, Direct: 0.2 mg/dL (ref 0.0–0.3)
TOTAL PROTEIN: 6.9 g/dL (ref 6.0–8.3)
Total Bilirubin: 0.8 mg/dL (ref 0.2–1.2)

## 2017-04-29 LAB — BASIC METABOLIC PANEL
BUN: 20 mg/dL (ref 6–23)
CALCIUM: 9.5 mg/dL (ref 8.4–10.5)
CO2: 29 meq/L (ref 19–32)
CREATININE: 0.93 mg/dL (ref 0.40–1.50)
Chloride: 100 mEq/L (ref 96–112)
GFR: 86.26 mL/min (ref >60.00)
Glucose, Bld: 133 mg/dL — ABNORMAL HIGH (ref 70–99)
Potassium: 4.4 mEq/L (ref 3.5–5.1)
SODIUM: 138 meq/L (ref 135–145)

## 2017-04-29 LAB — URINALYSIS
BILIRUBIN URINE: NEGATIVE
Hgb urine dipstick: NEGATIVE
KETONES UR: NEGATIVE
LEUKOCYTES UA: NEGATIVE
Nitrite: NEGATIVE
Specific Gravity, Urine: 1.02 (ref 1.000–1.030)
Total Protein, Urine: NEGATIVE
UROBILINOGEN UA: 0.2 (ref 0.0–1.0)
Urine Glucose: NEGATIVE
pH: 5.5 (ref 5.0–8.0)

## 2017-04-29 LAB — LIPID PANEL
CHOL/HDL RATIO: 2
Cholesterol: 159 mg/dL (ref 0–200)
HDL: 68.8 mg/dL (ref >39.00)
LDL CALC: 73 mg/dL (ref 0–99)
NonHDL: 89.73
TRIGLYCERIDES: 85 mg/dL (ref 0.0–149.0)
VLDL: 17 mg/dL (ref 0.0–40.0)

## 2017-04-29 LAB — HEMOGLOBIN A1C: Hgb A1c MFr Bld: 6.7 % — ABNORMAL HIGH (ref 4.6–6.5)

## 2017-04-29 LAB — TSH: TSH: 2.19 u[IU]/mL (ref 0.35–4.50)

## 2017-05-06 ENCOUNTER — Encounter: Payer: Self-pay | Admitting: Internal Medicine

## 2017-05-24 ENCOUNTER — Other Ambulatory Visit: Payer: Self-pay | Admitting: Internal Medicine

## 2017-05-25 NOTE — Telephone Encounter (Signed)
Per last visit MD stated to f/u Feb 2019 for wellness. Sent 30 day script until appt...Jared Alvarez

## 2017-06-09 DIAGNOSIS — E119 Type 2 diabetes mellitus without complications: Secondary | ICD-10-CM | POA: Diagnosis not present

## 2017-06-09 DIAGNOSIS — H5213 Myopia, bilateral: Secondary | ICD-10-CM | POA: Diagnosis not present

## 2017-06-09 DIAGNOSIS — H524 Presbyopia: Secondary | ICD-10-CM | POA: Diagnosis not present

## 2017-06-09 LAB — HM DIABETES EYE EXAM

## 2017-06-16 ENCOUNTER — Encounter: Payer: Self-pay | Admitting: Internal Medicine

## 2017-06-21 ENCOUNTER — Ambulatory Visit (INDEPENDENT_AMBULATORY_CARE_PROVIDER_SITE_OTHER): Payer: Medicare Other | Admitting: Psychology

## 2017-06-21 DIAGNOSIS — F411 Generalized anxiety disorder: Secondary | ICD-10-CM | POA: Diagnosis not present

## 2017-06-21 DIAGNOSIS — F341 Dysthymic disorder: Secondary | ICD-10-CM

## 2017-06-22 ENCOUNTER — Ambulatory Visit (INDEPENDENT_AMBULATORY_CARE_PROVIDER_SITE_OTHER): Payer: Medicare Other | Admitting: *Deleted

## 2017-06-22 ENCOUNTER — Telehealth: Payer: Self-pay | Admitting: *Deleted

## 2017-06-22 ENCOUNTER — Encounter: Payer: Self-pay | Admitting: Internal Medicine

## 2017-06-22 VITALS — BP 124/72 | HR 54 | Resp 18 | Ht 68.0 in | Wt 181.0 lb

## 2017-06-22 DIAGNOSIS — Z0000 Encounter for general adult medical examination without abnormal findings: Secondary | ICD-10-CM

## 2017-06-22 DIAGNOSIS — E1159 Type 2 diabetes mellitus with other circulatory complications: Secondary | ICD-10-CM | POA: Diagnosis not present

## 2017-06-22 DIAGNOSIS — M79645 Pain in left finger(s): Secondary | ICD-10-CM

## 2017-06-22 NOTE — Telephone Encounter (Signed)
During AWV, patient requested to have all of his medications refilled for 90 days except for colchicine and sildenafil which are out of pocket medications for patient. He also states that his left thumb continues to have pain and is "clicking", the hand and wrist guard that PCP suggested has not been very effective and patient asked if he could have a referral to have left thumb, click and pain evaluated further. Patient's last visit with PCP was 02/24/17

## 2017-06-22 NOTE — Progress Notes (Addendum)
Subjective:   Jared Alvarez is a 67 y.o. male who presents for an Initial Medicare Annual Wellness Visit.  Review of Systems  No ROS.  Medicare Wellness Visit. Additional risk factors are reflected in the social history.  Cardiac Risk Factors include: advanced age (>58men, >32 women);diabetes mellitus;dyslipidemia;hypertension;male gender , Sleep patterns: gets up 1 times nightly to void and sleeps 6  hours nightly. Patient reports insomnia issues, discussed recommended sleep tips.     Home Safety/Smoke Alarms: Feels safe in home. Smoke alarms in place.  Living environment; residence and Firearm Safety: 2-story house, no firearms. Lives with family, no needs for DME, good support system Seat Belt Safety/Bike Helmet: Wears seat belt.  Male:   PSA-  Lab Results  Component Value Date   PSA 0.64 09/22/2016   PSA 0.50 08/28/2015   PSA 0.40 08/24/2014      Objective:    Today's Vitals   06/22/17 0922  BP: 124/72  Pulse: (!) 54  Resp: 18  SpO2: 98%  Weight: 181 lb (82.1 kg)  Height: 5\' 8"  (1.727 m)   Body mass index is 27.52 kg/m.  Advanced Directives 06/22/2017  Does Patient Have a Medical Advance Directive? Yes  Type of Paramedic of [place];Living will  Copy of [place] in Chart? No - copy requested    Current Medications (verified) Outpatient Encounter Medications as of 06/22/2017  Medication Sig  . acetaminophen (TYLENOL) 500 MG tablet Take 500 mg by mouth every 6 (six) hours as needed.  [name] amLODipine (NORVASC) 5 MG tablet Take 1 tablet (5 mg total) by mouth daily.  . Ascorbic Acid (VITAMIN C) 1000 MG tablet Take 1,000 mg by mouth daily.    [name] aspirin 81 MG tablet Take 81 mg by mouth daily.    . Cholecalciferol (VITAMIN D3) 2000 units capsule Take 1 capsule (2,000 Units total) by mouth daily.  . colchicine 0.6 MG tablet Take 1 tablet (0.6 mg total) by mouth as needed. If patient has flare up with gout  .  Cyanocobalamin (VITAMIN B-12) 500 MCG SUBL Place 1 tablet under the tongue daily.  [name] FLUoxetine (PROZAC) 40 MG capsule Take 1 capsule (40 mg total) by mouth daily. Annual appt due in Feb must see provider for future refills  . glucose blood (ONETOUCH VERIO) test strip Use as instructed  . LORazepam (ATIVAN) 1 MG tablet Take 1 tablet (1 mg total) by mouth 2 (two) times daily as needed for anxiety.  . metFORMIN (GLUCOPHAGE) 1000 MG tablet Take 1,000 mg by mouth 2 (two) times daily with a meal.  . ONETOUCH DELICA LANCETS FINE MISC 1 Device by Does not apply route daily as needed.  . pantoprazole (PROTONIX) 40 MG tablet Take 1 tablet (40 mg total) by mouth 2 (two) times daily. Annual appt due in Feb must see provider for future refills  . ranitidine (ZANTAC) 150 MG/10ML syrup Take 150 mg as needed by mouth for heartburn.  . rosuvastatin (CRESTOR) 40 MG tablet Take 1 tablet (40 mg total) by mouth daily. Annual appt due in Feb must see provider for future refills  . sildenafil (REVATIO) 20 MG tablet Take 1-5 (20-100mg ) tablets by mouth daily as needed  . traMADol (ULTRAM) 50 MG tablet Take 1 tablet (50 mg total) by mouth every 6 (six) hours as needed for severe pain.  [name] losartan (COZAAR) 50 MG tablet Take 1 tablet (50 mg total) by mouth daily.  . nitroGLYCERIN (NITROSTAT) 0.4 MG SL  tablet Place 1 tablet (0.4 mg total) under the tongue every 5 (five) minutes as needed for chest pain.   No facility-administered encounter medications on file as of 06/22/2017.     Allergies (verified) Atorvastatin; Bupropion hcl; Duloxetine; Nexium [esomeprazole magnesium]; Simvastatin; Venlafaxine; Viibryd [vilazodone hcl]; Ketorolac; and Tamsulosin   History: Past Medical History:  Diagnosis Date  . Allergy   . Anxiety    no per pt  . Coronary artery disease   . Depression    no per pt  . DM type 2 (diabetes mellitus, type 2) ([place])   . GERD (gastroesophageal reflux disease)   . Gout   . Hearing loss   .  Hyperlipidemia   . Hypertension   . Hypogonadism male   . Inguinal hernia    right  . LBP (low back pain)   . Leg cramps   . Osteoarthritis   . Skin neoplasm   . Tinnitus   . Vertigo   . Vitamin B12 deficiency    Past Surgical History:  Procedure Laterality Date  . heart stent     in 1997  . INGUINAL HERNIA REPAIR     Family History  Problem Relation Age of Onset  . Heart disease Father   . Arthritis Sister   . Heart disease Brother   . Coronary artery disease Other   . Hypertension Other   . Lupus Sister   . Colon cancer Neg Hx   . Esophageal cancer Neg Hx   . Stomach cancer Neg Hx   . Rectal cancer Neg Hx    Social History   Socioeconomic History  . Marital status: Married    Spouse name: None  . Number of children: None  . Years of education: None  . Highest education level: None  Social Needs  . Financial resource strain: Not hard at all  . Food insecurity - worry: Never true  . Food insecurity - inability: Never true  . Transportation needs - medical: No  . Transportation needs - non-medical: No  Occupational History  . Occupation: Location manager: Industrial/product designer  Tobacco Use  . Smoking status: Never Smoker  . Smokeless tobacco: Never Used  Substance and Sexual Activity  . Alcohol use: Yes    Alcohol/week: 4.2 oz    Types: 7 Glasses of Omnia Dollinger per week  . Drug use: No  . Sexual activity: Yes  Other Topics Concern  . None  Social History Narrative  . None   Tobacco Counseling Counseling given: Not Answered  Activities of Daily Living In your present state of health, do you have any difficulty performing the following activities: 06/22/2017  Hearing? N  Vision? N  Difficulty concentrating or making decisions? N  Walking or climbing stairs? N  Dressing or bathing? N  Doing errands, shopping? N  Preparing Food and eating ? N  Using the Toilet? N  In the past six months, have you accidently leaked urine? N  Do you  have problems with loss of bowel control? N  Managing your Medications? N  Managing your Finances? N  Housekeeping or managing your Housekeeping? N  Some recent data might be hidden     Immunizations and Health Maintenance Immunization History  Administered Date(s) Administered  . Influenza Whole 03/22/2009, 03/24/2010  . Influenza, High Dose Seasonal PF 03/18/2016  . Influenza, Seasonal, Injecte, Preservative Fre 04/11/2012  . Influenza,inj,Quad PF,6+ Mos 04/13/2013, 01/26/2014  . Pneumococcal Conjugate-13 08/08/2013  . Pneumococcal  Polysaccharide-23 04/11/2012  . Tdap 04/18/2014  . Zoster 04/18/2014   Health Maintenance Due  Topic Date Due  . FOOT EXAM  10/19/1960  . PNA vac Low Risk Adult (2 of 2 - PPSV23) 04/11/2017    Patient Care Team: Plotnikov, Evie Lacks, MD as PCP - General  Indicate any recent Medical Services you may have received from other than [facility] providers in the past year (date may be approximate).    Assessment:   This is a routine wellness examination for [facility]. Physical assessment deferred to PCP.   Hearing/Vision screen Hearing Screening Comments: Able to hear conversational tones w/o difficulty. No issues reported.  Passed whisper test Vision Screening Comments: appointment yearly Dr. Kathrin Penner  Dietary issues and exercise activities discussed: Current Exercise Habits: Home exercise routine;Structured exercise class, Type of exercise: stretching;calisthenics;walking(swimming), Time (Minutes): 40, Frequency (Times/Week): 5, Weekly Exercise (Minutes/Week): 200, Intensity: Moderate, Exercise limited by: None identified  Diet (meal preparation, eat out, water intake, caffeinated beverages, dairy products, fruits and vegetables): in general, a "healthy" diet  , well balanced   Reviewed heart healthy and diabetic diet, encouraged patient to increase daily water intake.   Goals    . Patient Stated     I want to work on shaping the upper portion of my  chest, arms, abdomen. I will do this by watching carbohydrates, eating more protein, and asking gym instructor what machines and exercises will shape me up. I will continue to enjoy music, family and life.      Depression Screen PHQ 2/9 Scores 06/22/2017 06/02/2016  PHQ - 2 Score 2 0  PHQ- 9 Score 4 -    Fall Risk Fall Risk  06/02/2016  Falls in the past year? No   Cognitive Function: MMSE - Mini Mental State Exam 06/22/2017  Orientation to time 5  Orientation to Place 5  Registration 3  Attention/ Calculation 5  Recall 3  Language- name 2 objects 2  Language- repeat 1  Language- follow 3 step command 3  Language- read & follow direction 1  Write a sentence 1  Copy design 1  Total score 30        Screening Tests Health Maintenance  Topic Date Due  . FOOT EXAM  10/19/1960  . PNA vac Low Risk Adult (2 of 2 - PPSV23) 04/11/2017  . INFLUENZA VACCINE  02/03/2018 (Originally 12/02/2016)  . HEMOGLOBIN A1C  10/28/2017  . URINE MICROALBUMIN  04/29/2018  . OPHTHALMOLOGY EXAM  05/04/2018  . COLONOSCOPY  04/01/2020  . TETANUS/TDAP  04/18/2024  . Hepatitis C Screening  Completed      Plan:    Continue doing brain stimulating activities (puzzles, reading, adult coloring books, staying active) to keep memory sharp.   Continue to eat heart healthy diet (full of fruits, vegetables, whole grains, lean protein, water--limit salt, fat, and sugar intake) and increase physical activity as tolerated.  I have personally reviewed and noted the following in the patient's chart:   . Medical and social history . Use of alcohol, tobacco or illicit drugs  . Current medications and supplements . Functional ability and status . Nutritional status . Physical activity . Advanced directives . List of other physicians . Vitals . Screenings to include cognitive, depression, and falls . Referrals and appointments  In addition, I have reviewed and discussed with patient certain preventive  protocols, quality metrics, and best practice recommendations. A written personalized care plan for preventive services as well as general preventive health recommendations were provided to  patient.     Michiel Cowboy, RN   06/22/2017   Medical screening examination/treatment/procedure(s) were performed by non-physician practitioner and as supervising physician I was immediately available for consultation/collaboration. I agree with above. Lew Dawes, MD

## 2017-06-22 NOTE — Patient Instructions (Addendum)
100% lavender oil may help with sleep.  Continue doing brain stimulating activities (puzzles, reading, adult coloring books, staying active) to keep memory sharp.   Continue to eat heart healthy diet (full of fruits, vegetables, whole grains, lean protein, water--limit salt, fat, and sugar intake) and increase physical activity as tolerated.   Mr. Lamagna , Thank you for taking time to come for your Medicare Wellness Visit. I appreciate your ongoing commitment to your health goals. Please review the following plan we discussed and let me know if I can assist you in the future.   These are the goals we discussed: Goals    . Patient Stated     I want to work on shaping the upper portion of my chest, arms, abdomen. I will do this by watching carbohydrates, eating more protein, and asking gym instructor what machines and exercises will shape me up. I will continue to enjoy music, family and life.       This is a list of the screening recommended for you and due dates:  Health Maintenance  Topic Date Due  . Complete foot exam   10/19/1960  . Eye exam for diabetics  05/11/2015  . Pneumonia vaccines (2 of 2 - PPSV23) 04/11/2017  . Flu Shot  02/03/2018*  . Hemoglobin A1C  10/28/2017  . Urine Protein Check  04/29/2018  . Colon Cancer Screening  04/01/2020  . Tetanus Vaccine  04/18/2024  .  Hepatitis C: One time screening is recommended by Center for Disease Control  (CDC) for  adults born from [birth year] through 1965.   Completed  *Topic was postponed. The date shown is not the original due date.

## 2017-06-23 MED ORDER — RANITIDINE HCL 150 MG/10ML PO SYRP: 150.0000 mg | ORAL_SOLUTION | ORAL | 2 refills | Status: DC | PRN

## 2017-06-23 MED ORDER — NITROGLYCERIN 0.4 MG SL SUBL: 0.4000 mg | SUBLINGUAL_TABLET | SUBLINGUAL | 3 refills | Status: DC | PRN

## 2017-06-23 MED ORDER — FLUOXETINE HCL 40 MG PO CAPS: 40.0000 mg | ORAL_CAPSULE | Freq: Every day | ORAL | 2 refills | Status: DC

## 2017-06-23 MED ORDER — LOSARTAN POTASSIUM 50 MG PO TABS: 50.0000 mg | ORAL_TABLET | Freq: Every day | ORAL | 2 refills | Status: DC

## 2017-06-23 MED ORDER — ROSUVASTATIN CALCIUM 40 MG PO TABS: 40.0000 mg | ORAL_TABLET | Freq: Every day | ORAL | 2 refills | Status: DC

## 2017-06-23 MED ORDER — VITAMIN C 1000 MG PO TABS: 1000.0000 mg | ORAL_TABLET | Freq: Every day | ORAL | 2 refills | Status: DC

## 2017-06-23 MED ORDER — TRAMADOL HCL 50 MG PO TABS: 50.0000 mg | ORAL_TABLET | Freq: Four times a day (QID) | ORAL | 2 refills | Status: DC | PRN

## 2017-06-23 MED ORDER — VITAMIN B-12 500 MCG SL SUBL: 1.0000 | SUBLINGUAL_TABLET | Freq: Every day | SUBLINGUAL | 2 refills | Status: AC

## 2017-06-23 MED ORDER — ACETAMINOPHEN 500 MG PO TABS: 500.0000 mg | ORAL_TABLET | Freq: Four times a day (QID) | ORAL | 2 refills | Status: DC | PRN

## 2017-06-23 MED ORDER — ASPIRIN 81 MG PO TABS: 81.0000 mg | ORAL_TABLET | Freq: Every day | ORAL | 2 refills | Status: AC

## 2017-06-23 MED ORDER — ONETOUCH DELICA LANCETS FINE MISC: 1.0000 | Freq: Every day | 3 refills | Status: DC | PRN

## 2017-06-23 MED ORDER — LORAZEPAM 1 MG PO TABS: 1.0000 mg | ORAL_TABLET | Freq: Two times a day (BID) | ORAL | 2 refills | Status: DC | PRN

## 2017-06-23 MED ORDER — METFORMIN HCL 1000 MG PO TABS: 1000.0000 mg | ORAL_TABLET | Freq: Two times a day (BID) | ORAL | 2 refills | Status: DC

## 2017-06-23 MED ORDER — VITAMIN D3 50 MCG (2000 UT) PO CAPS: 2000.0000 [IU] | ORAL_CAPSULE | Freq: Every day | ORAL | 2 refills | Status: DC

## 2017-06-23 MED ORDER — AMLODIPINE BESYLATE 5 MG PO TABS: 5.0000 mg | ORAL_TABLET | Freq: Every day | ORAL | 2 refills | Status: DC

## 2017-06-23 MED ORDER — PANTOPRAZOLE SODIUM 40 MG PO TBEC: 40.0000 mg | DELAYED_RELEASE_TABLET | Freq: Two times a day (BID) | ORAL | 2 refills | Status: DC

## 2017-06-23 MED ORDER — GLUCOSE BLOOD VI STRP: ORAL_STRIP | 2 refills | Status: DC

## 2017-06-23 NOTE — Telephone Encounter (Signed)
Okay to renew his meds as requested. Okay to refer to see Dr. Tamala Julian, sports medicine Thank you

## 2017-06-23 NOTE — Telephone Encounter (Signed)
Called patient to inform prescription refills will be sent to [facility] and that a referral will be sent to sports medicine to have patient's left thumb evaluated.

## 2017-07-01 ENCOUNTER — Ambulatory Visit (INDEPENDENT_AMBULATORY_CARE_PROVIDER_SITE_OTHER): Payer: Medicare Other | Admitting: Family Medicine

## 2017-07-01 ENCOUNTER — Encounter: Payer: Self-pay | Admitting: Internal Medicine

## 2017-07-01 ENCOUNTER — Encounter: Payer: Self-pay | Admitting: Family Medicine

## 2017-07-01 VITALS — BP 141/82 | HR 66 | Temp 98.3°F | Ht 68.0 in | Wt 180.0 lb

## 2017-07-01 DIAGNOSIS — G8929 Other chronic pain: Secondary | ICD-10-CM

## 2017-07-01 DIAGNOSIS — M79645 Pain in left finger(s): Secondary | ICD-10-CM

## 2017-07-01 NOTE — Progress Notes (Signed)
Jared Alvarez - 67 y.o. male MRN [id number]  Date of birth: 1950-08-01  SUBJECTIVE:  Including CC & ROS.  Chief Complaint  Patient presents with  . Left thumb pain    Jared Alvarez is a 67 y.o. male that is presenting with left thumb pain. Pain is chronic in nature over the past year and has been increasing. Denies injury. He plays the guitar daily. Pain is located in the [facility] joint does radiate down to his wrist. Described as an achy. He feels his joint "click" at times. He has been wearing a brace at night.  The pain is worse after playing his guitar.     Review of Systems  Constitutional: Negative for fever.  HENT: Negative for sore throat.   Respiratory: Negative for cough.   Cardiovascular: Negative for chest pain.  Gastrointestinal: Negative for abdominal pain.  Musculoskeletal: Positive for arthralgias.  Skin: Negative for color change.  Neurological: Negative for weakness.  Hematological: Negative for adenopathy.  Psychiatric/Behavioral: Negative for agitation.    HISTORY: Past Medical, Surgical, Social, and Family History Reviewed & Updated per EMR.   Pertinent Historical Findings include:  Past Medical History:  Diagnosis Date  . Allergy   . Anxiety    no per pt  . Coronary artery disease   . Depression    no per pt  . DM type 2 (diabetes mellitus, type 2) ([place])   . GERD (gastroesophageal reflux disease)   . Gout   . Hearing loss   . Hyperlipidemia   . Hypertension   . Hypogonadism male   . Inguinal hernia    right  . LBP (low back pain)   . Leg cramps   . Osteoarthritis   . Skin neoplasm   . Tinnitus   . Vertigo   . Vitamin B12 deficiency     Past Surgical History:  Procedure Laterality Date  . heart stent     in 1997  . INGUINAL HERNIA REPAIR      Allergies  Allergen Reactions  . Atorvastatin     REACTION: pains  . Bupropion Hcl     REACTION: side effects  . Duloxetine     REACTION: dizzy  . Nexium [Esomeprazole Magnesium]    Rash on legs  . Simvastatin     REACTION: pain  . Venlafaxine     REACTION: side effects  . Viibryd [Vilazodone Hcl]     nightmares  . Ketorolac Palpitations  . Tamsulosin Palpitations    Family History  Problem Relation Age of Onset  . Heart disease Father   . Arthritis Sister   . Heart disease Brother   . Coronary artery disease Other   . Hypertension Other   . Lupus Sister   . Colon cancer Neg Hx   . Esophageal cancer Neg Hx   . Stomach cancer Neg Hx   . Rectal cancer Neg Hx      Social History   Socioeconomic History  . Marital status: Married    Spouse name: Not on file  . Number of children: Not on file  . Years of education: Not on file  . Highest education level: Not on file  Social Needs  . Financial resource strain: Not hard at all  . Food insecurity - worry: Never true  . Food insecurity - inability: Never true  . Transportation needs - medical: No  . Transportation needs - non-medical: No  Occupational History  . Occupation: Therapist, art  Employer: [employer]  Tobacco Use  . Smoking status: Never Smoker  . Smokeless tobacco: Never Used  Substance and Sexual Activity  . Alcohol use: Yes    Alcohol/week: 4.2 oz    Types: 7 Glasses of wine per week  . Drug use: No  . Sexual activity: Yes  Other Topics Concern  . Not on file  Social History Narrative  . Not on file     PHYSICAL EXAM:  VS: BP (!) 141/82 (BP Location: Left Arm, Patient Position: Sitting, Cuff Size: Normal)   Pulse 66   Temp 98.3 F (36.8 C) (Oral)   Ht 5\' 8"  (1.727 m)   Wt 180 lb (81.6 kg)   SpO2 99%   BMI 27.37 kg/m  Physical Exam Gen: NAD, alert, cooperative with exam, well-appearing ENT: normal lips, normal nasal mucosa,  Eye: normal EOM, normal conjunctiva and lids CV:  no edema, +2 pedal pulses   Resp: no accessory muscle use, non-labored,  Skin: no rashes, no areas of induration  Neuro: normal tone, normal sensation to touch Psych:   normal insight, alert and oriented MSK:  Left thumb: No obvious swelling or ecchymosis. Normal thumb range of motion. Normal pincer grasp. Normal wrist range of motion. Negative Finkelstein's test. No signs of atrophy. Neurovascularly intact  Limited ultrasound: Left thumb:  First dorsal compartment is normal in appearance CMC joint with degenerative changes and effusion Median nerve with no significant swelling  Summary: Findings suggestive of CMC arthritis  Ultrasound and interpretation by Clearance Coots, MD          ASSESSMENT & PLAN:   Chronic pain of left thumb Pain likely the result of CMC arthritis. - Discussed injection and deferred today - can use pennsaid  - if no improvement would do injection

## 2017-07-01 NOTE — Progress Notes (Signed)
Outside notes received. Information abstracted. Notes sent to scan.  

## 2017-07-01 NOTE — Patient Instructions (Signed)
Take tylenol 650 mg three times a day is the best evidence based medicine we have for arthritis.   Glucosamine sulfate 750mg  twice a day is a supplement that has been shown to help moderate to severe arthritis.  Vitamin D 2000 IU daily  Fish oil 2 grams daily.   Tumeric 500mg  twice daily.   Capsaicin topically up to four times a day may also help with pain.  Cortisone injections are an option if these interventions do not seem to make a difference or need more relief.   Please follow up in 4 weeks.

## 2017-07-02 DIAGNOSIS — M79645 Pain in left finger(s): Principal | ICD-10-CM

## 2017-07-02 DIAGNOSIS — G8929 Other chronic pain: Secondary | ICD-10-CM | POA: Insufficient documentation

## 2017-07-02 NOTE — Assessment & Plan Note (Signed)
Pain likely the result of CMC arthritis. - Discussed injection and deferred today - can use pennsaid  - if no improvement would do injection

## 2017-07-07 ENCOUNTER — Ambulatory Visit (INDEPENDENT_AMBULATORY_CARE_PROVIDER_SITE_OTHER): Payer: Medicare Other | Admitting: Psychology

## 2017-07-07 DIAGNOSIS — F411 Generalized anxiety disorder: Secondary | ICD-10-CM

## 2017-07-07 DIAGNOSIS — F341 Dysthymic disorder: Secondary | ICD-10-CM | POA: Diagnosis not present

## 2017-08-04 ENCOUNTER — Ambulatory Visit (INDEPENDENT_AMBULATORY_CARE_PROVIDER_SITE_OTHER): Payer: Medicare Other | Admitting: Psychology

## 2017-08-04 DIAGNOSIS — F411 Generalized anxiety disorder: Secondary | ICD-10-CM

## 2017-08-04 DIAGNOSIS — F341 Dysthymic disorder: Secondary | ICD-10-CM

## 2017-08-10 ENCOUNTER — Ambulatory Visit (INDEPENDENT_AMBULATORY_CARE_PROVIDER_SITE_OTHER): Payer: Medicare Other | Admitting: Psychology

## 2017-08-10 DIAGNOSIS — F411 Generalized anxiety disorder: Secondary | ICD-10-CM

## 2017-08-10 DIAGNOSIS — F341 Dysthymic disorder: Secondary | ICD-10-CM | POA: Diagnosis not present

## 2017-08-12 ENCOUNTER — Telehealth: Payer: Self-pay | Admitting: *Deleted

## 2017-08-12 NOTE — Telephone Encounter (Signed)
Called patient in response to him leaving a VM for nurse. Patient states that he was charged for the AWV he had 06/22/17. Medicare stated to him the charge is due to him having the visit before the medical part of his Medicare had been in effect for a year. The effective date of medical Medicare for Jared Alvarez is 08/02/16. Patient explained that when he made the appointment he stated that he needed an appointment to see his PCP to refill his medications. At that time, he was told due to his insurance he should be scheduled for an AWV and he came to the AWV as scheduled. Jared Alvarez explained that he did not know about AWVs, and has since learned from Medicare that he should have been scheduled for a Welcome to Medicare visit. Normally, he would have come to see his PCP, had his medications refilled, and would not have been charged full price for the visit. Patient states given the circumstance of what happened, it does seem right that he should have to pay for this bill. Nurse apologized regarding the situation and explained that she would take this information to her Practice Administrator to see if the situation could be resolved. Patient was thankful and appreciated the callback.

## 2017-08-12 NOTE — Telephone Encounter (Signed)
Nurse called patient and informed him that Jared Alvarez sends her apologies that he got charged for the AWV and explained that [name] will gladly reach out to billing to ensure that the patient is not responsible for paying for the bill he received. [name] was greatly appreciative and voiced understanding regarding the situation.

## 2017-08-25 ENCOUNTER — Other Ambulatory Visit: Payer: Self-pay | Admitting: Internal Medicine

## 2017-08-30 ENCOUNTER — Ambulatory Visit: Payer: Medicare Other | Admitting: Psychology

## 2017-09-03 ENCOUNTER — Telehealth: Payer: Self-pay | Admitting: Internal Medicine

## 2017-09-03 NOTE — Telephone Encounter (Signed)
Pt called and notified that he needed to be scheduled for an annual visit in order to receive 90 day refills on requested medications. Pt became a little upset and stated that he is now on Medicare and came in for an annual wellness visit in 06/2017 and doesn't understand why he can not get his refills.Pt's last OV with Dr. Alain Marion was in Oct 2018. Pt is stating that he thought that he would not have to come in for a visit with Dr. Alain Marion due to having the medicare wellness visit.

## 2017-09-03 NOTE — Telephone Encounter (Signed)
Copied from [place] [id number]. Topic: Quick Communication - Rx Refill/Question >> Sep 03, 2017 10:20 AM Lennox Solders wrote: Medication:fluoxetine #90,metformin #180, pantoprazole #180 and rosuvastain 40 mg #90 Has the patient contacted their pharmacy? yes (Agent: If no, request that the patient contact the pharmacy for the refill.) Preferred Pharmacy (with phone number or street name): [facility] Agent: Please be advised that RX refills may take up to 3 business days. We ask that you follow-up with your pharmacy. Pt had awv in feb 2019. Pt would like 90 day supply of above medications

## 2017-09-06 MED ORDER — PANTOPRAZOLE SODIUM 40 MG PO TBEC: 40.0000 mg | DELAYED_RELEASE_TABLET | Freq: Two times a day (BID) | ORAL | 1 refills | Status: DC

## 2017-09-06 MED ORDER — FLUOXETINE HCL 40 MG PO CAPS: 40.0000 mg | ORAL_CAPSULE | Freq: Every day | ORAL | 1 refills | Status: DC

## 2017-09-06 MED ORDER — ROSUVASTATIN CALCIUM 40 MG PO TABS: 40.0000 mg | ORAL_TABLET | Freq: Every day | ORAL | 1 refills | Status: DC

## 2017-09-06 MED ORDER — METFORMIN HCL 1000 MG PO TABS: 1000.0000 mg | ORAL_TABLET | Freq: Two times a day (BID) | ORAL | 1 refills | Status: DC

## 2017-09-06 MED ORDER — AMLODIPINE BESYLATE 5 MG PO TABS: 5.0000 mg | ORAL_TABLET | Freq: Every day | ORAL | 1 refills | Status: DC

## 2017-09-06 MED ORDER — LOSARTAN POTASSIUM 50 MG PO TABS: 50.0000 mg | ORAL_TABLET | Freq: Every day | ORAL | 1 refills | Status: DC

## 2017-09-06 NOTE — Telephone Encounter (Signed)
Updated med list sent refills to [facility].Marland KitchenJohny Chess

## 2017-09-06 NOTE — Telephone Encounter (Signed)
Ok to renew meds Thx

## 2017-09-07 ENCOUNTER — Ambulatory Visit (INDEPENDENT_AMBULATORY_CARE_PROVIDER_SITE_OTHER): Payer: Medicare Other | Admitting: Psychology

## 2017-09-07 DIAGNOSIS — F411 Generalized anxiety disorder: Secondary | ICD-10-CM | POA: Diagnosis not present

## 2017-09-07 DIAGNOSIS — F341 Dysthymic disorder: Secondary | ICD-10-CM

## 2017-10-06 ENCOUNTER — Ambulatory Visit: Payer: Medicare Other | Admitting: Psychology

## 2017-10-19 ENCOUNTER — Ambulatory Visit (INDEPENDENT_AMBULATORY_CARE_PROVIDER_SITE_OTHER): Payer: Medicare Other | Admitting: Psychology

## 2017-10-19 DIAGNOSIS — F411 Generalized anxiety disorder: Secondary | ICD-10-CM | POA: Diagnosis not present

## 2017-10-19 DIAGNOSIS — F341 Dysthymic disorder: Secondary | ICD-10-CM | POA: Diagnosis not present

## 2017-10-20 ENCOUNTER — Ambulatory Visit: Payer: Medicare Other | Admitting: Psychology

## 2017-10-28 ENCOUNTER — Ambulatory Visit (INDEPENDENT_AMBULATORY_CARE_PROVIDER_SITE_OTHER): Payer: Medicare Other | Admitting: Psychology

## 2017-10-28 DIAGNOSIS — F411 Generalized anxiety disorder: Secondary | ICD-10-CM

## 2017-10-28 DIAGNOSIS — F341 Dysthymic disorder: Secondary | ICD-10-CM | POA: Diagnosis not present

## 2017-11-02 ENCOUNTER — Ambulatory Visit (INDEPENDENT_AMBULATORY_CARE_PROVIDER_SITE_OTHER): Payer: Medicare Other | Admitting: Psychology

## 2017-11-02 DIAGNOSIS — F341 Dysthymic disorder: Secondary | ICD-10-CM

## 2017-11-02 DIAGNOSIS — F411 Generalized anxiety disorder: Secondary | ICD-10-CM | POA: Diagnosis not present

## 2017-11-09 ENCOUNTER — Ambulatory Visit (INDEPENDENT_AMBULATORY_CARE_PROVIDER_SITE_OTHER): Payer: Medicare Other | Admitting: Psychology

## 2017-11-09 DIAGNOSIS — F411 Generalized anxiety disorder: Secondary | ICD-10-CM | POA: Diagnosis not present

## 2017-11-09 DIAGNOSIS — F341 Dysthymic disorder: Secondary | ICD-10-CM | POA: Diagnosis not present

## 2017-12-02 ENCOUNTER — Other Ambulatory Visit: Payer: Self-pay

## 2017-12-14 ENCOUNTER — Ambulatory Visit (INDEPENDENT_AMBULATORY_CARE_PROVIDER_SITE_OTHER): Payer: Medicare Other | Admitting: Psychology

## 2017-12-14 DIAGNOSIS — F341 Dysthymic disorder: Secondary | ICD-10-CM | POA: Diagnosis not present

## 2017-12-14 DIAGNOSIS — F411 Generalized anxiety disorder: Secondary | ICD-10-CM | POA: Diagnosis not present

## 2017-12-15 ENCOUNTER — Encounter: Payer: Self-pay | Admitting: Internal Medicine

## 2017-12-15 ENCOUNTER — Ambulatory Visit (INDEPENDENT_AMBULATORY_CARE_PROVIDER_SITE_OTHER): Payer: Medicare Other | Admitting: Internal Medicine

## 2017-12-15 ENCOUNTER — Other Ambulatory Visit (INDEPENDENT_AMBULATORY_CARE_PROVIDER_SITE_OTHER): Payer: Medicare Other

## 2017-12-15 VITALS — BP 130/78 | HR 78 | Temp 98.0°F | Ht 68.0 in | Wt 177.0 lb

## 2017-12-15 DIAGNOSIS — R55 Syncope and collapse: Secondary | ICD-10-CM

## 2017-12-15 DIAGNOSIS — I2510 Atherosclerotic heart disease of native coronary artery without angina pectoris: Secondary | ICD-10-CM

## 2017-12-15 DIAGNOSIS — F411 Generalized anxiety disorder: Secondary | ICD-10-CM

## 2017-12-15 DIAGNOSIS — E538 Deficiency of other specified B group vitamins: Secondary | ICD-10-CM | POA: Diagnosis not present

## 2017-12-15 DIAGNOSIS — E1159 Type 2 diabetes mellitus with other circulatory complications: Secondary | ICD-10-CM

## 2017-12-15 DIAGNOSIS — I10 Essential (primary) hypertension: Secondary | ICD-10-CM

## 2017-12-15 DIAGNOSIS — E785 Hyperlipidemia, unspecified: Secondary | ICD-10-CM

## 2017-12-15 LAB — URINALYSIS
Hgb urine dipstick: NEGATIVE
Leukocytes, UA: NEGATIVE
Nitrite: NEGATIVE
Specific Gravity, Urine: >=1.03 — AB (ref 1.000–1.030)
TOTAL PROTEIN, URINE-UPE24: NEGATIVE
URINE GLUCOSE: NEGATIVE
Urobilinogen, UA: 0.2 (ref 0.0–1.0)
pH: 5.5 (ref 5.0–8.0)

## 2017-12-15 MED ORDER — LORAZEPAM 1 MG PO TABS: 1.0000 mg | ORAL_TABLET | Freq: Two times a day (BID) | ORAL | 2 refills | Status: DC | PRN

## 2017-12-15 MED ORDER — SILDENAFIL CITRATE 20 MG PO TABS: ORAL_TABLET | ORAL | 5 refills | Status: DC

## 2017-12-15 NOTE — Assessment & Plan Note (Signed)
Labs

## 2017-12-15 NOTE — Assessment & Plan Note (Addendum)
EKG: NSR w/PVCs Labs incl D dimer, troponine Pt refused ambulance/ER - risks discussed incl death No driving Cardiology ref ASAP tomorrow He agreed to call 911 if not feeling well at home tonight. His dtr is staying w/him

## 2017-12-15 NOTE — Assessment & Plan Note (Signed)
Card ref - tomorrow

## 2017-12-15 NOTE — Patient Instructions (Signed)
Call 911 if weak, lightheaded

## 2017-12-15 NOTE — Progress Notes (Signed)
Subjective:  Patient ID: Jared Alvarez, male    DOB: June 17, 1950  Age: 67 y.o. MRN: [id number]  CC: No chief complaint on file.   HPI Jared Alvarez presents for a passing out spell when out of the hot tub 2 weeks ago. There was another syncope in the kitchen 4 weeks ago. Today he  was scrubbing his patio and was pressure washing - felt faint. No CP, SOB, jerking etc  Outpatient Medications Prior to Visit  Medication Sig Dispense Refill  . acetaminophen (TYLENOL) 500 MG tablet Take 1 tablet (500 mg total) by mouth every 6 (six) hours as needed. 30 tablet 2  . amLODipine (NORVASC) 5 MG tablet Take 1 tablet (5 mg total) by mouth daily. Annual appt due in Oct must see provider for future refills 90 tablet 1  . Ascorbic Acid (VITAMIN C) 1000 MG tablet Take 1 tablet (1,000 mg total) by mouth daily. 1000 tablet 2  . aspirin 81 MG tablet Take 1 tablet (81 mg total) by mouth daily. 30 tablet 2  . Cholecalciferol (VITAMIN D3) 2000 units capsule Take 1 capsule (2,000 Units total) by mouth daily. 100 capsule 2  . colchicine 0.6 MG tablet Take 1 tablet (0.6 mg total) by mouth as needed. If patient has flare up with gout 30 tablet 5  . Cyanocobalamin (VITAMIN B-12) 500 MCG SUBL Place 1 tablet (500 mcg total) under the tongue daily. 150 tablet 2  . FLUoxetine (PROZAC) 40 MG capsule Take 1 capsule (40 mg total) by mouth daily. Annual appt due in Oct must see provider for future refills 90 capsule 1  . glucose blood (ONETOUCH VERIO) test strip Use as instructed 50 each 2  . LORazepam (ATIVAN) 1 MG tablet Take 1 tablet (1 mg total) by mouth 2 (two) times daily as needed for anxiety. 60 tablet 2  . metFORMIN (GLUCOPHAGE) 1000 MG tablet Take 1 tablet (1,000 mg total) by mouth 2 (two) times daily with a meal. 180 tablet 1  . ONETOUCH DELICA LANCETS FINE MISC 1 Device by Does not apply route daily as needed. 100 each 3  . pantoprazole (PROTONIX) 40 MG tablet Take 1 tablet (40 mg total) by mouth 2 (two)  times daily. Annual appt due in Oct must see provider for future refills 180 tablet 1  . ranitidine (ZANTAC) 150 MG/10ML syrup Take 10 mLs (150 mg total) by mouth as needed for heartburn. 300 mL 2  . rosuvastatin (CRESTOR) 40 MG tablet Take 1 tablet (40 mg total) by mouth daily. Annual appt due in Oct must see provider for future refills 90 tablet 1  . sildenafil (REVATIO) 20 MG tablet Take 1-5 (20-100mg ) tablets by mouth daily as needed 60 tablet 5  . traMADol (ULTRAM) 50 MG tablet Take 1 tablet (50 mg total) by mouth every 6 (six) hours as needed for severe pain. 120 tablet 2  . losartan (COZAAR) 50 MG tablet Take 1 tablet (50 mg total) by mouth daily. Annual appt due in Oct must see provider for future refills 90 tablet 1  . nitroGLYCERIN (NITROSTAT) 0.4 MG SL tablet Place 1 tablet (0.4 mg total) under the tongue every 5 (five) minutes as needed for chest pain. 25 tablet 3   No facility-administered medications prior to visit.     ROS: Review of Systems  Constitutional: Negative for appetite change, fatigue and unexpected weight change.  HENT: Negative for congestion, nosebleeds, sneezing, sore throat and trouble swallowing.   Eyes: Negative for itching and  visual disturbance.  Respiratory: Negative for cough.   Cardiovascular: Negative for chest pain, palpitations and leg swelling.  Gastrointestinal: Negative for abdominal distention, blood in stool, diarrhea and nausea.  Genitourinary: Negative for frequency and hematuria.  Musculoskeletal: Negative for back pain, gait problem, joint swelling and neck pain.  Skin: Negative for rash.  Neurological: Positive for syncope. Negative for dizziness, tremors, speech difficulty and weakness.  Psychiatric/Behavioral: Negative for agitation, dysphoric mood and sleep disturbance. The patient is not nervous/anxious.     Objective:  BP 134/72 (BP Location: Left Arm, Patient Position: Sitting, Cuff Size: Normal)   Pulse 78   Temp 98 F (36.7 C)  (Oral)   Ht 5\' 8"  (1.727 m)   Wt 177 lb (80.3 kg)   SpO2 97%   BMI 26.91 kg/m   BP Readings from Last 3 Encounters:  12/15/17 134/72  07/01/17 (!) 141/82  06/22/17 124/72    Wt Readings from Last 3 Encounters:  12/15/17 177 lb (80.3 kg)  07/01/17 180 lb (81.6 kg)  06/22/17 181 lb (82.1 kg)    Physical Exam  Constitutional: He is oriented to person, place, and time. He appears well-developed. No distress.  NAD  HENT:  Mouth/Throat: Oropharynx is clear and moist.  Eyes: Pupils are equal, round, and reactive to light. Conjunctivae are normal.  Neck: Normal range of motion. No JVD present. No thyromegaly present.  Cardiovascular: Normal rate, regular rhythm, normal heart sounds and intact distal pulses. Exam reveals no gallop and no friction rub.  No murmur heard. Pulmonary/Chest: Effort normal and breath sounds normal. No respiratory distress. He has no wheezes. He has no rales. He exhibits no tenderness.  Abdominal: Soft. Bowel sounds are normal. He exhibits no distension and no mass. There is no tenderness. There is no rebound and no guarding.  Musculoskeletal: Normal range of motion. He exhibits no edema or tenderness.  Lymphadenopathy:    He has no cervical adenopathy.  Neurological: He is alert and oriented to person, place, and time. He has normal reflexes. No cranial nerve deficit. He exhibits normal muscle tone. He displays a negative Romberg sign. Coordination and gait normal.  Skin: Skin is warm and dry. No rash noted.  Psychiatric: He has a normal mood and affect. His behavior is normal. Judgment and thought content normal.  occ irreg beats No orthostatic BP drop noted   Procedure: EKG Indication: syncope Impression: NSR w/PVCs. No acute changes.  Lab Results  Component Value Date   WBC 5.2 09/22/2016   HGB 14.4 09/22/2016   HCT 42.1 09/22/2016   PLT 292.0 09/22/2016   GLUCOSE 133 (H) 04/29/2017   CHOL 159 04/29/2017   TRIG 85.0 04/29/2017   HDL 68.80  04/29/2017   LDLDIRECT 149.6 12/15/2011   LDLCALC 73 04/29/2017   ALT 14 04/29/2017   AST 16 04/29/2017   NA 138 04/29/2017   K 4.4 04/29/2017   CL 100 04/29/2017   CREATININE 0.93 04/29/2017   BUN 20 04/29/2017   CO2 29 04/29/2017   TSH 2.19 04/29/2017   PSA 0.64 09/22/2016   INR 0.98 06/03/2010   HGBA1C 6.7 (H) 04/29/2017   MICROALBUR <0.7 04/29/2017    No results found.  Assessment & Plan:   There are no diagnoses linked to this encounter.   No orders of the defined types were placed in this encounter.    Follow-up: No follow-ups on file.  Walker Kehr, MD

## 2017-12-15 NOTE — Assessment & Plan Note (Signed)
B 12

## 2017-12-15 NOTE — Assessment & Plan Note (Signed)
Lorazepam prn 

## 2017-12-15 NOTE — Assessment & Plan Note (Signed)
Amlodipine, Losartan 

## 2017-12-16 ENCOUNTER — Other Ambulatory Visit: Payer: Self-pay | Admitting: Medical

## 2017-12-16 ENCOUNTER — Encounter (HOSPITAL_COMMUNITY): Payer: Self-pay | Admitting: Emergency Medicine

## 2017-12-16 ENCOUNTER — Telehealth: Payer: Self-pay | Admitting: Cardiovascular Disease

## 2017-12-16 ENCOUNTER — Emergency Department (HOSPITAL_COMMUNITY): Payer: Medicare Other

## 2017-12-16 ENCOUNTER — Emergency Department (HOSPITAL_COMMUNITY)
Admission: EM | Admit: 2017-12-16 | Discharge: 2017-12-16 | Disposition: A | Discharge status: Home / Self Care | Payer: Medicare Other | Attending: Emergency Medicine | Admitting: Emergency Medicine

## 2017-12-16 DIAGNOSIS — I712 Thoracic aortic aneurysm, without rupture: Secondary | ICD-10-CM

## 2017-12-16 DIAGNOSIS — Z85828 Personal history of other malignant neoplasm of skin: Secondary | ICD-10-CM | POA: Insufficient documentation

## 2017-12-16 DIAGNOSIS — I2510 Atherosclerotic heart disease of native coronary artery without angina pectoris: Secondary | ICD-10-CM | POA: Diagnosis not present

## 2017-12-16 DIAGNOSIS — R55 Syncope and collapse: Secondary | ICD-10-CM | POA: Insufficient documentation

## 2017-12-16 DIAGNOSIS — I7121 Aneurysm of the ascending aorta, without rupture: Secondary | ICD-10-CM | POA: Insufficient documentation

## 2017-12-16 DIAGNOSIS — R001 Bradycardia, unspecified: Secondary | ICD-10-CM | POA: Diagnosis not present

## 2017-12-16 DIAGNOSIS — R05 Cough: Secondary | ICD-10-CM | POA: Diagnosis not present

## 2017-12-16 DIAGNOSIS — Z7982 Long term (current) use of aspirin: Secondary | ICD-10-CM | POA: Diagnosis not present

## 2017-12-16 DIAGNOSIS — I10 Essential (primary) hypertension: Secondary | ICD-10-CM | POA: Diagnosis not present

## 2017-12-16 DIAGNOSIS — R42 Dizziness and giddiness: Secondary | ICD-10-CM

## 2017-12-16 DIAGNOSIS — Z955 Presence of coronary angioplasty implant and graft: Secondary | ICD-10-CM | POA: Insufficient documentation

## 2017-12-16 DIAGNOSIS — Z7984 Long term (current) use of oral hypoglycemic drugs: Secondary | ICD-10-CM | POA: Diagnosis not present

## 2017-12-16 DIAGNOSIS — E119 Type 2 diabetes mellitus without complications: Secondary | ICD-10-CM | POA: Diagnosis not present

## 2017-12-16 LAB — CBC WITH DIFFERENTIAL/PLATELET
BASOS PCT: 0.3 % (ref 0.0–3.0)
Basophils Absolute: 0 10*3/uL (ref 0.0–0.1)
Eosinophils Absolute: 0 10*3/uL (ref 0.0–0.7)
Eosinophils Relative: 0.3 % (ref 0.0–5.0)
HEMATOCRIT: 38.8 % — AB (ref 39.0–52.0)
Hemoglobin: 13.3 g/dL (ref 13.0–17.0)
LYMPHS PCT: 12 % (ref 12.0–46.0)
Lymphs Abs: 1 10*3/uL (ref 0.7–4.0)
MCHC: 34.2 g/dL (ref 30.0–36.0)
MCV: 94.7 fl (ref 78.0–100.0)
Monocytes Absolute: 0.8 10*3/uL (ref 0.1–1.0)
Monocytes Relative: 9.8 % (ref 3.0–12.0)
NEUTROS ABS: 6.3 10*3/uL (ref 1.4–7.7)
Neutrophils Relative %: 77.6 % — ABNORMAL HIGH (ref 43.0–77.0)
PLATELETS: 261 10*3/uL (ref 150.0–400.0)
RBC: 4.09 Mil/uL — ABNORMAL LOW (ref 4.22–5.81)
RDW: 14.2 % (ref 11.5–15.5)
WBC: 8.1 10*3/uL (ref 4.0–10.5)

## 2017-12-16 LAB — BASIC METABOLIC PANEL
ANION GAP: 9 (ref 5–15)
BUN: 35 mg/dL — ABNORMAL HIGH (ref 6–23)
BUN: 22 mg/dL (ref 8–23)
CALCIUM: 9.8 mg/dL (ref 8.9–10.3)
CHLORIDE: 101 meq/L (ref 96–112)
CO2: 22 meq/L (ref 19–32)
CO2: 26 mmol/L (ref 22–32)
Calcium: 9.8 mg/dL (ref 8.4–10.5)
Chloride: 102 mmol/L (ref 98–111)
Creatinine, Ser: 1.39 mg/dL (ref 0.40–1.50)
Creatinine, Ser: 1.02 mg/dL (ref 0.61–1.24)
GFR: 54.15 mL/min — ABNORMAL LOW (ref >60.00)
Glucose, Bld: 127 mg/dL — ABNORMAL HIGH (ref 70–99)
Glucose, Bld: 169 mg/dL — ABNORMAL HIGH (ref 70–99)
Potassium: 5 mEq/L (ref 3.5–5.1)
Potassium: 4.1 mmol/L (ref 3.5–5.1)
SODIUM: 136 meq/L (ref 135–145)
Sodium: 137 mmol/L (ref 135–145)

## 2017-12-16 LAB — I-STAT TROPONIN, ED
Troponin i, poc: 0.01 ng/mL (ref 0.00–0.08)
Troponin i, poc: 0.01 ng/mL (ref 0.00–0.08)

## 2017-12-16 LAB — VITAMIN B12: VITAMIN B 12: 913 pg/mL — AB (ref 211–911)

## 2017-12-16 LAB — CBC
HCT: 41.2 % (ref 39.0–52.0)
HEMOGLOBIN: 13.7 g/dL (ref 13.0–17.0)
MCH: 31.6 pg (ref 26.0–34.0)
MCHC: 33.3 g/dL (ref 30.0–36.0)
MCV: 94.9 fL (ref 78.0–100.0)
Platelets: 257 10*3/uL (ref 150–400)
RBC: 4.34 MIL/uL (ref 4.22–5.81)
RDW: 13 % (ref 11.5–15.5)
WBC: 6.2 10*3/uL (ref 4.0–10.5)

## 2017-12-16 LAB — TSH: TSH: 1.01 u[IU]/mL (ref 0.35–4.50)

## 2017-12-16 LAB — TROPONIN I: TNIDX: 0.01 ug/L (ref 0.00–0.06)

## 2017-12-16 LAB — HEMOGLOBIN A1C: HEMOGLOBIN A1C: 6.7 % — AB (ref 4.6–6.5)

## 2017-12-16 LAB — D-DIMER, QUANTITATIVE: D-Dimer, Quant: 0.78 mcg/mL FEU — ABNORMAL HIGH (ref <0.50)

## 2017-12-16 MED ORDER — IOPAMIDOL (ISOVUE-370) INJECTION 76%
INTRAVENOUS | Status: AC
  Filled 2017-12-16: qty 100

## 2017-12-16 MED ORDER — LOSARTAN POTASSIUM 100 MG PO TABS: 100.0000 mg | ORAL_TABLET | Freq: Every day | ORAL | 3 refills | Status: DC

## 2017-12-16 MED ORDER — IOPAMIDOL (ISOVUE-370) INJECTION 76%
100.0000 mL | Freq: Once | INTRAVENOUS | Status: AC | PRN
  Administered 2017-12-16: 100 mL via INTRAVENOUS

## 2017-12-16 MED ORDER — LIDOCAINE HCL 1 % IJ SOLN
INTRAMUSCULAR | Status: AC
  Filled 2017-12-16: qty 20

## 2017-12-16 NOTE — Telephone Encounter (Signed)
Received call directly from [facility] at Dr. Judeen Hammans office regarding patient who was seen in the office yesterday for syncope. Stanton Kidney states this has occurred x 3 and Dr. Alain Marion wants patient seen by cardiology today. I reviewed patient's chart and he is a patient of Dr. Johnsie Cancel. I advised that Dr. Johnsie Cancel is not in the office today and that I will need to discuss with our DOD, Dr. Saunders Revel. I advised Stanton Kidney that our office will call the patient directly to schedule an appointment. BP and HR were within normal parameters at patient's ov yesterday. EKG from yesterday shows NSR with frequent PVCs.

## 2017-12-16 NOTE — ED Provider Notes (Signed)
[facility] EMERGENCY DEPARTMENT Provider Note   CSN: [id number] Arrival date & time: 12/16/17  1047     History   Chief Complaint Chief Complaint  Patient presents with  . Dizziness    HPI Jared Alvarez is a 67 y.o. male with hypertension, diabetes, and hyperlipidemia who presents at the request of his PCP due to an elevated d-dimer.  He syncopized while in a hot tub 2 weeks ago.  Prior to this, he syncopized in the kitchen 2 weeks prior.  Yesterday, he says that he was working on his patio and pressure washing and felt lightheaded and weak.  He attributed this to the heat.  He was evaluated by his PCP, who obtained an ECG and found PVCs and a d-dimer, which was elevated.  Currently, the patient reports feeling well.  He says his episodes are not associated with any chest pain or shortness of breath.  He has no history of VTE or coronary disease.  He denies sensory changes and motor weakness.  There is no known family history of early cardiac death or MI.  HPI  Past Medical History:  Diagnosis Date  . Allergy   . Anxiety    no per pt  . Coronary artery disease   . Depression    no per pt  . DM type 2 (diabetes mellitus, type 2) ([place])   . GERD (gastroesophageal reflux disease)   . Gout   . Hearing loss   . Hyperlipidemia   . Hypertension   . Hypogonadism male   . Inguinal hernia    right  . LBP (low back pain)   . Leg cramps   . Osteoarthritis   . Skin neoplasm   . Tinnitus   . Vertigo   . Vitamin B12 deficiency     Patient Active Problem List   Diagnosis Date Noted  . Syncopal episodes 12/15/2017  . Chronic pain of left thumb 07/02/2017  . Foot pain, right 09/21/2016  . Insomnia 06/02/2016  . Flu-like symptoms 06/07/2015  . Cough 11/06/2014  . Erectile dysfunction 09/28/2013  . GERD (gastroesophageal reflux disease) 09/28/2013  . Depression 08/08/2013  . Actinic keratoses 08/08/2013  . Ganglion of left wrist 09/30/2012  . Urethral  lesion 05/19/2012  . Constipation 04/11/2012  . Decreased hearing 12/15/2011  . Chest pain 01/29/2011  . Palpitations 01/29/2011  . Snoring 01/29/2011  . URI 05/22/2010  . HYPOGONADISM 03/24/2010  . INGUINAL HERNIA, UNILATERAL 03/24/2010  . KNEE PAIN 03/24/2010  . B12 deficiency 02/10/2010  . FATIGUE 02/10/2010  . HEARING LOSS, MILD 12/02/2009  . NEOPLASM OF UNCERTAIN BEHAVIOR OF SKIN 05/10/2009  . Tinnitus 03/22/2009  . VERTIGO 03/22/2009  . LOW BACK PAIN 01/15/2009  . DM type 2 causing vascular disease ([place]) 12/21/2008  . Dyslipidemia 12/21/2008  . GOUT 12/21/2008  . Anxiety state 12/21/2008  . Adjustment disorder with mixed anxiety and depressed mood 12/21/2008  . Essential hypertension 12/21/2008  . Coronary atherosclerosis 12/21/2008  . OSTEOARTHROSIS UNSPEC WHETHER GEN/LOC LOWER LEG 12/21/2008  . LEG CRAMPS 12/21/2008    Past Surgical History:  Procedure Laterality Date  . heart stent     in 1997  . INGUINAL HERNIA REPAIR          Home Medications    Prior to Admission medications   Medication Sig Start Date End Date Taking? Authorizing Provider  acetaminophen (TYLENOL) 500 MG tablet Take 1 tablet (500 mg total) by mouth every 6 (six) hours as needed. Patient  taking differently: Take 500 mg by mouth every 6 (six) hours as needed for mild pain.  06/23/17  Yes Plotnikov, Evie Lacks, MD  amLODipine (NORVASC) 5 MG tablet Take 1 tablet (5 mg total) by mouth daily. Annual appt due in Oct must see provider for future refills Patient taking differently: Take 5 mg by mouth daily.  09/06/17  Yes Plotnikov, Evie Lacks, MD  aspirin 81 MG tablet Take 1 tablet (81 mg total) by mouth daily. 06/23/17  Yes Plotnikov, Evie Lacks, MD  Cholecalciferol (VITAMIN D3) 2000 units capsule Take 1 capsule (2,000 Units total) by mouth daily. 06/23/17  Yes Plotnikov, Evie Lacks, MD  Cyanocobalamin (VITAMIN B-12) 500 MCG SUBL Place 1 tablet (500 mcg total) under the tongue daily. 06/23/17  Yes  Plotnikov, Evie Lacks, MD  Homeopathic Products (ARNICARE [place]NICA) OINT Apply 1 application topically 2 (two) times daily as needed (arthritis).   Yes [provider]  LORazepam (ATIVAN) 1 MG tablet Take 1 tablet (1 mg total) by mouth 2 (two) times daily as needed for anxiety. Patient taking differently: Take 0.5 mg by mouth at bedtime.  12/15/17  Yes Plotnikov, Evie Lacks, MD  losartan (COZAAR) 50 MG tablet Take 1 tablet (50 mg total) by mouth daily. Annual appt due in Oct must see provider for future refills Patient taking differently: Take 50 mg by mouth daily.  09/06/17 12/16/17 Yes Plotnikov, Evie Lacks, MD  metFORMIN (GLUCOPHAGE) 1000 MG tablet Take 1 tablet (1,000 mg total) by mouth 2 (two) times daily with a meal. 09/06/17  Yes Plotnikov, Evie Lacks, MD  nitroGLYCERIN (NITROSTAT) 0.4 MG SL tablet Place 1 tablet (0.4 mg total) under the tongue every 5 (five) minutes as needed for chest pain. 06/23/17 12/16/17 Yes Plotnikov, Evie Lacks, MD  pantoprazole (PROTONIX) 40 MG tablet Take 1 tablet (40 mg total) by mouth 2 (two) times daily. Annual appt due in Oct must see provider for future refills Patient taking differently: Take 40 mg by mouth 2 (two) times daily as needed (acid reflux).  09/06/17  Yes Plotnikov, Evie Lacks, MD  rosuvastatin (CRESTOR) 40 MG tablet Take 1 tablet (40 mg total) by mouth daily. Annual appt due in Oct must see provider for future refills Patient taking differently: Take 40 mg by mouth daily.  09/06/17  Yes Plotnikov, Evie Lacks, MD  sildenafil (REVATIO) 20 MG tablet Take 1-5 (20-100mg ) tablets by mouth daily as needed Patient taking differently: Take 20-100 mg by mouth as needed (For erectile dysfunction.).  12/15/17  Yes Plotnikov, Evie Lacks, MD  traMADol (ULTRAM) 50 MG tablet Take 1 tablet (50 mg total) by mouth every 6 (six) hours as needed for severe pain. 06/23/17  Yes Plotnikov, Evie Lacks, MD  Ascorbic Acid (VITAMIN C) 1000 MG tablet Take 1 tablet (1,000 mg total) by mouth  daily. Patient not taking: Reported on 12/16/2017 06/23/17   Plotnikov, Evie Lacks, MD  colchicine 0.6 MG tablet Take 1 tablet (0.6 mg total) by mouth as needed. If patient has flare up with gout 09/21/16   Plotnikov, Evie Lacks, MD  FLUoxetine (PROZAC) 40 MG capsule Take 1 capsule (40 mg total) by mouth daily. Annual appt due in Oct must see provider for future refills Patient not taking: Reported on 12/16/2017 09/06/17   Plotnikov, Evie Lacks, MD  glucose blood (ONETOUCH VERIO) test strip Use as instructed 06/23/17   Plotnikov, Evie Lacks, MD  [facility] DELICA LANCETS FINE MISC 1 Device by Does not apply route daily as needed. 06/23/17   Plotnikov,  Evie Lacks, MD  ranitidine (ZANTAC) 150 MG/10ML syrup Take 10 mLs (150 mg total) by mouth as needed for heartburn. Patient not taking: Reported on 12/16/2017 06/23/17   Plotnikov, Evie Lacks, MD    Family History Family History  Problem Relation Age of Onset  . Heart disease Father   . Arthritis Sister   . Heart disease Brother   . Coronary artery disease Other   . Hypertension Other   . Lupus Sister   . Colon cancer Neg Hx   . Esophageal cancer Neg Hx   . Stomach cancer Neg Hx   . Rectal cancer Neg Hx     Social History Social History   Tobacco Use  . Smoking status: Never Smoker  . Smokeless tobacco: Never Used  Substance Use Topics  . Alcohol use: Yes    Alcohol/week: 7.0 standard drinks    Types: 7 Glasses of wine per week  . Drug use: No     Allergies   Atorvastatin; Bupropion hcl; Duloxetine; Nexium [esomeprazole magnesium]; Simvastatin; Venlafaxine; Viibryd [vilazodone hcl]; Ketorolac; and Tamsulosin   Review of Systems Review of Systems Review of Systems   Constitutional  Negative for fever  Negative for chills  HENT  Negative for ear pain  Negative for sore throat  Negative for difficultly swallowing  Eyes  Negative for eye pain  Negative for visual disturbance  Respiratory  Negative for shortness of  breath  Negative for cough  CV  Negative for chest pain  Negative for leg swelling  Abdomen  Negative for abdominal pain  Negative for nausea  Negative for vomiting  MSK  Negative for extremity pain  Negative for back pain  Skin  Negative for rash  Negative for wound  Neuro  +for syncope  Negative for difficultly speaking  Psych  Negative for confusion   The remainder of the ROS was reviewed and negative except as documented above.      Physical Exam Updated Vital Signs BP 135/69   Pulse (!) 47   Temp (!) 97.5 F (36.4 C) (Oral)   Resp 17   SpO2 97%   Physical Exam Physical Exam Constitutional  Nursing notes reviewed  Vital signs reviewed  HEENT  No obvious trauma  Supple without meningismus, mass, or overt JVD  EOMI  No scleral icterus or injection  Respiratory  Effort normal  CTAB  No respiratory distress  CV  Bradycardic  No obvious murmurs  No pitting edema  Equal pulses in all extremities  Chest not tender to palpation  Abdomen  Soft  Non-tender  Non-distended  No peritonitis  MSK  Atraumatic  No obvious deformity  ROM appropriate  Skin  Warm  Dry  Neuro  Awake and alert  EOMI  Moving all extremities  Denies numbness/tingling  Can ambulate without difficultly  Negative Romberg  No arm drift  Psychiatric  Mood and affect normal        ED Treatments / Results  Labs (all labs ordered are listed, but only abnormal results are displayed) Labs Reviewed  BASIC METABOLIC PANEL - Abnormal; Notable for the following components:      Result Value   Glucose, Bld 169 (*)    All other components within normal limits  CBC  I-STAT TROPONIN, ED    EKG None  Radiology Ct Angio Chest Pe W And/or Wo Contrast  Result Date: 12/16/2017 CLINICAL DATA:  Syncope. EXAM: CT ANGIOGRAPHY CHEST WITH CONTRAST TECHNIQUE: Multidetector CT imaging of the chest was performed  using the standard protocol during bolus  administration of intravenous contrast. Multiplanar CT image reconstructions and MIPs were obtained to evaluate the vascular anatomy. CONTRAST:  159mL ISOVUE-370 IOPAMIDOL (ISOVUE-370) INJECTION 76% COMPARISON:  Radiographs of June 03, 2010. FINDINGS: Cardiovascular: Satisfactory opacification of the pulmonary arteries to the segmental level. No evidence of pulmonary embolism. Normal heart size. No pericardial effusion. Atherosclerosis of thoracic aorta is noted. 4.2 cm ascending thoracic aortic aneurysm is noted. No pericardial effusion is noted. Coronary artery calcifications are noted. Mediastinum/Nodes: No enlarged mediastinal, hilar, or axillary lymph nodes. Thyroid gland, trachea, and esophagus demonstrate no significant findings. Lungs/Pleura: Lungs are clear. No pleural effusion or pneumothorax. Upper Abdomen: No acute abnormality. Musculoskeletal: No chest wall abnormality. No acute or significant osseous findings. Review of the MIP images confirms the above findings. IMPRESSION: No definite evidence of pulmonary embolus. Coronary artery calcifications are noted suggesting coronary artery disease. 4.2 cm ascending thoracic aortic aneurysm is noted. Recommend annual imaging followup by CTA or MRA. This recommendation follows 2010 ACCF/AHA/AATS/ACR/ASA/SCA/SCAI/SIR/STS/SVM Guidelines for the Diagnosis and Management of Patients with Thoracic Aortic Disease. Circulation. 2010; 121: I778-E423. Aortic Atherosclerosis (ICD10-I70.0). Electronically Signed   By: Marijo Conception, M.D.   On: 12/16/2017 14:45    Procedures Procedures (including critical care time)  Medications Ordered in ED Medications  lidocaine (XYLOCAINE) 1 % (with pres) injection (has no administration in time range)  iopamidol (ISOVUE-370) 76 % injection (has no administration in time range)  iopamidol (ISOVUE-370) 76 % injection 100 mL (100 mLs Intravenous Contrast Given 12/16/17 1423)     Initial Impression / Assessment and Plan  / ED Course  I have reviewed the triage vital signs and the nursing notes.  Pertinent labs & imaging results that were available during my care of the patient were reviewed by me and considered in my medical decision making (see chart for details).  Clinical Course as of Dec 17 1539  Thu Dec 16, 2017  1354 [place] presents due to multiple syncopal episodes and an  elevated d-dimer as per above.Currently, he is hemodynamically stable albeit intermittently bradycardic into the 50s.  He also had one episode of bradycardia into the 40s.  He was asymptomatic during these periods.  The differential for his syncope is broad. It includes cardiac dysrhythmia, valvular disorder, PE, ACS, stroke, seizure, and hypovolemia.  Based off his presentation and physical exam, stroke, seizure, hypokalemia, and valvular disorder are not likely.  There is no appreciable heart murmur.  His ECG reveals no evidence of acute ischemia or dysrhythmia.  There is no Brugada syndrome, LVH, prolonged QTC, ARVC, De Winters waves, or Wellens waves.  His initial troponin is not elevated.  He has no acute abnormalities on BMP or CBC.  CT PE study ordered.   [NA]  5361 The patient's PE study revealed no acute evidence of   [NA]  1507 The PE study revealed no evidence of PE.  It did reveal a 4 cm thoracic aneurysm.  I have paged cardiology to discuss further stratification and management.   [NA]  1540 At time of signout, cardiology recommendations were pending.  Signout was given to Dr. Corrie Dandy.   [NA]    Clinical Course User Index [NA] Alford Highland, MD      Final Clinical Impressions(s) / ED Diagnoses   Final diagnoses:  Syncope, unspecified syncope type  Bradycardia    ED Discharge Orders    None       Alford Highland, MD 12/16/17 [phone]  Julianne Rice, MD 12/17/17 [phone]

## 2017-12-16 NOTE — Telephone Encounter (Signed)
I reviewed request for an appointment and patient's symptoms with Dr. Saunders Revel, DOD. He advised that due to patient's positive D-dimer and syncope, that patient should go to the ED.  I called and left a message for patient to call our office and ask for me.

## 2017-12-16 NOTE — Telephone Encounter (Signed)
Received call from patient and reviewed Dr. Darnelle Bos advice with him. Patient verbalized understanding and agreement and states he has never been to the ED. He says he has a family member to drive him. I gave him directions and advised him that his records are visible in Epic. He thanked me for the call.

## 2017-12-16 NOTE — ED Notes (Signed)
Patient returned from CT

## 2017-12-16 NOTE — ED Notes (Signed)
Pt alert and oriented in NAD. Pt verbalized understanding of discharge instructions. 

## 2017-12-16 NOTE — ED Notes (Signed)
Reported brady into 40's to Dr Lita Mains.

## 2017-12-16 NOTE — Telephone Encounter (Signed)
New message:      Jared Alvarez is calling from Dr. Allie Bossier office about the pt having syncope x3 and CAD. Pt was in the office on yesterday.

## 2017-12-16 NOTE — Discharge Instructions (Addendum)
You have a 4.2 cm ascending thoracic aortic aneurysm is noted. Per radiology, recommend annual imaging followup by CTA or MRA. This recommendation follows 2010 ACCF/AHA/AATS/ACR/ASA/SCA/SCAI/SIR/STS/SVM Guidelines for the Diagnosis and Management of Patients with Thoracic Aortic Disease. Circulation. 2010; 121: Z563-O756. (PLEASE SHARE THIS WITH YOUR PRIMARY CARE DOCTOR)  Cardiac Event Monitoring A cardiac event monitor is a small recording device that is used to detect abnormal heart rhythms (arrhythmias). The monitor is used to record your heart rhythm when you have symptoms, such as: Fast heartbeats (palpitations), such as heart racing or fluttering. Dizziness. Fainting or light-headedness. Unexplained weakness.  Some monitors are wired to electrodes placed on your chest. Electrodes are flat, sticky disks that attach to your skin. Other monitors may be hand-held or worn on the wrist. The monitor can be worn for up to 30 days. If the monitor is attached to your chest, a technician will prepare your chest for the electrode placement and show you how to work the monitor. Take time to practice using the monitor before you leave the office. Make sure you understand how to send the information from the monitor to your health care provider. In some cases, you may need to use a landline telephone instead of a cell phone. What are the risks? Generally, this device is safe to use, but it possible that the skin under the electrodes will become irritated. How to use your cardiac event monitor Wear your monitor at all times, except when you are in water: Do not let the monitor get wet. Take the monitor off when you bathe. Do not swim or use a hot tub with it on. Keep your skin clean. Do not put body lotion or moisturizer on your chest. Change the electrodes as told by your health care provider or any time they stop sticking to your skin. You may need to use medical tape to keep them on. Try to put the  electrodes in slightly different places on your chest to help prevent skin irritation. They must remain in the area under your left breast and in the upper right section of your chest. Make sure the monitor is safely clipped to your clothing or in a location close to your body that your health care provider recommends. Press the button to record as soon as you feel heart-related symptoms, such as: Dizziness. Weakness. Light-headedness. Palpitations. Thumping or pounding in your chest. Shortness of breath. Unexplained weakness. Keep a diary of your activities, such as walking, doing chores, and taking medicine. It is very important to note what you were doing when you pushed the button to record your symptoms. This will help your health care provider determine what might be contributing to your symptoms. Send the recorded information as recommended by your health care provider. It may take some time for your health care provider to process the results. Change the batteries as told by your health care provider. Keep electronic devices away from your monitor. This includes: Tablets. MP3 players. Cell phones. While wearing your monitor you should avoid: Electric blankets. Armed forces operational officer. Electric toothbrushes. Microwave ovens. Magnets. Metal detectors. Get help right away if: You have chest pain. You have extreme difficulty breathing or shortness of breath. You develop a very fast heartbeat that persists. You develop dizziness that does not go away. You faint or constantly feel like you are about to faint. Summary A cardiac event monitor is a small recording device that is used to help detect abnormal heart rhythms (arrhythmias). The monitor is used to  record your heart rhythm when you have heart-related symptoms. Make sure you understand how to send the information from the monitor to your health care provider. It is important to press the button on the monitor when you have any  heart-related symptoms. Keep a diary of your activities, such as walking, doing chores, and taking medicine. It is very important to note what you were doing when you pushed the button to record your symptoms. This will help your health care provider learn what might be causing your symptoms. This information is not intended to replace advice given to you by your health care provider. Make sure you discuss any questions you have with your health care provider. Document Released: 01/28/2008 Document Revised: 04/04/2016 Document Reviewed: 04/04/2016 Elsevier Interactive Patient Education  2017 [employer].

## 2017-12-16 NOTE — Consult Note (Addendum)
Cardiology Consult:   Patient ID: Jared Alvarez; MRN: [id number]; DOB: [birth year]   Admission date: 12/16/2017  Primary Care Provider: Cassandria Anger, MD Primary Cardiologist: Jenkins Rouge, MD  Primary Electrophysiologist:  None  Chief Complaint:  Syncope  Patient Profile:   Jared Alvarez is a 67 y.o. male with a history of CAD s/p stent to LAD in 1997 ([place]), HTN, HLD, DM type 2, gout, depression/anxiety, who presented for evaluation of syncopal episode yesterday. Cardiology asked to evaluate by ED.  History of Present Illness:   Jared Alvarez had his first syncopal episode a few weeks ago after getting out of a hot tub. He states he was in his usual state of health until yesterday morning when he had was working outside, power washing his deck and moving heavy things, when he felt sudden onset dizziness and nausea with subsequent vomiting but did not lose consciousness. He reported not eating anything prior to working outdoors yesterday morning. He denied associated chest pain or SOB surrounding either episode. He denied seizure like activity with either episode. He was seen by his PCP office yesterday and was recommended to follow-up with cardiology ASAP. Given lack of available appointments, Dr. Saunders Revel recommended that he present to the ED for further evaluation.  He was last seen outpatient by Dr. Johnsie Cancel 01/2017 and was thought to be doing well. He noted twinges of pain lasting for seconds but no significant chest pain. His medications were continued with the exception of valsartan>cozaar given recall. His last ischemic evaluation was a NST in 2017 and was felt to be low risk.   At the time of this evaluation he is feeling in his usual state of health and is hopeful to go home today. He reports exercising routinely and denies anginal complaints. He denies recent fever, URI, orthopnea, PND, or LE edema.   ED course: bradycardic to the 40s, mildly hypertensive, otherwise VSS. Labs  notable for electrolytes wnl, Cr 1.02 (was 1.39 yesterday), Hgb 13.7, PLT 257, Trop negative x1. Labs from PCP yesterday with ddimer 0.78, A1C 6.7, TSH wnl. EKG today with sinus rhythm with PVC without STE/D. CT chest without PE but reveals CAD and a 4.2cm ascending thoracic aortic aneurysm. Cardiology asked to evaluate for syncope   Past Medical History:  Diagnosis Date  . Allergy   . Anxiety    no per pt  . Coronary artery disease   . Depression    no per pt  . DM type 2 (diabetes mellitus, type 2) ([place])   . GERD (gastroesophageal reflux disease)   . Gout   . Hearing loss   . Hyperlipidemia   . Hypertension   . Hypogonadism male   . Inguinal hernia    right  . LBP (low back pain)   . Leg cramps   . Osteoarthritis   . Skin neoplasm   . Tinnitus   . Vertigo   . Vitamin B12 deficiency     Past Surgical History:  Procedure Laterality Date  . heart stent     in 1997  . INGUINAL HERNIA REPAIR       Medications Prior to Admission: Prior to Admission medications   Medication Sig Start Date End Date Taking? Authorizing Provider  acetaminophen (TYLENOL) 500 MG tablet Take 1 tablet (500 mg total) by mouth every 6 (six) hours as needed. Patient taking differently: Take 500 mg by mouth every 6 (six) hours as needed for mild pain.  06/23/17  Yes Plotnikov, Evie Lacks, MD  amLODipine (NORVASC) 5 MG tablet Take 1 tablet (5 mg total) by mouth daily. Annual appt due in Oct must see provider for future refills Patient taking differently: Take 5 mg by mouth daily.  09/06/17  Yes Plotnikov, Evie Lacks, MD  aspirin 81 MG tablet Take 1 tablet (81 mg total) by mouth daily. 06/23/17  Yes Plotnikov, Evie Lacks, MD  Cholecalciferol (VITAMIN D3) 2000 units capsule Take 1 capsule (2,000 Units total) by mouth daily. 06/23/17  Yes Plotnikov, Evie Lacks, MD  Cyanocobalamin (VITAMIN B-12) 500 MCG SUBL Place 1 tablet (500 mcg total) under the tongue daily. 06/23/17  Yes Plotnikov, Evie Lacks, MD  Homeopathic  Products (ARNICARE ARNICA) OINT Apply 1 application topically 2 (two) times daily as needed (arthritis).   Yes [provider]  LORazepam (ATIVAN) 1 MG tablet Take 1 tablet (1 mg total) by mouth 2 (two) times daily as needed for anxiety. Patient taking differently: Take 0.5 mg by mouth at bedtime.  12/15/17  Yes Plotnikov, Evie Lacks, MD  losartan (COZAAR) 50 MG tablet Take 1 tablet (50 mg total) by mouth daily. Annual appt due in Oct must see provider for future refills Patient taking differently: Take 50 mg by mouth daily.  09/06/17 12/16/17 Yes Plotnikov, Evie Lacks, MD  metFORMIN (GLUCOPHAGE) 1000 MG tablet Take 1 tablet (1,000 mg total) by mouth 2 (two) times daily with a meal. 09/06/17  Yes Plotnikov, Evie Lacks, MD  nitroGLYCERIN (NITROSTAT) 0.4 MG SL tablet Place 1 tablet (0.4 mg total) under the tongue every 5 (five) minutes as needed for chest pain. 06/23/17 12/16/17 Yes Plotnikov, Evie Lacks, MD  pantoprazole (PROTONIX) 40 MG tablet Take 1 tablet (40 mg total) by mouth 2 (two) times daily. Annual appt due in Oct must see provider for future refills Patient taking differently: Take 40 mg by mouth 2 (two) times daily as needed (acid reflux).  09/06/17  Yes Plotnikov, Evie Lacks, MD  rosuvastatin (CRESTOR) 40 MG tablet Take 1 tablet (40 mg total) by mouth daily. Annual appt due in Oct must see provider for future refills Patient taking differently: Take 40 mg by mouth daily.  09/06/17  Yes Plotnikov, Evie Lacks, MD  sildenafil (REVATIO) 20 MG tablet Take 1-5 (20-100mg ) tablets by mouth daily as needed Patient taking differently: Take 20-100 mg by mouth as needed (For erectile dysfunction.).  12/15/17  Yes Plotnikov, Evie Lacks, MD  traMADol (ULTRAM) 50 MG tablet Take 1 tablet (50 mg total) by mouth every 6 (six) hours as needed for severe pain. 06/23/17  Yes Plotnikov, Evie Lacks, MD  Ascorbic Acid (VITAMIN C) 1000 MG tablet Take 1 tablet (1,000 mg total) by mouth daily. Patient not taking: Reported on  12/16/2017 06/23/17   Plotnikov, Evie Lacks, MD  colchicine 0.6 MG tablet Take 1 tablet (0.6 mg total) by mouth as needed. If patient has flare up with gout 09/21/16   Plotnikov, Evie Lacks, MD  FLUoxetine (PROZAC) 40 MG capsule Take 1 capsule (40 mg total) by mouth daily. Annual appt due in Oct must see provider for future refills Patient not taking: Reported on 12/16/2017 09/06/17   Plotnikov, Evie Lacks, MD  glucose blood (ONETOUCH VERIO) test strip Use as instructed 06/23/17   Plotnikov, Evie Lacks, MD  [facility] DELICA LANCETS FINE MISC 1 Device by Does not apply route daily as needed. 06/23/17   Plotnikov, Evie Lacks, MD  ranitidine (ZANTAC) 150 MG/10ML syrup Take 10 mLs (150 mg total) by mouth as needed for heartburn. Patient not taking: Reported  on 12/16/2017 06/23/17   Plotnikov, Evie Lacks, MD     Allergies:    Allergies  Allergen Reactions  . Atorvastatin     REACTION: pains  . Bupropion Hcl     REACTION: side effects  . Duloxetine     REACTION: dizzy  . Nexium [Esomeprazole Magnesium]     Rash on legs  . Simvastatin     REACTION: pain  . Venlafaxine     REACTION: side effects  . Viibryd [Vilazodone Hcl]     nightmares  . Ketorolac Palpitations  . Tamsulosin Palpitations    Social History:   Social History   Socioeconomic History  . Marital status: Married    Spouse name: Not on file  . Number of children: Not on file  . Years of education: Not on file  . Highest education level: Not on file  Occupational History  . Occupation: Location manager: [employer]  . Financial resource strain: Not hard at all  . Food insecurity:    Worry: Never true    Inability: Never true  . Transportation needs:    Medical: No    Non-medical: No  Tobacco Use  . Smoking status: Never Smoker  . Smokeless tobacco: Never Used  Substance and Sexual Activity  . Alcohol use: Yes    Alcohol/week: 7.0 standard drinks    Types: 7 Glasses of wine per  week  . Drug use: No  . Sexual activity: Yes  Lifestyle  . Physical activity:    Days per week: 5 days    Minutes per session: 50 min  . Stress: Only a little  Relationships  . Social connections:    Talks on phone: More than three times a week    Gets together: More than three times a week    Attends religious service: More than 4 times per year    Active member of club or organization: Not on file    Attends meetings of clubs or organizations: More than 4 times per year    Relationship status: Married  . Intimate partner violence:    Fear of current or ex partner: No    Emotionally abused: No    Physically abused: No    Forced sexual activity: No  Other Topics Concern  . Not on file  Social History Narrative  . Not on file    Family History:   The patient's family history includes Arthritis in his sister; Coronary artery disease in his other; Heart disease in his brother and father; Hypertension in his other; Lupus in his sister. There is no history of Colon cancer, Esophageal cancer, Stomach cancer, or Rectal cancer.    ROS:  Please see the history of present illness.  All other ROS reviewed and negative.     Physical Exam/Data:   Vitals:   12/16/17 1415 12/16/17 1430 12/16/17 1445 12/16/17 1500  BP: (!) 149/79 137/85 136/64 135/69  Pulse: (!) 53 (!) 49 (!) 48 (!) 47  Resp: 14 (!) 28 17 17   Temp:      TempSrc:      SpO2: 96% 100% 98% 97%   No intake or output data in the 24 hours ending 12/16/17 1544 There were no vitals filed for this visit. There is no height or weight on file to calculate BMI.  General:  Well nourished, well developed, laying in bed in no acute distress HEENT: sclera anicteric Neck: no JVD Vascular:  No carotid bruits; distal pulses 2+ bilaterally  Cardiac:  normal S1, S2; bradycardic, regular rhythm; no murmurs/rubs/gallops Lungs:  clear to auscultation bilaterally, no wheezing, rhonchi or rales  Abd: soft, nontender, no hepatomegaly  Ext:  no edema Musculoskeletal:  No deformities, BUE and BLE strength normal and equal Skin: warm and dry  Neuro:  CNs 2-12 intact, no focal abnormalities noted Psych:  Normal affect    EKG:  sinus rhythm with PVC without STE/D Telemetry: predominantly sinus bradycardia  Relevant CV Studies:  Exercise stress myoview 02/2016:  The left ventricular ejection fraction is mildly decreased (45-54%).  Nuclear stress EF calculated at 49% but appears to be normal at 55-60%.  There was 1-67mm of horizontal to upsloping ST segment depression in the inferior leads at peak exericse in setting of hypertensive BP response to exercise.  Blood pressure demonstrated a hypertensive response to exercise.  The nuclear images are normal.  This is a low risk study.  Laboratory Data:  Chemistry Recent Labs  Lab 12/15/17 1713 12/16/17 1058  NA 136 137  K 5.0 4.1  CL 101 102  CO2 22 26  GLUCOSE 127* 169*  BUN 35* 22  CREATININE 1.39 1.02  CALCIUM 9.8 9.8  GFRNONAA  --  >60  GFRAA  --  >60  ANIONGAP  --  9    No results for input(s): PROT, ALBUMIN, AST, ALT, ALKPHOS, BILITOT in the last 168 hours. Hematology Recent Labs  Lab 12/15/17 1713 12/16/17 1058  WBC 8.1 6.2  RBC 4.09* 4.34  HGB 13.3 13.7  HCT 38.8* 41.2  MCV 94.7 94.9  MCH  --  31.6  MCHC 34.2 33.3  RDW 14.2 13.0  PLT 261.0 257   Cardiac EnzymesNo results for input(s): TROPONINI in the last 168 hours.  Recent Labs  Lab 12/16/17 1113  TROPIPOC 0.01    BNPNo results for input(s): BNP, PROBNP in the last 168 hours.  DDimer  Recent Labs  Lab 12/15/17 1713  DDIMER 0.78*    Radiology/Studies:  Ct Angio Chest Pe W And/or Wo Contrast  Result Date: 12/16/2017 CLINICAL DATA:  Syncope. EXAM: CT ANGIOGRAPHY CHEST WITH CONTRAST TECHNIQUE: Multidetector CT imaging of the chest was performed using the standard protocol during bolus administration of intravenous contrast. Multiplanar CT image reconstructions and MIPs were obtained  to evaluate the vascular anatomy. CONTRAST:  138mL ISOVUE-370 IOPAMIDOL (ISOVUE-370) INJECTION 76% COMPARISON:  Radiographs of June 03, 2010. FINDINGS: Cardiovascular: Satisfactory opacification of the pulmonary arteries to the segmental level. No evidence of pulmonary embolism. Normal heart size. No pericardial effusion. Atherosclerosis of thoracic aorta is noted. 4.2 cm ascending thoracic aortic aneurysm is noted. No pericardial effusion is noted. Coronary artery calcifications are noted. Mediastinum/Nodes: No enlarged mediastinal, hilar, or axillary lymph nodes. Thyroid gland, trachea, and esophagus demonstrate no significant findings. Lungs/Pleura: Lungs are clear. No pleural effusion or pneumothorax. Upper Abdomen: No acute abnormality. Musculoskeletal: No chest wall abnormality. No acute or significant osseous findings. Review of the MIP images confirms the above findings. IMPRESSION: No definite evidence of pulmonary embolus. Coronary artery calcifications are noted suggesting coronary artery disease. 4.2 cm ascending thoracic aortic aneurysm is noted. Recommend annual imaging followup by CTA or MRA. This recommendation follows 2010 ACCF/AHA/AATS/ACR/ASA/SCA/SCAI/SIR/STS/SVM Guidelines for the Diagnosis and Management of Patients with Thoracic Aortic Disease. Circulation. 2010; 121: J093-O671. Aortic Atherosclerosis (ICD10-I70.0). Electronically Signed   By: Marijo Conception, M.D.   On: 12/16/2017 14:45    Assessment and Plan:   1. Syncope: patient describes 1  episode of syncope after getting out of a hot tub which is suspicious for a vaso-vagal episode. He had a pre-syncopal episode yesterday morning while working outdoors and reported not having had anything to eat or drink that morning when he had sudden onset dizziness, nausea, and subsequent vomiting but did not lose consciousness. He was seen by PCP yesterday who recommended urgent cardiology evaluation and given lack of appointments outpatient  was sent to the ED. He denies chest pain surrounding either episode. Here he has been bradycardic. Trop negative x1. Ddimer was elevated outpatient and CTA Chest was without PE. EKG was non-ischemic without significant arrhythmia. No evidence of AV block on telemetry.  - Given recurrent episodes will plan for outpatient event monitor - Will plan for outpatient echocardiogram   2. CAD s/p stent to LAD: no anginal complaints.  - Continue aspirin and statin  3. HTN: BP persistently elevated - Increase losartan to 100mg  daily - Continue home amlodipine  4. HLD: LDL 73 on last lipid panel 04/2017 - Continue statin  5. DM type 2: A1C 6.8 yesterday; at goal of <7 - Continue home medications  6. Ascending thoracic aortic aneurysm: incidental finding on CTA Chest. No complaints of chest pain.  - Recommend annual monitoring and improved BP control     For questions or updates, please contact [place] Please consult [email or web address] for contact info under Cardiology/STEMI.    Signed, Abigail Butts, PA-C  12/16/2017 3:44 PM   I have seen and examined the patient along with Abigail Butts, PA-C.  I have reviewed the chart, notes and new data.  I agree with PA's note.  Key new complaints: Both his recent episodes of syncope sound highly compatible with vasovagal events.  On both occasions he had a long prodrome with dizziness, nausea and in the past has had flushing and diaphoresis.  He has learned to prevent treat his dizzy spells by lying down in the past.  He had a first syncopal event in church at age 38, also highly compatible with a vasovagal syncope. Excellent functional status without exertional angina or dyspnea despite an active lifestyle. Incidentally he reports that his blood pressure has been relatively high recently, ever since he switched from valsartan to losartan. Key examination changes: Bradycardia.  He has a distinct fourth heart sound but otherwise his cardiovascular  exam is normal.  No audible murmurs. Key new findings / data: Low risk ECG and cardiac enzymes.  Previous echo in 2012 without any evidence of aortic valve disease and normal left ventricular systolic function.  Relatively recent nuclear stress test without evidence of perfusion abnormalities.  PLAN: Strongly suspect neurally mediated syncope. Discussed avoiding triggers such as prolonged exposure to heat, orthostasis without moving, avoiding dehydration, etc. most importantly should immediately pay attention to prodromal complaints and immediately becomes supine. He has high vagal tone and reports that bradycardia is a lifelong trait.  He states that when he received his stent in 1997 the alarms constantly went off because of slow heart rates. Dental note of small ascending aortic aneurysm, recommend follow-up CT angiogram in 12 months. For blood pressure control prefer ARB for potential benefit in preventing progression of aortic aneurysm; avoid diuretics and potent vasodilators since they may potentiate risk for syncope. Recommend outpatient echo and event monitor, follow-up as already scheduled.  Do not plan any further inpatient evaluation.  Sanda Klein, MD, [place] [phone] 12/16/2017, 4:52 PM

## 2017-12-16 NOTE — ED Provider Notes (Signed)
Care assumed from Dr Drue Flirt at [id number].  67 year old male with history of 2 or 3 syncopal episodes over the past couple of weeks.  Has had a couple of bradycardic episodes into the 40s and 50s here in the emergency department.  D-dimer was elevated outpatient.  CTA negative for PE in the emergency department.  Cardiology is evaluating at bedside.  Follow-up recommendations.  Patient would like to be discharged if possible.  Physical Exam  BP (!) 147/103   Pulse (!) 54   Temp (!) 97.5 F (36.4 C) (Oral)   Resp 20   SpO2 99%   Physical Exam  ED Course/Procedures   Clinical Course as of Dec 17 1742  Thu Dec 16, 2017  [place] presents due to multiple syncopal episodes and elevated d-dimer as per above.Currently, he is hemodynamically stable albeit intermittently bradycardic into the 50s.  He also had one episode of bradycardia into the 40s.  He was asymptomatic during these periods.  The differential for his syncope is broad.  Includes cardiac dysrhythmia, valvular disorder, PE, ACS, stroke, seizure, and hypovolemia.  Based off his presentation and physical exam, stroke, seizure, hypokalemia are not likely.  There is no appreciable heart murmur.  His ECG reveals no evidence of acute ischemia or dysrhythmia.  There is no Brugada syndrome, LVH, prolonged QTC, ARVC, De Winters waves, or Wellens waves.  His initial troponin is not elevated.  He has no acute abnormalities on BMP or CBC.  CT PE study ordered.   [NA]  4765 The patient's PE study revealed no acute evidence of   [NA]  1507 The PE study revealed no evidence of PE.  It did reveal a 4 cm thoracic aneurysm.  I have paged cardiology to discuss further stratification and management.   [NA]  1540 At time of signout, cardiology recommendations were pending.  Signout was given to Dr. Corrie Dandy.   [NA]    Clinical Course User Index [NA] Alford Highland, MD    Procedures  MDM   Cardiology evaluated patient and feels that  he is likely having symptoms secondary to high vagal tone.  Recommend outpatient cardiac monitoring and echo.  Do not anticipate inpatient needs at this time. Pt reevalauted and resting comfortably. Reports felling well. Delta trop wnl.  CTA no PE or dissection. 4.1cm aneurysm. Included information regarding patient's incidental finding of thoracic aneurysm in his discharge paperwork and discussed with patient.  Advised one-year follow-up per radiology recommendations.  Pt feeling well. F/u plan in place with cardiology. Stable for discharge. Return precautions given. Stable at DC.     Corrie Dandy, MD 12/16/17 1840    Julianne Rice, MD 12/21/17 1728

## 2017-12-16 NOTE — ED Notes (Signed)
Patient transported to CT 

## 2017-12-16 NOTE — ED Triage Notes (Signed)
Patient sent from PCP for evaluation of EKG changes and intermittent dizziness over the last few months. Patient states over the last few months he has had several episodes of dizziness that sometimes lead to syncope. Patient alert, oriented, and in no apparent distress at this time. Patient reports yesterday at PCP he was told EKG shows a ventricular arrythmia and his d-dimer was positive.

## 2017-12-20 ENCOUNTER — Telehealth: Payer: Self-pay | Admitting: Cardiovascular Disease

## 2017-12-20 DIAGNOSIS — R55 Syncope and collapse: Secondary | ICD-10-CM

## 2017-12-20 NOTE — Telephone Encounter (Signed)
Pt was set up for 30 day event and Echo from the hospital. He had to cancel due to being out of town. He however has questions about the HM due to his lifestyle before he reschedules. Pls advise [phone]

## 2017-12-20 NOTE — Telephone Encounter (Signed)
Aorta 4.2 not that large can consider imaging in a year

## 2017-12-20 NOTE — Telephone Encounter (Signed)
Patient is out of town at this time and has f/u in September with Cecilie Kicks NP. Patient stated he would not be able to have a event monitor right now, but could do a 48 holter monitor. Patient stated his exercise is swimming for the summer and a 30 day monitor would not work for him. Patient was also wondering if he needed an echo still. Patient had CT where 4.2 cm ascending thoracic aortic aneurysm was noted. Will forward to Dr. Johnsie Cancel for advisement.

## 2017-12-21 NOTE — Telephone Encounter (Signed)
Well he does need the echo.  And we can start with 48 hour monitor.  Thanks.

## 2017-12-21 NOTE — Telephone Encounter (Signed)
Called patient to let him know he needs to still get an echo and have a 48 hour monitor placed. Informed patient that someone will call him to schedule these two test. Patient verbalized understanding.

## 2017-12-23 ENCOUNTER — Other Ambulatory Visit (HOSPITAL_COMMUNITY): Payer: Self-pay

## 2018-01-05 ENCOUNTER — Ambulatory Visit (INDEPENDENT_AMBULATORY_CARE_PROVIDER_SITE_OTHER): Payer: Medicare Other | Admitting: Psychology

## 2018-01-05 DIAGNOSIS — F341 Dysthymic disorder: Secondary | ICD-10-CM | POA: Diagnosis not present

## 2018-01-05 DIAGNOSIS — F411 Generalized anxiety disorder: Secondary | ICD-10-CM

## 2018-01-06 ENCOUNTER — Ambulatory Visit (HOSPITAL_COMMUNITY): Payer: Medicare Other | Attending: Medical

## 2018-01-06 ENCOUNTER — Ambulatory Visit (INDEPENDENT_AMBULATORY_CARE_PROVIDER_SITE_OTHER): Payer: Medicare Other

## 2018-01-06 ENCOUNTER — Other Ambulatory Visit: Payer: Self-pay

## 2018-01-06 DIAGNOSIS — R55 Syncope and collapse: Secondary | ICD-10-CM | POA: Diagnosis not present

## 2018-01-06 DIAGNOSIS — I110 Hypertensive heart disease with heart failure: Secondary | ICD-10-CM | POA: Diagnosis not present

## 2018-01-06 DIAGNOSIS — I509 Heart failure, unspecified: Secondary | ICD-10-CM | POA: Insufficient documentation

## 2018-01-06 DIAGNOSIS — I082 Rheumatic disorders of both aortic and tricuspid valves: Secondary | ICD-10-CM | POA: Insufficient documentation

## 2018-01-07 ENCOUNTER — Telehealth: Payer: Self-pay

## 2018-01-07 NOTE — Telephone Encounter (Signed)
Notes recorded by Frederik Schmidt, RN on 01/07/2018 at 12:09 PM EDT Informed patient of results/recommendations. He verbalized understanding. ------

## 2018-01-07 NOTE — Telephone Encounter (Signed)
-----   Message from Abigail Butts, PA-C sent at 01/07/2018 12:06 PM EDT ----- Please notify the patient that his ultrasound of his heart looks good. No significant change from he previous ultrasound. He can discuss his results further at his follow-up appointment with Cecilie Kicks on 01/26/18. Thanks you!

## 2018-01-11 ENCOUNTER — Ambulatory Visit: Payer: Medicare Other | Admitting: Psychology

## 2018-01-17 ENCOUNTER — Telehealth: Payer: Self-pay | Admitting: *Deleted

## 2018-01-17 ENCOUNTER — Ambulatory Visit: Payer: Medicare Other | Admitting: Psychology

## 2018-01-17 DIAGNOSIS — R55 Syncope and collapse: Secondary | ICD-10-CM

## 2018-01-17 DIAGNOSIS — R001 Bradycardia, unspecified: Secondary | ICD-10-CM

## 2018-01-17 NOTE — Telephone Encounter (Signed)
-----   Message from Isaiah Serge, NP sent at 01/14/2018  6:55 PM EDT ----- Please have pt see EP per Dr. Tamala Julian.  HR down into 40s and no symptoms, reasonable thought that could go slower causing his syncope - EP will eval and decide plan of treatment.    It would be better if he followed up with EP than me.  Thanks. Mickel Baas

## 2018-01-24 ENCOUNTER — Ambulatory Visit: Payer: Self-pay | Admitting: Nurse Practitioner

## 2018-01-25 ENCOUNTER — Ambulatory Visit (INDEPENDENT_AMBULATORY_CARE_PROVIDER_SITE_OTHER): Payer: Medicare Other | Admitting: Internal Medicine

## 2018-01-25 ENCOUNTER — Encounter: Payer: Self-pay | Admitting: Internal Medicine

## 2018-01-25 VITALS — BP 144/77 | HR 56 | Ht 68.0 in | Wt 177.2 lb

## 2018-01-25 DIAGNOSIS — R55 Syncope and collapse: Secondary | ICD-10-CM

## 2018-01-25 DIAGNOSIS — R001 Bradycardia, unspecified: Secondary | ICD-10-CM | POA: Diagnosis not present

## 2018-01-25 DIAGNOSIS — I2510 Atherosclerotic heart disease of native coronary artery without angina pectoris: Secondary | ICD-10-CM | POA: Diagnosis not present

## 2018-01-25 NOTE — Progress Notes (Signed)
ELECTROPHYSIOLOGY CONSULT NOTE  Patient ID: Jared Alvarez, MRN: [id number], DOB/AGE: Sep 28, 1950 67 y.o. Admit date: (Not on file) Date of Consult: 01/25/2018  Primary Physician: Cassandria Anger, MD Primary Cardiologist: Andi Mahaffy Mcniel is a 67 y.o. male who is being seen today for the evaluation of syncope at the request of PNI.    HPI Jared Alvarez is a 66 y.o. male Seen for syncope.  He has a remote hx of syncope dating back to when he was a kid.  He had 2 episodes this summer..  The first episode was described as occurring following getting out of a hot tub where he had sat for about 15 min and had a beer.  the second occurred while working outside on hot day pressure washing.  Both had a stereotypical prodome which he remembered from his teen years .     He has hx of CAD with Stenting and instent RS      Exercises regularly and vigorously   DM and HTN    DATE TEST EF   10/12 Echo   55-60 % LVHmild mild aortic enlargement 4.2  10/17 MYOVIEW    % Perfusion normal  9/19 Echo  55-65%    He was given a Holter monitor which demonstrated heart rates in the high 30s at the 8:00 hour.  Interestingly, his highest heart rates are in the 1500-2300-hour intervals.    Past Medical History:  Diagnosis Date  . Allergy   . Anxiety    no per pt  . Coronary artery disease   . Depression    no per pt  . DM type 2 (diabetes mellitus, type 2) ([place])   . GERD (gastroesophageal reflux disease)   . Gout   . Hearing loss   . Hyperlipidemia   . Hypertension   . Hypogonadism male   . Inguinal hernia    right  . LBP (low back pain)   . Leg cramps   . Osteoarthritis   . Skin neoplasm   . Tinnitus   . Vertigo   . Vitamin B12 deficiency       Surgical History:  Past Surgical History:  Procedure Laterality Date  . heart stent     in 1997  . INGUINAL HERNIA REPAIR       Home Meds: Prior to Admission medications   Medication Sig Start Date End Date Taking?  Authorizing Provider  acetaminophen (TYLENOL) 500 MG tablet Take 1 tablet (500 mg total) by mouth every 6 (six) hours as needed. Patient taking differently: Take 500 mg by mouth every 6 (six) hours as needed for mild pain.  06/23/17   Plotnikov, Evie Lacks, MD  amLODipine (NORVASC) 5 MG tablet Take 1 tablet (5 mg total) by mouth daily. Annual appt due in Oct must see provider for future refills Patient taking differently: Take 5 mg by mouth daily.  09/06/17   Plotnikov, Evie Lacks, MD  Ascorbic Acid (VITAMIN C) 1000 MG tablet Take 1 tablet (1,000 mg total) by mouth daily. Patient not taking: Reported on 12/16/2017 06/23/17   Plotnikov, Evie Lacks, MD  aspirin 81 MG tablet Take 1 tablet (81 mg total) by mouth daily. 06/23/17   Plotnikov, Evie Lacks, MD  Cholecalciferol (VITAMIN D3) 2000 units capsule Take 1 capsule (2,000 Units total) by mouth daily. 06/23/17   Plotnikov, Evie Lacks, MD  colchicine 0.6 MG tablet Take 1 tablet (0.6 mg total) by mouth as needed.  If patient has flare up with gout 09/21/16   Plotnikov, Evie Lacks, MD  Cyanocobalamin (VITAMIN B-12) 500 MCG SUBL Place 1 tablet (500 mcg total) under the tongue daily. 06/23/17   Plotnikov, Evie Lacks, MD  FLUoxetine (PROZAC) 40 MG capsule Take 1 capsule (40 mg total) by mouth daily. Annual appt due in Oct must see provider for future refills Patient not taking: Reported on 12/16/2017 09/06/17   Plotnikov, Evie Lacks, MD  glucose blood (ONETOUCH VERIO) test strip Use as instructed 06/23/17   Plotnikov, Evie Lacks, MD  Homeopathic Products (ARNICARE ARNICA) OINT Apply 1 application topically 2 (two) times daily as needed (arthritis).    [provider]  LORazepam (ATIVAN) 1 MG tablet Take 1 tablet (1 mg total) by mouth 2 (two) times daily as needed for anxiety. Patient taking differently: Take 0.5 mg by mouth at bedtime.  12/15/17   Plotnikov, Evie Lacks, MD  losartan (COZAAR) 100 MG tablet Take 1 tablet (100 mg total) by mouth daily. Annual appt due in Oct  must see provider for future refills 12/16/17 03/16/18  Tommye Standard, Lorelee Cover., PA-C  metFORMIN (GLUCOPHAGE) 1000 MG tablet Take 1 tablet (1,000 mg total) by mouth 2 (two) times daily with a meal. 09/06/17   Plotnikov, Evie Lacks, MD  nitroGLYCERIN (NITROSTAT) 0.4 MG SL tablet Place 1 tablet (0.4 mg total) under the tongue every 5 (five) minutes as needed for chest pain. 06/23/17 12/16/17  Plotnikov, Evie Lacks, MD  ONETOUCH DELICA LANCETS FINE MISC 1 Device by Does not apply route daily as needed. 06/23/17   Plotnikov, Evie Lacks, MD  pantoprazole (PROTONIX) 40 MG tablet Take 1 tablet (40 mg total) by mouth 2 (two) times daily. Annual appt due in Oct must see provider for future refills Patient taking differently: Take 40 mg by mouth 2 (two) times daily as needed (acid reflux).  09/06/17   Plotnikov, Evie Lacks, MD  ranitidine (ZANTAC) 150 MG/10ML syrup Take 10 mLs (150 mg total) by mouth as needed for heartburn. Patient not taking: Reported on 12/16/2017 06/23/17   Plotnikov, Evie Lacks, MD  rosuvastatin (CRESTOR) 40 MG tablet Take 1 tablet (40 mg total) by mouth daily. Annual appt due in Oct must see provider for future refills Patient taking differently: Take 40 mg by mouth daily.  09/06/17   Plotnikov, Evie Lacks, MD  sildenafil (REVATIO) 20 MG tablet Take 1-5 (20-100mg ) tablets by mouth daily as needed Patient taking differently: Take 20-100 mg by mouth as needed (For erectile dysfunction.).  12/15/17   Plotnikov, Evie Lacks, MD  traMADol (ULTRAM) 50 MG tablet Take 1 tablet (50 mg total) by mouth every 6 (six) hours as needed for severe pain. 06/23/17   Plotnikov, Evie Lacks, MD    Allergies:  Allergies  Allergen Reactions  . Atorvastatin     REACTION: pains  . Bupropion Hcl     REACTION: side effects  . Duloxetine     REACTION: dizzy  . Nexium [Esomeprazole Magnesium]     Rash on legs  . Simvastatin     REACTION: pain  . Venlafaxine     REACTION: side effects  . Viibryd [Vilazodone Hcl]     nightmares    . Ketorolac Palpitations  . Tamsulosin Palpitations    Social History   Socioeconomic History  . Marital status: Married    Spouse name: Not on file  . Number of children: Not on file  . Years of education: Not on file  . Highest education level: Not  on file  Occupational History  . Occupation: Location manager: [employer]  . Financial resource strain: Not hard at all  . Food insecurity:    Worry: Never true    Inability: Never true  . Transportation needs:    Medical: No    Non-medical: No  Tobacco Use  . Smoking status: Never Smoker  . Smokeless tobacco: Never Used  Substance and Sexual Activity  . Alcohol use: Yes    Alcohol/week: 7.0 standard drinks    Types: 7 Glasses of wine per week  . Drug use: No  . Sexual activity: Yes  Lifestyle  . Physical activity:    Days per week: 5 days    Minutes per session: 50 min  . Stress: Only a little  Relationships  . Social connections:    Talks on phone: More than three times a week    Gets together: More than three times a week    Attends religious service: More than 4 times per year    Active member of club or organization: Not on file    Attends meetings of clubs or organizations: More than 4 times per year    Relationship status: Married  . Intimate partner violence:    Fear of current or ex partner: No    Emotionally abused: No    Physically abused: No    Forced sexual activity: No  Other Topics Concern  . Not on file  Social History Narrative  . Not on file     Family History  Problem Relation Age of Onset  . Heart disease Father   . Arthritis Sister   . Heart disease Brother   . Coronary artery disease Other   . Hypertension Other   . Lupus Sister   . Colon cancer Neg Hx   . Esophageal cancer Neg Hx   . Stomach cancer Neg Hx   . Rectal cancer Neg Hx      ROS:  Please see the history of present illness.     All other systems reviewed and negative.     Physical Exam: Blood pressure (!) 144/77, pulse (!) 56, height 5\' 8"  (1.727 m), weight 177 lb 3.2 oz (80.4 kg), SpO2 96 %. General: Well developed, well nourished male in no acute distress. Head: Normocephalic, atraumatic, sclera non-icteric, no xanthomas, nares are without discharge. EENT: normal  Lymph Nodes:  none Neck: Negative for carotid bruits. JVD not elevated. Back:without scoliosis kyphosis Lungs: Clear bilaterally to auscultation without wheezes, rales, or rhonchi. Breathing is unlabored. Heart: RRR with S1 S2.  2/6 systolic murmur . No rubs, or gallops appreciated. Abdomen: Soft, non-tender, non-distended with normoactive bowel sounds. No hepatomegaly. No rebound/guarding. No obvious abdominal masses. Msk:  Strength and tone appear normal for age. Extremities: No clubbing or cyanosis. No edema.  Distal pedal pulses are 2+ and equal bilaterally. Skin: Warm and Dry Neuro: Alert and oriented X 3. CN III-XII intact Grossly normal sensory and motor function . Psych:  Responds to questions appropriately with a normal affect.      Labs: Cardiac Enzymes No results for input(s): CKTOTAL, CKMB, TROPONINI in the last 72 hours. CBC Lab Results  Component Value Date   WBC 6.2 12/16/2017   HGB 13.7 12/16/2017   HCT 41.2 12/16/2017   MCV 94.9 12/16/2017   PLT 257 12/16/2017   PROTIME: No results for input(s): LABPROT, INR in the last 72 hours. Chemistry No results for  input(s): NA, K, CL, CO2, BUN, CREATININE, CALCIUM, PROT, BILITOT, ALKPHOS, ALT, AST, GLUCOSE in the last 168 hours.  Invalid input(s): LABALBU Lipids Lab Results  Component Value Date   CHOL 159 04/29/2017   HDL 68.80 04/29/2017   LDLCALC 73 04/29/2017   TRIG 85.0 04/29/2017   BNP No results found for: PROBNP Thyroid Function Tests: No results for input(s): TSH, T4TOTAL, T3FREE, THYROIDAB in the last 72 hours.  Invalid input(s): FREET3 Miscellaneous Lab Results  Component Value Date   DDIMER 0.78  (H) 12/15/2017    Radiology/Studies:  No results found.  EKG: NSR 56 16/09/42   Assessment and Plan: \ Syncope probably neurally mediated  Coronary artery disease with prior LAD stenting  Hypertension  Diabetes    The patient has syncope with a recognizable prodrome dating back to his very young years.  Almost certainly the effector limb was neurally mediated reflex.  The question was the trigger.  The context certainly suggested could have been heat in combination with alcohol on the first and heat in combination with work on the second.  However, given his multiple cardiac risk factors and his known coronary artery disease excluding a Bezold Jarisch reflex becomes important.  I reviewed this with Dr. Raynaldo Opitz given his LAD stent and even though we are looking at his RCA/circumflex, he suggest not using CTA so we will undertake stress nuclear scanning.  We have reviewed the physiology and the importance of avoiding the triggering situations.      Virl Axe

## 2018-01-25 NOTE — Patient Instructions (Signed)
Medication Instructions:  Your physician recommends that you continue on your current medications as directed. Please refer to the Current Medication list given to you today.  Labwork: None ordered.  Testing/Procedures:  Cardiac Nuclear Scan A cardiac nuclear scan is a test that measures blood flow to the heart when a person is resting and when he or she is exercising. The test looks for problems such as:  Not enough blood reaching a portion of the heart.  The heart muscle not working normally.  You may need this test if:  You have heart disease.  You have had abnormal lab results.  You have had heart surgery or angioplasty.  You have chest pain.  You have shortness of breath.  In this test, a radioactive dye (tracer) is injected into your bloodstream. After the tracer has traveled to your heart, an imaging device is used to measure how much of the tracer is absorbed by or distributed to various areas of your heart. This procedure is usually done at a hospital and takes 2-4 hours. Tell a health care provider about:  Any allergies you have.  All medicines you are taking, including vitamins, herbs, eye drops, creams, and over-the-counter medicines.  Any problems you or family members have had with the use of anesthetic medicines.  Any blood disorders you have.  Any surgeries you have had.  Any medical conditions you have.  Whether you are pregnant or may be pregnant. What are the risks? Generally, this is a safe procedure. However, problems may occur, including:  Serious chest pain and heart attack. This is only a risk if the stress portion of the test is done.  Rapid heartbeat.  Sensation of warmth in your chest. This usually passes quickly.  What happens before the procedure?  Ask your health care provider about changing or stopping your regular medicines. This is especially important if you are taking diabetes medicines or blood thinners.  Remove your jewelry  on the day of the procedure. What happens during the procedure?  An IV tube will be inserted into one of your veins.  Your health care provider will inject a small amount of radioactive tracer through the tube.  You will wait for 20-40 minutes while the tracer travels through your bloodstream.  Your heart activity will be monitored with an electrocardiogram (ECG).  You will lie down on an exam table.  Images of your heart will be taken for about 15-20 minutes.  You may be asked to exercise on a treadmill or stationary bike. While you exercise, your heart's activity will be monitored with an ECG, and your blood pressure will be checked. If you are unable to exercise, you may be given a medicine to increase blood flow to parts of your heart.  When blood flow to your heart has peaked, a tracer will again be injected through the IV tube.  After 20-40 minutes, you will get back on the exam table and have more images taken of your heart.  When the procedure is over, your IV tube will be removed. The procedure may vary among health care providers and hospitals. Depending on the type of tracer used, scans may need to be repeated 3-4 hours later. What happens after the procedure?  Unless your health care provider tells you otherwise, you may return to your normal schedule, including diet, activities, and medicines.  Unless your health care provider tells you otherwise, you may increase your fluid intake. This will help flush the contrast dye from your  body. Drink enough fluid to keep your urine clear or pale yellow.  It is up to you to get your test results. Ask your health care provider, or the department that is doing the test, when your results will be ready. Summary  A cardiac nuclear scan measures the blood flow to the heart when a person is resting and when he or she is exercising.  You may need this test if you are at risk for heart disease.  Tell your health care provider if you  are pregnant.  Unless your health care provider tells you otherwise, increase your fluid intake. This will help flush the contrast dye from your body. Drink enough fluid to keep your urine clear or pale yellow. This information is not intended to replace advice given to you by your health care provider. Make sure you discuss any questions you have with your health care provider. Document Released: 05/15/2004 Document Revised: 04/22/2016 Document Reviewed: 03/29/2013 Elsevier Interactive Patient Education  2017 [place]: Your physician recommends that you schedule a follow-up appointment as needed with Dr Caryl Comes.  Any Other Special Instructions Will Be Listed Below (If Applicable).     If you need a refill on your cardiac medications before your next appointment, please call your pharmacy.

## 2018-01-26 ENCOUNTER — Telehealth (HOSPITAL_COMMUNITY): Payer: Self-pay

## 2018-01-26 ENCOUNTER — Ambulatory Visit: Payer: Self-pay | Admitting: Cardiology

## 2018-01-26 NOTE — Telephone Encounter (Signed)
Encounter complete. 

## 2018-01-27 ENCOUNTER — Ambulatory Visit (INDEPENDENT_AMBULATORY_CARE_PROVIDER_SITE_OTHER): Payer: Medicare Other | Admitting: Psychology

## 2018-01-27 ENCOUNTER — Ambulatory Visit (HOSPITAL_COMMUNITY)
Admission: RE | Admit: 2018-01-27 | Discharge: 2018-01-27 | Disposition: A | Discharge status: Home / Self Care | Payer: Medicare Other | Source: Ambulatory Visit | Attending: Cardiology | Admitting: Cardiology

## 2018-01-27 DIAGNOSIS — I2510 Atherosclerotic heart disease of native coronary artery without angina pectoris: Secondary | ICD-10-CM

## 2018-01-27 DIAGNOSIS — F411 Generalized anxiety disorder: Secondary | ICD-10-CM | POA: Diagnosis not present

## 2018-01-27 DIAGNOSIS — F341 Dysthymic disorder: Secondary | ICD-10-CM

## 2018-01-27 LAB — MYOCARDIAL PERFUSION IMAGING
CHL CUP NUCLEAR SRS: 0
CHL CUP RESTING HR STRESS: 50 {beats}/min
CSEPHR: 88 %
CSEPPHR: 136 {beats}/min
Estimated workload: 10 METS
Exercise duration (min): 9 min
Exercise duration (sec): 1 s
LVDIAVOL: 137 mL (ref 62–150)
LVSYSVOL: 79 mL
MPHR: 153 {beats}/min
RPE: 19
SDS: 2
SSS: 2
TID: 1.03

## 2018-01-27 MED ORDER — TECHNETIUM TC 99M TETROFOSMIN IV KIT
31.9000 | PACK | Freq: Once | INTRAVENOUS | Status: AC | PRN
  Administered 2018-01-27: 31.9 via INTRAVENOUS
  Filled 2018-01-27: qty 32

## 2018-01-27 MED ORDER — TECHNETIUM TC 99M TETROFOSMIN IV KIT
10.3000 | PACK | Freq: Once | INTRAVENOUS | Status: AC | PRN
  Administered 2018-01-27: 10.3 via INTRAVENOUS
  Filled 2018-01-27: qty 11

## 2018-02-02 ENCOUNTER — Telehealth: Payer: Self-pay | Admitting: Internal Medicine

## 2018-02-02 NOTE — Telephone Encounter (Signed)
-----   Message from Will Meredith Leeds, MD sent at 02/01/2018 10:08 AM EDT ----- No obvious ischemia noted on perfusion images.  EKG with positive stress testing.  Plan per Dr. Caryl Comes on his return.

## 2018-02-02 NOTE — Telephone Encounter (Signed)
Pt called and we reviewed his myoview. Pt understands he has normal perfusion but a positive stress reading during the test. Pt understands Dr Caryl Comes is out of the office until next week and I will discuss with him asap. In the meantime, pt understands if he has any additional syncope, CP, or SOB pt is to go to the ED.

## 2018-02-02 NOTE — Telephone Encounter (Signed)
New Message:   Patient calling about some results.

## 2018-02-03 ENCOUNTER — Telehealth: Payer: Self-pay

## 2018-02-03 NOTE — Telephone Encounter (Signed)
-----   Message from Michaelyn Barter, RN sent at 02/03/2018 10:04 AM EDT ----- Regarding: RE: Positive stress Patient scheduled 10/30 ----- Message ----- From: Josue Hector, MD Sent: 02/02/2018   3:22 PM EDT To: Deboraha Sprang, MD, Michaelyn Barter, RN, # Subject: RE: Positive stress                            I spoke with patient. He is active with no chest pain EF is normal by echo He has a known history of false positive ECG response on stress testing in 2017 with normal images then and on 01/27/18 with no ischemia. Holter with PVC;s no NSVT Dr Caryl Comes thinks episodes are neurally mediated and since there is no evidence of ischemia on myovue I don't think he needs cath have him f/u with me next available He is happy with that plan   ----- Message ----- From: Dollene Primrose, RN Sent: 02/02/2018   1:43 PM EDT To: Josue Hector, MD, Michaelyn Barter, RN Subject: Positive stress                                Good Afternoon Dr Johnsie Cancel,  I wanted to ask you what you thought about [name]'s positive stress test. He called today to discuss it. I told him I would talk with Dr Caryl Comes when he returns, but I noticed he saw you for general cards and thought I could get your input/recommendations.  Thanks!  Vonda Harth

## 2018-02-11 ENCOUNTER — Ambulatory Visit (INDEPENDENT_AMBULATORY_CARE_PROVIDER_SITE_OTHER): Payer: Medicare Other | Admitting: Psychology

## 2018-02-11 DIAGNOSIS — F341 Dysthymic disorder: Secondary | ICD-10-CM

## 2018-02-11 DIAGNOSIS — F411 Generalized anxiety disorder: Secondary | ICD-10-CM

## 2018-02-17 ENCOUNTER — Encounter: Payer: Self-pay | Admitting: Family Medicine

## 2018-02-17 ENCOUNTER — Ambulatory Visit (INDEPENDENT_AMBULATORY_CARE_PROVIDER_SITE_OTHER): Payer: Medicare Other | Admitting: Family Medicine

## 2018-02-17 ENCOUNTER — Ambulatory Visit (INDEPENDENT_AMBULATORY_CARE_PROVIDER_SITE_OTHER)
Admission: RE | Admit: 2018-02-17 | Discharge: 2018-02-17 | Disposition: A | Discharge status: Home / Self Care | Payer: Medicare Other | Source: Ambulatory Visit | Attending: Family Medicine | Admitting: Family Medicine

## 2018-02-17 DIAGNOSIS — Z23 Encounter for immunization: Secondary | ICD-10-CM

## 2018-02-17 DIAGNOSIS — I2510 Atherosclerotic heart disease of native coronary artery without angina pectoris: Secondary | ICD-10-CM | POA: Diagnosis not present

## 2018-02-17 DIAGNOSIS — M25562 Pain in left knee: Secondary | ICD-10-CM

## 2018-02-17 DIAGNOSIS — S8992XA Unspecified injury of left lower leg, initial encounter: Secondary | ICD-10-CM | POA: Diagnosis not present

## 2018-02-17 NOTE — Progress Notes (Signed)
Subjective:    Patient ID: Jared Alvarez, male    DOB: Jun 01, 1950, 66 y.o.   MRN: [id number]  HPI  Mr. Sanden is a 67 year old male who presents today with left knee pain. Pain started 2 days ago after tripping walking his dog. He reports falling on his knee and states that he does not think this is related to the fall but may be due to history of gout per patient. He opted to take one colchicine just prior to visit.  Pain is rated as 6/10 Described as aching Aggravated by walking after an extended period of time. Going up his stairway can cause discomfort. Alleviated by ice/heat and Arnica Associated symptoms: Fever: No Chills: No Sweats: No Rash: No Fatigue: No Prior injury/trauma: Fall after walking dog. Surgery: No Swelling: No Prior treatment: Ice and Arnica that provided limited benefit. One colchicine was taken just before visit.    Review of Systems  Constitutional: Negative for chills, fatigue and fever.  Respiratory: Negative for cough, shortness of breath and wheezing.   Cardiovascular: Negative for chest pain and palpitations.  Gastrointestinal: Negative for abdominal pain.  Musculoskeletal: Negative for arthralgias.       Left knee pain  Skin: Negative for rash.       "Scrape on knee"  Neurological: Negative for weakness and numbness.   Past Medical History:  Diagnosis Date  . Allergy   . Anxiety    no per pt  . Coronary artery disease   . Depression    no per pt  . DM type 2 (diabetes mellitus, type 2) ([place])   . GERD (gastroesophageal reflux disease)   . Gout   . Hearing loss   . Hyperlipidemia   . Hypertension   . Hypogonadism male   . Inguinal hernia    right  . LBP (low back pain)   . Leg cramps   . Osteoarthritis   . Skin neoplasm   . Tinnitus   . Vertigo   . Vitamin B12 deficiency      Social History   Socioeconomic History  . Marital status: Married    Spouse name: Not on file  . Number of children: Not on file  . Years of  education: Not on file  . Highest education level: Not on file  Occupational History  . Occupation: Location manager: [employer]  . Financial resource strain: Not hard at all  . Food insecurity:    Worry: Never true    Inability: Never true  . Transportation needs:    Medical: No    Non-medical: No  Tobacco Use  . Smoking status: Never Smoker  . Smokeless tobacco: Never Used  Substance and Sexual Activity  . Alcohol use: Yes    Alcohol/week: 7.0 standard drinks    Types: 7 Glasses of wine per week  . Drug use: No  . Sexual activity: Yes  Lifestyle  . Physical activity:    Days per week: 5 days    Minutes per session: 50 min  . Stress: Only a little  Relationships  . Social connections:    Talks on phone: More than three times a week    Gets together: More than three times a week    Attends religious service: More than 4 times per year    Active member of club or organization: Not on file    Attends meetings of clubs or organizations: More  than 4 times per year    Relationship status: Married  . Intimate partner violence:    Fear of current or ex partner: No    Emotionally abused: No    Physically abused: No    Forced sexual activity: No  Other Topics Concern  . Not on file  Social History Narrative  . Not on file    Past Surgical History:  Procedure Laterality Date  . heart stent     in 1997  . INGUINAL HERNIA REPAIR      Family History  Problem Relation Age of Onset  . Heart disease Father   . Arthritis Sister   . Heart disease Brother   . Coronary artery disease Other   . Hypertension Other   . Lupus Sister   . Colon cancer Neg Hx   . Esophageal cancer Neg Hx   . Stomach cancer Neg Hx   . Rectal cancer Neg Hx     Allergies  Allergen Reactions  . Atorvastatin     REACTION: pains  . Bupropion Hcl     REACTION: side effects  . Duloxetine     REACTION: dizzy  . Nexium [Esomeprazole Magnesium]      Rash on legs  . Simvastatin     REACTION: pain  . Venlafaxine     REACTION: side effects  . Viibryd [Vilazodone Hcl]     nightmares  . Ketorolac Palpitations  . Tamsulosin Palpitations    Current Outpatient Medications on File Prior to Visit  Medication Sig Dispense Refill  . acetaminophen (TYLENOL) 500 MG tablet Take 1 tablet (500 mg total) by mouth every 6 (six) hours as needed. 30 tablet 2  . amLODipine (NORVASC) 5 MG tablet Take 1 tablet (5 mg total) by mouth daily. Annual appt due in Oct must see provider for future refills 90 tablet 1  . Ascorbic Acid (VITAMIN C) 1000 MG tablet Take 1 tablet (1,000 mg total) by mouth daily. 1000 tablet 2  . aspirin 81 MG tablet Take 1 tablet (81 mg total) by mouth daily. 30 tablet 2  . Cholecalciferol (VITAMIN D3) 2000 units capsule Take 1 capsule (2,000 Units total) by mouth daily. 100 capsule 2  . colchicine 0.6 MG tablet Take 1 tablet (0.6 mg total) by mouth as needed. If patient has flare up with gout 30 tablet 5  . Cyanocobalamin (VITAMIN B-12) 500 MCG SUBL Place 1 tablet (500 mcg total) under the tongue daily. 150 tablet 2  . FLUoxetine (PROZAC) 40 MG capsule Take 1 capsule (40 mg total) by mouth daily. Annual appt due in Oct must see provider for future refills 90 capsule 1  . glucose blood (ONETOUCH VERIO) test strip Use as instructed 50 each 2  . Homeopathic Products (ARNICARE ARNICA) OINT Apply 1 application topically 2 (two) times daily as needed (arthritis).    . LORazepam (ATIVAN) 1 MG tablet Take 1 tablet (1 mg total) by mouth 2 (two) times daily as needed for anxiety. 60 tablet 2  . losartan (COZAAR) 100 MG tablet Take 1 tablet (100 mg total) by mouth daily. Annual appt due in Oct must see provider for future refills 90 tablet 3  . metFORMIN (GLUCOPHAGE) 1000 MG tablet Take 1 tablet (1,000 mg total) by mouth 2 (two) times daily with a meal. 180 tablet 1  . nitroGLYCERIN (NITROSTAT) 0.4 MG SL tablet Place 1 tablet (0.4 mg total) under  the tongue every 5 (five) minutes as needed for chest pain. 25 tablet 3  .  ONETOUCH DELICA LANCETS FINE MISC 1 Device by Does not apply route daily as needed. 100 each 3  . pantoprazole (PROTONIX) 40 MG tablet Take 1 tablet (40 mg total) by mouth 2 (two) times daily. Annual appt due in Oct must see provider for future refills 180 tablet 1  . ranitidine (ZANTAC) 150 MG/10ML syrup Take 10 mLs (150 mg total) by mouth as needed for heartburn. 300 mL 2  . rosuvastatin (CRESTOR) 40 MG tablet Take 1 tablet (40 mg total) by mouth daily. Annual appt due in Oct must see provider for future refills 90 tablet 1  . sildenafil (REVATIO) 20 MG tablet Take 1-5 (20-100mg ) tablets by mouth daily as needed 60 tablet 5  . traMADol (ULTRAM) 50 MG tablet Take 1 tablet (50 mg total) by mouth every 6 (six) hours as needed for severe pain. 120 tablet 2   No current facility-administered medications on file prior to visit.     BP 128/82 (BP Location: Left Arm, Patient Position: Sitting, Cuff Size: Normal)   Pulse (!) 48   Temp 98 F (36.7 C) (Oral)   Ht 5\' 8"  (1.727 m)   Wt 180 lb (81.6 kg)   SpO2 99%   BMI 27.37 kg/m       Objective:   Physical Exam  Constitutional: He is oriented to person, place, and time. He appears well-developed and well-nourished.  Eyes: Pupils are equal, round, and reactive to light. No scleral icterus.  Cardiovascular: Normal rate, regular rhythm and intact distal pulses.  Pulmonary/Chest: Effort normal and breath sounds normal. He has no wheezes. He has no rales.  Abdominal: Soft. Bowel sounds are normal.  Musculoskeletal:  Knee: No obvious bony abnormalities present  Mild diffuse edema with tenderness present. No fluctuance, warmth or streaking appreciated. No patellar tenderness appreciated. ROM normal in flexion and extension and lower leg rotation. Negative Mcmurray's  Hamstring and quadriceps strength is normal.  Neurological: He is alert and oriented to person, place, and  time.  Skin: Skin is warm and dry. No rash noted.  Abrasion on left knee approximately quarter size and one dime size abrasion on lateral aspect of knee. No drainage present. No erythema or warmth  Psychiatric: He has a normal mood and affect. His behavior is normal. Judgment and thought content normal.      Assessment & Plan:  1. Acute pain of left knee Able to ambulate and pain occurs after extended period of time when ambulating. With recent fall, will obtain an X-ray to evaluate for acute injury and advised use of ice and short course of ibuprofen with food. Suspect that pain is most likely related to recent fall and less likely gout. We discussed that treatment for inflammation would benefit symptoms of gout also. Discussed options and he would like to try  Ibuprofen 600 mg every 6 hours with food.  We discussed option of prednisone but opted to avoid this medication with history of diabetes. Offered other prescribed antiinflammatory medication which patient declined. Further discussed option of Pennsaid topical however this medication is too expensive per patient.  Abrasions are healing without difficulty. Advised keeping area clean and dry. Further plan will be determined after review of X-ray of knee. We discussed possible ortho or sports medicine referral might be needed. Return precautions advised.  - DG Knee Complete 4 Views Left; Future  2. Need for influenza vaccination  - Flu vaccine HIGH DOSE PF (Fluzone High dose)  Delano Metz, FNP-C

## 2018-02-17 NOTE — Patient Instructions (Signed)
An X-ray of your knee has been ordered for you today.  Please use ice for 15 minutes every hour for discomfort.  Ibuprofen 600 mg (3 tablets) can be taken every 6 hours with food as needed.  Results of your X-ray will be called to you within one week or sooner if needed.  If symptoms persist with treatment of ibuprofen and rest, advise follow up with your provider for further evaluation.   Knee Pain, Adult Many things can cause knee pain. The pain often goes away on its own with time and rest. If the pain does not go away, tests may be done to find out what is causing the pain. Follow these instructions at home: Activity  Rest your knee.  Do not do things that cause pain.  Avoid activities where both feet leave the ground at the same time (high-impact activities). Examples are running, jumping rope, and doing jumping jacks. General instructions  Take medicines only as told by your doctor.  Raise (elevate) your knee when you are resting. Make sure your knee is higher than your heart.  Sleep with a pillow under your knee.  If told, put ice on the knee: ? Put ice in a plastic bag. ? Place a towel between your skin and the bag. ? Leave the ice on for 20 minutes, 2-3 times a day.  Ask your doctor if you should wear an elastic knee support.  Lose weight if you are overweight. Being overweight can make your knee hurt more.  Do not use any tobacco products. These include cigarettes, chewing tobacco, or electronic cigarettes. If you need help quitting, ask your doctor. Smoking may slow down healing. Contact a doctor if:  The pain does not stop.  The pain changes or gets worse.  You have a fever along with knee pain.  Your knee gives out or locks up.  Your knee swells, and becomes worse. Get help right away if:  Your knee feels warm.  You cannot move your knee.  You have very bad knee pain.  You have chest pain.  You have trouble breathing. Summary  Many things  can cause knee pain. The pain often goes away on its own with time and rest.  Avoid activities that put stress on your knee. These include running and jumping rope.  Get help right away if you cannot move your knee, or if your knee feels warm, or if you have trouble breathing. This information is not intended to replace advice given to you by your health care provider. Make sure you discuss any questions you have with your health care provider. Document Released: 07/17/2008 Document Revised: 04/14/2016 Document Reviewed: 04/14/2016 Elsevier Interactive Patient Education  2017 [employer].

## 2018-02-24 ENCOUNTER — Ambulatory Visit (INDEPENDENT_AMBULATORY_CARE_PROVIDER_SITE_OTHER): Payer: Medicare Other | Admitting: Psychology

## 2018-02-24 DIAGNOSIS — F411 Generalized anxiety disorder: Secondary | ICD-10-CM

## 2018-02-24 DIAGNOSIS — F341 Dysthymic disorder: Secondary | ICD-10-CM

## 2018-02-25 NOTE — Progress Notes (Signed)
Patient ID: Jared Alvarez, male   DOB: 1950/09/03, 67 y.o.   MRN: [id number] Jared Alvarez is a 67 y.o. . male presents for f/u of CAD , s/p stent to the LAD in 1997 in [place]. Also has DM2, HTN, HLP Gout, depression, anxiety, hypogonadism.  Seen by Dr Caryl Comes for syncope 01/25/18 Two episodes over summer Both with stereotypical prodrome Similar to when he was a teenager. He exercises regularly with no chest pain Dr Caryl Comes felt the effector limb was neurally mediated reflex. With heat and ETOH as trigger. Had stress myovue with positive ECG ( known false positive in past} with normal perfusion EF normal by TTE done 01/06/18 with no significant valve disease and estimated PA pressure 37 mmHg. Holter done 01/06/18 showed PVC burden 12% and night time HR in 40's but no symptoms   BP better on cozaar Having some small kidney stones but no hematuria   ROS: Denies fever, malais, weight loss, blurry vision, decreased visual acuity, cough, sputum, SOB, hemoptysis, pleuritic pain, palpitaitons, heartburn, abdominal pain, melena, lower extremity edema, claudication, or rash.  All other systems reviewed and negative  General: BP 118/72   Pulse 82   Ht 5\' 8"  (1.727 m)   Wt 181 lb (82.1 kg)   SpO2 98%   BMI 27.52 kg/m  Affect appropriate Healthy:  appears stated age 67: normal Neck supple with no adenopathy JVP normal no bruits no thyromegaly Lungs clear with no wheezing and good diaphragmatic motion Heart:  S1/S2 no murmur, no rub, gallop or click PMI normal Abdomen: benighn, BS positve, no tenderness, no AAA no bruit.  No HSM or HJR Distal pulses intact with no bruits No edema Neuro non-focal Skin warm and dry No muscular weakness    Current Outpatient Medications  Medication Sig Dispense Refill  . acetaminophen (TYLENOL) 500 MG tablet Take 1 tablet (500 mg total) by mouth every 6 (six) hours as needed. 30 tablet 2  . amLODipine (NORVASC) 5 MG tablet Take 1 tablet (5 mg total) by mouth  daily. Annual appt due in Oct must see provider for future refills 90 tablet 1  . Ascorbic Acid (VITAMIN C) 1000 MG tablet Take 1 tablet (1,000 mg total) by mouth daily. 1000 tablet 2  . aspirin 81 MG tablet Take 1 tablet (81 mg total) by mouth daily. 30 tablet 2  . Cholecalciferol (VITAMIN D3) 2000 units capsule Take 1 capsule (2,000 Units total) by mouth daily. 100 capsule 2  . colchicine 0.6 MG tablet Take 1 tablet (0.6 mg total) by mouth as needed. If patient has flare up with gout 30 tablet 5  . Cyanocobalamin (VITAMIN B-12) 500 MCG SUBL Place 1 tablet (500 mcg total) under the tongue daily. 150 tablet 2  . FLUoxetine (PROZAC) 40 MG capsule Take 1 capsule (40 mg total) by mouth daily. Annual appt due in Oct must see provider for future refills 90 capsule 1  . glucose blood (ONETOUCH VERIO) test strip Use as instructed 50 each 2  . Homeopathic Products (ARNICARE ARNICA) OINT Apply 1 application topically 2 (two) times daily as needed (arthritis).    . LORazepam (ATIVAN) 1 MG tablet Take 1 tablet (1 mg total) by mouth 2 (two) times daily as needed for anxiety. 60 tablet 2  . losartan (COZAAR) 100 MG tablet Take 1 tablet (100 mg total) by mouth daily. Annual appt due in Oct must see provider for future refills 90 tablet 3  . metFORMIN (GLUCOPHAGE) 1000 MG tablet Take 1  tablet (1,000 mg total) by mouth 2 (two) times daily with a meal. 180 tablet 1  . nitroGLYCERIN (NITROSTAT) 0.4 MG SL tablet Place 1 tablet (0.4 mg total) under the tongue every 5 (five) minutes as needed for chest pain. 25 tablet 3  . ONETOUCH DELICA LANCETS FINE MISC 1 Device by Does not apply route daily as needed. 100 each 3  . pantoprazole (PROTONIX) 40 MG tablet Take 1 tablet (40 mg total) by mouth 2 (two) times daily. Annual appt due in Oct must see provider for future refills 180 tablet 1  . ranitidine (ZANTAC) 150 MG/10ML syrup Take 10 mLs (150 mg total) by mouth as needed for heartburn. 300 mL 2  . rosuvastatin (CRESTOR) 40  MG tablet Take 1 tablet (40 mg total) by mouth daily. Annual appt due in Oct must see provider for future refills 90 tablet 1  . sildenafil (REVATIO) 20 MG tablet Take 1-5 (20-100mg ) tablets by mouth daily as needed 60 tablet 5  . traMADol (ULTRAM) 50 MG tablet Take 1 tablet (50 mg total) by mouth every 6 (six) hours as needed for severe pain. 120 tablet 2   No current facility-administered medications for this visit.     Allergies  Atorvastatin; Bupropion hcl; Duloxetine; Nexium [esomeprazole magnesium]; Simvastatin; Venlafaxine; Viibryd [vilazodone hcl]; Ketorolac; and Tamsulosin  Electrocardiogram:  10/25/12  SR rate 50 normal  11/2013  NSR rate 60 normal  01/24/15  SR rate 53  Nonspecific ST changes  12/11/15  SR rate 55 normal 01/21/17 SR PVC;s read as afib but not   Assessment and Plan CAD:  Distant LAD intervention known history false positive ECG Perfusion images 01/27/18 normal with normal EF by TTE 60-65% Chol:  At goal continue current crestor dosing HTN:  Valsartan changed to cozaar due to recall  Syncope:  F/u Dr Caryl Comes with normal perfusion on heart scan doubt Bezold Jarich reflex responsible for neurally mediated events.  Low night time HR in 40's so beta blocker not ideal. PVC not symptomatic  Renal: f/u plotnicov stay hydrated drink Cranberry juice call primary if stones don't pass  Jenkins Rouge

## 2018-03-02 ENCOUNTER — Encounter: Payer: Self-pay | Admitting: Cardiovascular Disease

## 2018-03-02 ENCOUNTER — Ambulatory Visit (INDEPENDENT_AMBULATORY_CARE_PROVIDER_SITE_OTHER): Payer: Medicare Other | Admitting: Cardiovascular Disease

## 2018-03-02 ENCOUNTER — Other Ambulatory Visit: Payer: Self-pay | Admitting: Internal Medicine

## 2018-03-02 VITALS — BP 118/72 | HR 82 | Ht 68.0 in | Wt 181.0 lb

## 2018-03-02 DIAGNOSIS — I2510 Atherosclerotic heart disease of native coronary artery without angina pectoris: Secondary | ICD-10-CM | POA: Diagnosis not present

## 2018-03-02 DIAGNOSIS — I10 Essential (primary) hypertension: Secondary | ICD-10-CM

## 2018-03-02 DIAGNOSIS — R55 Syncope and collapse: Secondary | ICD-10-CM

## 2018-03-02 MED ORDER — FLUOXETINE HCL 40 MG PO CAPS: 40.0000 mg | ORAL_CAPSULE | Freq: Every day | ORAL | 0 refills | Status: DC

## 2018-03-02 NOTE — Telephone Encounter (Signed)
Copied from [place] [id number]. Topic: Quick Communication - See Telephone Encounter >> Mar 02, 2018 11:05 AM Ivar Drape wrote: CRM for notification. See Telephone encounter for: 03/02/18. Patient would like a refill on his FLUoxetine (PROZAC) 40 MG capsule medication and have it sent to the [facility] [place] for this prescription only.

## 2018-03-02 NOTE — Patient Instructions (Signed)

## 2018-03-09 ENCOUNTER — Ambulatory Visit (INDEPENDENT_AMBULATORY_CARE_PROVIDER_SITE_OTHER): Payer: Medicare Other | Admitting: Psychology

## 2018-03-09 DIAGNOSIS — F341 Dysthymic disorder: Secondary | ICD-10-CM

## 2018-03-09 DIAGNOSIS — F411 Generalized anxiety disorder: Secondary | ICD-10-CM

## 2018-03-16 ENCOUNTER — Ambulatory Visit (INDEPENDENT_AMBULATORY_CARE_PROVIDER_SITE_OTHER): Payer: Medicare Other | Admitting: Psychology

## 2018-03-16 DIAGNOSIS — F411 Generalized anxiety disorder: Secondary | ICD-10-CM

## 2018-03-16 DIAGNOSIS — F341 Dysthymic disorder: Secondary | ICD-10-CM | POA: Diagnosis not present

## 2018-03-21 ENCOUNTER — Ambulatory Visit (INDEPENDENT_AMBULATORY_CARE_PROVIDER_SITE_OTHER): Payer: Medicare Other | Admitting: Psychology

## 2018-03-21 DIAGNOSIS — F341 Dysthymic disorder: Secondary | ICD-10-CM | POA: Diagnosis not present

## 2018-03-21 DIAGNOSIS — F411 Generalized anxiety disorder: Secondary | ICD-10-CM | POA: Diagnosis not present

## 2018-03-30 DIAGNOSIS — H43821 Vitreomacular adhesion, right eye: Secondary | ICD-10-CM | POA: Diagnosis not present

## 2018-03-30 DIAGNOSIS — H43393 Other vitreous opacities, bilateral: Secondary | ICD-10-CM | POA: Diagnosis not present

## 2018-03-30 DIAGNOSIS — H33311 Horseshoe tear of retina without detachment, right eye: Secondary | ICD-10-CM | POA: Diagnosis not present

## 2018-03-30 DIAGNOSIS — H43812 Vitreous degeneration, left eye: Secondary | ICD-10-CM | POA: Diagnosis not present

## 2018-04-13 ENCOUNTER — Ambulatory Visit (INDEPENDENT_AMBULATORY_CARE_PROVIDER_SITE_OTHER): Payer: Medicare Other | Admitting: Psychology

## 2018-04-13 DIAGNOSIS — F411 Generalized anxiety disorder: Secondary | ICD-10-CM | POA: Diagnosis not present

## 2018-04-13 DIAGNOSIS — F341 Dysthymic disorder: Secondary | ICD-10-CM | POA: Diagnosis not present

## 2018-05-17 ENCOUNTER — Encounter: Payer: Self-pay | Admitting: Internal Medicine

## 2018-05-17 ENCOUNTER — Ambulatory Visit (INDEPENDENT_AMBULATORY_CARE_PROVIDER_SITE_OTHER): Payer: Medicare Other | Admitting: Internal Medicine

## 2018-05-17 ENCOUNTER — Other Ambulatory Visit (INDEPENDENT_AMBULATORY_CARE_PROVIDER_SITE_OTHER): Payer: Medicare Other

## 2018-05-17 VITALS — BP 126/82 | HR 46 | Temp 98.3°F | Ht 68.0 in | Wt 178.0 lb

## 2018-05-17 DIAGNOSIS — E1159 Type 2 diabetes mellitus with other circulatory complications: Secondary | ICD-10-CM

## 2018-05-17 DIAGNOSIS — I2510 Atherosclerotic heart disease of native coronary artery without angina pectoris: Secondary | ICD-10-CM

## 2018-05-17 DIAGNOSIS — F5101 Primary insomnia: Secondary | ICD-10-CM | POA: Diagnosis not present

## 2018-05-17 DIAGNOSIS — E538 Deficiency of other specified B group vitamins: Secondary | ICD-10-CM | POA: Diagnosis not present

## 2018-05-17 DIAGNOSIS — I10 Essential (primary) hypertension: Secondary | ICD-10-CM

## 2018-05-17 LAB — CBC WITH DIFFERENTIAL/PLATELET
Basophils Absolute: 0 10*3/uL (ref 0.0–0.1)
Basophils Relative: 0.5 % (ref 0.0–3.0)
Eosinophils Absolute: 0.1 10*3/uL (ref 0.0–0.7)
Eosinophils Relative: 2 % (ref 0.0–5.0)
HCT: 36.8 % — ABNORMAL LOW (ref 39.0–52.0)
Hemoglobin: 12.6 g/dL — ABNORMAL LOW (ref 13.0–17.0)
LYMPHS ABS: 1.1 10*3/uL (ref 0.7–4.0)
Lymphocytes Relative: 21.9 % (ref 12.0–46.0)
MCHC: 34.2 g/dL (ref 30.0–36.0)
MCV: 93.8 fl (ref 78.0–100.0)
MONOS PCT: 12.2 % — AB (ref 3.0–12.0)
Monocytes Absolute: 0.6 10*3/uL (ref 0.1–1.0)
Neutro Abs: 3.1 10*3/uL (ref 1.4–7.7)
Neutrophils Relative %: 63.4 % (ref 43.0–77.0)
PLATELETS: 262 10*3/uL (ref 150.0–400.0)
RBC: 3.92 Mil/uL — ABNORMAL LOW (ref 4.22–5.81)
RDW: 13.4 % (ref 11.5–15.5)
WBC: 4.9 10*3/uL (ref 4.0–10.5)

## 2018-05-17 LAB — BASIC METABOLIC PANEL
BUN: 17 mg/dL (ref 6–23)
CO2: 25 mEq/L (ref 19–32)
Calcium: 9.3 mg/dL (ref 8.4–10.5)
Chloride: 104 mEq/L (ref 96–112)
Creatinine, Ser: 0.87 mg/dL (ref 0.40–1.50)
GFR: 92.87 mL/min (ref >60.00)
Glucose, Bld: 110 mg/dL — ABNORMAL HIGH (ref 70–99)
POTASSIUM: 4.1 meq/L (ref 3.5–5.1)
Sodium: 140 mEq/L (ref 135–145)

## 2018-05-17 LAB — LIPID PANEL
Cholesterol: 150 mg/dL (ref 0–200)
HDL: 63.4 mg/dL (ref >39.00)
LDL Cholesterol: 77 mg/dL (ref 0–99)
NonHDL: 86.2
Total CHOL/HDL Ratio: 2
Triglycerides: 45 mg/dL (ref 0.0–149.0)
VLDL: 9 mg/dL (ref 0.0–40.0)

## 2018-05-17 LAB — HEPATIC FUNCTION PANEL
ALT: 12 U/L (ref 0–53)
AST: 13 U/L (ref 0–37)
Albumin: 4.1 g/dL (ref 3.5–5.2)
Alkaline Phosphatase: 92 U/L (ref 39–117)
Bilirubin, Direct: 0.1 mg/dL (ref 0.0–0.3)
Total Bilirubin: 0.5 mg/dL (ref 0.2–1.2)
Total Protein: 6.7 g/dL (ref 6.0–8.3)

## 2018-05-17 LAB — HEMOGLOBIN A1C: Hgb A1c MFr Bld: 6.7 % — ABNORMAL HIGH (ref 4.6–6.5)

## 2018-05-17 LAB — TSH: TSH: 1.93 u[IU]/mL (ref 0.35–4.50)

## 2018-05-17 MED ORDER — LORAZEPAM 1 MG PO TABS: 1.0000 mg | ORAL_TABLET | Freq: Two times a day (BID) | ORAL | 3 refills | Status: DC | PRN

## 2018-05-17 MED ORDER — ROSUVASTATIN CALCIUM 40 MG PO TABS: 40.0000 mg | ORAL_TABLET | Freq: Every day | ORAL | 3 refills | Status: DC

## 2018-05-17 MED ORDER — LOSARTAN POTASSIUM 100 MG PO TABS: 100.0000 mg | ORAL_TABLET | Freq: Every day | ORAL | 3 refills | Status: DC

## 2018-05-17 MED ORDER — FAMOTIDINE 40 MG PO TABS: 40.0000 mg | ORAL_TABLET | Freq: Every day | ORAL | 3 refills | Status: DC

## 2018-05-17 MED ORDER — PANTOPRAZOLE SODIUM 40 MG PO TBEC: 40.0000 mg | DELAYED_RELEASE_TABLET | Freq: Two times a day (BID) | ORAL | 3 refills | Status: DC

## 2018-05-17 MED ORDER — METFORMIN HCL 1000 MG PO TABS: 1000.0000 mg | ORAL_TABLET | Freq: Two times a day (BID) | ORAL | 3 refills | Status: DC

## 2018-05-17 MED ORDER — FLUOXETINE HCL 40 MG PO CAPS
40.0000 mg | ORAL_CAPSULE | Freq: Every day | ORAL | 3 refills | Status: DC
Start: 2018-05-17 — End: 2019-05-06

## 2018-05-17 MED ORDER — COLCHICINE 0.6 MG PO TABS: 0.6000 mg | ORAL_TABLET | ORAL | 5 refills | Status: DC | PRN

## 2018-05-17 MED ORDER — AMLODIPINE BESYLATE 5 MG PO TABS: 5.0000 mg | ORAL_TABLET | Freq: Every day | ORAL | 3 refills | Status: DC

## 2018-05-17 NOTE — Assessment & Plan Note (Signed)
Lorazepam prn 

## 2018-05-17 NOTE — Assessment & Plan Note (Signed)
Amlodipine, Losartan 

## 2018-05-17 NOTE — Assessment & Plan Note (Signed)
On B12 

## 2018-05-17 NOTE — Assessment & Plan Note (Signed)
On Metformin Check CBG at home prn - Onetouch Verio

## 2018-05-17 NOTE — Progress Notes (Signed)
Subjective:  Patient ID: Jared Alvarez, male    DOB: March 16, 1951  Age: 68 y.o. MRN: [id number]  CC: No chief complaint on file.   HPI [name] presents for CAD, DM, B12 def and gout f/u  Outpatient Medications Prior to Visit  Medication Sig Dispense Refill  . acetaminophen (TYLENOL) 500 MG tablet Take 1 tablet (500 mg total) by mouth every 6 (six) hours as needed. 30 tablet 2  . Ascorbic Acid (VITAMIN C) 1000 MG tablet Take 1 tablet (1,000 mg total) by mouth daily. 1000 tablet 2  . aspirin 81 MG tablet Take 1 tablet (81 mg total) by mouth daily. 30 tablet 2  . Cholecalciferol (VITAMIN D3) 2000 units capsule Take 1 capsule (2,000 Units total) by mouth daily. 100 capsule 2  . Cyanocobalamin (VITAMIN B-12) 500 MCG SUBL Place 1 tablet (500 mcg total) under the tongue daily. 150 tablet 2  . glucose blood (ONETOUCH VERIO) test strip Use as instructed 50 each 2  . Homeopathic Products (ARNICARE ARNICA) OINT Apply 1 application topically 2 (two) times daily as needed (arthritis).    . nitroGLYCERIN (NITROSTAT) 0.4 MG SL tablet Place 1 tablet (0.4 mg total) under the tongue every 5 (five) minutes as needed for chest pain. 25 tablet 3  . ONETOUCH DELICA LANCETS FINE MISC 1 Device by Does not apply route daily as needed. 100 each 3  . sildenafil (REVATIO) 20 MG tablet Take 1-5 (20-100mg ) tablets by mouth daily as needed 60 tablet 5  . traMADol (ULTRAM) 50 MG tablet Take 1 tablet (50 mg total) by mouth every 6 (six) hours as needed for severe pain. 120 tablet 2  . amLODipine (NORVASC) 5 MG tablet Take 1 tablet (5 mg total) by mouth daily. Annual appt due in Oct must see provider for future refills 90 tablet 1  . colchicine 0.6 MG tablet Take 1 tablet (0.6 mg total) by mouth as needed. If patient has flare up with gout 30 tablet 5  . FLUoxetine (PROZAC) 40 MG capsule Take 1 capsule (40 mg total) by mouth daily. Annual appt due in Oct must see provider for future refills 90 capsule 0  .  LORazepam (ATIVAN) 1 MG tablet Take 1 tablet (1 mg total) by mouth 2 (two) times daily as needed for anxiety. 60 tablet 2  . metFORMIN (GLUCOPHAGE) 1000 MG tablet Take 1 tablet (1,000 mg total) by mouth 2 (two) times daily with a meal. 180 tablet 1  . pantoprazole (PROTONIX) 40 MG tablet Take 1 tablet (40 mg total) by mouth 2 (two) times daily. Annual appt due in Oct must see provider for future refills 180 tablet 1  . ranitidine (ZANTAC) 150 MG/10ML syrup Take 10 mLs (150 mg total) by mouth as needed for heartburn. 300 mL 2  . rosuvastatin (CRESTOR) 40 MG tablet Take 1 tablet (40 mg total) by mouth daily. Annual appt due in Oct must see provider for future refills 90 tablet 1  . losartan (COZAAR) 100 MG tablet Take 1 tablet (100 mg total) by mouth daily. Annual appt due in Oct must see provider for future refills 90 tablet 3   No facility-administered medications prior to visit.     ROS: Review of Systems  Constitutional: Negative for appetite change, fatigue and unexpected weight change.  HENT: Negative for congestion, nosebleeds, sneezing, sore throat and trouble swallowing.   Eyes: Negative for itching and visual disturbance.  Respiratory: Negative for cough.   Cardiovascular: Negative for chest pain, palpitations  and leg swelling.  Gastrointestinal: Negative for abdominal distention, blood in stool, diarrhea and nausea.  Genitourinary: Negative for frequency and hematuria.  Musculoskeletal: Positive for arthralgias and gait problem. Negative for back pain, joint swelling and neck pain.  Skin: Negative for rash.  Neurological: Negative for dizziness, tremors, speech difficulty and weakness.  Psychiatric/Behavioral: Negative for agitation, dysphoric mood, sleep disturbance and suicidal ideas. The patient is nervous/anxious.     Objective:  BP 126/82 (BP Location: Left Arm, Patient Position: Sitting, Cuff Size: Normal)   Pulse (!) 46   Temp 98.3 F (36.8 C) (Oral)   Ht 5\' 8"  (1.727 m)    Wt 178 lb (80.7 kg)   SpO2 98%   BMI 27.06 kg/m   BP Readings from Last 3 Encounters:  05/17/18 126/82  03/02/18 118/72  02/17/18 128/82    Wt Readings from Last 3 Encounters:  05/17/18 178 lb (80.7 kg)  03/02/18 181 lb (82.1 kg)  02/17/18 180 lb (81.6 kg)    Physical Exam Constitutional:      General: He is not in acute distress.    Appearance: He is well-developed.     Comments: NAD  Eyes:     Conjunctiva/sclera: Conjunctivae normal.     Pupils: Pupils are equal, round, and reactive to light.  Neck:     Musculoskeletal: Normal range of motion.     Thyroid: No thyromegaly.     Vascular: No JVD.  Cardiovascular:     Rate and Rhythm: Normal rate and regular rhythm.     Heart sounds: Normal heart sounds. No murmur. No friction rub. No gallop.   Pulmonary:     Effort: Pulmonary effort is normal. No respiratory distress.     Breath sounds: Normal breath sounds. No wheezing or rales.  Chest:     Chest wall: No tenderness.  Abdominal:     General: Bowel sounds are normal. There is no distension.     Palpations: Abdomen is soft. There is no mass.     Tenderness: There is no abdominal tenderness. There is no guarding or rebound.  Musculoskeletal: Normal range of motion.        General: No tenderness.  Lymphadenopathy:     Cervical: No cervical adenopathy.  Skin:    General: Skin is warm and dry.     Findings: No rash.  Neurological:     Mental Status: He is alert and oriented to person, place, and time.     Cranial Nerves: No cranial nerve deficit.     Motor: No abnormal muscle tone.     Coordination: Coordination normal.     Gait: Gait normal.     Deep Tendon Reflexes: Reflexes are normal and symmetric.  Psychiatric:        Behavior: Behavior normal.        Thought Content: Thought content normal.        Judgment: Judgment normal.   R heel is tender   AK scalp   Procedure Note :     Procedure : Cryosurgery   Indication:    Actinic keratosis(es)    Risks including unsuccessful procedure , bleeding, infection, bruising, scar, a need for a repeat  procedure and others were explained to the patient in detail as well as the benefits. Informed consent was obtained verbally.    1 lesion(s)  on  scalp  was/were treated with liquid nitrogen on a Q-tip in a usual fasion . Band-Aid was applied and antibiotic ointment was given for a  later use.   Tolerated well. Complications none.   Postprocedure instructions :     Keep the wounds clean. You can wash them with liquid soap and water. Pat dry with gauze or a Kleenex tissue  Before applying antibiotic ointment and a Band-Aid.   You need to report immediately  if  any signs of infection develop.    Lab Results  Component Value Date   WBC 6.2 12/16/2017   HGB 13.7 12/16/2017   HCT 41.2 12/16/2017   PLT 257 12/16/2017   GLUCOSE 169 (H) 12/16/2017   CHOL 159 04/29/2017   TRIG 85.0 04/29/2017   HDL 68.80 04/29/2017   LDLDIRECT 149.6 12/15/2011   LDLCALC 73 04/29/2017   ALT 14 04/29/2017   AST 16 04/29/2017   NA 137 12/16/2017   K 4.1 12/16/2017   CL 102 12/16/2017   CREATININE 1.02 12/16/2017   BUN 22 12/16/2017   CO2 26 12/16/2017   TSH 1.01 12/15/2017   PSA 0.64 09/22/2016   INR 0.98 06/03/2010   HGBA1C 6.7 (H) 12/15/2017   MICROALBUR <0.7 04/29/2017    Dg Knee Complete 4 Views Left  Result Date: 02/17/2018 CLINICAL DATA:  Acute LEFT knee pain, fell recently while walking the dog EXAM: LEFT KNEE - COMPLETE 4+ VIEW COMPARISON:  None FINDINGS: Osseous mineralization normal. Mild medial compartment joint space narrowing. Scattered chondrocalcinosis question CPPD. No acute fracture, dislocation, or bone destruction. Question minimal calcific debris at suprapatellar recess. No knee joint effusion. IMPRESSION: Minimal degenerative changes and suspected CPPD of the LEFT knee with question minimal calcific debris at the suprapatellar recess. No acute abnormalities. Electronically Signed    By: Lavonia Dana M.D.   On: 02/17/2018 16:35    Assessment & Plan:   Diagnoses and all orders for this visit:  DM type 2 causing vascular disease ([place])  Atherosclerosis of native coronary artery of native heart without angina pectoris  Primary insomnia  B12 deficiency  Essential hypertension  Other orders -     famotidine (PEPCID) 40 MG tablet; Take 1 tablet (40 mg total) by mouth at bedtime. -     amLODipine (NORVASC) 5 MG tablet; Take 1 tablet (5 mg total) by mouth daily. Annual appt due in Oct must see provider for future refills -     colchicine 0.6 MG tablet; Take 1 tablet (0.6 mg total) by mouth as needed (2 a day prn gout). If patient has flare up with gout -     FLUoxetine (PROZAC) 40 MG capsule; Take 1 capsule (40 mg total) by mouth daily. Annual appt due in Oct must see provider for future refills -     LORazepam (ATIVAN) 1 MG tablet; Take 1 tablet (1 mg total) by mouth 2 (two) times daily as needed for anxiety. -     metFORMIN (GLUCOPHAGE) 1000 MG tablet; Take 1 tablet (1,000 mg total) by mouth 2 (two) times daily with a meal. -     pantoprazole (PROTONIX) 40 MG tablet; Take 1 tablet (40 mg total) by mouth 2 (two) times daily. Annual appt due in Oct must see provider for future refills -     rosuvastatin (CRESTOR) 40 MG tablet; Take 1 tablet (40 mg total) by mouth daily. Annual appt due in Oct must see provider for future refills -     losartan (COZAAR) 100 MG tablet; Take 1 tablet (100 mg total) by mouth daily. Annual appt due in Oct must see provider for future refills     Meds  ordered this encounter  Medications  . famotidine (PEPCID) 40 MG tablet    Sig: Take 1 tablet (40 mg total) by mouth at bedtime.    Dispense:  90 tablet    Refill:  3  . amLODipine (NORVASC) 5 MG tablet    Sig: Take 1 tablet (5 mg total) by mouth daily. Annual appt due in Oct must see provider for future refills    Dispense:  90 tablet    Refill:  3  . colchicine 0.6 MG tablet    Sig: Take  1 tablet (0.6 mg total) by mouth as needed (2 a day prn gout). If patient has flare up with gout    Dispense:  10 tablet    Refill:  5  . FLUoxetine (PROZAC) 40 MG capsule    Sig: Take 1 capsule (40 mg total) by mouth daily. Annual appt due in Oct must see provider for future refills    Dispense:  90 capsule    Refill:  3  . LORazepam (ATIVAN) 1 MG tablet    Sig: Take 1 tablet (1 mg total) by mouth 2 (two) times daily as needed for anxiety.    Dispense:  60 tablet    Refill:  3  . metFORMIN (GLUCOPHAGE) 1000 MG tablet    Sig: Take 1 tablet (1,000 mg total) by mouth 2 (two) times daily with a meal.    Dispense:  180 tablet    Refill:  3  . pantoprazole (PROTONIX) 40 MG tablet    Sig: Take 1 tablet (40 mg total) by mouth 2 (two) times daily. Annual appt due in Oct must see provider for future refills    Dispense:  180 tablet    Refill:  3  . rosuvastatin (CRESTOR) 40 MG tablet    Sig: Take 1 tablet (40 mg total) by mouth daily. Annual appt due in Oct must see provider for future refills    Dispense:  90 tablet    Refill:  3  . losartan (COZAAR) 100 MG tablet    Sig: Take 1 tablet (100 mg total) by mouth daily. Annual appt due in Oct must see provider for future refills    Dispense:  90 tablet    Refill:  3     Follow-up: No follow-ups on file.  Walker Kehr, MD

## 2018-05-17 NOTE — Assessment & Plan Note (Signed)
ASA, Crestor 

## 2018-06-02 ENCOUNTER — Ambulatory Visit: Payer: Medicare Other | Admitting: Psychology

## 2018-07-01 ENCOUNTER — Ambulatory Visit: Payer: Self-pay | Admitting: Family

## 2018-08-17 ENCOUNTER — Ambulatory Visit (INDEPENDENT_AMBULATORY_CARE_PROVIDER_SITE_OTHER): Payer: Medicare Other | Admitting: Psychology

## 2018-08-17 DIAGNOSIS — F411 Generalized anxiety disorder: Secondary | ICD-10-CM | POA: Diagnosis not present

## 2018-08-17 DIAGNOSIS — F341 Dysthymic disorder: Secondary | ICD-10-CM

## 2018-08-24 ENCOUNTER — Ambulatory Visit (INDEPENDENT_AMBULATORY_CARE_PROVIDER_SITE_OTHER): Payer: Medicare Other | Admitting: Psychology

## 2018-08-24 DIAGNOSIS — F341 Dysthymic disorder: Secondary | ICD-10-CM | POA: Diagnosis not present

## 2018-08-24 DIAGNOSIS — F411 Generalized anxiety disorder: Secondary | ICD-10-CM

## 2018-08-31 ENCOUNTER — Ambulatory Visit (INDEPENDENT_AMBULATORY_CARE_PROVIDER_SITE_OTHER): Payer: Medicare Other | Admitting: Psychology

## 2018-08-31 DIAGNOSIS — F341 Dysthymic disorder: Secondary | ICD-10-CM | POA: Diagnosis not present

## 2018-08-31 DIAGNOSIS — F411 Generalized anxiety disorder: Secondary | ICD-10-CM | POA: Diagnosis not present

## 2018-09-07 ENCOUNTER — Ambulatory Visit (INDEPENDENT_AMBULATORY_CARE_PROVIDER_SITE_OTHER): Payer: Medicare Other | Admitting: Psychology

## 2018-09-07 DIAGNOSIS — F411 Generalized anxiety disorder: Secondary | ICD-10-CM | POA: Diagnosis not present

## 2018-09-07 DIAGNOSIS — F341 Dysthymic disorder: Secondary | ICD-10-CM | POA: Diagnosis not present

## 2018-09-13 ENCOUNTER — Ambulatory Visit (INDEPENDENT_AMBULATORY_CARE_PROVIDER_SITE_OTHER): Payer: Medicare Other | Admitting: Psychology

## 2018-09-13 DIAGNOSIS — F411 Generalized anxiety disorder: Secondary | ICD-10-CM | POA: Diagnosis not present

## 2018-09-13 DIAGNOSIS — F341 Dysthymic disorder: Secondary | ICD-10-CM | POA: Diagnosis not present

## 2018-09-21 ENCOUNTER — Ambulatory Visit (INDEPENDENT_AMBULATORY_CARE_PROVIDER_SITE_OTHER): Payer: Medicare Other | Admitting: Psychology

## 2018-09-21 ENCOUNTER — Telehealth: Payer: Self-pay | Admitting: Physician Assistant

## 2018-09-21 DIAGNOSIS — F411 Generalized anxiety disorder: Secondary | ICD-10-CM | POA: Diagnosis not present

## 2018-09-21 DIAGNOSIS — F341 Dysthymic disorder: Secondary | ICD-10-CM

## 2018-09-21 NOTE — Telephone Encounter (Signed)
Spoke with patient who confirmed all demographics. Patient has a smart phone and uses My chart. Will have vitals ready for visit.

## 2018-09-28 ENCOUNTER — Encounter: Payer: Self-pay | Admitting: Physician Assistant

## 2018-09-28 ENCOUNTER — Other Ambulatory Visit: Payer: Self-pay

## 2018-09-28 ENCOUNTER — Telehealth (INDEPENDENT_AMBULATORY_CARE_PROVIDER_SITE_OTHER): Payer: Medicare Other | Admitting: Physician Assistant

## 2018-09-28 ENCOUNTER — Telehealth: Payer: Self-pay

## 2018-09-28 VITALS — BP 141/80 | HR 61 | Ht 68.0 in | Wt 175.0 lb

## 2018-09-28 DIAGNOSIS — E785 Hyperlipidemia, unspecified: Secondary | ICD-10-CM

## 2018-09-28 DIAGNOSIS — I2510 Atherosclerotic heart disease of native coronary artery without angina pectoris: Secondary | ICD-10-CM

## 2018-09-28 DIAGNOSIS — I10 Essential (primary) hypertension: Secondary | ICD-10-CM

## 2018-09-28 DIAGNOSIS — R001 Bradycardia, unspecified: Secondary | ICD-10-CM

## 2018-09-28 MED ORDER — AMLODIPINE BESYLATE 10 MG PO TABS: 10.0000 mg | ORAL_TABLET | Freq: Every day | ORAL | 3 refills | Status: DC

## 2018-09-28 NOTE — Progress Notes (Signed)
Virtual Visit via Telephone Note   This visit type was conducted due to national recommendations for restrictions regarding the COVID-19 Pandemic (e.g. social distancing) in an effort to limit this patient's exposure and mitigate transmission in our community.  Due to his co-morbid illnesses, this patient is at least at moderate risk for complications without adequate follow up.  This format is felt to be most appropriate for this patient at this time.  The patient did not have access to video technology/had technical difficulties with video requiring transitioning to audio format only (telephone).  All issues noted in this document were discussed and addressed.  No physical exam could be performed with this format.  Please refer to the patient's chart for his  consent to telehealth for [facility].   Date:  09/28/2018   ID:  Jared Alvarez, DOB 1951/02/28, MRN [id number]  Patient Location: Home Provider Location: Office  PCP:  Cassandria Anger, MD  Cardiologist:  Jenkins Rouge, MD   Evaluation Performed:  Follow-Up Visit  Chief Complaint:  6 months follow up   History of Present Illness:    Jared Alvarez is a 68 y.o. male with hx of CAD s/p stent to the LAD in 1997 in [place], HTN, HLD, DM, Gout, depression, anxiety, ascending thoracic aortic aneurysm and syncope and  Hypogonadism seen for follow up.  Seen by Dr Caryl Comes for syncope 01/25/18 for two episodes over summer. Both with stereotypical prodrome similar to when he was a teenager. He exercises regularly with no chest pain. Dr Caryl Comes felt the effector limb was neurally mediated reflex with heat and ETOH as trigger. Had stress myovue 01/27/18 with positive ECG ( known false positive in past} with normal perfusion.  TTE  01/06/18  showed normal LVEF with no significant valve disease and estimated PA pressure 37 mmHg. Holter done 01/06/18 showed PVC burden 12% and night time HR in 40's but no symptoms.  [place] but unable to do so due to pandemic. Currently he walks 2 miles few times/week. No limiting symptoms. He denies chest pain, palpitations, dizziness, orthopnea, PND, LE edema or melena. No recurrent syncope.   The patient does not have symptoms concerning for COVID-19 infection (fever, chills, cough, or new shortness of breath).    Past Medical History:  Diagnosis Date  . Allergy   . Anxiety    no per pt  . Coronary artery disease   . Depression    no per pt  . DM type 2 (diabetes mellitus, type 2) ([place])   . GERD (gastroesophageal reflux disease)   . Gout   . Hearing loss   . Hyperlipidemia   . Hypertension   . Hypogonadism male   . Inguinal hernia    right  . LBP (low back pain)   . Leg cramps   . Osteoarthritis   . Skin neoplasm   . Tinnitus   . Vertigo   . Vitamin B12 deficiency    Past Surgical History:  Procedure Laterality Date  . heart stent     in 1997  . INGUINAL HERNIA REPAIR       Current Meds  Medication Sig  . acetaminophen (TYLENOL) 500 MG tablet Take 1 tablet (500 mg total) by mouth every 6 (six) hours as needed.  [name] aspirin 81 MG tablet Take 1 tablet (81 mg total) by mouth daily.  . Cholecalciferol (VITAMIN D3) 2000 units capsule Take 1 capsule (2,000 Units  total) by mouth daily.  . colchicine 0.6 MG tablet Take 1 tablet (0.6 mg total) by mouth as needed (2 a day prn gout). If patient has flare up with gout  . Cyanocobalamin (VITAMIN B-12) 500 MCG SUBL Place 1 tablet (500 mcg total) under the tongue daily.  . famotidine (PEPCID) 40 MG tablet Take 1 tablet (40 mg total) by mouth at bedtime.  [name] FLUoxetine (PROZAC) 40 MG capsule Take 1 capsule (40 mg total) by mouth daily. Annual appt due in Oct must see provider for future refills  . glucose blood (ONETOUCH VERIO) test strip Use as instructed  . Homeopathic Products (ARNICARE ARNICA) OINT Apply 1 application topically 2 (two) times daily as needed (arthritis).  . LORazepam (ATIVAN) 1 MG tablet Take 1  tablet (1 mg total) by mouth 2 (two) times daily as needed for anxiety.  [name] losartan (COZAAR) 100 MG tablet Take 1 tablet (100 mg total) by mouth daily. Annual appt due in Oct must see provider for future refills  . metFORMIN (GLUCOPHAGE) 1000 MG tablet Take 1 tablet (1,000 mg total) by mouth 2 (two) times daily with a meal.  . nitroGLYCERIN (NITROSTAT) 0.4 MG SL tablet Place 1 tablet (0.4 mg total) under the tongue every 5 (five) minutes as needed for chest pain.  Glory Rosebush DELICA LANCETS FINE MISC 1 Device by Does not apply route daily as needed.  . pantoprazole (PROTONIX) 40 MG tablet Take 1 tablet (40 mg total) by mouth 2 (two) times daily. Annual appt due in Oct must see provider for future refills  . rosuvastatin (CRESTOR) 40 MG tablet Take 40 mg by mouth daily.   . sildenafil (REVATIO) 20 MG tablet Take 1-5 (20-100mg ) tablets by mouth daily as needed  . traMADol (ULTRAM) 50 MG tablet Take 1 tablet (50 mg total) by mouth every 6 (six) hours as needed for severe pain.  . [DISCONTINUED] amLODipine (NORVASC) 5 MG tablet Take 1 tablet (5 mg total) by mouth daily. Annual appt due in Oct must see provider for future refills     Allergies:   Atorvastatin; Bupropion hcl; Duloxetine; Nexium [esomeprazole magnesium]; Simvastatin; Venlafaxine; Viibryd [vilazodone hcl]; Ketorolac; and Tamsulosin   Social History   Tobacco Use  . Smoking status: Never Smoker  . Smokeless tobacco: Never Used  Substance Use Topics  . Alcohol use: Yes    Alcohol/week: 7.0 standard drinks    Types: 7 Glasses of wine per week  . Drug use: No     Family Hx: The patient's family history includes Arthritis in his sister; Coronary artery disease in an other family member; Heart disease in his brother and father; Hypertension in an other family member; Lupus in his sister. There is no history of Colon cancer, Esophageal cancer, Stomach cancer, or Rectal cancer.  ROS:   Please see the history of present illness.    All  other systems reviewed and are negative.   Prior CV studies:   The following studies were reviewed today:  As summarized above  Labs/Other Tests and Data Reviewed:    EKG:  No ECG reviewed.  Recent Labs: 05/17/2018: ALT 12; BUN 17; Creatinine, Ser 0.87; Hemoglobin 12.6; Platelets 262.0; Potassium 4.1; Sodium 140; TSH 1.93   Recent Lipid Panel Lab Results  Component Value Date/Time   CHOL 150 05/17/2018 12:01 PM   TRIG 45.0 05/17/2018 12:01 PM   HDL 63.40 05/17/2018 12:01 PM   CHOLHDL 2 05/17/2018 12:01 PM   LDLCALC 77 05/17/2018 12:01 PM  LDLDIRECT 149.6 12/15/2011 03:26 PM    Wt Readings from Last 3 Encounters:  09/28/18 175 lb (79.4 kg)  05/17/18 178 lb (80.7 kg)  03/02/18 181 lb (82.1 kg)     Objective:    Vital Signs:  BP (!) 141/80   Pulse 61   Ht 5\' 8"  (1.727 m)   Wt 175 lb (79.4 kg)   BMI 26.61 kg/m    VITAL SIGNS:  reviewed GEN:  no acute distress PSYCH:  normal affect  ASSESSMENT & PLAN:    1. CAD - No angina. Continue exercise regimen. Continue ASA and statin. No BB given baseline bradycardia.   2. HTN - His SBP always runs above 140s. Discussed strict BP control given hx of aneurysm. Increase Amlodipine to 10mg  daily. Continue losartan at current dose.   3. HLD - Hx of myalgia on daily dose of Crestor. No issue 3 times/week. Followed by PCP.   4. Ascending thoracic aortic aneurysm - 4.2cm by CT 12/2017. As above.   5. Noctural bradycardia - Noted on monitor and he noted as well. Avoid AV blocking agent.   COVID-19 Education: The signs and symptoms of COVID-19 were discussed with the patient and how to seek care for testing (follow up with PCP or arrange E-visit).  The importance of social distancing was discussed today.  Time:   Today, I have spent 10 minutes with the patient with telehealth technology discussing the above problems.     Medication Adjustments/Labs and Tests Ordered: Current medicines are reviewed at length with the  patient today.  Concerns regarding medicines are outlined above.   Tests Ordered: No orders of the defined types were placed in this encounter.   Medication Changes: Meds ordered this encounter  Medications  . amLODipine (NORVASC) 10 MG tablet    Sig: Take 1 tablet (10 mg total) by mouth daily.    Dispense:  90 tablet    Refill:  3    Disposition:  Follow up in 6 month(s)  Signed, Leanor Kail, PA  09/28/2018 1:35 PM    [facility]

## 2018-09-28 NOTE — Patient Instructions (Signed)
Medication Instructions:  Your physician has recommended you make the following change in your medication:  1. INCREASE AMLODIPINE TO 10 MG DAILY.  If you need a refill on your cardiac medications before your next appointment, please call your pharmacy.   Lab work: NONE If you have labs (blood work) drawn today and your tests are completely normal, you will receive your results only by: [name] MyChart Message (if you have MyChart) OR . A paper copy in the mail If you have any lab test that is abnormal or we need to change your treatment, we will call you to review the results.  Testing/Procedures: NONE  Follow-Up: At [facility], you and your health needs are our priority.  As part of our continuing mission to provide you with exceptional heart care, we have created designated Provider Care Teams.  These Care Teams include your primary Cardiologist (physician) and Advanced Practice Providers (APPs -  Physician Assistants and Nurse Practitioners) who all work together to provide you with the care you need, when you need it. You will need a follow up appointment in 6 months.  Please call our office 2 months in advance to schedule this appointment.  You may see Jenkins Rouge, MD or one of the following Advanced Practice Providers on your designated Care Team:   Truitt Merle, NP Cecilie Kicks, NP . Kathyrn Drown, NP

## 2018-09-30 ENCOUNTER — Ambulatory Visit: Payer: Medicare Other | Admitting: Psychology

## 2018-09-30 NOTE — Telephone Encounter (Signed)
error 

## 2018-10-05 ENCOUNTER — Ambulatory Visit (INDEPENDENT_AMBULATORY_CARE_PROVIDER_SITE_OTHER): Payer: Medicare Other | Admitting: Psychology

## 2018-10-05 DIAGNOSIS — F341 Dysthymic disorder: Secondary | ICD-10-CM | POA: Diagnosis not present

## 2018-10-05 DIAGNOSIS — F411 Generalized anxiety disorder: Secondary | ICD-10-CM | POA: Diagnosis not present

## 2018-10-11 ENCOUNTER — Ambulatory Visit (INDEPENDENT_AMBULATORY_CARE_PROVIDER_SITE_OTHER): Payer: Medicare Other | Admitting: Psychology

## 2018-10-11 DIAGNOSIS — F411 Generalized anxiety disorder: Secondary | ICD-10-CM | POA: Diagnosis not present

## 2018-10-11 DIAGNOSIS — F341 Dysthymic disorder: Secondary | ICD-10-CM | POA: Diagnosis not present

## 2018-10-25 ENCOUNTER — Ambulatory Visit (INDEPENDENT_AMBULATORY_CARE_PROVIDER_SITE_OTHER): Payer: Medicare Other | Admitting: Psychology

## 2018-10-25 DIAGNOSIS — F341 Dysthymic disorder: Secondary | ICD-10-CM | POA: Diagnosis not present

## 2018-10-25 DIAGNOSIS — F411 Generalized anxiety disorder: Secondary | ICD-10-CM | POA: Diagnosis not present

## 2018-11-20 ENCOUNTER — Other Ambulatory Visit: Payer: Self-pay | Admitting: Internal Medicine

## 2018-11-20 MED ORDER — TADALAFIL 20 MG PO TABS: 20.0000 mg | ORAL_TABLET | ORAL | 5 refills | Status: DC | PRN

## 2018-11-29 ENCOUNTER — Ambulatory Visit (INDEPENDENT_AMBULATORY_CARE_PROVIDER_SITE_OTHER): Payer: Medicare Other | Admitting: Psychology

## 2018-11-29 DIAGNOSIS — F341 Dysthymic disorder: Secondary | ICD-10-CM | POA: Diagnosis not present

## 2018-11-29 DIAGNOSIS — F411 Generalized anxiety disorder: Secondary | ICD-10-CM | POA: Diagnosis not present

## 2018-12-13 ENCOUNTER — Ambulatory Visit (INDEPENDENT_AMBULATORY_CARE_PROVIDER_SITE_OTHER): Payer: Medicare Other | Admitting: Psychology

## 2018-12-13 DIAGNOSIS — F341 Dysthymic disorder: Secondary | ICD-10-CM

## 2018-12-13 DIAGNOSIS — F411 Generalized anxiety disorder: Secondary | ICD-10-CM

## 2018-12-23 DIAGNOSIS — Z23 Encounter for immunization: Secondary | ICD-10-CM | POA: Diagnosis not present

## 2018-12-26 ENCOUNTER — Ambulatory Visit (INDEPENDENT_AMBULATORY_CARE_PROVIDER_SITE_OTHER): Payer: Medicare Other | Admitting: Psychology

## 2018-12-26 DIAGNOSIS — F341 Dysthymic disorder: Secondary | ICD-10-CM

## 2018-12-26 DIAGNOSIS — F411 Generalized anxiety disorder: Secondary | ICD-10-CM

## 2019-01-11 DIAGNOSIS — M216X1 Other acquired deformities of right foot: Secondary | ICD-10-CM | POA: Diagnosis not present

## 2019-01-11 DIAGNOSIS — M76821 Posterior tibial tendinitis, right leg: Secondary | ICD-10-CM | POA: Diagnosis not present

## 2019-01-11 DIAGNOSIS — M216X2 Other acquired deformities of left foot: Secondary | ICD-10-CM | POA: Diagnosis not present

## 2019-01-11 DIAGNOSIS — R2241 Localized swelling, mass and lump, right lower limb: Secondary | ICD-10-CM | POA: Diagnosis not present

## 2019-01-16 DIAGNOSIS — H33311 Horseshoe tear of retina without detachment, right eye: Secondary | ICD-10-CM | POA: Diagnosis not present

## 2019-01-16 DIAGNOSIS — H43813 Vitreous degeneration, bilateral: Secondary | ICD-10-CM | POA: Diagnosis not present

## 2019-01-16 DIAGNOSIS — H2513 Age-related nuclear cataract, bilateral: Secondary | ICD-10-CM | POA: Diagnosis not present

## 2019-01-16 DIAGNOSIS — H43821 Vitreomacular adhesion, right eye: Secondary | ICD-10-CM | POA: Diagnosis not present

## 2019-01-23 DIAGNOSIS — Z1159 Encounter for screening for other viral diseases: Secondary | ICD-10-CM | POA: Diagnosis not present

## 2019-01-23 DIAGNOSIS — Z03818 Encounter for observation for suspected exposure to other biological agents ruled out: Secondary | ICD-10-CM | POA: Diagnosis not present

## 2019-01-24 ENCOUNTER — Ambulatory Visit: Payer: Medicare Other | Admitting: Psychology

## 2019-02-03 ENCOUNTER — Other Ambulatory Visit: Payer: Self-pay | Admitting: Internal Medicine

## 2019-02-07 ENCOUNTER — Ambulatory Visit (INDEPENDENT_AMBULATORY_CARE_PROVIDER_SITE_OTHER): Payer: Medicare Other | Admitting: Psychology

## 2019-02-07 DIAGNOSIS — F411 Generalized anxiety disorder: Secondary | ICD-10-CM

## 2019-02-07 DIAGNOSIS — F341 Dysthymic disorder: Secondary | ICD-10-CM | POA: Diagnosis not present

## 2019-02-16 DIAGNOSIS — M7661 Achilles tendinitis, right leg: Secondary | ICD-10-CM | POA: Diagnosis not present

## 2019-02-16 DIAGNOSIS — M7989 Other specified soft tissue disorders: Secondary | ICD-10-CM | POA: Diagnosis not present

## 2019-02-16 DIAGNOSIS — M722 Plantar fascial fibromatosis: Secondary | ICD-10-CM | POA: Diagnosis not present

## 2019-02-16 DIAGNOSIS — M2141 Flat foot [pes planus] (acquired), right foot: Secondary | ICD-10-CM | POA: Diagnosis not present

## 2019-02-16 DIAGNOSIS — M25471 Effusion, right ankle: Secondary | ICD-10-CM | POA: Diagnosis not present

## 2019-02-16 DIAGNOSIS — M19071 Primary osteoarthritis, right ankle and foot: Secondary | ICD-10-CM | POA: Diagnosis not present

## 2019-02-24 DIAGNOSIS — R0982 Postnasal drip: Secondary | ICD-10-CM | POA: Diagnosis not present

## 2019-02-24 DIAGNOSIS — R490 Dysphonia: Secondary | ICD-10-CM | POA: Diagnosis not present

## 2019-02-24 DIAGNOSIS — E119 Type 2 diabetes mellitus without complications: Secondary | ICD-10-CM | POA: Diagnosis not present

## 2019-02-24 DIAGNOSIS — H9192 Unspecified hearing loss, left ear: Secondary | ICD-10-CM | POA: Diagnosis not present

## 2019-03-01 DIAGNOSIS — M216X1 Other acquired deformities of right foot: Secondary | ICD-10-CM | POA: Diagnosis not present

## 2019-03-01 DIAGNOSIS — R2241 Localized swelling, mass and lump, right lower limb: Secondary | ICD-10-CM | POA: Diagnosis not present

## 2019-03-01 DIAGNOSIS — R944 Abnormal results of kidney function studies: Secondary | ICD-10-CM | POA: Insufficient documentation

## 2019-03-01 DIAGNOSIS — M216X2 Other acquired deformities of left foot: Secondary | ICD-10-CM | POA: Diagnosis not present

## 2019-03-01 DIAGNOSIS — M76821 Posterior tibial tendinitis, right leg: Secondary | ICD-10-CM | POA: Diagnosis not present

## 2019-03-03 DIAGNOSIS — H903 Sensorineural hearing loss, bilateral: Secondary | ICD-10-CM | POA: Diagnosis not present

## 2019-03-03 DIAGNOSIS — R490 Dysphonia: Secondary | ICD-10-CM | POA: Diagnosis not present

## 2019-03-03 DIAGNOSIS — H9312 Tinnitus, left ear: Secondary | ICD-10-CM | POA: Diagnosis not present

## 2019-03-15 DIAGNOSIS — M76821 Posterior tibial tendinitis, right leg: Secondary | ICD-10-CM | POA: Diagnosis not present

## 2019-03-15 DIAGNOSIS — M216X1 Other acquired deformities of right foot: Secondary | ICD-10-CM | POA: Diagnosis not present

## 2019-03-15 DIAGNOSIS — M216X2 Other acquired deformities of left foot: Secondary | ICD-10-CM | POA: Diagnosis not present

## 2019-03-15 DIAGNOSIS — R2241 Localized swelling, mass and lump, right lower limb: Secondary | ICD-10-CM | POA: Diagnosis not present

## 2019-03-21 DIAGNOSIS — R001 Bradycardia, unspecified: Secondary | ICD-10-CM | POA: Insufficient documentation

## 2019-03-21 DIAGNOSIS — Z8739 Personal history of other diseases of the musculoskeletal system and connective tissue: Secondary | ICD-10-CM | POA: Insufficient documentation

## 2019-03-21 DIAGNOSIS — I493 Ventricular premature depolarization: Secondary | ICD-10-CM | POA: Insufficient documentation

## 2019-03-21 DIAGNOSIS — Z87898 Personal history of other specified conditions: Secondary | ICD-10-CM | POA: Insufficient documentation

## 2019-03-21 DIAGNOSIS — E7800 Pure hypercholesterolemia, unspecified: Secondary | ICD-10-CM | POA: Insufficient documentation

## 2019-03-21 DIAGNOSIS — Z8659 Personal history of other mental and behavioral disorders: Secondary | ICD-10-CM | POA: Insufficient documentation

## 2019-03-21 DIAGNOSIS — E785 Hyperlipidemia, unspecified: Secondary | ICD-10-CM | POA: Insufficient documentation

## 2019-03-21 DIAGNOSIS — I7789 Other specified disorders of arteries and arterioles: Secondary | ICD-10-CM | POA: Insufficient documentation

## 2019-03-22 ENCOUNTER — Telehealth: Payer: Self-pay | Admitting: Cardiovascular Disease

## 2019-03-22 NOTE — Telephone Encounter (Signed)
Jared Alvarez from [facility] called. The patient recently moved to [place] and has his first appointment scheduled with the new practice tomorrow. The office will need recent office notes, records and EKGs from the patient before the new provider will see him. The office will not see the patient without records from his former cardiologist.    The patient is scheduled to see Dr. Hosie Poisson   Records can be faxed to: [phone]

## 2019-03-23 DIAGNOSIS — I2510 Atherosclerotic heart disease of native coronary artery without angina pectoris: Secondary | ICD-10-CM | POA: Diagnosis not present

## 2019-03-23 DIAGNOSIS — Z87898 Personal history of other specified conditions: Secondary | ICD-10-CM | POA: Diagnosis not present

## 2019-03-23 DIAGNOSIS — Z20828 Contact with and (suspected) exposure to other viral communicable diseases: Secondary | ICD-10-CM | POA: Diagnosis not present

## 2019-03-23 DIAGNOSIS — E118 Type 2 diabetes mellitus with unspecified complications: Secondary | ICD-10-CM | POA: Diagnosis not present

## 2019-03-23 DIAGNOSIS — I7789 Other specified disorders of arteries and arterioles: Secondary | ICD-10-CM | POA: Diagnosis not present

## 2019-03-23 DIAGNOSIS — I10 Essential (primary) hypertension: Secondary | ICD-10-CM | POA: Diagnosis not present

## 2019-03-23 DIAGNOSIS — E7800 Pure hypercholesterolemia, unspecified: Secondary | ICD-10-CM | POA: Diagnosis not present

## 2019-04-20 ENCOUNTER — Ambulatory Visit (INDEPENDENT_AMBULATORY_CARE_PROVIDER_SITE_OTHER): Payer: Medicare Other | Admitting: Psychology

## 2019-04-20 DIAGNOSIS — F411 Generalized anxiety disorder: Secondary | ICD-10-CM

## 2019-04-20 DIAGNOSIS — F341 Dysthymic disorder: Secondary | ICD-10-CM

## 2019-05-04 ENCOUNTER — Other Ambulatory Visit: Payer: Self-pay | Admitting: Internal Medicine

## 2019-05-09 DIAGNOSIS — E118 Type 2 diabetes mellitus with unspecified complications: Secondary | ICD-10-CM | POA: Diagnosis not present

## 2019-05-09 DIAGNOSIS — G8929 Other chronic pain: Secondary | ICD-10-CM | POA: Diagnosis not present

## 2019-05-09 DIAGNOSIS — Z7189 Other specified counseling: Secondary | ICD-10-CM | POA: Diagnosis not present

## 2019-05-09 DIAGNOSIS — Z125 Encounter for screening for malignant neoplasm of prostate: Secondary | ICD-10-CM | POA: Diagnosis not present

## 2019-05-09 DIAGNOSIS — K219 Gastro-esophageal reflux disease without esophagitis: Secondary | ICD-10-CM | POA: Diagnosis not present

## 2019-05-09 DIAGNOSIS — Z23 Encounter for immunization: Secondary | ICD-10-CM | POA: Diagnosis not present

## 2019-05-09 DIAGNOSIS — I10 Essential (primary) hypertension: Secondary | ICD-10-CM | POA: Diagnosis not present

## 2019-05-09 DIAGNOSIS — Z0000 Encounter for general adult medical examination without abnormal findings: Secondary | ICD-10-CM | POA: Diagnosis not present

## 2019-05-09 DIAGNOSIS — Z8659 Personal history of other mental and behavioral disorders: Secondary | ICD-10-CM | POA: Diagnosis not present

## 2019-05-09 DIAGNOSIS — Z1159 Encounter for screening for other viral diseases: Secondary | ICD-10-CM | POA: Diagnosis not present

## 2019-05-09 DIAGNOSIS — M19012 Primary osteoarthritis, left shoulder: Secondary | ICD-10-CM | POA: Diagnosis not present

## 2019-05-09 DIAGNOSIS — I7789 Other specified disorders of arteries and arterioles: Secondary | ICD-10-CM | POA: Diagnosis not present

## 2019-05-09 DIAGNOSIS — M25512 Pain in left shoulder: Secondary | ICD-10-CM | POA: Diagnosis not present

## 2019-05-09 DIAGNOSIS — R319 Hematuria, unspecified: Secondary | ICD-10-CM | POA: Diagnosis not present

## 2019-05-09 DIAGNOSIS — Z2883 Immunization not carried out due to unavailability of vaccine: Secondary | ICD-10-CM | POA: Diagnosis not present

## 2019-05-09 DIAGNOSIS — N529 Male erectile dysfunction, unspecified: Secondary | ICD-10-CM | POA: Diagnosis not present

## 2019-05-11 DIAGNOSIS — I7789 Other specified disorders of arteries and arterioles: Secondary | ICD-10-CM | POA: Diagnosis not present

## 2019-05-11 DIAGNOSIS — I2510 Atherosclerotic heart disease of native coronary artery without angina pectoris: Secondary | ICD-10-CM | POA: Diagnosis not present

## 2019-05-21 DIAGNOSIS — R55 Syncope and collapse: Secondary | ICD-10-CM | POA: Diagnosis not present

## 2019-05-21 DIAGNOSIS — I2510 Atherosclerotic heart disease of native coronary artery without angina pectoris: Secondary | ICD-10-CM | POA: Diagnosis not present

## 2019-05-21 DIAGNOSIS — Z888 Allergy status to other drugs, medicaments and biological substances status: Secondary | ICD-10-CM | POA: Diagnosis not present

## 2019-05-21 DIAGNOSIS — E860 Dehydration: Secondary | ICD-10-CM | POA: Diagnosis not present

## 2019-05-21 DIAGNOSIS — E119 Type 2 diabetes mellitus without complications: Secondary | ICD-10-CM | POA: Diagnosis not present

## 2019-05-21 DIAGNOSIS — Z91013 Allergy to seafood: Secondary | ICD-10-CM | POA: Diagnosis not present

## 2019-05-21 DIAGNOSIS — R079 Chest pain, unspecified: Secondary | ICD-10-CM | POA: Diagnosis not present

## 2019-05-21 DIAGNOSIS — H9192 Unspecified hearing loss, left ear: Secondary | ICD-10-CM | POA: Diagnosis not present

## 2019-05-21 DIAGNOSIS — J9811 Atelectasis: Secondary | ICD-10-CM | POA: Diagnosis not present

## 2019-05-21 DIAGNOSIS — I10 Essential (primary) hypertension: Secondary | ICD-10-CM | POA: Diagnosis not present

## 2019-05-21 DIAGNOSIS — N400 Enlarged prostate without lower urinary tract symptoms: Secondary | ICD-10-CM | POA: Diagnosis not present

## 2019-05-21 DIAGNOSIS — Z955 Presence of coronary angioplasty implant and graft: Secondary | ICD-10-CM | POA: Diagnosis not present

## 2019-05-21 DIAGNOSIS — Z885 Allergy status to narcotic agent status: Secondary | ICD-10-CM | POA: Diagnosis not present

## 2019-05-21 DIAGNOSIS — Z743 Need for continuous supervision: Secondary | ICD-10-CM | POA: Diagnosis not present

## 2019-05-21 DIAGNOSIS — I712 Thoracic aortic aneurysm, without rupture: Secondary | ICD-10-CM | POA: Diagnosis not present

## 2019-05-22 DIAGNOSIS — I712 Thoracic aortic aneurysm, without rupture: Secondary | ICD-10-CM | POA: Diagnosis not present

## 2019-06-08 DIAGNOSIS — Z20828 Contact with and (suspected) exposure to other viral communicable diseases: Secondary | ICD-10-CM | POA: Diagnosis not present

## 2019-06-08 DIAGNOSIS — M76821 Posterior tibial tendinitis, right leg: Secondary | ICD-10-CM | POA: Diagnosis not present

## 2019-06-08 DIAGNOSIS — M19071 Primary osteoarthritis, right ankle and foot: Secondary | ICD-10-CM | POA: Diagnosis not present

## 2019-06-08 DIAGNOSIS — M2021 Hallux rigidus, right foot: Secondary | ICD-10-CM | POA: Diagnosis not present

## 2019-06-27 DIAGNOSIS — M76821 Posterior tibial tendinitis, right leg: Secondary | ICD-10-CM | POA: Diagnosis not present

## 2019-06-27 DIAGNOSIS — M19071 Primary osteoarthritis, right ankle and foot: Secondary | ICD-10-CM | POA: Diagnosis not present

## 2019-06-27 DIAGNOSIS — M2021 Hallux rigidus, right foot: Secondary | ICD-10-CM | POA: Diagnosis not present

## 2019-07-04 DIAGNOSIS — L821 Other seborrheic keratosis: Secondary | ICD-10-CM | POA: Diagnosis not present

## 2019-07-04 DIAGNOSIS — Z23 Encounter for immunization: Secondary | ICD-10-CM | POA: Diagnosis not present

## 2019-07-04 DIAGNOSIS — Z719 Counseling, unspecified: Secondary | ICD-10-CM | POA: Diagnosis not present

## 2019-07-04 DIAGNOSIS — L814 Other melanin hyperpigmentation: Secondary | ICD-10-CM | POA: Diagnosis not present

## 2019-07-04 DIAGNOSIS — L820 Inflamed seborrheic keratosis: Secondary | ICD-10-CM | POA: Diagnosis not present

## 2019-07-05 ENCOUNTER — Ambulatory Visit (INDEPENDENT_AMBULATORY_CARE_PROVIDER_SITE_OTHER): Payer: Medicare Other | Admitting: Psychology

## 2019-07-05 DIAGNOSIS — F341 Dysthymic disorder: Secondary | ICD-10-CM | POA: Diagnosis not present

## 2019-07-05 DIAGNOSIS — F411 Generalized anxiety disorder: Secondary | ICD-10-CM | POA: Diagnosis not present

## 2019-07-10 DIAGNOSIS — M2021 Hallux rigidus, right foot: Secondary | ICD-10-CM | POA: Diagnosis not present

## 2019-07-10 DIAGNOSIS — E119 Type 2 diabetes mellitus without complications: Secondary | ICD-10-CM | POA: Diagnosis not present

## 2019-07-10 DIAGNOSIS — I10 Essential (primary) hypertension: Secondary | ICD-10-CM | POA: Diagnosis not present

## 2019-07-10 DIAGNOSIS — Z01818 Encounter for other preprocedural examination: Secondary | ICD-10-CM | POA: Diagnosis not present

## 2019-07-10 DIAGNOSIS — I2510 Atherosclerotic heart disease of native coronary artery without angina pectoris: Secondary | ICD-10-CM | POA: Diagnosis not present

## 2019-07-10 DIAGNOSIS — Z125 Encounter for screening for malignant neoplasm of prostate: Secondary | ICD-10-CM | POA: Diagnosis not present

## 2019-07-10 DIAGNOSIS — Z1159 Encounter for screening for other viral diseases: Secondary | ICD-10-CM | POA: Diagnosis not present

## 2019-07-10 DIAGNOSIS — M2141 Flat foot [pes planus] (acquired), right foot: Secondary | ICD-10-CM | POA: Diagnosis not present

## 2019-07-10 DIAGNOSIS — M2142 Flat foot [pes planus] (acquired), left foot: Secondary | ICD-10-CM | POA: Diagnosis not present

## 2019-07-10 DIAGNOSIS — M76821 Posterior tibial tendinitis, right leg: Secondary | ICD-10-CM | POA: Diagnosis not present

## 2019-07-10 DIAGNOSIS — E118 Type 2 diabetes mellitus with unspecified complications: Secondary | ICD-10-CM | POA: Diagnosis not present

## 2019-07-10 DIAGNOSIS — Z0000 Encounter for general adult medical examination without abnormal findings: Secondary | ICD-10-CM | POA: Diagnosis not present

## 2019-07-25 DIAGNOSIS — M19071 Primary osteoarthritis, right ankle and foot: Secondary | ICD-10-CM | POA: Diagnosis not present

## 2019-07-25 DIAGNOSIS — S46111A Strain of muscle, fascia and tendon of long head of biceps, right arm, initial encounter: Secondary | ICD-10-CM | POA: Diagnosis not present

## 2019-07-25 DIAGNOSIS — S86111A Strain of other muscle(s) and tendon(s) of posterior muscle group at lower leg level, right leg, initial encounter: Secondary | ICD-10-CM | POA: Diagnosis not present

## 2019-07-25 DIAGNOSIS — M24571 Contracture, right ankle: Secondary | ICD-10-CM | POA: Diagnosis not present

## 2019-07-25 DIAGNOSIS — M2141 Flat foot [pes planus] (acquired), right foot: Secondary | ICD-10-CM | POA: Diagnosis not present

## 2019-07-25 DIAGNOSIS — M76821 Posterior tibial tendinitis, right leg: Secondary | ICD-10-CM | POA: Diagnosis not present

## 2019-07-25 DIAGNOSIS — G8918 Other acute postprocedural pain: Secondary | ICD-10-CM | POA: Diagnosis not present

## 2019-07-25 DIAGNOSIS — M62461 Contracture of muscle, right lower leg: Secondary | ICD-10-CM | POA: Diagnosis not present

## 2019-08-01 DIAGNOSIS — Z23 Encounter for immunization: Secondary | ICD-10-CM | POA: Diagnosis not present

## 2019-08-14 DIAGNOSIS — H2513 Age-related nuclear cataract, bilateral: Secondary | ICD-10-CM | POA: Insufficient documentation

## 2019-08-16 DIAGNOSIS — M25512 Pain in left shoulder: Secondary | ICD-10-CM | POA: Diagnosis not present

## 2019-08-16 DIAGNOSIS — G8929 Other chronic pain: Secondary | ICD-10-CM | POA: Diagnosis not present

## 2019-08-16 DIAGNOSIS — N401 Enlarged prostate with lower urinary tract symptoms: Secondary | ICD-10-CM | POA: Diagnosis not present

## 2019-08-16 DIAGNOSIS — H43812 Vitreous degeneration, left eye: Secondary | ICD-10-CM | POA: Insufficient documentation

## 2019-08-16 DIAGNOSIS — Z8659 Personal history of other mental and behavioral disorders: Secondary | ICD-10-CM | POA: Diagnosis not present

## 2019-08-16 DIAGNOSIS — H43393 Other vitreous opacities, bilateral: Secondary | ICD-10-CM | POA: Insufficient documentation

## 2019-08-16 DIAGNOSIS — E118 Type 2 diabetes mellitus with unspecified complications: Secondary | ICD-10-CM | POA: Diagnosis not present

## 2019-08-16 DIAGNOSIS — H33311 Horseshoe tear of retina without detachment, right eye: Secondary | ICD-10-CM | POA: Insufficient documentation

## 2019-08-16 DIAGNOSIS — I10 Essential (primary) hypertension: Secondary | ICD-10-CM | POA: Diagnosis not present

## 2019-08-16 DIAGNOSIS — K219 Gastro-esophageal reflux disease without esophagitis: Secondary | ICD-10-CM | POA: Diagnosis not present

## 2019-08-16 DIAGNOSIS — H43821 Vitreomacular adhesion, right eye: Secondary | ICD-10-CM | POA: Insufficient documentation

## 2019-09-01 NOTE — Telephone Encounter (Signed)
Documented covid vaccinations.Marland KitchenJohny Alvarez

## 2019-09-07 ENCOUNTER — Ambulatory Visit (INDEPENDENT_AMBULATORY_CARE_PROVIDER_SITE_OTHER): Payer: Medicare Other | Admitting: Psychology

## 2019-09-07 DIAGNOSIS — F341 Dysthymic disorder: Secondary | ICD-10-CM

## 2019-09-07 DIAGNOSIS — F411 Generalized anxiety disorder: Secondary | ICD-10-CM | POA: Diagnosis not present

## 2019-09-12 ENCOUNTER — Ambulatory Visit (INDEPENDENT_AMBULATORY_CARE_PROVIDER_SITE_OTHER): Payer: Medicare Other | Admitting: Psychology

## 2019-09-12 DIAGNOSIS — F411 Generalized anxiety disorder: Secondary | ICD-10-CM

## 2019-09-12 DIAGNOSIS — F341 Dysthymic disorder: Secondary | ICD-10-CM

## 2019-09-15 DIAGNOSIS — M19071 Primary osteoarthritis, right ankle and foot: Secondary | ICD-10-CM | POA: Diagnosis not present

## 2019-09-15 DIAGNOSIS — M76821 Posterior tibial tendinitis, right leg: Secondary | ICD-10-CM | POA: Diagnosis not present

## 2019-09-28 DIAGNOSIS — M19012 Primary osteoarthritis, left shoulder: Secondary | ICD-10-CM | POA: Diagnosis not present

## 2019-09-28 DIAGNOSIS — M7502 Adhesive capsulitis of left shoulder: Secondary | ICD-10-CM | POA: Diagnosis not present

## 2019-10-26 DIAGNOSIS — M76821 Posterior tibial tendinitis, right leg: Secondary | ICD-10-CM | POA: Diagnosis not present

## 2019-10-26 DIAGNOSIS — M2021 Hallux rigidus, right foot: Secondary | ICD-10-CM | POA: Diagnosis not present

## 2019-10-26 DIAGNOSIS — M19071 Primary osteoarthritis, right ankle and foot: Secondary | ICD-10-CM | POA: Diagnosis not present

## 2019-11-08 DIAGNOSIS — R269 Unspecified abnormalities of gait and mobility: Secondary | ICD-10-CM | POA: Diagnosis not present

## 2019-11-08 DIAGNOSIS — M79601 Pain in right arm: Secondary | ICD-10-CM | POA: Diagnosis not present

## 2019-11-15 DIAGNOSIS — M79601 Pain in right arm: Secondary | ICD-10-CM | POA: Diagnosis not present

## 2019-11-15 DIAGNOSIS — R269 Unspecified abnormalities of gait and mobility: Secondary | ICD-10-CM | POA: Diagnosis not present

## 2019-11-17 DIAGNOSIS — R269 Unspecified abnormalities of gait and mobility: Secondary | ICD-10-CM | POA: Diagnosis not present

## 2019-11-17 DIAGNOSIS — M79601 Pain in right arm: Secondary | ICD-10-CM | POA: Diagnosis not present

## 2019-11-22 DIAGNOSIS — R269 Unspecified abnormalities of gait and mobility: Secondary | ICD-10-CM | POA: Diagnosis not present

## 2019-11-22 DIAGNOSIS — M79601 Pain in right arm: Secondary | ICD-10-CM | POA: Diagnosis not present

## 2019-11-24 DIAGNOSIS — M79601 Pain in right arm: Secondary | ICD-10-CM | POA: Diagnosis not present

## 2019-11-24 DIAGNOSIS — R269 Unspecified abnormalities of gait and mobility: Secondary | ICD-10-CM | POA: Diagnosis not present

## 2019-11-27 DIAGNOSIS — R269 Unspecified abnormalities of gait and mobility: Secondary | ICD-10-CM | POA: Diagnosis not present

## 2019-11-27 DIAGNOSIS — M79601 Pain in right arm: Secondary | ICD-10-CM | POA: Diagnosis not present

## 2019-11-28 DIAGNOSIS — M79671 Pain in right foot: Secondary | ICD-10-CM | POA: Diagnosis not present

## 2019-11-28 DIAGNOSIS — R269 Unspecified abnormalities of gait and mobility: Secondary | ICD-10-CM | POA: Diagnosis not present

## 2019-12-08 DIAGNOSIS — M79671 Pain in right foot: Secondary | ICD-10-CM | POA: Diagnosis not present

## 2019-12-08 DIAGNOSIS — R269 Unspecified abnormalities of gait and mobility: Secondary | ICD-10-CM | POA: Diagnosis not present

## 2019-12-11 DIAGNOSIS — M79671 Pain in right foot: Secondary | ICD-10-CM | POA: Diagnosis not present

## 2019-12-11 DIAGNOSIS — R269 Unspecified abnormalities of gait and mobility: Secondary | ICD-10-CM | POA: Diagnosis not present

## 2019-12-12 DIAGNOSIS — R269 Unspecified abnormalities of gait and mobility: Secondary | ICD-10-CM | POA: Diagnosis not present

## 2019-12-12 DIAGNOSIS — M79671 Pain in right foot: Secondary | ICD-10-CM | POA: Diagnosis not present

## 2019-12-14 DIAGNOSIS — M2021 Hallux rigidus, right foot: Secondary | ICD-10-CM | POA: Diagnosis not present

## 2019-12-14 DIAGNOSIS — Z981 Arthrodesis status: Secondary | ICD-10-CM | POA: Diagnosis not present

## 2019-12-14 DIAGNOSIS — M76821 Posterior tibial tendinitis, right leg: Secondary | ICD-10-CM | POA: Diagnosis not present

## 2019-12-14 DIAGNOSIS — M19071 Primary osteoarthritis, right ankle and foot: Secondary | ICD-10-CM | POA: Diagnosis not present

## 2019-12-18 DIAGNOSIS — R0982 Postnasal drip: Secondary | ICD-10-CM | POA: Diagnosis not present

## 2019-12-18 DIAGNOSIS — G4710 Hypersomnia, unspecified: Secondary | ICD-10-CM | POA: Diagnosis not present

## 2019-12-18 DIAGNOSIS — L989 Disorder of the skin and subcutaneous tissue, unspecified: Secondary | ICD-10-CM | POA: Diagnosis not present

## 2019-12-18 DIAGNOSIS — E119 Type 2 diabetes mellitus without complications: Secondary | ICD-10-CM | POA: Diagnosis not present

## 2019-12-18 DIAGNOSIS — E559 Vitamin D deficiency, unspecified: Secondary | ICD-10-CM | POA: Diagnosis not present

## 2019-12-18 DIAGNOSIS — Z8659 Personal history of other mental and behavioral disorders: Secondary | ICD-10-CM | POA: Diagnosis not present

## 2019-12-24 DIAGNOSIS — D531 Other megaloblastic anemias, not elsewhere classified: Secondary | ICD-10-CM | POA: Insufficient documentation

## 2019-12-24 DIAGNOSIS — D649 Anemia, unspecified: Secondary | ICD-10-CM | POA: Insufficient documentation

## 2019-12-27 DIAGNOSIS — D649 Anemia, unspecified: Secondary | ICD-10-CM | POA: Diagnosis not present

## 2019-12-27 DIAGNOSIS — D539 Nutritional anemia, unspecified: Secondary | ICD-10-CM | POA: Diagnosis not present

## 2019-12-27 DIAGNOSIS — M79671 Pain in right foot: Secondary | ICD-10-CM | POA: Diagnosis not present

## 2019-12-27 DIAGNOSIS — R269 Unspecified abnormalities of gait and mobility: Secondary | ICD-10-CM | POA: Diagnosis not present

## 2020-01-01 DIAGNOSIS — R269 Unspecified abnormalities of gait and mobility: Secondary | ICD-10-CM | POA: Diagnosis not present

## 2020-01-01 DIAGNOSIS — M79671 Pain in right foot: Secondary | ICD-10-CM | POA: Diagnosis not present

## 2020-01-03 DIAGNOSIS — R269 Unspecified abnormalities of gait and mobility: Secondary | ICD-10-CM | POA: Diagnosis not present

## 2020-01-03 DIAGNOSIS — M79671 Pain in right foot: Secondary | ICD-10-CM | POA: Diagnosis not present

## 2020-01-17 DIAGNOSIS — H33311 Horseshoe tear of retina without detachment, right eye: Secondary | ICD-10-CM | POA: Diagnosis not present

## 2020-01-17 DIAGNOSIS — H43821 Vitreomacular adhesion, right eye: Secondary | ICD-10-CM | POA: Diagnosis not present

## 2020-01-17 DIAGNOSIS — H43813 Vitreous degeneration, bilateral: Secondary | ICD-10-CM | POA: Diagnosis not present

## 2020-01-18 DIAGNOSIS — I7789 Other specified disorders of arteries and arterioles: Secondary | ICD-10-CM | POA: Diagnosis not present

## 2020-01-18 DIAGNOSIS — I2510 Atherosclerotic heart disease of native coronary artery without angina pectoris: Secondary | ICD-10-CM | POA: Diagnosis not present

## 2020-01-18 DIAGNOSIS — R001 Bradycardia, unspecified: Secondary | ICD-10-CM | POA: Diagnosis not present

## 2020-01-18 DIAGNOSIS — I493 Ventricular premature depolarization: Secondary | ICD-10-CM | POA: Diagnosis not present

## 2020-01-18 DIAGNOSIS — E7800 Pure hypercholesterolemia, unspecified: Secondary | ICD-10-CM | POA: Diagnosis not present

## 2020-01-18 DIAGNOSIS — Z87898 Personal history of other specified conditions: Secondary | ICD-10-CM | POA: Diagnosis not present

## 2020-01-18 DIAGNOSIS — I10 Essential (primary) hypertension: Secondary | ICD-10-CM | POA: Diagnosis not present

## 2020-01-22 DIAGNOSIS — Z23 Encounter for immunization: Secondary | ICD-10-CM | POA: Diagnosis not present

## 2020-01-24 DIAGNOSIS — M75112 Incomplete rotator cuff tear or rupture of left shoulder, not specified as traumatic: Secondary | ICD-10-CM | POA: Diagnosis not present

## 2020-01-24 DIAGNOSIS — M659 Synovitis and tenosynovitis, unspecified: Secondary | ICD-10-CM | POA: Diagnosis not present

## 2020-01-24 DIAGNOSIS — M67814 Other specified disorders of tendon, left shoulder: Secondary | ICD-10-CM | POA: Diagnosis not present

## 2020-01-24 DIAGNOSIS — M19012 Primary osteoarthritis, left shoulder: Secondary | ICD-10-CM | POA: Diagnosis not present

## 2020-01-25 DIAGNOSIS — M19012 Primary osteoarthritis, left shoulder: Secondary | ICD-10-CM | POA: Diagnosis not present

## 2020-01-30 DIAGNOSIS — R971 Elevated cancer antigen 125 [CA 125]: Secondary | ICD-10-CM | POA: Diagnosis not present

## 2020-01-30 DIAGNOSIS — D539 Nutritional anemia, unspecified: Secondary | ICD-10-CM | POA: Diagnosis not present

## 2020-02-20 ENCOUNTER — Ambulatory Visit (INDEPENDENT_AMBULATORY_CARE_PROVIDER_SITE_OTHER): Payer: Medicare Other | Admitting: Psychology

## 2020-02-20 DIAGNOSIS — F341 Dysthymic disorder: Secondary | ICD-10-CM

## 2020-02-20 DIAGNOSIS — F411 Generalized anxiety disorder: Secondary | ICD-10-CM | POA: Diagnosis not present

## 2020-02-23 DIAGNOSIS — Z23 Encounter for immunization: Secondary | ICD-10-CM | POA: Diagnosis not present

## 2020-02-27 ENCOUNTER — Ambulatory Visit (INDEPENDENT_AMBULATORY_CARE_PROVIDER_SITE_OTHER): Payer: Medicare Other | Admitting: Psychology

## 2020-02-27 DIAGNOSIS — D539 Nutritional anemia, unspecified: Secondary | ICD-10-CM | POA: Diagnosis not present

## 2020-02-27 DIAGNOSIS — F411 Generalized anxiety disorder: Secondary | ICD-10-CM

## 2020-02-27 DIAGNOSIS — F341 Dysthymic disorder: Secondary | ICD-10-CM | POA: Diagnosis not present

## 2020-03-05 ENCOUNTER — Ambulatory Visit (INDEPENDENT_AMBULATORY_CARE_PROVIDER_SITE_OTHER): Payer: Medicare Other | Admitting: Psychology

## 2020-03-05 DIAGNOSIS — F411 Generalized anxiety disorder: Secondary | ICD-10-CM | POA: Diagnosis not present

## 2020-03-05 DIAGNOSIS — T466X5A Adverse effect of antihyperlipidemic and antiarteriosclerotic drugs, initial encounter: Secondary | ICD-10-CM | POA: Insufficient documentation

## 2020-03-05 DIAGNOSIS — F341 Dysthymic disorder: Secondary | ICD-10-CM

## 2020-03-12 ENCOUNTER — Ambulatory Visit (INDEPENDENT_AMBULATORY_CARE_PROVIDER_SITE_OTHER): Payer: Medicare Other | Admitting: Psychology

## 2020-03-12 DIAGNOSIS — F341 Dysthymic disorder: Secondary | ICD-10-CM

## 2020-03-12 DIAGNOSIS — F411 Generalized anxiety disorder: Secondary | ICD-10-CM | POA: Diagnosis not present

## 2020-03-14 DIAGNOSIS — M19071 Primary osteoarthritis, right ankle and foot: Secondary | ICD-10-CM | POA: Diagnosis not present

## 2020-03-14 DIAGNOSIS — Z4789 Encounter for other orthopedic aftercare: Secondary | ICD-10-CM | POA: Diagnosis not present

## 2020-03-14 DIAGNOSIS — Z981 Arthrodesis status: Secondary | ICD-10-CM | POA: Diagnosis not present

## 2020-03-14 DIAGNOSIS — M2021 Hallux rigidus, right foot: Secondary | ICD-10-CM | POA: Diagnosis not present

## 2020-03-21 ENCOUNTER — Ambulatory Visit (INDEPENDENT_AMBULATORY_CARE_PROVIDER_SITE_OTHER): Payer: Medicare Other | Admitting: Psychology

## 2020-03-21 DIAGNOSIS — F341 Dysthymic disorder: Secondary | ICD-10-CM

## 2020-03-21 DIAGNOSIS — F411 Generalized anxiety disorder: Secondary | ICD-10-CM | POA: Diagnosis not present

## 2020-04-02 ENCOUNTER — Ambulatory Visit (INDEPENDENT_AMBULATORY_CARE_PROVIDER_SITE_OTHER): Payer: Medicare Other | Admitting: Psychology

## 2020-04-02 DIAGNOSIS — F411 Generalized anxiety disorder: Secondary | ICD-10-CM | POA: Diagnosis not present

## 2020-04-02 DIAGNOSIS — R490 Dysphonia: Secondary | ICD-10-CM | POA: Diagnosis not present

## 2020-04-02 DIAGNOSIS — F341 Dysthymic disorder: Secondary | ICD-10-CM

## 2020-04-02 DIAGNOSIS — H903 Sensorineural hearing loss, bilateral: Secondary | ICD-10-CM | POA: Diagnosis not present

## 2020-04-02 DIAGNOSIS — H9312 Tinnitus, left ear: Secondary | ICD-10-CM | POA: Diagnosis not present

## 2020-04-18 ENCOUNTER — Ambulatory Visit: Payer: Medicare Other | Admitting: Psychology

## 2020-04-23 DIAGNOSIS — Z20822 Contact with and (suspected) exposure to covid-19: Secondary | ICD-10-CM | POA: Diagnosis not present

## 2020-04-24 ENCOUNTER — Ambulatory Visit: Payer: Medicare Other | Admitting: Psychology

## 2020-05-17 DIAGNOSIS — M960 Pseudarthrosis after fusion or arthrodesis: Secondary | ICD-10-CM | POA: Diagnosis not present

## 2020-05-17 DIAGNOSIS — T8484XA Pain due to internal orthopedic prosthetic devices, implants and grafts, initial encounter: Secondary | ICD-10-CM | POA: Diagnosis not present

## 2020-05-17 DIAGNOSIS — M19071 Primary osteoarthritis, right ankle and foot: Secondary | ICD-10-CM | POA: Diagnosis not present

## 2020-05-17 DIAGNOSIS — M2021 Hallux rigidus, right foot: Secondary | ICD-10-CM | POA: Diagnosis not present

## 2020-05-28 DIAGNOSIS — Z0000 Encounter for general adult medical examination without abnormal findings: Secondary | ICD-10-CM | POA: Diagnosis not present

## 2020-05-28 DIAGNOSIS — D531 Other megaloblastic anemias, not elsewhere classified: Secondary | ICD-10-CM | POA: Diagnosis not present

## 2020-05-28 DIAGNOSIS — N529 Male erectile dysfunction, unspecified: Secondary | ICD-10-CM | POA: Diagnosis not present

## 2020-05-28 DIAGNOSIS — R1313 Dysphagia, pharyngeal phase: Secondary | ICD-10-CM | POA: Insufficient documentation

## 2020-05-28 DIAGNOSIS — G4700 Insomnia, unspecified: Secondary | ICD-10-CM | POA: Diagnosis not present

## 2020-05-28 DIAGNOSIS — Z23 Encounter for immunization: Secondary | ICD-10-CM | POA: Diagnosis not present

## 2020-05-28 DIAGNOSIS — I7789 Other specified disorders of arteries and arterioles: Secondary | ICD-10-CM | POA: Diagnosis not present

## 2020-05-28 DIAGNOSIS — E118 Type 2 diabetes mellitus with unspecified complications: Secondary | ICD-10-CM | POA: Diagnosis not present

## 2020-05-28 DIAGNOSIS — I10 Essential (primary) hypertension: Secondary | ICD-10-CM | POA: Diagnosis not present

## 2020-06-06 DIAGNOSIS — M19071 Primary osteoarthritis, right ankle and foot: Secondary | ICD-10-CM | POA: Diagnosis not present

## 2020-06-06 DIAGNOSIS — M2021 Hallux rigidus, right foot: Secondary | ICD-10-CM | POA: Diagnosis not present

## 2020-06-06 DIAGNOSIS — T8484XA Pain due to internal orthopedic prosthetic devices, implants and grafts, initial encounter: Secondary | ICD-10-CM | POA: Diagnosis not present

## 2020-06-06 DIAGNOSIS — M960 Pseudarthrosis after fusion or arthrodesis: Secondary | ICD-10-CM | POA: Diagnosis not present

## 2020-06-12 ENCOUNTER — Encounter: Payer: Self-pay | Admitting: Internal Medicine

## 2020-07-10 DIAGNOSIS — E559 Vitamin D deficiency, unspecified: Secondary | ICD-10-CM | POA: Diagnosis not present

## 2020-07-15 DIAGNOSIS — Z79899 Other long term (current) drug therapy: Secondary | ICD-10-CM | POA: Diagnosis not present

## 2020-07-15 DIAGNOSIS — Z8659 Personal history of other mental and behavioral disorders: Secondary | ICD-10-CM | POA: Diagnosis not present

## 2020-07-15 DIAGNOSIS — F411 Generalized anxiety disorder: Secondary | ICD-10-CM | POA: Diagnosis not present

## 2020-07-24 ENCOUNTER — Ambulatory Visit (INDEPENDENT_AMBULATORY_CARE_PROVIDER_SITE_OTHER): Payer: Medicare Other | Admitting: Psychology

## 2020-07-24 DIAGNOSIS — F341 Dysthymic disorder: Secondary | ICD-10-CM

## 2020-07-24 DIAGNOSIS — F411 Generalized anxiety disorder: Secondary | ICD-10-CM

## 2020-08-01 DIAGNOSIS — E118 Type 2 diabetes mellitus with unspecified complications: Secondary | ICD-10-CM | POA: Diagnosis not present

## 2020-08-01 DIAGNOSIS — N401 Enlarged prostate with lower urinary tract symptoms: Secondary | ICD-10-CM | POA: Diagnosis not present

## 2020-08-01 DIAGNOSIS — Z01818 Encounter for other preprocedural examination: Secondary | ICD-10-CM | POA: Diagnosis not present

## 2020-08-01 DIAGNOSIS — I10 Essential (primary) hypertension: Secondary | ICD-10-CM | POA: Diagnosis not present

## 2020-08-01 DIAGNOSIS — Z8659 Personal history of other mental and behavioral disorders: Secondary | ICD-10-CM | POA: Diagnosis not present

## 2020-08-01 DIAGNOSIS — K219 Gastro-esophageal reflux disease without esophagitis: Secondary | ICD-10-CM | POA: Diagnosis not present

## 2020-08-01 DIAGNOSIS — T733XXA Exhaustion due to excessive exertion, initial encounter: Secondary | ICD-10-CM | POA: Diagnosis not present

## 2020-08-02 HISTORY — PX: FOOT SURGERY: SHX648

## 2020-08-13 DIAGNOSIS — M19049 Primary osteoarthritis, unspecified hand: Secondary | ICD-10-CM | POA: Diagnosis not present

## 2020-08-13 DIAGNOSIS — M960 Pseudarthrosis after fusion or arthrodesis: Secondary | ICD-10-CM | POA: Diagnosis not present

## 2020-08-13 DIAGNOSIS — E119 Type 2 diabetes mellitus without complications: Secondary | ICD-10-CM | POA: Diagnosis not present

## 2020-08-13 DIAGNOSIS — I9581 Postprocedural hypotension: Secondary | ICD-10-CM | POA: Diagnosis not present

## 2020-08-13 DIAGNOSIS — I10 Essential (primary) hypertension: Secondary | ICD-10-CM | POA: Diagnosis not present

## 2020-08-13 DIAGNOSIS — E7800 Pure hypercholesterolemia, unspecified: Secondary | ICD-10-CM | POA: Diagnosis not present

## 2020-08-13 DIAGNOSIS — M9689 Other intraoperative and postprocedural complications and disorders of the musculoskeletal system: Secondary | ICD-10-CM | POA: Insufficient documentation

## 2020-08-13 DIAGNOSIS — T8484XA Pain due to internal orthopedic prosthetic devices, implants and grafts, initial encounter: Secondary | ICD-10-CM | POA: Diagnosis not present

## 2020-08-13 DIAGNOSIS — N401 Enlarged prostate with lower urinary tract symptoms: Secondary | ICD-10-CM | POA: Insufficient documentation

## 2020-08-13 DIAGNOSIS — Z955 Presence of coronary angioplasty implant and graft: Secondary | ICD-10-CM | POA: Diagnosis not present

## 2020-08-13 DIAGNOSIS — Z888 Allergy status to other drugs, medicaments and biological substances status: Secondary | ICD-10-CM | POA: Diagnosis not present

## 2020-08-13 DIAGNOSIS — Z79899 Other long term (current) drug therapy: Secondary | ICD-10-CM | POA: Diagnosis not present

## 2020-08-13 DIAGNOSIS — Z91013 Allergy to seafood: Secondary | ICD-10-CM | POA: Diagnosis not present

## 2020-08-13 DIAGNOSIS — K219 Gastro-esophageal reflux disease without esophagitis: Secondary | ICD-10-CM | POA: Diagnosis not present

## 2020-08-13 DIAGNOSIS — R350 Frequency of micturition: Secondary | ICD-10-CM | POA: Diagnosis not present

## 2020-08-13 DIAGNOSIS — M19079 Primary osteoarthritis, unspecified ankle and foot: Secondary | ICD-10-CM | POA: Insufficient documentation

## 2020-08-13 DIAGNOSIS — G8918 Other acute postprocedural pain: Secondary | ICD-10-CM | POA: Diagnosis not present

## 2020-08-13 DIAGNOSIS — H903 Sensorineural hearing loss, bilateral: Secondary | ICD-10-CM | POA: Diagnosis not present

## 2020-08-13 DIAGNOSIS — F3341 Major depressive disorder, recurrent, in partial remission: Secondary | ICD-10-CM | POA: Diagnosis not present

## 2020-08-13 DIAGNOSIS — M19071 Primary osteoarthritis, right ankle and foot: Secondary | ICD-10-CM | POA: Diagnosis not present

## 2020-08-13 DIAGNOSIS — M1A9XX1 Chronic gout, unspecified, with tophus (tophi): Secondary | ICD-10-CM | POA: Diagnosis not present

## 2020-08-13 DIAGNOSIS — Z7984 Long term (current) use of oral hypoglycemic drugs: Secondary | ICD-10-CM | POA: Diagnosis not present

## 2020-08-13 DIAGNOSIS — Z886 Allergy status to analgesic agent status: Secondary | ICD-10-CM | POA: Diagnosis not present

## 2020-08-13 DIAGNOSIS — I2510 Atherosclerotic heart disease of native coronary artery without angina pectoris: Secondary | ICD-10-CM | POA: Diagnosis not present

## 2020-08-13 DIAGNOSIS — M2021 Hallux rigidus, right foot: Secondary | ICD-10-CM | POA: Insufficient documentation

## 2020-08-13 DIAGNOSIS — F411 Generalized anxiety disorder: Secondary | ICD-10-CM | POA: Diagnosis not present

## 2020-08-14 DIAGNOSIS — M9689 Other intraoperative and postprocedural complications and disorders of the musculoskeletal system: Secondary | ICD-10-CM | POA: Diagnosis not present

## 2020-08-14 DIAGNOSIS — T8484XA Pain due to internal orthopedic prosthetic devices, implants and grafts, initial encounter: Secondary | ICD-10-CM | POA: Diagnosis not present

## 2020-08-14 DIAGNOSIS — I10 Essential (primary) hypertension: Secondary | ICD-10-CM | POA: Diagnosis not present

## 2020-08-14 DIAGNOSIS — G8918 Other acute postprocedural pain: Secondary | ICD-10-CM | POA: Diagnosis not present

## 2020-08-14 DIAGNOSIS — M1A9XX1 Chronic gout, unspecified, with tophus (tophi): Secondary | ICD-10-CM | POA: Diagnosis not present

## 2020-08-14 DIAGNOSIS — M19071 Primary osteoarthritis, right ankle and foot: Secondary | ICD-10-CM | POA: Diagnosis not present

## 2020-08-14 DIAGNOSIS — M960 Pseudarthrosis after fusion or arthrodesis: Secondary | ICD-10-CM | POA: Diagnosis not present

## 2020-08-14 DIAGNOSIS — I9581 Postprocedural hypotension: Secondary | ICD-10-CM | POA: Diagnosis not present

## 2020-08-21 DIAGNOSIS — M19072 Primary osteoarthritis, left ankle and foot: Secondary | ICD-10-CM | POA: Diagnosis not present

## 2020-08-29 DIAGNOSIS — Z23 Encounter for immunization: Secondary | ICD-10-CM | POA: Diagnosis not present

## 2020-08-30 DIAGNOSIS — M19071 Primary osteoarthritis, right ankle and foot: Secondary | ICD-10-CM | POA: Diagnosis not present

## 2020-09-02 DIAGNOSIS — H0015 Chalazion left lower eyelid: Secondary | ICD-10-CM | POA: Diagnosis not present

## 2020-09-20 ENCOUNTER — Ambulatory Visit: Payer: Medicare Other | Admitting: Psychology

## 2020-09-26 DIAGNOSIS — T8484XD Pain due to internal orthopedic prosthetic devices, implants and grafts, subsequent encounter: Secondary | ICD-10-CM | POA: Diagnosis not present

## 2020-09-26 DIAGNOSIS — M19071 Primary osteoarthritis, right ankle and foot: Secondary | ICD-10-CM | POA: Diagnosis not present

## 2020-10-15 ENCOUNTER — Ambulatory Visit (INDEPENDENT_AMBULATORY_CARE_PROVIDER_SITE_OTHER): Payer: Medicare Other | Admitting: Internal Medicine

## 2020-10-15 ENCOUNTER — Other Ambulatory Visit: Payer: Self-pay

## 2020-10-15 ENCOUNTER — Encounter: Payer: Self-pay | Admitting: Internal Medicine

## 2020-10-15 VITALS — BP 130/76 | HR 64 | Temp 98.3°F | Ht 68.0 in | Wt 168.0 lb

## 2020-10-15 DIAGNOSIS — F339 Major depressive disorder, recurrent, unspecified: Secondary | ICD-10-CM | POA: Diagnosis not present

## 2020-10-15 DIAGNOSIS — M79671 Pain in right foot: Secondary | ICD-10-CM | POA: Diagnosis not present

## 2020-10-15 DIAGNOSIS — I10 Essential (primary) hypertension: Secondary | ICD-10-CM | POA: Diagnosis not present

## 2020-10-15 DIAGNOSIS — E538 Deficiency of other specified B group vitamins: Secondary | ICD-10-CM

## 2020-10-15 DIAGNOSIS — D649 Anemia, unspecified: Secondary | ICD-10-CM

## 2020-10-15 DIAGNOSIS — N320 Bladder-neck obstruction: Secondary | ICD-10-CM | POA: Diagnosis not present

## 2020-10-15 DIAGNOSIS — D638 Anemia in other chronic diseases classified elsewhere: Secondary | ICD-10-CM | POA: Insufficient documentation

## 2020-10-15 DIAGNOSIS — E785 Hyperlipidemia, unspecified: Secondary | ICD-10-CM | POA: Diagnosis not present

## 2020-10-15 DIAGNOSIS — M1000 Idiopathic gout, unspecified site: Secondary | ICD-10-CM

## 2020-10-15 DIAGNOSIS — F4323 Adjustment disorder with mixed anxiety and depressed mood: Secondary | ICD-10-CM | POA: Diagnosis not present

## 2020-10-15 DIAGNOSIS — E1159 Type 2 diabetes mellitus with other circulatory complications: Secondary | ICD-10-CM | POA: Diagnosis not present

## 2020-10-15 LAB — MICROALBUMIN / CREATININE URINE RATIO
Creatinine,U: 122.9 mg/dL
Microalb Creat Ratio: 1 mg/g (ref 0.0–30.0)
Microalb, Ur: 1.3 mg/dL (ref 0.0–1.9)

## 2020-10-15 LAB — HEMOGLOBIN A1C: Hgb A1c MFr Bld: 6.4 % (ref 4.6–6.5)

## 2020-10-15 LAB — COMPREHENSIVE METABOLIC PANEL
ALT: 12 U/L (ref 0–53)
AST: 12 U/L (ref 0–37)
Albumin: 4.2 g/dL (ref 3.5–5.2)
Alkaline Phosphatase: 111 U/L (ref 39–117)
BUN: 29 mg/dL — ABNORMAL HIGH (ref 6–23)
CO2: 28 mEq/L (ref 19–32)
Calcium: 9.7 mg/dL (ref 8.4–10.5)
Chloride: 103 mEq/L (ref 96–112)
Creatinine, Ser: 0.99 mg/dL (ref 0.40–1.50)
GFR: 77.46 mL/min (ref >60.00)
Glucose, Bld: 115 mg/dL — ABNORMAL HIGH (ref 70–99)
Potassium: 5 mEq/L (ref 3.5–5.1)
Sodium: 139 mEq/L (ref 135–145)
Total Bilirubin: 0.4 mg/dL (ref 0.2–1.2)
Total Protein: 6.7 g/dL (ref 6.0–8.3)

## 2020-10-15 LAB — URINALYSIS
Bilirubin Urine: NEGATIVE
Hgb urine dipstick: NEGATIVE
Ketones, ur: NEGATIVE
Leukocytes,Ua: NEGATIVE
Nitrite: NEGATIVE
Specific Gravity, Urine: 1.025 (ref 1.000–1.030)
Total Protein, Urine: NEGATIVE
Urine Glucose: NEGATIVE
Urobilinogen, UA: 0.2 (ref 0.0–1.0)
pH: 5.5 (ref 5.0–8.0)

## 2020-10-15 LAB — LIPID PANEL
Cholesterol: 139 mg/dL (ref 0–200)
HDL: 57.4 mg/dL (ref >39.00)
LDL Cholesterol: 72 mg/dL (ref 0–99)
NonHDL: 81.32
Total CHOL/HDL Ratio: 2
Triglycerides: 47 mg/dL (ref 0.0–149.0)
VLDL: 9.4 mg/dL (ref 0.0–40.0)

## 2020-10-15 LAB — CBC WITH DIFFERENTIAL/PLATELET
Basophils Absolute: 0 10*3/uL (ref 0.0–0.1)
Basophils Relative: 0.3 % (ref 0.0–3.0)
Eosinophils Absolute: 0.1 10*3/uL (ref 0.0–0.7)
Eosinophils Relative: 1.2 % (ref 0.0–5.0)
HCT: 32.6 % — ABNORMAL LOW (ref 39.0–52.0)
Hemoglobin: 11.2 g/dL — ABNORMAL LOW (ref 13.0–17.0)
Lymphocytes Relative: 17.6 % (ref 12.0–46.0)
Lymphs Abs: 1.1 10*3/uL (ref 0.7–4.0)
MCHC: 34.3 g/dL (ref 30.0–36.0)
MCV: 92.5 fl (ref 78.0–100.0)
Monocytes Absolute: 0.6 10*3/uL (ref 0.1–1.0)
Monocytes Relative: 10.1 % (ref 3.0–12.0)
Neutro Abs: 4.3 10*3/uL (ref 1.4–7.7)
Neutrophils Relative %: 70.8 % (ref 43.0–77.0)
Platelets: 302 10*3/uL (ref 150.0–400.0)
RBC: 3.52 Mil/uL — ABNORMAL LOW (ref 4.22–5.81)
RDW: 13.9 % (ref 11.5–15.5)
WBC: 6.1 10*3/uL (ref 4.0–10.5)

## 2020-10-15 LAB — T4, FREE: Free T4: 0.98 ng/dL (ref 0.60–1.60)

## 2020-10-15 LAB — TSH: TSH: 2.02 u[IU]/mL (ref 0.35–4.50)

## 2020-10-15 LAB — PSA: PSA: 4.85 ng/mL — ABNORMAL HIGH (ref 0.10–4.00)

## 2020-10-15 LAB — VITAMIN B12: Vitamin B-12: 279 pg/mL (ref 211–911)

## 2020-10-15 NOTE — Assessment & Plan Note (Signed)
On Crestor 

## 2020-10-15 NOTE — Assessment & Plan Note (Signed)
Cont w/ Fluoxetine 

## 2020-10-15 NOTE — Assessment & Plan Note (Signed)
Worse due to his recent surgery

## 2020-10-15 NOTE — Assessment & Plan Note (Signed)
Chronic - seeing a hematologist in 2022, bone marrow bx was adviced

## 2020-10-15 NOTE — Assessment & Plan Note (Signed)
R foot post - op revision/fusion  - needs X ray. Dr Megan Salon ([facility] tel [phone].

## 2020-10-15 NOTE — Progress Notes (Addendum)
Subjective:  Patient ID: Jared Alvarez, male    DOB: Jun 12, 1950  Age: 70 y.o. MRN: [id number]  CC: Follow-up (Pt states want to discuss surgery that was done on his (R) foot) and Wrist Injury (Left, ? If its sprain)   HPI Jared Alvarez presents for R foot post - op revision/fusion  - needs X ray. Dr Megan Salon ([facility] tel [phone]. F/u HTN, DM, depression - not seen in 2 years    Outpatient Medications Prior to Visit  Medication Sig Dispense Refill   acetaminophen (TYLENOL) 500 MG tablet Take 1 tablet (500 mg total) by mouth every 6 (six) hours as needed. 30 tablet 2   aspirin 81 MG tablet Take 1 tablet (81 mg total) by mouth daily. 30 tablet 2   Cholecalciferol (VITAMIN D3) 2000 units capsule Take 1 capsule (2,000 Units total) by mouth daily. 100 capsule 2   colchicine 0.6 MG tablet Take 1 tablet (0.6 mg total) by mouth as needed (2 a day prn gout). If patient has flare up with gout 10 tablet 5   Cyanocobalamin (VITAMIN B-12) 500 MCG SUBL Place 1 tablet (500 mcg total) under the tongue daily. 150 tablet 2   famotidine (PEPCID) 40 MG tablet TAKE ONE TABLET BY MOUTH EVERY NIGHT AT BEDTIME 90 tablet 1   FLUoxetine (PROZAC) 40 MG capsule TAKE ONE CAPSULE BY MOUTH DAILY 90 capsule 1   Homeopathic Products (ARNICARE ARNICA) OINT Apply 1 application topically 2 (two) times daily as needed (arthritis).     LORazepam (ATIVAN) 0.5 MG tablet Take by mouth. Take 1/2 tab by mouth at bedtime     losartan (COZAAR) 100 MG tablet TAKE ONE TABLET BY MOUTH DAILY 90 tablet 1   metFORMIN (GLUCOPHAGE) 1000 MG tablet TAKE ONE TABLET BY MOUTH TWICE A DAY WITH A MEAL 180 tablet 1   nitroGLYCERIN (NITROSTAT) 0.4 MG SL tablet Place 1 tablet (0.4 mg total) under the tongue every 5 (five) minutes as needed for chest pain. 25 tablet 3   pantoprazole (PROTONIX) 40 MG tablet TAKE ONE TABLET BY MOUTH TWICE A DAY 180 tablet 1   rosuvastatin (CRESTOR) 20 MG tablet Take 20 mg by mouth 3  (three) times a week. Mon, Wed, and Fri     LORazepam (ATIVAN) 1 MG tablet Take 1 tablet (1 mg total) by mouth 2 (two) times daily as needed for anxiety. 60 tablet 3   amLODipine (NORVASC) 10 MG tablet Take 1 tablet (10 mg total) by mouth daily. 90 tablet 3   tadalafil (CIALIS) 20 MG tablet Take 1 tablet (20 mg total) by mouth every other day as needed for erectile dysfunction. 10 tablet 5   glucose blood (ONETOUCH VERIO) test strip Use as instructed 50 each 2   ONETOUCH DELICA LANCETS FINE MISC 1 Device by Does not apply route daily as needed. 100 each 3   rosuvastatin (CRESTOR) 40 MG tablet Take 40 mg by mouth daily.      traMADol (ULTRAM) 50 MG tablet Take 1 tablet (50 mg total) by mouth every 6 (six) hours as needed for severe pain. 120 tablet 2   No facility-administered medications prior to visit.    ROS: Review of Systems  Constitutional:  Negative for appetite change, fatigue and unexpected weight change.  HENT:  Negative for congestion, nosebleeds, sneezing, sore throat and trouble swallowing.   Eyes:  Negative for itching and visual disturbance.  Respiratory:  Negative for cough.   Cardiovascular:  Negative for chest  pain, palpitations and leg swelling.  Gastrointestinal:  Negative for abdominal distention, blood in stool, diarrhea and nausea.  Genitourinary:  Negative for frequency and hematuria.  Musculoskeletal:  Positive for arthralgias and gait problem. Negative for back pain, joint swelling and neck pain.  Skin:  Negative for rash.  Neurological:  Negative for dizziness, tremors, speech difficulty and weakness.  Psychiatric/Behavioral:  Positive for dysphoric mood. Negative for agitation, sleep disturbance and suicidal ideas. The patient is nervous/anxious.    Objective:  BP 130/76 (BP Location: Left Arm)   Pulse 64   Temp 98.3 F (36.8 C) (Oral)   Ht 5\' 8"  (1.727 m)   Wt 168 lb (76.2 kg)   SpO2 96%   BMI 25.54 kg/m   BP Readings from Last 3 Encounters:  10/15/20  130/76  09/28/18 (!) 141/80  05/17/18 126/82    Wt Readings from Last 3 Encounters:  10/15/20 168 lb (76.2 kg)  09/28/18 175 lb (79.4 kg)  05/17/18 178 lb (80.7 kg)    Physical Exam Constitutional:      General: He is not in acute distress.    Appearance: He is well-developed.     Comments: NAD  Eyes:     Conjunctiva/sclera: Conjunctivae normal.     Pupils: Pupils are equal, round, and reactive to light.  Neck:     Thyroid: No thyromegaly.     Vascular: No JVD.  Cardiovascular:     Rate and Rhythm: Normal rate and regular rhythm.     Heart sounds: Normal heart sounds. No murmur heard.   No friction rub. No gallop.  Pulmonary:     Effort: Pulmonary effort is normal. No respiratory distress.     Breath sounds: Normal breath sounds. No wheezing or rales.  Chest:     Chest wall: No tenderness.  Abdominal:     General: Bowel sounds are normal. There is no distension.     Palpations: Abdomen is soft. There is no mass.     Tenderness: There is no abdominal tenderness. There is no guarding or rebound.  Musculoskeletal:        General: Tenderness present. Normal range of motion.     Cervical back: Normal range of motion.  Lymphadenopathy:     Cervical: No cervical adenopathy.  Skin:    General: Skin is warm and dry.     Findings: No rash.  Neurological:     Mental Status: He is alert and oriented to person, place, and time.     Cranial Nerves: No cranial nerve deficit.     Motor: No abnormal muscle tone.     Coordination: Coordination abnormal.     Gait: Gait abnormal.     Deep Tendon Reflexes: Reflexes are normal and symmetric.  Psychiatric:        Behavior: Behavior normal.        Thought Content: Thought content normal.        Judgment: Judgment normal.   R foot is in a boot, using a crutch    A total time of 47 minutes was spent preparing to see the patient, reviewing tests, x-rays, operative reports and outside records.  Also, obtaining history and performing  comprehensive physical exam.  Additionally, counseling the patient regarding the above listed issues.   Finally, documenting clinical information in the health records, coordination of care, educating the patient. It is a complex case.   Lab Results  Component Value Date   WBC 4.9 05/17/2018   HGB 12.6 (L) 05/17/2018  HCT 36.8 (L) 05/17/2018   PLT 262.0 05/17/2018   GLUCOSE 110 (H) 05/17/2018   CHOL 150 05/17/2018   TRIG 45.0 05/17/2018   HDL 63.40 05/17/2018   LDLDIRECT 149.6 12/15/2011   LDLCALC 77 05/17/2018   ALT 12 05/17/2018   AST 13 05/17/2018   NA 140 05/17/2018   K 4.1 05/17/2018   CL 104 05/17/2018   CREATININE 0.87 05/17/2018   BUN 17 05/17/2018   CO2 25 05/17/2018   TSH 1.93 05/17/2018   PSA 0.64 09/22/2016   INR 0.98 06/03/2010   HGBA1C 6.7 (H) 05/17/2018   MICROALBUR <0.7 04/29/2017    DG Knee Complete 4 Views Left  Result Date: 02/17/2018 CLINICAL DATA:  Acute LEFT knee pain, fell recently while walking the dog EXAM: LEFT KNEE - COMPLETE 4+ VIEW COMPARISON:  None FINDINGS: Osseous mineralization normal. Mild medial compartment joint space narrowing. Scattered chondrocalcinosis question CPPD. No acute fracture, dislocation, or bone destruction. Question minimal calcific debris at suprapatellar recess. No knee joint effusion. IMPRESSION: Minimal degenerative changes and suspected CPPD of the LEFT knee with question minimal calcific debris at the suprapatellar recess. No acute abnormalities. Electronically Signed   By: Lavonia Dana M.D.   On: 02/17/2018 16:35    Assessment & Plan:     Follow-up: No follow-ups on file.  Walker Kehr, MD

## 2020-10-15 NOTE — Assessment & Plan Note (Signed)
No relapse Check uric acid

## 2020-10-15 NOTE — Assessment & Plan Note (Signed)
On B12 

## 2020-10-15 NOTE — Assessment & Plan Note (Signed)
Cont w/Amlodipine, Losartan

## 2020-10-15 NOTE — Assessment & Plan Note (Addendum)
On Metformin. A1c was  6.0% Will get labs

## 2020-10-28 ENCOUNTER — Ambulatory Visit (INDEPENDENT_AMBULATORY_CARE_PROVIDER_SITE_OTHER): Payer: Medicare Other | Admitting: Family Medicine

## 2020-10-28 ENCOUNTER — Ambulatory Visit: Payer: Self-pay

## 2020-10-28 ENCOUNTER — Other Ambulatory Visit: Payer: Self-pay

## 2020-10-28 ENCOUNTER — Ambulatory Visit (INDEPENDENT_AMBULATORY_CARE_PROVIDER_SITE_OTHER): Payer: Medicare Other

## 2020-10-28 VITALS — BP 144/84 | HR 65 | Ht 68.0 in | Wt 174.6 lb

## 2020-10-28 DIAGNOSIS — M10032 Idiopathic gout, left wrist: Secondary | ICD-10-CM

## 2020-10-28 DIAGNOSIS — M7989 Other specified soft tissue disorders: Secondary | ICD-10-CM | POA: Diagnosis not present

## 2020-10-28 DIAGNOSIS — M25532 Pain in left wrist: Secondary | ICD-10-CM

## 2020-10-28 LAB — URIC ACID: Uric Acid, Serum: 5.2 mg/dL (ref 4.0–7.8)

## 2020-10-28 MED ORDER — COLCHICINE 0.6 MG PO TABS: 0.6000 mg | ORAL_TABLET | Freq: Two times a day (BID) | ORAL | 5 refills | Status: AC

## 2020-10-28 NOTE — Progress Notes (Signed)
Jared Alvarez, LAT, ATC acting as a scribe for Jared Leader, MD.  Subjective:    CC: Left wrist pain  HPI: Pt is a 70 y/o male c/o L wrist pain x 2 weeks w/ worsening pain since Friday, 6/24. Pt has been using an assistive devise to ambulate due to his foot surgery, but discontinued about 2 weeks ago. Pt notes he has a hx of gout, but hasn't had a flare in years. Pt locates pain to across wrist and into hand/MC. Pt c/o wrist feeling warm to the touch, swollen, and very painful.  He is also in the process of dealing with a significantly troublesome nonunion right ankle.  He had what sounds like a fusion attempt of his ankle last year which was unsuccessful and a repeat surgery about 10 weeks ago which is in the process of healing and is apparently doing pretty well.  L wrist swelling: yes Numbness/tingling: no Grip strength: decreased Aggravates: gripping and holding things Treatments tried: IBU, colchicine (2 doses), wrist brace   Pertinent review of Systems: No fevers or chills  Relevant historical information: Gout history   Objective:    Vitals:   10/28/20 1343  BP: (!) 144/84  Pulse: 65  SpO2: 97%   General: Well Developed, well nourished, and in no acute distress.   MSK: Left wrist moderate effusion Decreased motion. Tender palpation dorsal wrist. Pulses cap refill and sensation are intact distally.  Lab and Radiology Results Results for orders placed or performed in visit on 10/28/20 (from the past 72 hour(s))  Uric acid     Status: None   Collection Time: 10/28/20  2:22 PM  Result Value Ref Range   Uric Acid, Serum 5.2 4.0 - 7.8 mg/dL   X-ray images left wrist obtained today personally and independently interpreted Old appearing fracture/subacute fracture at distal ulna. Chondrocalcinosis/callus formation at distal ulna.  Degenerative changes present at distal radius and first Dent.  Chondrocalcinosis present radial side of wrist. Await formal radiology  review  Diagnostic Limited MSK Ultrasound of: Left wrist Moderate joint effusion present dorsal wrist Intact normal-appearing dorsal tendons. Significant edema present within subcutaneous tissue dorsal wrist. Impression: Wrist effusion and subcutaneous edema.   Patient was treated with a volar slab custom made fiberglass wrist splint.  Impression and Recommendations:    Assessment and Plan: 70 y.o. male with left wrist pain.  Etiology is somewhat unclear.  Most likely explanation upon presentation was gout however patient was not responding immediately to colchicine..  The x-ray was obtained following the visit and was not available during the decision-making portion of the visit.  During the visit discussed different treatment options.  The obvious treatment option was a steroid injection into the wrist however given that he is trying to heal a persistently challenging nonunion in the right foot and ankle a steroid injection could potentially delay healing.  We decided to avoid steroid injection for now.  Plan to renew his colchicine prescription which is quite old and may not be as effective.  Additionally recheck uric acid level and treat with a volar slab splint.  At the time of this note more information is available.  It appears that he has significant degenerative changes in the wrist and possibly a subacute fracture.  The splint should be quite helpful.  Await radiology overread.  Recheck in about 1 week as scheduled.  Continue colchicine for now.  PDMP not reviewed this encounter. Orders Placed This Encounter  Procedures   [place] LIMITED  JOINT SPACE STRUCTURES UP LEFT(NO LINKED CHARGES)    Standing Status:   Future    Number of Occurrences:   1    Standing Expiration Date:   04/29/2021    Order Specific Question:   Reason for Exam (SYMPTOM  OR DIAGNOSIS REQUIRED)    Answer:   left wrist pain    Order Specific Question:   Preferred imaging location?    Answer:   [facility]   DG Wrist Complete Left    Standing Status:   Future    Number of Occurrences:   1    Standing Expiration Date:   10/28/2021    Order Specific Question:   Reason for Exam (SYMPTOM  OR DIAGNOSIS REQUIRED)    Answer:   eval left wrist pain    Order Specific Question:   Preferred imaging location?    Answer:   [facility]   Uric acid    Standing Status:   Future    Number of Occurrences:   1    Standing Expiration Date:   10/28/2021   Meds ordered this encounter  Medications   colchicine 0.6 MG tablet    Sig: Take 1 tablet (0.6 mg total) by mouth 2 (two) times daily. If patient has flare up with gout    Dispense:  30 tablet    Refill:  5    Discussed warning signs or symptoms. Please see discharge instructions. Patient expresses understanding.   The above documentation has been reviewed and is accurate and complete Jared Alvarez, M.D.

## 2020-10-28 NOTE — Patient Instructions (Addendum)
Thank you for coming in today.   Take colchicine 2x daily.   Schedule next week.   Please use Voltaren gel (Generic Diclofenac Gel) up to 4x daily for pain as needed.  This is available over-the-counter as both the name brand Voltaren gel and the generic diclofenac gel.   If this is not working I will do an injection.   Get xray today.   Use the splint as needed.  It it helps to wear it use it. Ok to take it off if you want to.

## 2020-10-29 ENCOUNTER — Ambulatory Visit: Payer: Medicare Other | Admitting: Family Medicine

## 2020-10-29 ENCOUNTER — Telehealth: Payer: Self-pay | Admitting: Family Medicine

## 2020-10-29 NOTE — Telephone Encounter (Signed)
Pt is a little confused by the MyChart messages he received today. Labs normal, was advised not to take gout but message said to take. Also expressed concern about xray results. Would like to talk to someone for clarity.

## 2020-10-29 NOTE — Progress Notes (Signed)
Uric acid level is 5.2.  This is basically normal.  Gout is less likely. Your x-ray is not normal.  Radiology is still looking at it.  Continue the splint and keep taking the colchicine which may help.  More information forthcoming.

## 2020-10-29 NOTE — Telephone Encounter (Signed)
I called Jared Alvarez back. Recommend continue colchicine despite uric acid being normal.  Suspect possible pseudogout based on x-ray appearance.  Await radiology report.

## 2020-10-29 NOTE — Progress Notes (Signed)
X-ray left wrist shows soft tissue swelling and chondrocalcinosis possibly due to pseudogout.  No acute fractures are visible.  Prominent arthritis is present in the wrist. Like we discussed this morning continue colchicine and use the wrist splint for a while.  Recheck as scheduled.

## 2020-10-31 NOTE — Progress Notes (Signed)
   I, [name], LAT, ATC acting as a scribe for Lynne Leader, MD.  Jared Alvarez is a 70 y.o. male who presents to [place] [facility] today for f/u of L wrist pain.  He was last seen by Dr. Georgina Snell on 10/28/20 and was advised to take colchicine bid and to use Voltaren gel.  He was also provided a volar slab splint.  Since his last visit, pt reports L wrist is a lot better, but still has pain along the ulnar aspect of the forearm and 5th MC. Pt reports experiencing diarrhea due to the colchicine and decreased his dosage to 2, 1/2 tablets a day.  Diagnostic testing: L wrist XR- 10/28/20  Pertinent review of systems: No fevers or chills  Relevant historical information: Please [place].  History of gout.  Healing right foot fusion   Exam:  BP 102/76 (BP Location: Left Arm, Patient Position: Sitting, Cuff Size: Normal)   Pulse (!) 57   Ht 5\' 8"  (1.727 m)   Wt 173 lb 6.4 oz (78.7 kg)   SpO2 96%   BMI 26.37 kg/m  General: Well Developed, well nourished, and in no acute distress.   MSK: Left wrist mildly swollen.  Tender palpation dorsal ulnar wrist. Slight decreased range of motion.     Lab and Radiology Results  EXAM: LEFT WRIST - COMPLETE 3+ VIEW   COMPARISON:  No prior.   FINDINGS: Diffuse soft tissue swelling. Diffuse prominent degenerative change. Chondrocalcinosis. No erosive changes noted. No evidence of fracture or dislocation. No radiopaque foreign body.   IMPRESSION: Diffuse soft tissue swelling. Diffuse prominent degenerative change. Chondrocalcinosis noted. This could be secondary to degenerative change. Crystal induced arthropathy cannot be excluded. No erosive changes noted. No evidence of fracture or dislocation.     Electronically Signed   By: Marcello Moores  Register   On: 10/29/2020 10:13  I, Lynne Leader, personally (independently) visualized and performed the interpretation of the images attached in this note.  Lab Results  Component  Value Date   LABURIC 5.2 10/28/2020       Assessment and Plan: 70 y.o. male with left wrist pain thought to be due to exacerbation of pseudogout and DJD.  Patient has done well over the last week or so with immobilization and colchicine.  The next obvious step is a steroid injection into the wrist.  However he is in the process of trying to heal a fusion in his right foot and is almost 12 weeks out from the surgery.  He has a follow-up x-ray and visit with his orthopedic surgeon in about a week.  Would like to wait until at least then before proceeding with steroid injection if needed.  He will have x-ray and consultation and let me know what the plan is with his foot.  For now use the wrist brace as needed and take colchicine as needed.  Recheck as needed.    Discussed warning signs or symptoms. Please see discharge instructions. Patient expresses understanding.   The above documentation has been reviewed and is accurate and complete Lynne Leader, M.D.

## 2020-11-06 ENCOUNTER — Other Ambulatory Visit: Payer: Self-pay

## 2020-11-06 ENCOUNTER — Ambulatory Visit (INDEPENDENT_AMBULATORY_CARE_PROVIDER_SITE_OTHER): Payer: Medicare Other | Admitting: Family Medicine

## 2020-11-06 VITALS — BP 102/76 | HR 57 | Ht 68.0 in | Wt 173.4 lb

## 2020-11-06 DIAGNOSIS — M25532 Pain in left wrist: Secondary | ICD-10-CM

## 2020-11-06 NOTE — Patient Instructions (Addendum)
Thank you for coming in today.   Let me know what the foot surgeon says about a shot in your wrist.   I can do one at any time.   Use the splint as needed.   Use colchicine as needed.   Recheck when you need to.   Ok to try playing guitar and bass.

## 2020-11-13 NOTE — Progress Notes (Signed)
I, Jared Alvarez, LAT, ATC, am serving as scribe for Dr. Lynne Leader.  Jared Alvarez is a 70 y.o. male who presents to [place] at [facility] today for f/u of L wrist pain.  He was last seen by Dr. Georgina Snell on 11/06/20 and noted improvement in his L wrist/hand pain.  He was advised to con't using his splint and colchicine as needed.  Since his last visit, pt reports that his L wrist remains the same.  He con't to wear his L wrist brace at night.  He has not been playing guitar or base most recently.  His foot surgeon has cleared him to have the wrist injection today.  He is currently about 12 weeks out from a second attempt midfoot fusion.  Diagnostic imaging: L wrist XR- 10/28/20  Pertinent review of systems: No fevers or chills  Relevant historical information: Difficulty with midfoot fusion.   Exam:  BP 120/70 (BP Location: Right Arm, Patient Position: Sitting, Cuff Size: Normal)   Ht 5\' 8"  (1.727 m)   Wt 174 lb 3.2 oz (79 kg)   BMI 26.49 kg/m  General: Well Developed, well nourished, and in no acute distress.   MSK: Left wrist slight swelling ulnar aspect wrist.  Tender palpation.  Decreased range of motion.    Lab and Radiology Results  Procedure: Real-time Ultrasound Guided Injection of left dorsal wrist Device: Philips Affiniti 50G Images permanently stored and available for review in PACS Verbal informed consent obtained.  Discussed risks and benefits of procedure. Warned about infection bleeding damage to structures skin hypopigmentation and fat atrophy among others. Patient expresses understanding and agreement Time-out conducted.   Noted no overlying erythema, induration, or other signs of local infection.   Skin prepped in a sterile fashion.   Local anesthesia: Topical Ethyl chloride.   With sterile technique and under real time ultrasound guidance: 40 mg of Kenalog and 1 milliliter of Marcaine injected into dorsal wrist joint. Fluid seen entering  the joint capsule.   Completed without difficulty   Pain immediately resolved suggesting accurate placement of the medication.   Advised to call if fevers/chills, erythema, induration, drainage, or persistent bleeding.   Images permanently stored and available for review in the ultrasound unit.  Impression: Technically successful ultrasound guided injection.         Assessment and Plan: 70 y.o. male with left wrist pain and swelling thought to be due to exacerbation of DJD and pseudogout.  Improved with immobilization but not fully resolved.  Plan for intra-articular steroid injection favoring the ulnar aspect of the wrist today.  We have been holding off on this because he is still trying to heal from a midfoot fusion attempt.  He has been given the allCLEAR by his foot surgeon to proceed with injection today.  Recheck with me as needed.   PDMP not reviewed this encounter. Orders Placed This Encounter  Procedures   [place] LIMITED JOINT SPACE STRUCTURES UP LEFT(NO LINKED CHARGES)    Order Specific Question:   Reason for Exam (SYMPTOM  OR DIAGNOSIS REQUIRED)    Answer:   L wrist pain    Order Specific Question:   Preferred imaging location?    Answer:   [place]   No orders of the defined types were placed in this encounter.    Discussed warning signs or symptoms. Please see discharge instructions. Patient expresses understanding.   The above documentation has been reviewed and is accurate and complete Ellard Artis  Georgina Snell, M.D.  Total encounter time 20 minutes including face-to-face time with the patient and, reviewing past medical record, and charting on the date of service.   Discussed treatment plan and options.  Also reviewed his foot x-ray and discussed how to get x-ray images to his foot surgeon.  Time billing today excludes time to perform injection.

## 2020-11-14 ENCOUNTER — Ambulatory Visit: Payer: Self-pay

## 2020-11-14 ENCOUNTER — Ambulatory Visit (INDEPENDENT_AMBULATORY_CARE_PROVIDER_SITE_OTHER): Payer: Medicare Other | Admitting: Family Medicine

## 2020-11-14 ENCOUNTER — Other Ambulatory Visit: Payer: Self-pay

## 2020-11-14 ENCOUNTER — Ambulatory Visit (INDEPENDENT_AMBULATORY_CARE_PROVIDER_SITE_OTHER): Payer: Medicare Other

## 2020-11-14 ENCOUNTER — Encounter: Payer: Self-pay | Admitting: Family Medicine

## 2020-11-14 VITALS — BP 120/70 | Ht 68.0 in | Wt 174.2 lb

## 2020-11-14 DIAGNOSIS — M7989 Other specified soft tissue disorders: Secondary | ICD-10-CM | POA: Diagnosis not present

## 2020-11-14 DIAGNOSIS — M79671 Pain in right foot: Secondary | ICD-10-CM

## 2020-11-14 DIAGNOSIS — M25532 Pain in left wrist: Secondary | ICD-10-CM | POA: Diagnosis not present

## 2020-11-14 NOTE — Patient Instructions (Addendum)
Good to see you today.  You had a L wrist injection.  Call or go to the ER if you develop a large red swollen joint with extreme pain or oozing puss.   Follow-up as needed.  You can text those xray pictures to the surgeon at the same time as you request the CD of the images formally.   The report should come back tomorrow or Saturday through [facility].

## 2020-11-17 ENCOUNTER — Encounter: Payer: Self-pay | Admitting: Family Medicine

## 2020-11-18 ENCOUNTER — Telehealth: Payer: Self-pay | Admitting: *Deleted

## 2020-11-18 NOTE — Telephone Encounter (Signed)
Call Report (R) Foot Xray- Report is in epic  Near complete bony fusion across the first metatarsophalangeal Joint, and  Extensive postsurgical changes within the hindfoot. Separation of the fusion plate and medial cortical margin of the talus

## 2020-11-18 NOTE — Telephone Encounter (Signed)
Jared Alvarez, Please fax this report to  Dr Megan Salon ([facility] tel [phone] Thanks, AP

## 2020-11-19 ENCOUNTER — Telehealth: Payer: Self-pay

## 2020-11-19 NOTE — Telephone Encounter (Signed)
Called [facility] spoke w/ scheduler verifed fax # [phone]. Faxed report to # given.Marland KitchenJohny Chess

## 2020-11-19 NOTE — Telephone Encounter (Signed)
Report faxed to Dr. Megan Salon @ [phone].Marland KitchenJohny Chess

## 2020-11-25 ENCOUNTER — Ambulatory Visit: Payer: Medicare Other | Admitting: Psychology

## 2020-11-25 NOTE — Telephone Encounter (Signed)
Office called back, stated they did not receive the fax and requesting it to be re-faxed to [phone].

## 2020-11-25 NOTE — Telephone Encounter (Signed)
Refaxed xray report to [phone]. Rec'd confirmation that is was received.Marland KitchenChryl Heck

## 2020-11-28 ENCOUNTER — Ambulatory Visit: Payer: Medicare Other

## 2020-12-12 DIAGNOSIS — M79671 Pain in right foot: Secondary | ICD-10-CM | POA: Diagnosis not present

## 2020-12-12 DIAGNOSIS — M19071 Primary osteoarthritis, right ankle and foot: Secondary | ICD-10-CM | POA: Diagnosis not present

## 2020-12-12 DIAGNOSIS — Z981 Arthrodesis status: Secondary | ICD-10-CM | POA: Diagnosis not present

## 2020-12-31 ENCOUNTER — Ambulatory Visit (INDEPENDENT_AMBULATORY_CARE_PROVIDER_SITE_OTHER): Payer: Medicare Other | Admitting: Psychology

## 2020-12-31 ENCOUNTER — Telehealth: Payer: Self-pay

## 2020-12-31 DIAGNOSIS — F341 Dysthymic disorder: Secondary | ICD-10-CM | POA: Diagnosis not present

## 2020-12-31 DIAGNOSIS — F411 Generalized anxiety disorder: Secondary | ICD-10-CM

## 2020-12-31 NOTE — Telephone Encounter (Signed)
Please advise as the pt has called and asked to schedule his AWV as he had to cancel his previous one due to a death in his family. Pt states he would like a morning and mid week apptmnt if possible.

## 2021-01-07 ENCOUNTER — Ambulatory Visit (INDEPENDENT_AMBULATORY_CARE_PROVIDER_SITE_OTHER): Payer: Medicare Other | Admitting: Psychology

## 2021-01-07 DIAGNOSIS — F341 Dysthymic disorder: Secondary | ICD-10-CM | POA: Diagnosis not present

## 2021-01-07 DIAGNOSIS — F411 Generalized anxiety disorder: Secondary | ICD-10-CM | POA: Diagnosis not present

## 2021-01-09 DIAGNOSIS — H5213 Myopia, bilateral: Secondary | ICD-10-CM | POA: Diagnosis not present

## 2021-01-09 DIAGNOSIS — E119 Type 2 diabetes mellitus without complications: Secondary | ICD-10-CM | POA: Diagnosis not present

## 2021-01-09 DIAGNOSIS — H524 Presbyopia: Secondary | ICD-10-CM | POA: Diagnosis not present

## 2021-01-09 DIAGNOSIS — H33301 Unspecified retinal break, right eye: Secondary | ICD-10-CM | POA: Diagnosis not present

## 2021-01-09 LAB — HM DIABETES EYE EXAM

## 2021-01-10 ENCOUNTER — Encounter: Payer: Self-pay | Admitting: Internal Medicine

## 2021-01-15 ENCOUNTER — Ambulatory Visit (INDEPENDENT_AMBULATORY_CARE_PROVIDER_SITE_OTHER): Payer: Medicare Other | Admitting: Psychology

## 2021-01-15 ENCOUNTER — Telehealth: Payer: Self-pay | Admitting: Internal Medicine

## 2021-01-15 DIAGNOSIS — F411 Generalized anxiety disorder: Secondary | ICD-10-CM | POA: Diagnosis not present

## 2021-01-15 DIAGNOSIS — F341 Dysthymic disorder: Secondary | ICD-10-CM | POA: Diagnosis not present

## 2021-01-15 MED ORDER — AMLODIPINE BESYLATE 10 MG PO TABS
10.0000 mg | ORAL_TABLET | Freq: Every day | ORAL | 1 refills | Status: DC
Start: 2021-01-15 — End: 2021-01-16

## 2021-01-15 MED ORDER — FAMOTIDINE 40 MG PO TABS: 40.0000 mg | ORAL_TABLET | Freq: Every day | ORAL | 1 refills | Status: DC

## 2021-01-15 NOTE — Telephone Encounter (Signed)
1.Medication Requested: amLODipine (NORVASC) 10 MG tablet (Expired) famotidine (PEPCID) 40 MG tablet LORazepam (ATIVAN) 0.5 MG tablet Vitamin D3 50,000 IU  2. Pharmacy (Name, [place]):  [facility] [place] [address] Phone:  [phone]  Fax:  [phone]      3. On Med List: yes  4. Last Visit with PCP: 06.14.22  5. Next visit date with PCP: n/a   Agent: Please be advised that RX refills may take up to 3 business days. We ask that you follow-up with your pharmacy.

## 2021-01-16 MED ORDER — AMLODIPINE BESYLATE 10 MG PO TABS: 10.0000 mg | ORAL_TABLET | Freq: Every day | ORAL | 3 refills | Status: DC

## 2021-01-16 NOTE — Telephone Encounter (Signed)
Done. Thanks.

## 2021-01-21 ENCOUNTER — Ambulatory Visit: Payer: Medicare Other | Admitting: Psychology

## 2021-01-30 ENCOUNTER — Ambulatory Visit (INDEPENDENT_AMBULATORY_CARE_PROVIDER_SITE_OTHER): Payer: Medicare Other | Admitting: Internal Medicine

## 2021-01-30 ENCOUNTER — Encounter: Payer: Self-pay | Admitting: Internal Medicine

## 2021-01-30 ENCOUNTER — Other Ambulatory Visit: Payer: Self-pay

## 2021-01-30 VITALS — BP 126/70 | HR 50 | Temp 98.0°F | Ht 68.0 in | Wt 175.0 lb

## 2021-01-30 DIAGNOSIS — R972 Elevated prostate specific antigen [PSA]: Secondary | ICD-10-CM | POA: Diagnosis not present

## 2021-01-30 DIAGNOSIS — D649 Anemia, unspecified: Secondary | ICD-10-CM | POA: Diagnosis not present

## 2021-01-30 DIAGNOSIS — Z23 Encounter for immunization: Secondary | ICD-10-CM | POA: Diagnosis not present

## 2021-01-30 DIAGNOSIS — E559 Vitamin D deficiency, unspecified: Secondary | ICD-10-CM | POA: Diagnosis not present

## 2021-01-30 DIAGNOSIS — R059 Cough, unspecified: Secondary | ICD-10-CM | POA: Diagnosis not present

## 2021-01-30 DIAGNOSIS — K219 Gastro-esophageal reflux disease without esophagitis: Secondary | ICD-10-CM

## 2021-01-30 DIAGNOSIS — E538 Deficiency of other specified B group vitamins: Secondary | ICD-10-CM | POA: Diagnosis not present

## 2021-01-30 LAB — CBC WITH DIFFERENTIAL/PLATELET
Basophils Absolute: 0 10*3/uL (ref 0.0–0.1)
Basophils Relative: 0.3 % (ref 0.0–3.0)
Eosinophils Absolute: 0.1 10*3/uL (ref 0.0–0.7)
Eosinophils Relative: 1.1 % (ref 0.0–5.0)
HCT: 32.7 % — ABNORMAL LOW (ref 39.0–52.0)
Hemoglobin: 11 g/dL — ABNORMAL LOW (ref 13.0–17.0)
Lymphocytes Relative: 17.8 % (ref 12.0–46.0)
Lymphs Abs: 1 10*3/uL (ref 0.7–4.0)
MCHC: 33.6 g/dL (ref 30.0–36.0)
MCV: 94.3 fl (ref 78.0–100.0)
Monocytes Absolute: 0.5 10*3/uL (ref 0.1–1.0)
Monocytes Relative: 10 % (ref 3.0–12.0)
Neutro Abs: 3.8 10*3/uL (ref 1.4–7.7)
Neutrophils Relative %: 70.8 % (ref 43.0–77.0)
Platelets: 215 10*3/uL (ref 150.0–400.0)
RBC: 3.46 Mil/uL — ABNORMAL LOW (ref 4.22–5.81)
RDW: 14.7 % (ref 11.5–15.5)
WBC: 5.4 10*3/uL (ref 4.0–10.5)

## 2021-01-30 LAB — IBC PANEL
Iron: 43 ug/dL (ref 42–165)
Saturation Ratios: 13.1 % — ABNORMAL LOW (ref 20.0–50.0)
TIBC: 329 ug/dL (ref 250.0–450.0)
Transferrin: 235 mg/dL (ref 212.0–360.0)

## 2021-01-30 LAB — VITAMIN D 25 HYDROXY (VIT D DEFICIENCY, FRACTURES): VITD: 46.16 ng/mL (ref 30.00–100.00)

## 2021-01-30 LAB — PSA: PSA: 3.87 ng/mL (ref 0.10–4.00)

## 2021-01-30 LAB — FERRITIN: Ferritin: 54.1 ng/mL (ref 22.0–322.0)

## 2021-01-30 MED ORDER — TRIAMCINOLONE ACETONIDE 55 MCG/ACT NA AERO: 2.0000 | INHALATION_SPRAY | Freq: Every day | NASAL | 12 refills | Status: DC

## 2021-01-30 MED ORDER — TADALAFIL 20 MG PO TABS: 20.0000 mg | ORAL_TABLET | ORAL | 5 refills | Status: DC | PRN

## 2021-01-30 MED ORDER — VITAMIN D (ERGOCALCIFEROL) 1.25 MG (50000 UNIT) PO CAPS: 50000.0000 [IU] | ORAL_CAPSULE | ORAL | 0 refills | Status: DC

## 2021-01-30 MED ORDER — CETIRIZINE HCL 10 MG PO TABS: 10.0000 mg | ORAL_TABLET | Freq: Every day | ORAL | 11 refills | Status: DC

## 2021-01-30 NOTE — Assessment & Plan Note (Signed)
[place] for trial off the PPI if zyrtec/nasacort help the cough

## 2021-01-30 NOTE — Progress Notes (Signed)
Patient ID: Jared Alvarez, male   DOB: Jun 05, 1950, 70 y.o.   MRN: [id number]        Chief Complaint: follow up prod cough, ED, heel pain, elevated PSA and anemia       HPI:  Jared Alvarez is a 70 y.o. male patient of Dr Alain Marion who is not here today, with c/o persistent issue with nasal running with eating associated with cough and choking as well, has seen ENT and tried PPI bid without diagnosis or improvement; not better with claritin.  Denies worsening reflux, abd pain, dysphagia, n/v, bowel change or blood, wants to know if he can come off the PPI and pepcid.  Did well with high dose Vit D before for heel pain per sports medicine and asks for restart.  Has ongoing Ed symptoms, asks for print copy tadalfil to shop around.  Denies urinary symptoms such as dysuria, frequency, urgency, flank pain, hematuria or n/v, fever, chills, but did have mild elevated PSA and asks for f/u.  No overt bleeding, but also had mild anemia with last labs, b12 normal         Wt Readings from Last 3 Encounters:  01/30/21 175 lb (79.4 kg)  11/14/20 174 lb 3.2 oz (79 kg)  11/06/20 173 lb 6.4 oz (78.7 kg)   BP Readings from Last 3 Encounters:  01/30/21 126/70  11/14/20 120/70  11/06/20 102/76         Past Medical History:  Diagnosis Date   Allergy    Anxiety    no per pt   Coronary artery disease    Depression    no per pt   DM type 2 (diabetes mellitus, type 2) (HCC)    GERD (gastroesophageal reflux disease)    Gout    Hearing loss    Hyperlipidemia    Hypertension    Hypogonadism male    Inguinal hernia    right   LBP (low back pain)    Leg cramps    Osteoarthritis    Skin neoplasm    Tinnitus    Vertigo    Vitamin B12 deficiency    Past Surgical History:  Procedure Laterality Date   heart stent     in [place]      reports that he has never smoked. He has never used smokeless tobacco. He reports current alcohol use of about 7.0 standard drinks per week. He  reports that he does not use drugs. family history includes Arthritis in his sister; Coronary artery disease in an other family member; Heart disease in his brother and father; Hypertension in an other family member; Lupus in his sister. Allergies  Allergen Reactions   Atorvastatin     REACTION: pains   Bupropion Hcl     REACTION: side effects   Duloxetine     REACTION: dizzy   Fluticasone     Other reaction(s): other/intolerance   Nexium [Esomeprazole Magnesium]     Rash on legs   Simvastatin     REACTION: pain   Venlafaxine     REACTION: side effects   Viibryd [Vilazodone Hcl]     nightmares   Vilazodone     Other reaction(s): psychological reaction Nightmares    Ketorolac Palpitations   Tamsulosin Palpitations   Current Outpatient Medications on File Prior to Visit  Medication Sig Dispense Refill   acetaminophen (TYLENOL) 500 MG tablet Take 1 tablet (500 mg total) by mouth every 6 (six) hours as  needed. 30 tablet 2   amLODipine (NORVASC) 10 MG tablet Take 1 tablet (10 mg total) by mouth daily. 90 tablet 3   aspirin 81 MG tablet Take 1 tablet (81 mg total) by mouth daily. 30 tablet 2   Cholecalciferol (VITAMIN D3) 2000 units capsule Take 1 capsule (2,000 Units total) by mouth daily. 100 capsule 2   colchicine 0.6 MG tablet Take 1 tablet (0.6 mg total) by mouth 2 (two) times daily. If patient has flare up with gout 30 tablet 5   Cyanocobalamin (VITAMIN B-12) 500 MCG SUBL Place 1 tablet (500 mcg total) under the tongue daily. 150 tablet 2   famotidine (PEPCID) 40 MG tablet Take 1 tablet (40 mg total) by mouth at bedtime. 90 tablet 1   FLUoxetine (PROZAC) 40 MG capsule TAKE ONE CAPSULE BY MOUTH DAILY 90 capsule 1   Homeopathic Products (ARNICARE ARNICA) OINT Apply 1 application topically 2 (two) times daily as needed (arthritis).     LORazepam (ATIVAN) 0.5 MG tablet Take by mouth. Take 1/2 tab by mouth at bedtime     losartan (COZAAR) 100 MG tablet TAKE ONE TABLET BY MOUTH DAILY  90 tablet 1   metFORMIN (GLUCOPHAGE) 1000 MG tablet TAKE ONE TABLET BY MOUTH TWICE A DAY WITH A MEAL 180 tablet 1   nitroGLYCERIN (NITROSTAT) 0.4 MG SL tablet Place 1 tablet (0.4 mg total) under the tongue every 5 (five) minutes as needed for chest pain. 25 tablet 3   pantoprazole (PROTONIX) 40 MG tablet TAKE ONE TABLET BY MOUTH TWICE A DAY 180 tablet 1   rosuvastatin (CRESTOR) 20 MG tablet Take 20 mg by mouth 3 (three) times a week. Mon, Wed, and Fri     No current facility-administered medications on file prior to visit.        ROS:  All others reviewed and negative.  Objective        PE:  BP 126/70 (BP Location: Right Arm, Patient Position: Sitting, Cuff Size: Large)   Pulse (!) 50   Temp 98 F (36.7 C) (Oral)   Ht 5\' 8"  (1.727 m)   Wt 175 lb (79.4 kg)   SpO2 98%   BMI 26.61 kg/m                 Constitutional: Pt appears in NAD               HENT: Head: NCAT.                Right Ear: External ear normal.                 Left Ear: External ear normal.                Eyes: . Pupils are equal, round, and reactive to light. Conjunctivae and EOM are normal               Nose: without d/c or deformity               Neck: Neck supple. Gross normal ROM               Cardiovascular: Normal rate and regular rhythm.                 Pulmonary/Chest: Effort normal and breath sounds without rales or wheezing.                Abd:  Soft, NT, ND, + BS, no organomegaly  Neurological: Pt is alert. At baseline orientation, motor grossly intact               Skin: Skin is warm. No rashes, no other new lesions, LE edema - none               Psychiatric: Pt behavior is normal without agitation   Micro: none  Cardiac tracings I have personally interpreted today:  none  Pertinent Radiological findings (summarize): none   Lab Results  Component Value Date   WBC 5.4 01/30/2021   HGB 11.0 (L) 01/30/2021   HCT 32.7 (L) 01/30/2021   PLT 215.0 01/30/2021   GLUCOSE 115 (H)  10/15/2020   CHOL 139 10/15/2020   TRIG 47.0 10/15/2020   HDL 57.40 10/15/2020   LDLDIRECT 149.6 12/15/2011   LDLCALC 72 10/15/2020   ALT 12 10/15/2020   AST 12 10/15/2020   NA 139 10/15/2020   K 5.0 10/15/2020   CL 103 10/15/2020   CREATININE 0.99 10/15/2020   BUN 29 (H) 10/15/2020   CO2 28 10/15/2020   TSH 2.02 10/15/2020   PSA 3.87 01/30/2021   INR 0.98 06/03/2010   HGBA1C 6.4 10/15/2020   MICROALBUR 1.3 10/15/2020   Assessment/Plan:  Dawood Spitler Gebhardt is a 70 y.o. White or Caucasian [1] male with  has a past medical history of Allergy, Anxiety, Coronary artery disease, Depression, DM type 2 (diabetes mellitus, type 2) ([place]), GERD (gastroesophageal reflux disease), Gout, Hearing loss, Hyperlipidemia, Hypertension, Hypogonadism male, Inguinal hernia, LBP (low back pain), Leg cramps, Osteoarthritis, Skin neoplasm, Tinnitus, Vertigo, and Vitamin B12 deficiency.  B12 deficiency Lab Results  Component Value Date   VITAMINB12 279 10/15/2020   Stable, cont oral replacement - b12 1000 mcg qd  Cough No improved with PPI, claritin or ENT eval, suspect vasomotor rhinitis - for zyrtec, nasacort and f/u persistent symptoms with PCP  Elevated PSA Asympt, for f/u psa r/o worsening suspicion for malignancy  Anemia Mild worsening, for iron lab f/u,  to f/u any worsening symptoms or concerns   GERD (gastroesophageal reflux disease) Ok for trial off the PPI if zyrtec/nasacort help the cough  Followup: Return if symptoms worsen or fail to improve.  Cathlean Cower, MD 01/30/2021 7:03 PM [facility]

## 2021-01-30 NOTE — Patient Instructions (Addendum)
You had the flu shot today  Please take all new medication as prescribed - the zyrtec, and nasacort  Ok to consider stopping the protonix and/or the pepcid if not helping with your symptoms  Please continue all other medications as before, and refills have been done if requested - the tadalfil, Vit D  Please have the pharmacy call with any other refills you may need.  Please continue your efforts at being more active, low cholesterol diet, and weight control.  Please keep your appointments with your specialists as you may have planned  Please go to the LAB at the blood drawing area for the tests to be done  You will be contacted by phone if any changes need to be made immediately.  Otherwise, you will receive a letter about your results with an explanation, but please check with MyChart first.  Please remember to sign up for MyChart if you have not done so, as this will be important to you in the future with finding out test results, communicating by private email, and scheduling acute appointments online when needed.

## 2021-01-30 NOTE — Assessment & Plan Note (Signed)
Asympt, for f/u psa r/o worsening suspicion for malignancy

## 2021-01-30 NOTE — Assessment & Plan Note (Signed)
Mild worsening, for iron lab f/u,  to f/u any worsening symptoms or concerns

## 2021-01-30 NOTE — Assessment & Plan Note (Signed)
Lab Results  Component Value Date   VITAMINB12 279 10/15/2020   Stable, cont oral replacement - b12 1000 mcg qd

## 2021-01-30 NOTE — Assessment & Plan Note (Signed)
No improved with PPI, claritin or ENT eval, suspect vasomotor rhinitis - for zyrtec, nasacort and f/u persistent symptoms with PCP

## 2021-02-05 ENCOUNTER — Ambulatory Visit (INDEPENDENT_AMBULATORY_CARE_PROVIDER_SITE_OTHER): Payer: Medicare Other

## 2021-02-05 ENCOUNTER — Other Ambulatory Visit: Payer: Self-pay

## 2021-02-05 VITALS — BP 136/70 | HR 55 | Temp 98.2°F | Ht 68.0 in | Wt 175.4 lb

## 2021-02-05 DIAGNOSIS — Z0000 Encounter for general adult medical examination without abnormal findings: Secondary | ICD-10-CM

## 2021-02-05 DIAGNOSIS — Z1211 Encounter for screening for malignant neoplasm of colon: Secondary | ICD-10-CM | POA: Diagnosis not present

## 2021-02-05 NOTE — Progress Notes (Addendum)
Subjective:   Jared Alvarez is a 70 y.o. male who presents for Medicare Annual/Subsequent preventive examination.  Review of Systems     Cardiac Risk Factors include: advanced age (>68men, >76 women);diabetes mellitus;dyslipidemia;family history of premature cardiovascular disease;hypertension;male gender     Objective:    Today's Vitals   02/05/21 1043  BP: 136/70  Pulse: (!) 55  Temp: 98.2 F (36.8 C)  SpO2: 98%  Weight: 175 lb 6.4 oz (79.6 kg)  Height: 5\' 8"  (1.727 m)  PainSc: 0-No pain   Body mass index is 26.67 kg/m.  Advanced Directives 02/05/2021 06/22/2017  Does Patient Have a Medical Advance Directive? Yes Yes  Type of Paramedic of [place];Living will [place];Living will  Does patient want to make changes to medical advance directive? No - Patient declined -  Copy of [place] in Chart? No - copy requested No - copy requested    Current Medications (verified) Outpatient Encounter Medications as of 02/05/2021  Medication Sig   acetaminophen (TYLENOL) 500 MG tablet Take 1 tablet (500 mg total) by mouth every 6 (six) hours as needed.   amLODipine (NORVASC) 10 MG tablet Take 1 tablet (10 mg total) by mouth daily.   aspirin 81 MG tablet Take 1 tablet (81 mg total) by mouth daily.   cetirizine (ZYRTEC) 10 MG tablet Take 1 tablet (10 mg total) by mouth daily.   Cholecalciferol (VITAMIN D3) 2000 units capsule Take 1 capsule (2,000 Units total) by mouth daily.   colchicine 0.6 MG tablet Take 1 tablet (0.6 mg total) by mouth 2 (two) times daily. If patient has flare up with gout   Cyanocobalamin (VITAMIN B-12) 500 MCG SUBL Place 1 tablet (500 mcg total) under the tongue daily.   doxazosin (CARDURA) 2 MG tablet Take 2 mg by mouth daily.   famotidine (PEPCID) 40 MG tablet Take 1 tablet (40 mg total) by mouth at bedtime.   FLUoxetine (PROZAC) 40 MG capsule TAKE ONE CAPSULE BY MOUTH DAILY   Homeopathic  Products (ARNICARE ARNICA) OINT Apply 1 application topically 2 (two) times daily as needed (arthritis).   LORazepam (ATIVAN) 0.5 MG tablet Take by mouth. Take 1/2 tab by mouth at bedtime   losartan (COZAAR) 100 MG tablet TAKE ONE TABLET BY MOUTH DAILY   metFORMIN (GLUCOPHAGE) 1000 MG tablet TAKE ONE TABLET BY MOUTH TWICE A DAY WITH A MEAL   nitroGLYCERIN (NITROSTAT) 0.4 MG SL tablet Place 1 tablet (0.4 mg total) under the tongue every 5 (five) minutes as needed for chest pain.   pantoprazole (PROTONIX) 40 MG tablet TAKE ONE TABLET BY MOUTH TWICE A DAY   rosuvastatin (CRESTOR) 20 MG tablet Take 20 mg by mouth 3 (three) times a week. Mon, Wed, and Fri   tadalafil (CIALIS) 20 MG tablet Take 1 tablet (20 mg total) by mouth every other day as needed for erectile dysfunction.   triamcinolone (NASACORT) 55 MCG/ACT AERO nasal inhaler Place 2 sprays into the nose daily.   Vitamin D, Ergocalciferol, (DRISDOL) 1.25 MG (50000 UNIT) CAPS capsule Take 1 capsule (50,000 Units total) by mouth every 7 (seven) days.   No facility-administered encounter medications on file as of 02/05/2021.    Allergies (verified) Atorvastatin, Bupropion hcl, Duloxetine, Fluticasone, Nexium [esomeprazole magnesium], Simvastatin, Venlafaxine, Viibryd [vilazodone hcl], Vilazodone, Ketorolac, and Tamsulosin   History: Past Medical History:  Diagnosis Date   Allergy    Anxiety    no per pt   Coronary artery disease  Depression    no per pt   DM type 2 (diabetes mellitus, type 2) (HCC)    GERD (gastroesophageal reflux disease)    Gout    Hearing loss    Hyperlipidemia    Hypertension    Hypogonadism male    Inguinal hernia    right   LBP (low back pain)    Leg cramps    Osteoarthritis    Skin neoplasm    Tinnitus    Vertigo    Vitamin B12 deficiency    Past Surgical History:  Procedure Laterality Date   heart stent     in [place]     Family History  Problem Relation Age of Onset    Heart disease Father    Arthritis Sister    Heart disease Brother    Coronary artery disease Other    Hypertension Other    Lupus Sister    Colon cancer Neg Hx    Esophageal cancer Neg Hx    Stomach cancer Neg Hx    Rectal cancer Neg Hx    Social History   Socioeconomic History   Marital status: Married    Spouse name: Not on file   Number of children: Not on file   Years of education: Not on file   Highest education level: Not on file  Occupational History   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: [employer]  Tobacco Use   Smoking status: Never   Smokeless tobacco: Never  Vaping Use   Vaping Use: Never used  Substance and Sexual Activity   Alcohol use: Yes    Alcohol/week: 7.0 standard drinks    Types: 7 Glasses of wine per week   Drug use: No   Sexual activity: Yes  Other Topics Concern   Not on file  Social History Narrative   Not on file   Social Determinants of Health   Financial Resource Strain: Low Risk    Difficulty of Paying Living Expenses: Not hard at all  Food Insecurity: No Food Insecurity   Worried About Charity fundraiser in the Last Year: Never true   [place] in the Last Year: Never true  Transportation Needs: No Transportation Needs   Lack of Transportation (Medical): No   Lack of Transportation (Non-Medical): No  Physical Activity: Sufficiently Active   Days of Exercise per Week: 7 days   Minutes of Exercise per Session: 30 min  Stress: No Stress Concern Present   Feeling of Stress : Not at all  Social Connections: Unknown   Frequency of Communication with Friends and Family: More than three times a week   Frequency of Social Gatherings with Friends and Family: More than three times a week   Attends Religious Services: Patient refused   Marine scientist or Organizations: Patient refused   Attends Music therapist: Patient refused   Marital Status: Married    Tobacco  Counseling Counseling given: Not Answered   Clinical Intake:  Pre-visit preparation completed: Yes  Pain : No/denies pain Pain Score: 0-No pain     BMI - recorded: 26.67 Nutritional Status: BMI 25 -29 Overweight Nutritional Risks: None Diabetes: Yes CBG done?: No Did pt. bring in CBG monitor from home?: No  How often do you need to have someone help you when you read instructions, pamphlets, or other written materials from your doctor or pharmacy?: 1 - Never What is the last  grade level you completed in school?: Bachelor's Degree  Diabetic? yes  Interpreter Needed?: No  Information entered by :: Lisette Abu, LPN   Activities of Daily Living In your present state of health, do you have any difficulty performing the following activities: 02/05/2021  Hearing? Y  Vision? N  Difficulty concentrating or making decisions? N  Walking or climbing stairs? N  Dressing or bathing? N  Doing errands, shopping? N  Preparing Food and eating ? N  Using the Toilet? N  In the past six months, have you accidently leaked urine? N  Do you have problems with loss of bowel control? N  Managing your Medications? N  Managing your Finances? N  Housekeeping or managing your Housekeeping? N  Some recent data might be hidden    Patient Care Team: Plotnikov, Evie Lacks, MD as PCP - General Josue Hector, MD as PCP - Cardiology (Cardiology) Irene Shipper, MD as Consulting Physician (Gastroenterology)  Indicate any recent Medical Services you may have received from other than [facility] providers in the past year (date may be approximate).     Assessment:   This is a routine wellness examination for [facility].  Hearing/Vision screen Hearing Screening - Comments:: Mild hearing loss. No hearing aids.  Vision Screening - Comments:: Patient wears corrective glasses/contacts.  Eye exam done annually by: [facility].  Dietary issues and exercise activities  discussed: Current Exercise Habits: Home exercise routine, Type of exercise: Other - see comments (swimming), Time (Minutes): 25, Frequency (Times/Week): 7, Weekly Exercise (Minutes/Week): 175, Intensity: Moderate   Goals Addressed   None   Depression Screen PHQ 2/9 Scores 02/05/2021 10/15/2020 06/22/2017 06/02/2016  PHQ - 2 Score 0 0 2 0  PHQ- 9 Score - 2 4 -    Fall Risk Fall Risk  02/05/2021 10/15/2020 12/02/2017 06/02/2016  Falls in the past year? 0 0 Yes No  Comment - - Emmi Telephone Survey: data to providers prior to load -  Number falls in past yr: 0 0 1 -  Comment - - Emmi Telephone Survey Actual Response = 1 -  Injury with Fall? 0 0 No -  Risk for fall due to : No Fall Risks No Fall Risks - -  Follow up Falls evaluation completed - - -    FALL RISK PREVENTION PERTAINING TO THE HOME:  Any stairs in or around the home? No  If so, are there any without handrails? No  Home free of loose throw rugs in walkways, pet beds, electrical cords, etc? Yes  Adequate lighting in your home to reduce risk of falls? Yes   ASSISTIVE DEVICES UTILIZED TO PREVENT FALLS:  Life alert? No  Use of a cane, walker or w/c? No  Grab bars in the bathroom? No  Shower chair or bench in shower? No  Elevated toilet seat or a handicapped toilet? Yes   TIMED UP AND GO:  Was the test performed? No .  Length of time to ambulate 10 feet: 5 sec.   Gait steady and fast without use of assistive device  Cognitive Function: Normal cognitive status assessed by direct observation by this Nurse Health Advisor. No abnormalities found.   MMSE - Mini Mental State Exam 06/22/2017  Orientation to time 5  Orientation to Place 5  Registration 3  Attention/ Calculation 5  Recall 3  Language- name 2 objects 2  Language- repeat 1  Language- follow 3 step command 3  Language- read & follow direction 1  Write  a sentence 1  Copy design 1  Total score 30        Immunizations Immunization History  Administered  Date(s) Administered   Fluad Quad(high Dose 65+) 01/30/2021   Influenza Split 03/22/2009, 03/24/2010, 04/11/2012, 04/13/2013, 01/26/2014   Influenza Whole 03/22/2009, 03/24/2010   Influenza, High Dose Seasonal PF 03/18/2016, 02/17/2018, 12/23/2018, 01/22/2020   Influenza, Seasonal, Injecte, Preservative Fre 04/11/2012   Influenza,inj,Quad PF,6+ Mos 04/13/2013, 01/26/2014   Moderna Sars-Covid-2 Vaccination 07/04/2019, 08/01/2019, 02/23/2020, 08/29/2020   Pneumococcal Conjugate-13 08/08/2013   Pneumococcal Polysaccharide-23 04/11/2012, 05/28/2020   Tdap 04/18/2014   Zoster, Live 04/18/2014    TDAP status: Up to date  Flu Vaccine status: Up to date  Pneumococcal vaccine status: Up to date  Covid-19 vaccine status: Completed vaccines  Qualifies for Shingles Vaccine? Yes   Zostavax completed Yes   Shingrix Completed?: Yes (need documentation from pharmacy per patient)  Screening Tests Health Maintenance  Topic Date Due   Zoster Vaccines- Shingrix (1 of 2) Never done   FOOT EXAM  06/22/2018   COLONOSCOPY (Pts 45-9yrs Insurance coverage will need to be confirmed)  04/01/2020   COVID-19 Vaccine (5 - Booster for Moderna series) 12/29/2020   HEMOGLOBIN A1C  04/16/2021   OPHTHALMOLOGY EXAM  01/09/2022   TETANUS/TDAP  04/18/2024   INFLUENZA VACCINE  Completed   Hepatitis C Screening  Completed   HPV VACCINES  Aged Out    Health Maintenance  Health Maintenance Due  Topic Date Due   Zoster Vaccines- Shingrix (1 of 2) Never done   FOOT EXAM  06/22/2018   COLONOSCOPY (Pts 45-52yrs Insurance coverage will need to be confirmed)  04/01/2020   COVID-19 Vaccine (5 - Booster for Moderna series) 12/29/2020    Colorectal cancer screening: Type of screening: Colonoscopy. Completed 04/01/2017. Repeat every 3 years  Lung Cancer Screening: (Low Dose CT Chest recommended if Age 56-80 years, 30 pack-year currently smoking OR have quit w/in 15years.) does not qualify.   Lung Cancer Screening  Referral: no  Additional Screening:  Hepatitis C Screening: does qualify; Completed yes  Vision Screening: Recommended annual ophthalmology exams for early detection of glaucoma and other disorders of the eye. Is the patient up to date with their annual eye exam?  Yes  Who is the provider or what is the name of the office in which the patient attends annual eye exams? [facility] If pt is not established with a provider, would they like to be referred to a provider to establish care? No .   Dental Screening: Recommended annual dental exams for proper oral hygiene  Community Resource Referral / Chronic Care Management: CRR required this visit?  No   CCM required this visit?  No      Plan:     I have personally reviewed and noted the following in the patient's chart:   Medical and social history Use of alcohol, tobacco or illicit drugs  Current medications and supplements including opioid prescriptions. Patient is not currently taking opioid prescriptions. Functional ability and status Nutritional status Physical activity Advanced directives List of other physicians Hospitalizations, surgeries, and ER visits in previous 12 months Vitals Screenings to include cognitive, depression, and falls Referrals and appointments  In addition, I have reviewed and discussed with patient certain preventive protocols, quality metrics, and best practice recommendations. A written personalized care plan for preventive services as well as general preventive health recommendations were provided to patient.     Sheral Flow, LPN   75/01/1637  Nurse Notes:  Hearing Screening - Comments:: Mild hearing loss. No hearing aids.  Vision Screening - Comments:: Patient wears corrective glasses/contacts.  Eye exam done annually by: [facility].  Medical screening examination/treatment/procedure(s) were performed by non-physician  practitioner and as supervising physician I was immediately available for consultation/collaboration.  I agree with above. Lew Dawes, MD

## 2021-02-05 NOTE — Patient Instructions (Signed)
Jared Alvarez , Thank you for taking time to come for your Medicare Wellness Visit. I appreciate your ongoing commitment to your health goals. Please review the following plan we discussed and let me know if I can assist you in the future.   Screening recommendations/referrals: Colonoscopy: last done 04/01/2017; due every 3  years Recommended yearly ophthalmology/optometry visit for glaucoma screening and checkup Recommended yearly dental visit for hygiene and checkup  Vaccinations: Influenza vaccine: 01/30/2021 Pneumococcal vaccine: 08/08/2013, 05/28/2020 Tdap vaccine: 04/18/2014; due every 10 years Shingles vaccine: done in [place], [place]: 07/04/2019, 08/01/2019, 02/23/2020, 08/29/2020  Advanced directives: Please bring a copy of your health care power of attorney and living will to the office at your convenience.  Conditions/risks identified: Yes; Client understands the importance of follow-up with providers by attending scheduled visits and discussed goals to eat healthier, increase physical activity, exercise the brain, socialize more, get enough sleep and make time for laughter.  Next appointment: Please schedule your next Medicare Wellness Visit with your Nurse Health Advisor in 1 year by calling [phone].  Preventive Care 66 Years and Older, Male Preventive care refers to lifestyle choices and visits with your health care provider that can promote health and wellness. What does preventive care include? A yearly physical exam. This is also called an annual well check. Dental exams once or twice a year. Routine eye exams. Ask your health care provider how often you should have your eyes checked. Personal lifestyle choices, including: Daily care of your teeth and gums. Regular physical activity. Eating a healthy diet. Avoiding tobacco and drug use. Limiting alcohol use. Practicing safe sex. Taking low doses of aspirin every day. Taking vitamin and mineral supplements as  recommended by your health care provider. What happens during an annual well check? The services and screenings done by your health care provider during your annual well check will depend on your age, overall health, lifestyle risk factors, and family history of disease. Counseling  Your health care provider may ask you questions about your: Alcohol use. Tobacco use. Drug use. Emotional well-being. Home and relationship well-being. Sexual activity. Eating habits. History of falls. Memory and ability to understand (cognition). Work and work Statistician. Screening  You may have the following tests or measurements: Height, weight, and BMI. Blood pressure. Lipid and cholesterol levels. These may be checked every 5 years, or more frequently if you are over 79 years old. Skin check. Lung cancer screening. You may have this screening every year starting at age 50 if you have a 30-pack-year history of smoking and currently smoke or have quit within the past 15 years. Fecal occult blood test (FOBT) of the stool. You may have this test every year starting at age 19. Flexible sigmoidoscopy or colonoscopy. You may have a sigmoidoscopy every 5 years or a colonoscopy every 10 years starting at age 52. Prostate cancer screening. Recommendations will vary depending on your family history and other risks. Hepatitis C blood test. Hepatitis B blood test. Sexually transmitted disease (STD) testing. Diabetes screening. This is done by checking your blood sugar (glucose) after you have not eaten for a while (fasting). You may have this done every 1-3 years. Abdominal aortic aneurysm (AAA) screening. You may need this if you are a current or former smoker. Osteoporosis. You may be screened starting at age 28 if you are at high risk. Talk with your health care provider about your test results, treatment options, and if necessary, the need for more tests. Vaccines  Your health care provider may recommend  certain vaccines, such as: Influenza vaccine. This is recommended every year. Tetanus, diphtheria, and acellular pertussis (Tdap, Td) vaccine. You may need a Td booster every 10 years. Zoster vaccine. You may need this after age 6. Pneumococcal 13-valent conjugate (PCV13) vaccine. One dose is recommended after age 29. Pneumococcal polysaccharide (PPSV23) vaccine. One dose is recommended after age 22. Talk to your health care provider about which screenings and vaccines you need and how often you need them. This information is not intended to replace advice given to you by your health care provider. Make sure you discuss any questions you have with your health care provider. Document Released: 05/17/2015 Document Revised: 01/08/2016 Document Reviewed: 02/19/2015 Elsevier Interactive Patient Education  2017 [employer] Prevention in the Home Falls can cause injuries. They can happen to people of all ages. There are many things you can do to make your home safe and to help prevent falls. What can I do on the outside of my home? Regularly fix the edges of walkways and driveways and fix any cracks. Remove anything that might make you trip as you walk through a door, such as a raised step or threshold. Trim any bushes or trees on the path to your home. Use bright outdoor lighting. Clear any walking paths of anything that might make someone trip, such as rocks or tools. Regularly check to see if handrails are loose or broken. Make sure that both sides of any steps have handrails. Any raised decks and porches should have guardrails on the edges. Have any leaves, snow, or ice cleared regularly. Use sand or salt on walking paths during winter. Clean up any spills in your garage right away. This includes oil or grease spills. What can I do in the bathroom? Use night lights. Install grab bars by the toilet and in the tub and shower. Do not use towel bars as grab bars. Use non-skid mats or  decals in the tub or shower. If you need to sit down in the shower, use a plastic, non-slip stool. Keep the floor dry. Clean up any water that spills on the floor as soon as it happens. Remove soap buildup in the tub or shower regularly. Attach bath mats securely with double-sided non-slip rug tape. Do not have throw rugs and other things on the floor that can make you trip. What can I do in the bedroom? Use night lights. Make sure that you have a light by your bed that is easy to reach. Do not use any sheets or blankets that are too big for your bed. They should not hang down onto the floor. Have a firm chair that has side arms. You can use this for support while you get dressed. Do not have throw rugs and other things on the floor that can make you trip. What can I do in the kitchen? Clean up any spills right away. Avoid walking on wet floors. Keep items that you use a lot in easy-to-reach places. If you need to reach something above you, use a strong step stool that has a grab bar. Keep electrical cords out of the way. Do not use floor polish or wax that makes floors slippery. If you must use wax, use non-skid floor wax. Do not have throw rugs and other things on the floor that can make you trip. What can I do with my stairs? Do not leave any items on the stairs. Make sure that there are  handrails on both sides of the stairs and use them. Fix handrails that are broken or loose. Make sure that handrails are as long as the stairways. Check any carpeting to make sure that it is firmly attached to the stairs. Fix any carpet that is loose or worn. Avoid having throw rugs at the top or bottom of the stairs. If you do have throw rugs, attach them to the floor with carpet tape. Make sure that you have a light switch at the top of the stairs and the bottom of the stairs. If you do not have them, ask someone to add them for you. What else can I do to help prevent falls? Wear shoes that: Do not  have high heels. Have rubber bottoms. Are comfortable and fit you well. Are closed at the toe. Do not wear sandals. If you use a stepladder: Make sure that it is fully opened. Do not climb a closed stepladder. Make sure that both sides of the stepladder are locked into place. Ask someone to hold it for you, if possible. Clearly mark and make sure that you can see: Any grab bars or handrails. First and last steps. Where the edge of each step is. Use tools that help you move around (mobility aids) if they are needed. These include: Canes. Walkers. Scooters. Crutches. Turn on the lights when you go into a dark area. Replace any light bulbs as soon as they burn out. Set up your furniture so you have a clear path. Avoid moving your furniture around. If any of your floors are uneven, fix them. If there are any pets around you, be aware of where they are. Review your medicines with your doctor. Some medicines can make you feel dizzy. This can increase your chance of falling. Ask your doctor what other things that you can do to help prevent falls. This information is not intended to replace advice given to you by your health care provider. Make sure you discuss any questions you have with your health care provider. Document Released: 02/14/2009 Document Revised: 09/26/2015 Document Reviewed: 05/25/2014 Elsevier Interactive Patient Education  2017 [employer].

## 2021-02-28 DIAGNOSIS — Z23 Encounter for immunization: Secondary | ICD-10-CM | POA: Diagnosis not present

## 2021-04-03 ENCOUNTER — Encounter: Payer: Self-pay | Admitting: Internal Medicine

## 2021-04-08 ENCOUNTER — Encounter: Payer: Self-pay | Admitting: Internal Medicine

## 2021-04-08 ENCOUNTER — Ambulatory Visit (INDEPENDENT_AMBULATORY_CARE_PROVIDER_SITE_OTHER): Payer: Medicare Other | Admitting: Internal Medicine

## 2021-04-08 ENCOUNTER — Other Ambulatory Visit: Payer: Self-pay

## 2021-04-08 VITALS — BP 124/68 | HR 71 | Resp 18 | Ht 68.0 in | Wt 176.4 lb

## 2021-04-08 DIAGNOSIS — L255 Unspecified contact dermatitis due to plants, except food: Secondary | ICD-10-CM | POA: Diagnosis not present

## 2021-04-08 DIAGNOSIS — L237 Allergic contact dermatitis due to plants, except food: Secondary | ICD-10-CM | POA: Diagnosis not present

## 2021-04-08 MED ORDER — METHYLPREDNISOLONE ACETATE 40 MG/ML IJ SUSP
40.0000 mg | Freq: Once | INTRAMUSCULAR | Status: AC
  Administered 2021-04-08: 40 mg via INTRAMUSCULAR

## 2021-04-08 MED ORDER — TRIAMCINOLONE ACETONIDE 0.1 % EX CREA: 1.0000 "application " | TOPICAL_CREAM | Freq: Two times a day (BID) | CUTANEOUS | 0 refills | Status: DC

## 2021-04-08 NOTE — Patient Instructions (Addendum)
We will send in the triamcinolone ointment to use twice a day.  We have given you the steroid shot today which should get rid of this.

## 2021-04-08 NOTE — Progress Notes (Signed)
   Subjective:   Patient ID: Jared Alvarez, male    DOB: [birth year], 70 y.o.   MRN: [id number]  HPI The patient is a 70 YO man coming in for rash possible poison ivy. Started 1 week ago.   Review of Systems  Constitutional: Negative.   HENT: Negative.    Eyes: Negative.   Respiratory:  Negative for cough, chest tightness and shortness of breath.   Cardiovascular:  Negative for chest pain, palpitations and leg swelling.  Gastrointestinal:  Negative for abdominal distention, abdominal pain, constipation, diarrhea, nausea and vomiting.  Musculoskeletal: Negative.   Skin:  Positive for rash.  Neurological: Negative.   Psychiatric/Behavioral: Negative.     Objective:  Physical Exam Constitutional:      Appearance: He is well-developed.  HENT:     Head: Normocephalic and atraumatic.  Cardiovascular:     Rate and Rhythm: Normal rate and regular rhythm.  Pulmonary:     Effort: Pulmonary effort is normal. No respiratory distress.     Breath sounds: Normal breath sounds. No wheezing or rales.  Abdominal:     General: Bowel sounds are normal. There is no distension.     Palpations: Abdomen is soft.     Tenderness: There is no abdominal tenderness. There is no rebound.  Musculoskeletal:     Cervical back: Normal range of motion.  Skin:    General: Skin is warm and dry.     Findings: Rash present.     Comments: Red rash consistent with contact dermatitis on the arms and legs  Neurological:     Mental Status: He is alert and oriented to person, place, and time.     Coordination: Coordination normal.    Vitals:   04/08/21 1604  BP: 124/68  Pulse: 71  Resp: 18  SpO2: 98%  Weight: 176 lb 6.4 oz (80 kg)  Height: 5\' 8"  (1.727 m)    This visit occurred during the SARS-CoV-2 public health emergency.  Safety protocols were in place, including screening questions prior to the visit, additional usage of staff PPE, and extensive cleaning of exam room while observing appropriate  contact time as indicated for disinfecting solutions.   Assessment & Plan:  Depo-medrol 40 mg IM given at visit

## 2021-04-09 DIAGNOSIS — L255 Unspecified contact dermatitis due to plants, except food: Secondary | ICD-10-CM | POA: Insufficient documentation

## 2021-04-09 NOTE — Assessment & Plan Note (Signed)
Rx triamcinolone ointment and given depo-medrol 40 mg IM.

## 2021-04-16 ENCOUNTER — Encounter: Payer: Self-pay | Admitting: Internal Medicine

## 2021-04-16 ENCOUNTER — Other Ambulatory Visit: Payer: Self-pay | Admitting: Internal Medicine

## 2021-04-24 ENCOUNTER — Ambulatory Visit (AMBULATORY_SURGERY_CENTER): Payer: Medicare Other | Admitting: *Deleted

## 2021-04-24 ENCOUNTER — Other Ambulatory Visit: Payer: Self-pay

## 2021-04-24 VITALS — Ht 68.0 in | Wt 173.0 lb

## 2021-04-24 DIAGNOSIS — Z86010 Personal history of colonic polyps: Secondary | ICD-10-CM

## 2021-04-24 MED ORDER — NA SULFATE-K SULFATE-MG SULF 17.5-3.13-1.6 GM/177ML PO SOLN: 1.0000 | Freq: Once | ORAL | 0 refills | Status: AC

## 2021-04-24 NOTE — Progress Notes (Signed)
No egg or soy allergy known to patient  No issues known to pt with past sedation with any surgeries or procedures Patient denies ever being told they had issues or difficulty with intubation  No FH of Malignant Hyperthermia Pt is not on diet pills Pt is not on  home 02  Pt is not on blood thinners  Pt denies issues with constipation  No A fib or A flutter  Pt is fully vaccinated  for Covid    NO PA's for preps discussed with pt In PV today  Discussed with pt there will be an out-of-pocket cost for prep and that varies from $0 to 70 +  dollars - pt verbalized understanding   Due to the COVID-19 pandemic we are asking patients to follow certain guidelines in PV and the [place]   Pt aware of COVID protocols and [facility] guidelines   PV completed over the phone. Pt verified name, DOB, address and insurance during PV today.  Pt mailed instruction packet with copy of consent form to read and not return, and instruction Pt encouraged to call with questions or issues.  If pt has My chart, procedure instructions sent via My Chart

## 2021-04-30 ENCOUNTER — Telehealth: Payer: Self-pay

## 2021-04-30 MED ORDER — LOSARTAN POTASSIUM 100 MG PO TABS: 100.0000 mg | ORAL_TABLET | Freq: Every day | ORAL | 3 refills | Status: DC

## 2021-04-30 NOTE — Telephone Encounter (Signed)
Pt calling in requesting refill for Losartan 100 mg be sent to [facility]. Pharmacy advised pt to call in.

## 2021-04-30 NOTE — Telephone Encounter (Signed)
Done. Thanks.

## 2021-05-13 ENCOUNTER — Other Ambulatory Visit: Payer: Self-pay

## 2021-05-13 ENCOUNTER — Encounter: Payer: Self-pay | Admitting: Internal Medicine

## 2021-05-13 ENCOUNTER — Ambulatory Visit (AMBULATORY_SURGERY_CENTER): Payer: Medicare Other | Admitting: Internal Medicine

## 2021-05-13 VITALS — BP 116/71 | HR 64 | Temp 97.3°F | Resp 12 | Ht 68.0 in | Wt 173.0 lb

## 2021-05-13 DIAGNOSIS — Z86010 Personal history of colonic polyps: Secondary | ICD-10-CM

## 2021-05-13 DIAGNOSIS — D122 Benign neoplasm of ascending colon: Secondary | ICD-10-CM

## 2021-05-13 DIAGNOSIS — I10 Essential (primary) hypertension: Secondary | ICD-10-CM | POA: Diagnosis not present

## 2021-05-13 DIAGNOSIS — E119 Type 2 diabetes mellitus without complications: Secondary | ICD-10-CM | POA: Diagnosis not present

## 2021-05-13 DIAGNOSIS — I2510 Atherosclerotic heart disease of native coronary artery without angina pectoris: Secondary | ICD-10-CM | POA: Diagnosis not present

## 2021-05-13 MED ORDER — SODIUM CHLORIDE 0.9 % IV SOLN: 500.0000 mL | Freq: Once | INTRAVENOUS | Status: DC

## 2021-05-13 NOTE — Progress Notes (Signed)
Called to room to assist during endoscopic procedure.  Patient ID and intended procedure confirmed with present staff. Received instructions for my participation in the procedure from the performing physician.  

## 2021-05-13 NOTE — Progress Notes (Signed)
DT - VS   Pt's blood sugar was 73 on admission.  He was asymptomatic.  Our [facility] Protocol said no sx no treatment.  Jared Monday, CRNA was made aware.  Pt's states no medical or surgical changes since previsit or office visit.

## 2021-05-13 NOTE — Progress Notes (Signed)
HISTORY OF PRESENT ILLNESS:  Jared Alvarez is a 71 y.o. male for surveillance colonoscopy due to history of multiple adenomatous colon polyps.  No active complaints  REVIEW OF SYSTEMS:  All non-GI ROS negative. Past Medical History:  Diagnosis Date   Allergy    Anxiety    no per pt   Aortic aneurysm ([place])    Coronary artery disease    Depression    no per pt   DM type 2 (diabetes mellitus, type 2) (HCC)    GERD (gastroesophageal reflux disease)    Gout    Hearing loss    Hyperlipidemia    Hypertension    Hypogonadism male    Inguinal hernia    right   LBP (low back pain)    Leg cramps    Osteoarthritis    Skin neoplasm    Tinnitus    since 1997   Vertigo    Vitamin B12 deficiency     Past Surgical History:  Procedure Laterality Date   COLONOSCOPY     FOOT SURGERY     07-2019 triple bone fusion   FOOT SURGERY  08/2020   repeat fusion   heart stent     in [place]  reports that he has never smoked. He has never used smokeless tobacco. He reports current alcohol use of about 7.0 standard drinks per week. He reports that he does not use drugs.  family history includes Arthritis in his sister; Coronary artery disease in an other family member; Heart disease in his brother and father; Hypertension in an other family member; Lupus in his sister.  Allergies  Allergen Reactions   Atorvastatin     REACTION: pains   Bupropion Hcl     REACTION: side effects   Duloxetine     REACTION: dizzy   Fluticasone     Other reaction(s): other/intolerance   Nexium [Esomeprazole Magnesium]     Rash on legs   Simvastatin     REACTION: pain   Venlafaxine     REACTION: side effects   Viibryd [Vilazodone Hcl]     nightmares   Vilazodone     Other reaction(s): psychological reaction Nightmares    Ketorolac Palpitations   Tamsulosin Palpitations       PHYSICAL EXAMINATION:  Vital signs: BP  128/69    Pulse (!) 56    Temp (!) 97.3 F (36.3 C)    Resp 11    Ht 5\' 8"  (1.727 m)    Wt 173 lb (78.5 kg)    SpO2 99%    BMI 26.30 kg/m  General: Well-developed, well-nourished, no acute distress HEENT: Sclerae are anicteric, conjunctiva pink. Oral mucosa intact Lungs: Clear Heart: Regular Abdomen: soft, nontender, nondistended, no obvious ascites, no peritoneal signs, normal bowel sounds. No organomegaly. Extremities: No edema Psychiatric: alert and oriented x3. Cooperative     ASSESSMENT:  History of multiple adenomatous colon polyps due for surveillance   PLAN:  Surveillance colonoscopy

## 2021-05-13 NOTE — Op Note (Signed)
[place] [facility] Patient Name: Jared Alvarez Procedure Date: 05/13/2021 1:49 PM MRN: [id number] Endoscopist: Docia Chuck. Henrene Pastor , MD Age: 71 Referring MD:  Date of Birth: [birth year] Gender: Male Account #: [id number] Procedure:                Colonoscopy with cold snare polypectomy x 3 Indications:              High risk colon cancer surveillance: Personal                            history of multiple (3 or more) adenomas. Previous                            examinations 2003, 2013, 2018 Medicines:                Monitored Anesthesia Care Procedure:                Pre-Anesthesia Assessment:                           - Prior to the procedure, a History and Physical                            was performed, and patient medications and                            allergies were reviewed. The patient's tolerance of                            previous anesthesia was also reviewed. The risks                            and benefits of the procedure and the sedation                            options and risks were discussed with the patient.                            All questions were answered, and informed consent                            was obtained. Prior Anticoagulants: The patient has                            taken no previous anticoagulant or antiplatelet                            agents. ASA Grade Assessment: II - A patient with                            mild systemic disease. After reviewing the risks                            and benefits, the patient was deemed in  satisfactory condition to undergo the procedure.                           After obtaining informed consent, the colonoscope                            was passed under direct vision. Throughout the                            procedure, the patient's blood pressure, pulse, and                            oxygen saturations were monitored continuously. The                             Colonoscope was introduced through the anus and                            advanced to the the cecum, identified by                            appendiceal orifice and ileocecal valve. The                            ileocecal valve, appendiceal orifice, and rectum                            were photographed. The quality of the bowel                            preparation was excellent. The colonoscopy was                            performed without difficulty. The patient tolerated                            the procedure well. The bowel preparation used was                            SUPREP via split dose instruction. Scope In: 2:00:36 PM Scope Out: 2:16:45 PM Scope Withdrawal Time: 0 hours 11 minutes 9 seconds  Total Procedure Duration: 0 hours 16 minutes 9 seconds  Findings:                 Three polyps were found in the ascending colon. The                            polyps were 1 to 4 mm in size. These polyps were                            removed with a cold snare. Resection and retrieval                            were complete.  Multiple diverticula were found in the left colon                            and right colon.                           Internal hemorrhoids were found during retroflexion.                           The exam was otherwise without abnormality on                            direct and retroflexion views. Complications:            No immediate complications. Estimated blood loss:                            None. Estimated Blood Loss:     Estimated blood loss: none. Impression:               - Three 1 to 4 mm polyps in the ascending colon,                            removed with a cold snare. Resected and retrieved.                           - Diverticulosis in the left colon and in the right                            colon.                           - Internal hemorrhoids.                           - The examination was otherwise  normal on direct                            and retroflexion views. Recommendation:           - Repeat colonoscopy in 5 years for surveillance.                           - Patient has a contact number available for                            emergencies. The signs and symptoms of potential                            delayed complications were discussed with the                            patient. Return to normal activities tomorrow.                            Written discharge instructions were provided to the  patient.                           - Resume previous diet.                           - Continue present medications.                           - Await pathology results. Docia Chuck. Henrene Pastor, MD 05/13/2021 2:27:30 PM This report has been signed electronically.

## 2021-05-13 NOTE — Progress Notes (Signed)
Report to PACU, RN, vss, BBS= Clear.  

## 2021-05-13 NOTE — Patient Instructions (Signed)
Read all of the handouts given to you by your recovery room nurse. ? ?YOU HAD AN ENDOSCOPIC PROCEDURE TODAY AT THE [facility]:   Refer to the procedure report that was given to you for any specific questions about what was found during the examination.  If the procedure report does not answer your questions, please call your gastroenterologist to clarify.  If you requested that your care partner not be given the details of your procedure findings, then the procedure report has been included in a sealed envelope for you to review at your convenience later. ? ?YOU SHOULD EXPECT: Some feelings of bloating in the abdomen. Passage of more gas than usual.  Walking can help get rid of the air that was put into your GI tract during the procedure and reduce the bloating. If you had a lower endoscopy (such as a colonoscopy or flexible sigmoidoscopy) you may notice spotting of blood in your stool or on the toilet paper. If you underwent a bowel prep for your procedure, you may not have a normal bowel movement for a few days. ? ?Please Note:  You might notice some irritation and congestion in your nose or some drainage.  This is from the oxygen used during your procedure.  There is no need for concern and it should clear up in a day or so. ? ?SYMPTOMS TO REPORT IMMEDIATELY: ? ?Following lower endoscopy (colonoscopy or flexible sigmoidoscopy): ? Excessive amounts of blood in the stool ? Significant tenderness or worsening of abdominal pains ? Swelling of the abdomen that is new, acute ? Fever of 100?F or higher ? ?  ?For urgent or emergent issues, a gastroenterologist can be reached at any hour by calling [phone]. ?Do not use MyChart messaging for urgent concerns.  ? ? ?DIET:  We do recommend a small meal at first, but then you may proceed to your regular diet.  Drink plenty of fluids but you should avoid alcoholic beverages for 24 hours. ? ?ACTIVITY:  You should plan to take it easy for the rest of today  and you should NOT DRIVE or use heavy machinery until tomorrow (because of the sedation medicines used during the test).   ? ?FOLLOW UP: ?Our staff will call the number listed on your records 48-72 hours following your procedure to check on you and address any questions or concerns that you may have regarding the information given to you following your procedure. If we do not reach you, we will leave a message.  We will attempt to reach you two times.  During this call, we will ask if you have developed any symptoms of COVID 19. If you develop any symptoms (ie: fever, flu-like symptoms, shortness of breath, cough etc.) before then, please call [phone].  If you test positive for Covid 19 in the 2 weeks post procedure, please call and report this information to us.   ? ?If any biopsies were taken you will be contacted by phone or by letter within the next 1-3 weeks.  Please call us at [phone] if you have not heard about the biopsies in 3 weeks.  ? ? ?SIGNATURES/CONFIDENTIALITY: ?You and/or your care partner have signed paperwork which will be entered into your electronic medical record.  These signatures attest to the fact that that the information above on your After Visit Summary has been reviewed and is understood.  Full responsibility of the confidentiality of this discharge information lies with you and/or your care-partner.  ?

## 2021-05-15 ENCOUNTER — Telehealth: Payer: Self-pay

## 2021-05-15 NOTE — Telephone Encounter (Signed)
°  Follow up Call-  Call back number 05/13/2021  Post procedure Call Back phone  # [phone] cell  Permission to leave phone message Yes  Some recent data might be hidden     Patient questions:  Do you have a fever, pain , or abdominal swelling? No. Pain Score  0 *  Have you tolerated food without any problems? Yes.    Have you been able to return to your normal activities? Yes.    Do you have any questions about your discharge instructions: Diet   No. Medications  No. Follow up visit  No.  Do you have questions or concerns about your Care? No.  Actions: * If pain score is 4 or above: No action needed, pain <4.  Have you developed a fever since your procedure? no  2.   Have you had an respiratory symptoms (SOB or cough) since your procedure? no  3.   Have you tested positive for COVID 19 since your procedure no  4.   Have you had any family members/close contacts diagnosed with the COVID 19 since your procedure?  no   If yes to any of these questions please route to Joylene John, RN and Joella Prince, RN

## 2021-05-15 NOTE — Telephone Encounter (Signed)
Follow up call attempted, no answer, no VM. SChaplin, RN,BSN

## 2021-05-19 ENCOUNTER — Encounter: Payer: Self-pay | Admitting: Internal Medicine

## 2021-05-20 ENCOUNTER — Ambulatory Visit: Payer: Medicare Other | Admitting: Psychology

## 2021-05-26 ENCOUNTER — Ambulatory Visit (INDEPENDENT_AMBULATORY_CARE_PROVIDER_SITE_OTHER): Payer: Medicare Other | Admitting: Psychology

## 2021-05-26 DIAGNOSIS — F4323 Adjustment disorder with mixed anxiety and depressed mood: Secondary | ICD-10-CM

## 2021-05-26 NOTE — Progress Notes (Signed)
05/26/2021  Diagnosis unspecified Symptoms unspecified Medication Status compliance  Safety none  If Suicidal or Homicidal State Action Taken: unspecified  Current Risk: low Medications unspecified Objectives unspecified Client Response full compliance  Service Location Location, [address] [place], [place] [address]  Service Code cpt [phone]  Related past to present  Identified an insight  Develop/support self-efficacy  Rationally challenge thoughts or beliefs/cognitive restructuring  Identify/label emotions  Validate/empathize  Distress tolerance skill  Self care activities  Lifestyle change (exercise, nutrition)  Self-monitoring  Comments  Dx.: F41.1, F34.1  Meds.: Fluoxetine (40mg .), metformin, Losartan, Amlodipine, Famotidine  Goals: Wants to address depleted self-esteem and confidence. Additionally is seeking to better understand marital relationship, relationship expectations and how (or if) it is compatible with his life goals. Goal date 12-23  Patient agrees to having a video session due to the Coronavirus. He is at home and I am at my home office.  Gershon Mussel says that the past 6 months has been full of angst on many levels....kids finishing school and working, [name] slowing down on job and relationship strain. He says that he has come to realize that he is "comfortable playing the victim". He also feels that much of what [name] says to him and about him are projections of what is going on with her. He spent time with his daughter this past week at her apartment in [place]. He states that he had a good time and was struck about how relaxed he felt and the dramatic contrast to what he normally feels. He says they have had arguments that have resulted in him  telling [name] that "I don't know how we can go on". He feels time is running out and he needs to tell her that he needs some space in order to heal. [name] stopped working at the end of December and tom is concerned  because they are so different. We discussed the need for a plan moving forward.      Marcelina Morel, PhD Time"1:15p-2:00p 50 minutes.

## 2021-06-03 ENCOUNTER — Ambulatory Visit (INDEPENDENT_AMBULATORY_CARE_PROVIDER_SITE_OTHER): Payer: Medicare Other | Admitting: Psychology

## 2021-06-03 DIAGNOSIS — F4323 Adjustment disorder with mixed anxiety and depressed mood: Secondary | ICD-10-CM | POA: Diagnosis not present

## 2021-06-03 NOTE — Progress Notes (Signed)
° ° ° ° ° ° °  06/03/2021  Diagnosis unspecified Symptoms unspecified Medication Status compliance  Safety none  If Suicidal or Homicidal State Action Taken: unspecified  Current Risk: low Medications unspecified Objectives unspecified Client Response full compliance  Service Location Location, [address] [place], [place] [address]  Service Code cpt [phone]  Related past to present  Identified an insight  Develop/support self-efficacy  Rationally challenge thoughts or beliefs/cognitive restructuring  Identify/label emotions  Validate/empathize  Distress tolerance skill  Self care activities  Lifestyle change (exercise, nutrition)  Self-monitoring  Comments  Dx.: F41.1, F34.1  Meds.: Fluoxetine (40mg .), metformin, Losartan, Amlodipine, Famotidine  Goals: Wants to address depleted self-esteem and confidence. Additionally is seeking to better understand marital relationship, relationship expectations and how (or if) it is compatible with his life goals. Goal date 12-23  Patient agrees to having a video session due to the Coronavirus. He is at home and I am at my home office.  Jared Alvarez says "it has been an interesting 1 1/2 weeks". He says he is not protecting himself well if [name] decides to turn on him. He says "I am a mouse and she is a mouse trap", adding that she "turns on a dime". This makes [name] feel he is in a dangerous situation. When asked what would help him feel "safe", he responds that he needs friends. He has struggled have close, intimate friendships. He says that he feels afraid of "so many things" and that this feeling controls him. I talked with him about "protecting" himself financially/materially and establishing a network of friends.          Marcelina Morel, PhD Time: 1:15p-2:00p 45 minutes.

## 2021-06-10 ENCOUNTER — Ambulatory Visit (INDEPENDENT_AMBULATORY_CARE_PROVIDER_SITE_OTHER): Payer: Medicare Other | Admitting: Psychology

## 2021-06-10 DIAGNOSIS — F4323 Adjustment disorder with mixed anxiety and depressed mood: Secondary | ICD-10-CM | POA: Diagnosis not present

## 2021-06-10 NOTE — Progress Notes (Signed)
° ° ° ° ° ° ° ° ° ° ° ° ° ° ° ° ° ° ° ° ° °  06/10/2021  Diagnosis unspecified Symptoms unspecified Medication Status compliance  Safety none  If Suicidal or Homicidal State Action Taken: unspecified  Current Risk: low Medications unspecified Objectives unspecified Client Response full compliance  Service Location Location, [address] [place], [place] [address]  Service Code cpt [phone]  Related past to present  Identified an insight  Develop/support self-efficacy  Rationally challenge thoughts or beliefs/cognitive restructuring  Identify/label emotions  Validate/empathize  Distress tolerance skill  Self care activities  Lifestyle change (exercise, nutrition)  Self-monitoring  Comments  Dx.: F41.1, F34.1  Meds.: Fluoxetine (40mg .), metformin, Losartan, Amlodipine, Famotidine  Goals: Wants to address depleted self-esteem and confidence. Additionally is seeking to better understand marital relationship, relationship expectations and how (or if) it is compatible with his life goals. Goal date 12-23  Patient agrees to having a video session due to the Coronavirus. He is at home and I am at my home office.  Jared Alvarez says he is "not having a good week". He is having terrible dreams and struggles to put together a theme. He is agitated and feels on the verge of making some moves/decisions. He talked about his desire to buy/get a grand piano. He found one and is fearful of what [name] will say. He is overwhelmed and wonders "how long I can feel bad". He is taking his meds and not sure it is helping. He is fundamentally unhappy, but feels paralyzed in making a decision.           Jared Morel, PhD Time: 1:15p-2:00p 45 minutes.

## 2021-07-11 DIAGNOSIS — U071 COVID-19: Secondary | ICD-10-CM | POA: Diagnosis not present

## 2021-07-15 NOTE — Progress Notes (Signed)
CARDIOLOGY CONSULT NOTE  ? ? ? ? ? ?Patient ID: ?Jared Alvarez ?MRN: [id number] ?DOB/AGE: 11-Dec-1950 71 y.o. ? ?Admit date: (Not on file) ?Referring Physician: [name] ?Primary Physician: Cassandria Anger, MD ?Primary Cardiologist: New ?Reason for Consultation: CAD ? ?Active Problems: ?  * No active hospital problems. * ? ? ?HPI:  71 y.o. referred by Dr Laurian Brim for CAD. Last seen by me in 2019 History of stent to LAD in 1997 in [place].  CRF;s include HTN, HLD , DM Known false positive ECG;s on stress testing normal perfusion on test 01/25/18 History of neurally mediated syncope since teenage years Has had PVC;s in past with no significant NSVT  Monitor with nocturnal bradycardia so has not been on beta blocker EF normal on TTE 01/06/18 Thoracic aorta 4.2 cm on CT 2019  ? ?Sees Dr Henrene Pastor for colon polyps 3 more polyps removed 05/13/21  ? ?He had been living in [place] during [place] and seeing a cardiologist at [facility] He indicates having a CT scan last spring and aortic aneurysm gone down in size  ? ?He still plays the base. He is active swimming daily and renovating a house. BP low and norvasc decreased by primary  ? ?ROS ?All other systems reviewed and negative except as noted above ? ?Past Medical History:  ?Diagnosis Date  ? Allergy   ? Anxiety   ? no per pt  ? Aortic aneurysm ([place])   ? Coronary artery disease   ? Depression   ? no per pt  ? DM type 2 (diabetes mellitus, type 2) ([place])   ? GERD (gastroesophageal reflux disease)   ? Gout   ? Hearing loss   ? Hyperlipidemia   ? Hypertension   ? Hypogonadism male   ? Inguinal hernia   ? right  ? LBP (low back pain)   ? Leg cramps   ? Osteoarthritis   ? Skin neoplasm   ? Tinnitus   ? since 1997  ? Vertigo   ? Vitamin B12 deficiency   ?  ?Family History  ?Problem Relation Age of Onset  ? Heart disease Father   ? Arthritis Sister   ? Lupus Sister   ? Heart disease Brother   ? Coronary artery disease Other   ? Hypertension Other   ? Colon cancer Neg Hx   ? Esophageal  cancer Neg Hx   ? Stomach cancer Neg Hx   ? Rectal cancer Neg Hx   ? Colon polyps Neg Hx   ?  ?Social History  ? ?Socioeconomic History  ? Marital status: Married  ?  Spouse name: Not on file  ? Number of children: Not on file  ? Years of education: Not on file  ? Highest education level: Not on file  ?Occupational History  ? Occupation: Therapist, art  ?  Employer: [employer]  ?Tobacco Use  ? Smoking status: Never  ? Smokeless tobacco: Never  ?Vaping Use  ? Vaping Use: Never used  ?Substance and Sexual Activity  ? Alcohol use: Yes  ?  Alcohol/week: 7.0 standard drinks  ?  Types: 7 Glasses of wine per week  ?  Comment: socially  ? Drug use: No  ? Sexual activity: Yes  ?Other Topics Concern  ? Not on file  ?Social History Narrative  ? Not on file  ? ?Social Determinants of Health  ? ?Financial Resource Strain: Low Risk   ? Difficulty of Paying Living Expenses: Not hard at all  ?  Food Insecurity: No Food Insecurity  ? Worried About Charity fundraiser in the Last Year: Never true  ? Ran Out of Food in the Last Year: Never true  ?Transportation Needs: No Transportation Needs  ? Lack of Transportation (Medical): No  ? Lack of Transportation (Non-Medical): No  ?Physical Activity: Sufficiently Active  ? Days of Exercise per Week: 7 days  ? Minutes of Exercise per Session: 30 min  ?Stress: No Stress Concern Present  ? Feeling of Stress : Not at all  ?Social Connections: Unknown  ? Frequency of Communication with Friends and Family: More than three times a week  ? Frequency of Social Gatherings with Friends and Family: More than three times a week  ? Attends Religious Services: Patient refused  ? Active Member of Clubs or Organizations: Patient refused  ? Attends Archivist Meetings: Patient refused  ? Marital Status: Married  ?Intimate Partner Violence: Not At Risk  ? Fear of Current or Ex-Partner: No  ? Emotionally Abused: No  ? Physically Abused: No  ? Sexually Abused: No  ?  ?Past  Surgical History:  ?Procedure Laterality Date  ? COLONOSCOPY    ? FOOT SURGERY    ? 07-2019 triple bone fusion  ? FOOT SURGERY  08/2020  ? repeat fusion  ? heart stent    ? in 1997  ? INGUINAL HERNIA REPAIR    ? POLYPECTOMY    ?  ? ? ?Current Outpatient Medications:  ?  acetaminophen (TYLENOL) 500 MG tablet, Take 1 tablet (500 mg total) by mouth every 6 (six) hours as needed., Disp: 30 tablet, Rfl: 2 ?  amLODipine (NORVASC) 5 MG tablet, Take 1 tablet (5 mg total) by mouth daily., Disp: 90 tablet, Rfl: 3 ?  aspirin 81 MG tablet, Take 1 tablet (81 mg total) by mouth daily., Disp: 30 tablet, Rfl: 2 ?  cetirizine (ZYRTEC) 10 MG tablet, Take 1 tablet (10 mg total) by mouth daily., Disp: 30 tablet, Rfl: 11 ?  colchicine 0.6 MG tablet, Take 1 tablet (0.6 mg total) by mouth 2 (two) times daily. If patient has flare up with gout, Disp: 30 tablet, Rfl: 5 ?  Cyanocobalamin (VITAMIN B-12) 500 MCG SUBL, Place 1 tablet (500 mcg total) under the tongue daily., Disp: 150 tablet, Rfl: 2 ?  doxazosin (CARDURA) 2 MG tablet, Take 2 mg by mouth daily., Disp: , Rfl:  ?  FLUoxetine (PROZAC) 40 MG capsule, TAKE ONE CAPSULE BY MOUTH DAILY, Disp: 90 capsule, Rfl: 1 ?  Homeopathic Products (ARNICARE ARNICA) OINT, Apply 1 application topically 2 (two) times daily as needed (arthritis)., Disp: , Rfl:  ?  ibuprofen (ADVIL) 200 MG tablet, Ibuprofen cream PRN, Disp: , Rfl:  ?  IBUPROFEN-LINIMENT CO, by Combination route., Disp: , Rfl:  ?  LORazepam (ATIVAN) 0.5 MG tablet, Take by mouth. Take 1/2 tab by mouth at bedtime, Disp: , Rfl:  ?  losartan (COZAAR) 100 MG tablet, Take 1 tablet (100 mg total) by mouth daily., Disp: 90 tablet, Rfl: 3 ?  metFORMIN (GLUCOPHAGE) 1000 MG tablet, TAKE ONE TABLET BY MOUTH TWICE A DAY WITH A MEAL, Disp: 180 tablet, Rfl: 1 ?  nitroGLYCERIN (NITROSTAT) 0.4 MG SL tablet, Place 1 tablet (0.4 mg total) under the tongue every 5 (five) minutes as needed for chest pain., Disp: 25 tablet, Rfl: 3 ?  pantoprazole (PROTONIX) 40  MG tablet, TAKE ONE TABLET BY MOUTH TWICE A DAY, Disp: 180 tablet, Rfl: 1 ?  rosuvastatin (CRESTOR) 20 MG  tablet, Take 20 mg by mouth 3 (three) times a week. Mon, Wed, and Fri, Disp: , Rfl:  ?  tadalafil (CIALIS) 20 MG tablet, Take 1 tablet (20 mg total) by mouth every other day as needed for erectile dysfunction., Disp: 10 tablet, Rfl: 5 ?  triamcinolone (NASACORT) 55 MCG/ACT AERO nasal inhaler, Place 2 sprays into the nose daily., Disp: 1 each, Rfl: 12 ?  triamcinolone cream (KENALOG) 0.1 %, Apply 1 application topically 2 (two) times daily., Disp: 100 g, Rfl: 0 ? ?Current Facility-Administered Medications:  ?  0.9 %  sodium chloride infusion, 500 mL, Intravenous, Once, Irene Shipper, MD ? ? sodium chloride    ? ? ?Physical Exam: ?There were no vitals taken for this visit.  ?  ?Affect appropriate ?Healthy:  appears stated age ?HEENT: normal ?Neck supple with no adenopathy ?JVP normal no bruits no thyromegaly ?Lungs clear with no wheezing and good diaphragmatic motion ?Heart:  S1/S2 no murmur, no rub, gallop or click ?PMI normal ?Abdomen: benighn, BS positve, no tenderness, no AAA ?no bruit.  No HSM or HJR ?Distal pulses intact with no bruits ?No edema ?Neuro non-focal ?Skin warm and dry ?No muscular weakness ? ? ?Labs: ?  ?Lab Results  ?Component Value Date  ? WBC 5.5 07/23/2021  ? HGB 11.8 (L) 07/23/2021  ? HCT 35.2 (L) 07/23/2021  ? MCV 94.1 07/23/2021  ? PLT 248.0 07/23/2021  ?  ?Recent Labs  ?Lab 07/23/21 ?1006  ?NA 139  ?K 4.8  ?CL 104  ?CO2 26  ?BUN 31*  ?CREATININE 1.17  ?CALCIUM 9.5  ?PROT 6.7  ?BILITOT 0.5  ?ALKPHOS 108  ?ALT 13  ?AST 16  ?GLUCOSE 102*  ? ?No results found for: CKTOTAL, CKMB, CKMBINDEX, TROPONINI  ?Lab Results  ?Component Value Date  ? CHOL 142 07/23/2021  ? CHOL 139 10/15/2020  ? CHOL 150 05/17/2018  ? ?Lab Results  ?Component Value Date  ? HDL 67.90 07/23/2021  ? HDL 57.40 10/15/2020  ? HDL 63.40 05/17/2018  ? ?Lab Results  ?Component Value Date  ? [place] 68 07/23/2021  ? [place] 72  10/15/2020  ? [place] 77 05/17/2018  ? ?Lab Results  ?Component Value Date  ? TRIG 29.0 07/23/2021  ? TRIG 47.0 10/15/2020  ? TRIG 45.0 05/17/2018  ? ?Lab Results  ?Component Value Date  ? CHOLHDL 2 07/23/2021  ? C

## 2021-07-23 ENCOUNTER — Other Ambulatory Visit: Payer: Self-pay

## 2021-07-23 ENCOUNTER — Encounter: Payer: Self-pay | Admitting: Internal Medicine

## 2021-07-23 ENCOUNTER — Ambulatory Visit (INDEPENDENT_AMBULATORY_CARE_PROVIDER_SITE_OTHER): Payer: Medicare Other | Admitting: Internal Medicine

## 2021-07-23 DIAGNOSIS — E1159 Type 2 diabetes mellitus with other circulatory complications: Secondary | ICD-10-CM

## 2021-07-23 DIAGNOSIS — E538 Deficiency of other specified B group vitamins: Secondary | ICD-10-CM

## 2021-07-23 DIAGNOSIS — M5440 Lumbago with sciatica, unspecified side: Secondary | ICD-10-CM

## 2021-07-23 DIAGNOSIS — R400 Somnolence: Secondary | ICD-10-CM | POA: Diagnosis not present

## 2021-07-23 DIAGNOSIS — I2510 Atherosclerotic heart disease of native coronary artery without angina pectoris: Secondary | ICD-10-CM

## 2021-07-23 DIAGNOSIS — I951 Orthostatic hypotension: Secondary | ICD-10-CM | POA: Insufficient documentation

## 2021-07-23 DIAGNOSIS — G8929 Other chronic pain: Secondary | ICD-10-CM

## 2021-07-23 DIAGNOSIS — I959 Hypotension, unspecified: Secondary | ICD-10-CM

## 2021-07-23 LAB — COMPREHENSIVE METABOLIC PANEL
ALT: 13 U/L (ref 0–53)
AST: 16 U/L (ref 0–37)
Albumin: 4.4 g/dL (ref 3.5–5.2)
Alkaline Phosphatase: 108 U/L (ref 39–117)
BUN: 31 mg/dL — ABNORMAL HIGH (ref 6–23)
CO2: 26 mEq/L (ref 19–32)
Calcium: 9.5 mg/dL (ref 8.4–10.5)
Chloride: 104 mEq/L (ref 96–112)
Creatinine, Ser: 1.17 mg/dL (ref 0.40–1.50)
GFR: 63.05 mL/min (ref >60.00)
Glucose, Bld: 102 mg/dL — ABNORMAL HIGH (ref 70–99)
Potassium: 4.8 mEq/L (ref 3.5–5.1)
Sodium: 139 mEq/L (ref 135–145)
Total Bilirubin: 0.5 mg/dL (ref 0.2–1.2)
Total Protein: 6.7 g/dL (ref 6.0–8.3)

## 2021-07-23 LAB — LIPID PANEL
Cholesterol: 142 mg/dL (ref 0–200)
HDL: 67.9 mg/dL (ref >39.00)
LDL Cholesterol: 68 mg/dL (ref 0–99)
NonHDL: 74.13
Total CHOL/HDL Ratio: 2
Triglycerides: 29 mg/dL (ref 0.0–149.0)
VLDL: 5.8 mg/dL (ref 0.0–40.0)

## 2021-07-23 LAB — CBC WITH DIFFERENTIAL/PLATELET
Basophils Absolute: 0 10*3/uL (ref 0.0–0.1)
Basophils Relative: 0.3 % (ref 0.0–3.0)
Eosinophils Absolute: 0.1 10*3/uL (ref 0.0–0.7)
Eosinophils Relative: 1 % (ref 0.0–5.0)
HCT: 35.2 % — ABNORMAL LOW (ref 39.0–52.0)
Hemoglobin: 11.8 g/dL — ABNORMAL LOW (ref 13.0–17.0)
Lymphocytes Relative: 17.2 % (ref 12.0–46.0)
Lymphs Abs: 0.9 10*3/uL (ref 0.7–4.0)
MCHC: 33.6 g/dL (ref 30.0–36.0)
MCV: 94.1 fl (ref 78.0–100.0)
Monocytes Absolute: 0.5 10*3/uL (ref 0.1–1.0)
Monocytes Relative: 9.8 % (ref 3.0–12.0)
Neutro Abs: 3.9 10*3/uL (ref 1.4–7.7)
Neutrophils Relative %: 71.7 % (ref 43.0–77.0)
Platelets: 248 10*3/uL (ref 150.0–400.0)
RBC: 3.74 Mil/uL — ABNORMAL LOW (ref 4.22–5.81)
RDW: 14.1 % (ref 11.5–15.5)
WBC: 5.5 10*3/uL (ref 4.0–10.5)

## 2021-07-23 LAB — VITAMIN B12: Vitamin B-12: 663 pg/mL (ref 211–911)

## 2021-07-23 LAB — HEMOGLOBIN A1C: Hgb A1c MFr Bld: 6.3 % (ref 4.6–6.5)

## 2021-07-23 LAB — TSH: TSH: 1.82 u[IU]/mL (ref 0.35–5.50)

## 2021-07-23 LAB — T4, FREE: Free T4: 1.03 ng/dL (ref 0.60–1.60)

## 2021-07-23 MED ORDER — AMLODIPINE BESYLATE 5 MG PO TABS: 5.0000 mg | ORAL_TABLET | Freq: Every day | ORAL | 3 refills | Status: DC

## 2021-07-23 NOTE — Progress Notes (Signed)
? ?Subjective:  ?Patient ID: Jared Alvarez, male    DOB: [birth year]  Age: 71 y.o. MRN: [id number] ? ?CC: Follow-up and blood pressure concern (Concern that his  BP is running lower than normal ) ? ? ?HPI ?Jared Alvarez presents for low BP: SBP 70 at [facility] ?No LOC ?F/u CAD, DM, OA ? ?Outpatient Medications Prior to Visit  ?Medication Sig Dispense Refill  ? aspirin 81 MG tablet Take 1 tablet (81 mg total) by mouth daily. 30 tablet 2  ? cetirizine (ZYRTEC) 10 MG tablet Take 1 tablet (10 mg total) by mouth daily. 30 tablet 11  ? colchicine 0.6 MG tablet Take 1 tablet (0.6 mg total) by mouth 2 (two) times daily. If patient has flare up with gout 30 tablet 5  ? Cyanocobalamin (VITAMIN B-12) 500 MCG SUBL Place 1 tablet (500 mcg total) under the tongue daily. 150 tablet 2  ? Homeopathic Products (ARNICARE ARNICA) OINT Apply 1 application topically 2 (two) times daily as needed (arthritis).    ? ibuprofen (ADVIL) 200 MG tablet Ibuprofen cream PRN    ? IBUPROFEN-LINIMENT CO by Combination route.    ? LORazepam (ATIVAN) 0.5 MG tablet Take by mouth. Take 1/2 tab by mouth at bedtime    ? losartan (COZAAR) 100 MG tablet Take 1 tablet (100 mg total) by mouth daily. 90 tablet 3  ? nitroGLYCERIN (NITROSTAT) 0.4 MG SL tablet Place 1 tablet (0.4 mg total) under the tongue every 5 (five) minutes as needed for chest pain. 25 tablet 3  ? triamcinolone (NASACORT) 55 MCG/ACT AERO nasal inhaler Place 2 sprays into the nose daily. 1 each 12  ? triamcinolone cream (KENALOG) 0.1 % Apply 1 application topically 2 (two) times daily. 100 g 0  ? acetaminophen (TYLENOL) 500 MG tablet Take 1 tablet (500 mg total) by mouth every 6 (six) hours as needed. 30 tablet 2  ? doxazosin (CARDURA) 2 MG tablet Take 2 mg by mouth daily.    ? FLUoxetine (PROZAC) 40 MG capsule TAKE ONE CAPSULE BY MOUTH DAILY 90 capsule 1  ? metFORMIN (GLUCOPHAGE) 1000 MG tablet TAKE ONE TABLET BY MOUTH TWICE A DAY WITH A MEAL 180 tablet 1  ? pantoprazole (PROTONIX) 40 MG  tablet TAKE ONE TABLET BY MOUTH TWICE A DAY 180 tablet 1  ? rosuvastatin (CRESTOR) 20 MG tablet Take 20 mg by mouth 3 (three) times a week. Mon, Wed, and Fri    ? amLODipine (NORVASC) 10 MG tablet Take 1 tablet (10 mg total) by mouth daily. 90 tablet 3  ? tadalafil (CIALIS) 20 MG tablet Take 1 tablet (20 mg total) by mouth every other day as needed for erectile dysfunction. 10 tablet 5  ? ?Facility-Administered Medications Prior to Visit  ?Medication Dose Route Frequency Provider Last Rate Last Admin  ? 0.9 %  sodium chloride infusion  500 mL Intravenous Once Irene Shipper, MD      ? ? ?ROS: ?Review of Systems  ?Constitutional:  Positive for fatigue. Negative for appetite change and unexpected weight change.  ?HENT:  Negative for congestion, nosebleeds, sneezing, sore throat and trouble swallowing.   ?Eyes:  Negative for itching and visual disturbance.  ?Respiratory:  Negative for cough.   ?Cardiovascular:  Negative for chest pain, palpitations and leg swelling.  ?Gastrointestinal:  Negative for abdominal distention, blood in stool, diarrhea and nausea.  ?Genitourinary:  Negative for frequency and hematuria.  ?Musculoskeletal:  Positive for arthralgias and back pain. Negative for gait problem, joint swelling and  neck pain.  ?Skin:  Negative for rash.  ?Neurological:  Negative for dizziness, tremors, speech difficulty and weakness.  ?Psychiatric/Behavioral:  Negative for agitation, dysphoric mood and sleep disturbance. The patient is not nervous/anxious.   ? ?Objective:  ?BP 118/62   Pulse (!) 53   Temp 97.6 ?F (36.4 ?C) (Oral)   Ht '5\' 8"'$  (1.727 m)   Wt 173 lb 8 oz (78.7 kg)   SpO2 96%   BMI 26.38 kg/m?  ? ?BP Readings from Last 3 Encounters:  ?07/25/21 (!) 120/52  ?07/23/21 118/62  ?05/13/21 116/71  ? ? ?Wt Readings from Last 3 Encounters:  ?07/25/21 173 lb (78.5 kg)  ?07/23/21 173 lb 8 oz (78.7 kg)  ?05/13/21 173 lb (78.5 kg)  ? ? ?Physical Exam ?Constitutional:   ?   General: He is not in acute distress. ?    Appearance: Normal appearance. He is well-developed. He is not diaphoretic.  ?   Comments: NAD  ?HENT:  ?   Head: Normocephalic and atraumatic.  ?   Right Ear: External ear normal.  ?   Left Ear: External ear normal.  ?   Nose: Nose normal.  ?   Mouth/Throat:  ?   Pharynx: No oropharyngeal exudate.  ?Eyes:  ?   General: No scleral icterus.    ?   Right eye: No discharge.     ?   Left eye: No discharge.  ?   Conjunctiva/sclera: Conjunctivae normal.  ?   Pupils: Pupils are equal, round, and reactive to light.  ?Neck:  ?   Thyroid: No thyromegaly.  ?   Vascular: No JVD.  ?   Trachea: No tracheal deviation.  ?Cardiovascular:  ?   Rate and Rhythm: Normal rate and regular rhythm.  ?   Heart sounds: Normal heart sounds. No murmur heard. ?  No friction rub. No gallop.  ?Pulmonary:  ?   Effort: Pulmonary effort is normal. No respiratory distress.  ?   Breath sounds: Normal breath sounds. No stridor. No wheezing or rales.  ?Chest:  ?   Chest wall: No tenderness.  ?Abdominal:  ?   General: Bowel sounds are normal. There is no distension.  ?   Palpations: Abdomen is soft. There is no mass.  ?   Tenderness: There is no abdominal tenderness. There is no guarding or rebound.  ?Genitourinary: ?   Penis: Normal. No tenderness.   ?   Prostate: Normal.  ?   Rectum: Normal. Guaiac result negative.  ?Musculoskeletal:     ?   General: No tenderness. Normal range of motion.  ?   Cervical back: Normal range of motion and neck supple.  ?Lymphadenopathy:  ?   Cervical: No cervical adenopathy.  ?Skin: ?   General: Skin is warm and dry.  ?   Coloration: Skin is not pale.  ?   Findings: No erythema or rash.  ?Neurological:  ?   Mental Status: He is alert and oriented to person, place, and time.  ?   Cranial Nerves: No cranial nerve deficit.  ?   Motor: No abnormal muscle tone.  ?   Coordination: Coordination normal.  ?   Gait: Gait normal.  ?   Deep Tendon Reflexes: Reflexes are normal and symmetric. Reflexes normal.  ?Psychiatric:     ?    Behavior: Behavior normal.     ?   Thought Content: Thought content normal.     ?   Judgment: Judgment normal.  ? ? ?Lab  Results  ?Component Value Date  ? WBC 5.5 07/23/2021  ? HGB 11.8 (L) 07/23/2021  ? HCT 35.2 (L) 07/23/2021  ? PLT 248.0 07/23/2021  ? GLUCOSE 102 (H) 07/23/2021  ? CHOL 142 07/23/2021  ? TRIG 29.0 07/23/2021  ? HDL 67.90 07/23/2021  ? LDLDIRECT 149.6 12/15/2011  ? [place] 68 07/23/2021  ? ALT 13 07/23/2021  ? AST 16 07/23/2021  ? NA 139 07/23/2021  ? K 4.8 07/23/2021  ? CL 104 07/23/2021  ? CREATININE 1.17 07/23/2021  ? BUN 31 (H) 07/23/2021  ? CO2 26 07/23/2021  ? TSH 1.82 07/23/2021  ? PSA 3.87 01/30/2021  ? INR 0.98 06/03/2010  ? HGBA1C 6.3 07/23/2021  ? MICROALBUR 1.3 10/15/2020  ? ? ?DG Knee Complete 4 Views Left ? ?Result Date: 02/17/2018 ?CLINICAL DATA:  Acute LEFT knee pain, fell recently while walking the dog EXAM: LEFT KNEE - COMPLETE 4+ VIEW COMPARISON:  None FINDINGS: Osseous mineralization normal. Mild medial compartment joint space narrowing. Scattered chondrocalcinosis question CPPD. No acute fracture, dislocation, or bone destruction. Question minimal calcific debris at suprapatellar recess. No knee joint effusion. IMPRESSION: Minimal degenerative changes and suspected CPPD of the LEFT knee with question minimal calcific debris at the suprapatellar recess. No acute abnormalities. Electronically Signed   By: Lavonia Dana M.D.   On: 02/17/2018 16:35  ? ? ?Assessment & Plan:  ? ?Problem List Items Addressed This Visit   ? ? DM type 2 causing vascular disease ([place])  ?  Chronic  ?On Metformin ?Check CBG at home prn - Onetouch Verio ?  ?  ? Relevant Medications  ? metFORMIN (GLUCOPHAGE) 1000 MG tablet  ? doxazosin (CARDURA) 2 MG tablet  ? tadalafil (CIALIS) 20 MG tablet  ? rosuvastatin (CRESTOR) 20 MG tablet  ? Other Relevant Orders  ? Hemoglobin A1c (Completed)  ? B12 deficiency  ?  On Vit B12 ?  ?  ? Coronary atherosclerosis  ?  On ASA, Crestor ?  ?  ? Relevant Medications  ? doxazosin  (CARDURA) 2 MG tablet  ? tadalafil (CIALIS) 20 MG tablet  ? rosuvastatin (CRESTOR) 20 MG tablet  ? Other Relevant Orders  ? Lipid panel (Completed)  ? LOW BACK PAIN  ?  MSK/OA ?Tramadol prn ? Potential benefits of

## 2021-07-23 NOTE — Assessment & Plan Note (Signed)
Chronic  ?On Metformin ?Check CBG at home prn - Onetouch Verio ?

## 2021-07-23 NOTE — Assessment & Plan Note (Signed)
Low BPs ?Reduce BP meds ?

## 2021-07-23 NOTE — Assessment & Plan Note (Signed)
MSK/OA ?Tramadol prn ? Potential benefits of a long term tramadol use as well as potential risks (i.e. addiction risk, apnea etc) and complications (i.e. Somnolence, constipation and others) were explained to the patient and were aknowledged. ?The Original [place] Self Inflating Airback Lumbar Support ? ?

## 2021-07-23 NOTE — Patient Instructions (Signed)
Reduce Amlodipine to 5 mg/day ?

## 2021-07-23 NOTE — Assessment & Plan Note (Addendum)
Recent low BPs: SBP 70 at [facility] ?Reduce Amlodipine to 5 mg/d ?

## 2021-07-23 NOTE — Assessment & Plan Note (Signed)
On ASA, Crestor 

## 2021-07-23 NOTE — Assessment & Plan Note (Signed)
On Vit B12 

## 2021-07-25 ENCOUNTER — Ambulatory Visit (INDEPENDENT_AMBULATORY_CARE_PROVIDER_SITE_OTHER): Payer: Medicare Other | Admitting: Cardiovascular Disease

## 2021-07-25 ENCOUNTER — Other Ambulatory Visit: Payer: Self-pay

## 2021-07-25 ENCOUNTER — Encounter: Payer: Self-pay | Admitting: Cardiovascular Disease

## 2021-07-25 VITALS — BP 120/52 | HR 54 | Ht 68.0 in | Wt 173.0 lb

## 2021-07-25 DIAGNOSIS — I959 Hypotension, unspecified: Secondary | ICD-10-CM | POA: Diagnosis not present

## 2021-07-25 DIAGNOSIS — I2510 Atherosclerotic heart disease of native coronary artery without angina pectoris: Secondary | ICD-10-CM | POA: Diagnosis not present

## 2021-07-25 DIAGNOSIS — I712 Thoracic aortic aneurysm, without rupture, unspecified: Secondary | ICD-10-CM

## 2021-07-25 DIAGNOSIS — I10 Essential (primary) hypertension: Secondary | ICD-10-CM | POA: Diagnosis not present

## 2021-07-25 MED ORDER — ROSUVASTATIN CALCIUM 20 MG PO TABS: 20.0000 mg | ORAL_TABLET | ORAL | 1 refills | Status: DC

## 2021-07-25 MED ORDER — PANTOPRAZOLE SODIUM 40 MG PO TBEC: 40.0000 mg | DELAYED_RELEASE_TABLET | Freq: Two times a day (BID) | ORAL | 3 refills | Status: DC

## 2021-07-25 MED ORDER — DOXAZOSIN MESYLATE 2 MG PO TABS: 2.0000 mg | ORAL_TABLET | Freq: Every day | ORAL | 3 refills | Status: DC

## 2021-07-25 MED ORDER — METFORMIN HCL 1000 MG PO TABS: ORAL_TABLET | ORAL | 3 refills | Status: DC

## 2021-07-25 MED ORDER — FLUOXETINE HCL 40 MG PO CAPS: 40.0000 mg | ORAL_CAPSULE | Freq: Every day | ORAL | 3 refills | Status: DC

## 2021-07-25 MED ORDER — ACETAMINOPHEN 500 MG PO TABS: 500.0000 mg | ORAL_TABLET | Freq: Four times a day (QID) | ORAL | 2 refills | Status: AC | PRN

## 2021-07-25 MED ORDER — TADALAFIL 20 MG PO TABS
20.0000 mg | ORAL_TABLET | ORAL | 5 refills | Status: DC | PRN
Start: 2021-07-25 — End: 2021-10-07

## 2021-07-25 NOTE — Patient Instructions (Signed)
Medication Instructions:  ?STOP AMLODIPINE ?*If you need a refill on your cardiac medications before your next appointment, please call your pharmacy* ? ? ?Lab Work: ? ?If you have labs (blood work) drawn today and your tests are completely normal, you will receive your results only by: ?MyChart Message (if you have MyChart) OR ?A paper copy in the mail ?If you have any lab test that is abnormal or we need to change your treatment, we will call you to review the results. ? ? ?Testing/Procedures: ?NONE ? ? ?Follow-Up: ?At [facility], you and your health needs are our priority.  As part of our continuing mission to provide you with exceptional heart care, we have created designated Provider Care Teams.  These Care Teams include your primary Cardiologist (physician) and Advanced Practice Providers (APPs -  Physician Assistants and Nurse Practitioners) who all work together to provide you with the care you need, when you need it. ? ?We recommend signing up for the patient portal called "MyChart".  Sign up information is provided on this After Visit Summary.  MyChart is used to connect with patients for Virtual Visits (Telemedicine).  Patients are able to view lab/test results, encounter notes, upcoming appointments, etc.  Non-urgent messages can be sent to your provider as well.   ?To learn more about what you can do with MyChart, go to [email or web address].   ? ?Your next appointment:   ?1 year(s) ? ?The format for your next appointment:   ?In Person ? ?Provider:   ?Jenkins Rouge, MD   ? ? ?Other Instructions ?NONE  ?

## 2021-07-28 ENCOUNTER — Ambulatory Visit (INDEPENDENT_AMBULATORY_CARE_PROVIDER_SITE_OTHER): Payer: Medicare Other | Admitting: Psychology

## 2021-07-28 DIAGNOSIS — F4323 Adjustment disorder with mixed anxiety and depressed mood: Secondary | ICD-10-CM | POA: Diagnosis not present

## 2021-07-28 NOTE — Progress Notes (Signed)
? ? ? ? ?  07/28/2021  ?Diagnosis ?unspecified ?Symptoms ?unspecified ?Medication Status ?compliance  ?Safety ?none  ?If Suicidal or Homicidal State Action Taken: unspecified  ?Current Risk: low ?Medications ?unspecified ?Objectives ?unspecified ?Client Response ?full compliance  ?Service Location ?Location, [address] [place], [place] [address]  ?Service Code ?cpt [id number]  ?Related past to present  ?Identified an insight  ?Develop/support self-efficacy  ?Rationally challenge thoughts or beliefs/cognitive restructuring  ?Identify/label emotions  ?Validate/empathize  ?Distress tolerance skill  ?Self care activities  ? ?Lifestyle change (exercise, nutrition)  ?Self-monitoring  ?Comments  ?Dx.: F41.1, F34.1  ?Meds.: Fluoxetine ('40mg'$ .), metformin, Losartan, Amlodipine, Famotidine  ?Goals: Wants to address depleted self-esteem and confidence. Additionally is seeking to better understand marital relationship, relationship expectations and how (or if) it is compatible with his life goals. Goal date 12-23  ?Patient agrees to having a video session due to the Coronavirus. He is at home and I am at my home office.  ?Jared Alvarez says he wants to talk about a dream he had last night. As he was going to sleep, he had a "sense of dread". When he fell asleep, his wife says he son woke up with a scream of "Dad!". He says he has no memory of this dream. He says that when his dad died, he was with second wife in [place] on vacation. He had a sense of dread that morning and later in the day, he got a call that his father had died (at the same time he was having those strange feelings). It is similar to how he felt last night. His father was very tough and not very nice at times. He feared his father. Was frequently told "wait til your father gets home". In some ways, he feels he has some of the characteristics of his father in that he will yell and be easily upset. We discussed why he might be having these memories/dreams right now. Says  he is under stress, much of which is related to his and his wife's aging. ?I suggested he start to record his dreams for [place] to discuss. Also, he asked me to check to see if EMDR is an option to explore possible trauma.           ?  ? ? ?Marcelina Morel, PhD Time: 10:40p-11:30p 50 minutes. ?

## 2021-07-30 ENCOUNTER — Encounter: Payer: Self-pay | Admitting: Internal Medicine

## 2021-08-11 ENCOUNTER — Ambulatory Visit (INDEPENDENT_AMBULATORY_CARE_PROVIDER_SITE_OTHER): Payer: Medicare Other | Admitting: Psychology

## 2021-08-11 DIAGNOSIS — F4323 Adjustment disorder with mixed anxiety and depressed mood: Secondary | ICD-10-CM | POA: Diagnosis not present
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
## 2021-08-25 ENCOUNTER — Ambulatory Visit: Payer: Medicare Other | Admitting: Psychology

## 2021-09-08 ENCOUNTER — Ambulatory Visit: Payer: Medicare Other | Admitting: Psychology

## 2021-09-16 ENCOUNTER — Ambulatory Visit (INDEPENDENT_AMBULATORY_CARE_PROVIDER_SITE_OTHER): Payer: Medicare Other | Admitting: Psychology

## 2021-09-16 DIAGNOSIS — F4323 Adjustment disorder with mixed anxiety and depressed mood: Secondary | ICD-10-CM

## 2021-09-16 DIAGNOSIS — F4310 Post-traumatic stress disorder, unspecified: Secondary | ICD-10-CM | POA: Diagnosis not present
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
## 2021-09-22 ENCOUNTER — Ambulatory Visit: Payer: Medicare Other | Admitting: Psychology

## 2021-09-23 ENCOUNTER — Ambulatory Visit: Payer: Medicare Other | Admitting: Psychology

## 2021-09-30 ENCOUNTER — Ambulatory Visit (INDEPENDENT_AMBULATORY_CARE_PROVIDER_SITE_OTHER): Payer: Medicare Other | Admitting: Psychology

## 2021-09-30 DIAGNOSIS — F4310 Post-traumatic stress disorder, unspecified: Secondary | ICD-10-CM | POA: Diagnosis not present

## 2021-09-30 DIAGNOSIS — F4323 Adjustment disorder with mixed anxiety and depressed mood: Secondary | ICD-10-CM | POA: Diagnosis not present
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
## 2021-10-06 ENCOUNTER — Ambulatory Visit: Payer: Medicare Other | Admitting: Psychology

## 2021-10-07 ENCOUNTER — Ambulatory Visit (INDEPENDENT_AMBULATORY_CARE_PROVIDER_SITE_OTHER): Payer: Medicare Other | Admitting: Internal Medicine

## 2021-10-07 ENCOUNTER — Encounter: Payer: Self-pay | Admitting: Internal Medicine

## 2021-10-07 DIAGNOSIS — F419 Anxiety disorder, unspecified: Secondary | ICD-10-CM | POA: Diagnosis not present

## 2021-10-07 DIAGNOSIS — I2510 Atherosclerotic heart disease of native coronary artery without angina pectoris: Secondary | ICD-10-CM | POA: Diagnosis not present

## 2021-10-07 DIAGNOSIS — L570 Actinic keratosis: Secondary | ICD-10-CM

## 2021-10-07 DIAGNOSIS — R361 Hematospermia: Secondary | ICD-10-CM | POA: Diagnosis not present

## 2021-10-07 DIAGNOSIS — M1990 Unspecified osteoarthritis, unspecified site: Secondary | ICD-10-CM

## 2021-10-07 DIAGNOSIS — Z23 Encounter for immunization: Secondary | ICD-10-CM | POA: Diagnosis not present

## 2021-10-07 MED ORDER — TADALAFIL 20 MG PO TABS
20.0000 mg | ORAL_TABLET | ORAL | 3 refills | Status: DC | PRN
Start: 2021-10-07 — End: 2022-02-26

## 2021-10-07 MED ORDER — PANTOPRAZOLE SODIUM 40 MG PO TBEC: 40.0000 mg | DELAYED_RELEASE_TABLET | Freq: Two times a day (BID) | ORAL | 3 refills | Status: DC

## 2021-10-07 MED ORDER — LORAZEPAM 0.5 MG PO TABS
0.5000 mg | ORAL_TABLET | Freq: Every evening | ORAL | 0 refills | Status: DC | PRN
Start: 2021-10-07 — End: 2022-01-29

## 2021-10-07 NOTE — Assessment & Plan Note (Signed)
See procedure -  trunk, arm L  x3

## 2021-10-07 NOTE — Assessment & Plan Note (Signed)
Cont w/Fluoxetine, Lorazepam at hs prn

## 2021-10-07 NOTE — Progress Notes (Signed)
Subjective:  Patient ID: Jared Alvarez, male    DOB: 1950/06/20  Age: 71 y.o. MRN: [id number]  CC: Follow-up and semen concerns (Has seen discoloration, looks brown)   HPI Jared Alvarez presents for anxiety, ED, insomnia C/o moles  Outpatient Medications Prior to Visit  Medication Sig Dispense Refill   acetaminophen (TYLENOL) 500 MG tablet Take 1 tablet (500 mg total) by mouth every 6 (six) hours as needed. 30 tablet 2   aspirin 81 MG tablet Take 1 tablet (81 mg total) by mouth daily. 30 tablet 2   cetirizine (ZYRTEC) 10 MG tablet Take 1 tablet (10 mg total) by mouth daily. 30 tablet 11   colchicine 0.6 MG tablet Take 1 tablet (0.6 mg total) by mouth 2 (two) times daily. If patient has flare up with gout 30 tablet 5   Cyanocobalamin (VITAMIN B-12) 500 MCG SUBL Place 1 tablet (500 mcg total) under the tongue daily. 150 tablet 2   doxazosin (CARDURA) 2 MG tablet Take 1 tablet (2 mg total) by mouth daily. 90 tablet 3   FLUoxetine (PROZAC) 40 MG capsule Take 1 capsule (40 mg total) by mouth daily. 90 capsule 3   Homeopathic Products (ARNICARE ARNICA) OINT Apply 1 application topically 2 (two) times daily as needed (arthritis).     ibuprofen (ADVIL) 200 MG tablet Ibuprofen cream PRN     IBUPROFEN-LINIMENT CO by Combination route.     losartan (COZAAR) 100 MG tablet Take 1 tablet (100 mg total) by mouth daily. 90 tablet 3   metFORMIN (GLUCOPHAGE) 1000 MG tablet TAKE ONE TABLET BY MOUTH TWICE A DAY WITH A MEAL 180 tablet 3   nitroGLYCERIN (NITROSTAT) 0.4 MG SL tablet Place 1 tablet (0.4 mg total) under the tongue every 5 (five) minutes as needed for chest pain. 25 tablet 3   rosuvastatin (CRESTOR) 20 MG tablet Take 1 tablet (20 mg total) by mouth 3 (three) times a week. Mon, Wed, and Fri 90 tablet 1   triamcinolone (NASACORT) 55 MCG/ACT AERO nasal inhaler Place 2 sprays into the nose daily. 1 each 12   triamcinolone cream (KENALOG) 0.1 % Apply 1 application topically 2 (two) times daily.  100 g 0   LORazepam (ATIVAN) 0.5 MG tablet Take by mouth. Take 1/2 tab by mouth at bedtime     pantoprazole (PROTONIX) 40 MG tablet Take 1 tablet (40 mg total) by mouth 2 (two) times daily. 180 tablet 3   amLODipine (NORVASC) 10 MG tablet Take 10 mg by mouth daily. (Patient not taking: Reported on 10/07/2021)     tadalafil (CIALIS) 20 MG tablet Take 1 tablet (20 mg total) by mouth every other day as needed for erectile dysfunction. 10 tablet 5   Facility-Administered Medications Prior to Visit  Medication Dose Route Frequency Provider Last Rate Last Admin   0.9 %  sodium chloride infusion  500 mL Intravenous Once Irene Shipper, MD        ROS: Review of Systems  Constitutional:  Negative for appetite change, fatigue and unexpected weight change.  HENT:  Negative for congestion, nosebleeds, sneezing, sore throat and trouble swallowing.   Eyes:  Negative for itching and visual disturbance.  Respiratory:  Negative for cough.   Cardiovascular:  Negative for chest pain, palpitations and leg swelling.  Gastrointestinal:  Negative for abdominal distention, blood in stool, diarrhea and nausea.  Genitourinary:  Negative for frequency and hematuria.  Musculoskeletal:  Positive for arthralgias. Negative for back pain, gait problem, joint swelling and  neck pain.  Skin:  Negative for rash.  Neurological:  Negative for dizziness, tremors, speech difficulty and weakness.  Psychiatric/Behavioral:  Negative for agitation, dysphoric mood, sleep disturbance and suicidal ideas. The patient is not nervous/anxious.    Objective:  BP 118/66 (BP Location: Left Arm, Patient Position: Sitting, Cuff Size: Large)   Pulse (!) 53   Temp 97.8 F (36.6 C) (Oral)   Ht '5\' 8"'$  (1.727 m)   Wt 172 lb (78 kg)   SpO2 98%   BMI 26.15 kg/m   BP Readings from Last 3 Encounters:  10/07/21 118/66  07/25/21 (!) 120/52  07/23/21 118/62    Wt Readings from Last 3 Encounters:  10/07/21 172 lb (78 kg)  07/25/21 173 lb (78.5  kg)  07/23/21 173 lb 8 oz (78.7 kg)    Physical Exam Constitutional:      General: He is not in acute distress.    Appearance: He is well-developed.     Comments: NAD  Eyes:     Conjunctiva/sclera: Conjunctivae normal.     Pupils: Pupils are equal, round, and reactive to light.  Neck:     Thyroid: No thyromegaly.     Vascular: No JVD.  Cardiovascular:     Rate and Rhythm: Normal rate and regular rhythm.     Heart sounds: Normal heart sounds. No murmur heard.   No friction rub. No gallop.  Pulmonary:     Effort: Pulmonary effort is normal. No respiratory distress.     Breath sounds: Normal breath sounds. No wheezing or rales.  Chest:     Chest wall: No tenderness.  Abdominal:     General: Bowel sounds are normal. There is no distension.     Palpations: Abdomen is soft. There is no mass.     Tenderness: There is no abdominal tenderness. There is no guarding or rebound.  Musculoskeletal:        General: No tenderness. Normal range of motion.     Cervical back: Normal range of motion.  Lymphadenopathy:     Cervical: No cervical adenopathy.  Skin:    General: Skin is warm and dry.     Findings: No rash.  Neurological:     Mental Status: He is alert and oriented to person, place, and time.     Cranial Nerves: No cranial nerve deficit.     Motor: No abnormal muscle tone.     Coordination: Coordination normal.     Gait: Gait normal.     Deep Tendon Reflexes: Reflexes are normal and symmetric.  Psychiatric:        Behavior: Behavior normal.        Thought Content: Thought content normal.        Judgment: Judgment normal.   AK's on skin   Procedure Note :     Procedure : Cryosurgery   Indication:   Actinic keratosis(es) -  trunk, arm L   Risks including unsuccessful procedure , bleeding, infection, bruising, scar, a need for a repeat  procedure and others were explained to the patient in detail as well as the benefits. Informed consent was obtained verbally.    1  lesion(s)  on back   was/were treated with liquid nitrogen on a Q-tip in a usual fasion . Band-Aid was applied and antibiotic ointment was given for a later use.   Tolerated well. Complications none.   Postprocedure instructions :     Keep the wounds clean. You can wash them with liquid soap and  water. Pat dry with gauze or a Kleenex tissue  Before applying antibiotic ointment and a Band-Aid.   You need to report immediately  if  any signs of infection develop.   Lab Results  Component Value Date   WBC 5.5 07/23/2021   HGB 11.8 (L) 07/23/2021   HCT 35.2 (L) 07/23/2021   PLT 248.0 07/23/2021   GLUCOSE 102 (H) 07/23/2021   CHOL 142 07/23/2021   TRIG 29.0 07/23/2021   HDL 67.90 07/23/2021   LDLDIRECT 149.6 12/15/2011   LDLCALC 68 07/23/2021   ALT 13 07/23/2021   AST 16 07/23/2021   NA 139 07/23/2021   K 4.8 07/23/2021   CL 104 07/23/2021   CREATININE 1.17 07/23/2021   BUN 31 (H) 07/23/2021   CO2 26 07/23/2021   TSH 1.82 07/23/2021   PSA 3.87 01/30/2021   INR 0.98 06/03/2010   HGBA1C 6.3 07/23/2021   MICROALBUR 1.3 10/15/2020    DG Knee Complete 4 Views Left  Result Date: 02/17/2018 CLINICAL DATA:  Acute LEFT knee pain, fell recently while walking the dog EXAM: LEFT KNEE - COMPLETE 4+ VIEW COMPARISON:  None FINDINGS: Osseous mineralization normal. Mild medial compartment joint space narrowing. Scattered chondrocalcinosis question CPPD. No acute fracture, dislocation, or bone destruction. Question minimal calcific debris at suprapatellar recess. No knee joint effusion. IMPRESSION: Minimal degenerative changes and suspected CPPD of the LEFT knee with question minimal calcific debris at the suprapatellar recess. No acute abnormalities. Electronically Signed   By: Lavonia Dana M.D.   On: 02/17/2018 16:35    Assessment & Plan:   Problem List Items Addressed This Visit     Actinic keratoses    See procedure -  trunk, arm L  x3       Anxiety disorder    Cont w/Fluoxetine,  Lorazepam at hs prn      Relevant Medications   LORazepam (ATIVAN) 0.5 MG tablet   Hematospermia    Discussed. Urology ref was offered       Osteoarthritis    Blue-Emu cream -- use 2-3 times a day         Meds ordered this encounter  Medications   tadalafil (CIALIS) 20 MG tablet    Sig: Take 1 tablet (20 mg total) by mouth every other day as needed for erectile dysfunction.    Dispense:  45 tablet    Refill:  3   LORazepam (ATIVAN) 0.5 MG tablet    Sig: Take 1 tablet (0.5 mg total) by mouth at bedtime as needed for anxiety. Take 1/2 tab by mouth at bedtime    Dispense:  90 tablet    Refill:  0   pantoprazole (PROTONIX) 40 MG tablet    Sig: Take 1 tablet (40 mg total) by mouth 2 (two) times daily.    Dispense:  180 tablet    Refill:  3      Follow-up: Return in about 3 months (around 01/07/2022) for a follow-up visit.  Walker Kehr, MD

## 2021-10-07 NOTE — Assessment & Plan Note (Signed)
Discussed. Urology ref was offered

## 2021-10-07 NOTE — Patient Instructions (Signed)
Blue-Emu cream -- use 2-3 times a day ? ?

## 2021-10-07 NOTE — Assessment & Plan Note (Signed)
Blue-Emu cream -- use 2-3 times a day ? ?

## 2021-10-08 ENCOUNTER — Ambulatory Visit (INDEPENDENT_AMBULATORY_CARE_PROVIDER_SITE_OTHER): Payer: Medicare Other | Admitting: Psychology

## 2021-10-08 DIAGNOSIS — F4323 Adjustment disorder with mixed anxiety and depressed mood: Secondary | ICD-10-CM | POA: Diagnosis not present

## 2021-10-08 DIAGNOSIS — F4310 Post-traumatic stress disorder, unspecified: Secondary | ICD-10-CM | POA: Diagnosis not present

## 2021-10-08 NOTE — Progress Notes (Signed)
[facility] Counselor/Therapist Progress Note  Patient ID: Jared Alvarez, MRN: [id number],    Date: 10/08/2021  Time Spent: 60 minutes  Treatment Type: Individual Therapy  Reported Symptoms: The patient attended a face-to-face individual therapy session in the office today.  The patient reports having feelings of insecurity and depression.  Mental Status Exam: Appearance:  Casual     Behavior: Appropriate  Motor: Normal  Speech/Language:  Normal Rate  Affect: Blunt  Mood: depressed  Thought process: normal  Thought content:   WNL  Sensory/Perceptual disturbances:   WNL  Orientation: oriented to person, place, time/date, and situation  Attention: Good  Concentration: Good  Memory: WNL  Fund of knowledge:  Good  Insight:   Good  Judgment:  Good  Impulse Control: Good   Risk Assessment: Danger to Self:  No Self-injurious Behavior: No Danger to Others: No Duty to Warn:no Physical Aggression / Violence:No  Access to Firearms a concern: No  Gang Involvement:No   Subjective: The patient attended a face-to-face individual therapy session in the office today.  The patient presents with a blunted affect and mood is pleasant.  The patient talked about some interactions he had today that made him feel good and secure with himself.  The patient tends to being more secure at a work setting as opposed to a personal setting.  We talked a little more about doing EMDR and some of the things we might want to target.  The patient also talked some about his wife today and essentially he has married someone critical like his father.  The patient has an issue with his own security about decisions and he reports that he does not know how to tell someone what he wants.  We talked about the goal of therapy being for him to learn how to speak what he wants and make decisions that he is comfortable with.  During the next session we will talk a little bit more about how to do the process  of EMDR and we will come up with the first cognitions to work on.  Interventions: Cognitive Behavioral Therapy, Assertiveness/Communication, and Insight-Oriented  Diagnosis:PTSD (post-traumatic stress disorder)  Adjustment disorder with mixed anxiety and depressed mood  Plan: Client Abilities/Strengths  Insightful, motivated, supportive husband  Client Treatment Preferences  Outpatient Individual therapy/EMDR  Client Statement of Needs  "I am having nightmares and my therapist wondered if EMDR would be helpful"  Treatment Level  Outpatient Individual therapy  Symptoms  Demonstrates an exaggerated startle response.: No Description Entered (Status: improved). Depressed  or irritable mood.:  Displays a  significant decline in interest and engagement in activities.: (Status: maintained). Displays significant psychological and/or physiological distress resulting from internal and external  clues that are reminiscent of the traumatic event.:(Status: maintained).  Experiences disturbances in sleep.: No Description Entered (Status: maintained). Experiences disturbing  and persistent thoughts, images, and/or perceptions of the traumatic event.:  (Status:maintained). Experiences frequent nightmares.: No Description Entered (Status: improved).  Feelings of hopelessness, worthlessness, or inappropriate guilt.: No Description Entered (Status:  improved). Has been exposed to a traumatic event involving actual or perceived threat of death or  serious injury.: No Description Entered (Status: maintained). Impairment in social, occupational, or  other areas of functioning.: . Intentionally avoids activities,  places, people, or objects (e.g., up-armored vehicles) that evoke memories of the event.: (Status: improved). Reports difficulty concentrating as  well as feelings of guilt.:  Problems Addressed  Goals 1. Develop healthy thinking patterns and beliefs about self, others,  and the world that lead  to the alleviation and help prevent the relapse of  depression. Objective Identify and replace thoughts and beliefs that support depression. Target Date:09/17/2022 Frequency: Weekly Progress: 0 Modality: individual Related Interventions 1. Explore and restructure underlying assumptions and beliefs reflected in biased self-talk that  may put the client at risk for relapse or recurrence. 2. Conduct Cognitive-Behavioral Therapy (see Cognitive Behavior Therapy by Olevia Bowens; Overcoming Depression by Lynita Lombard al.), beginning with helping the client learn the connection among  cognition, depressive feelings, and actions. 2. Eliminate or reduce the negative impact trauma related symptoms have  on social, occupational, and family functioning. Objective Learn and implement personal skills to manage challenging situations related to trauma. Target Date: 09/17/2022 Frequency: Weekly Progress: 0 Modality: individual 3. No longer avoids persons, places, activities, and objects that are  reminiscent of the traumatic event. Objective Participate in Eye Movement Desensitization and Reprocessing (EMDR) to reduce emotional distress  related to traumatic thoughts, feelings, and images. Target Date: 09/17/2022 Frequency: Weekly Progress: 0 Modality: individual   Related Interventions 1. Utilize Eye Movement Desensitization and Reprocessing (EMDR) to reduce the client's  emotional reactivity to the traumatic event and reduce PTSD symptoms. Objective Learn and implement guided self-dialogue to manage thoughts, feelings, and urges brought on by  encounters with trauma-related situations. Target Date: 05/16/2024Frequency: Weekly Progress: 0 Modality: individual Related Interventions 1. Teach the client a guided self-dialogue procedure in which he/she learns to recognize  maladaptive self-talk, challenges its biases, copes with engendered feelings, overcomes  avoidance, and reinforces his/her  accomplishments; review and reinforce progress, problemsolve obstacles. 4. No longer experiences intrusive event recollections, avoidance of event  reminders, intense arousal, or disinterest in activities or  relationships. 5. Thinks about or openly discusses the traumatic event with others  without experiencing psychological or physiological distress. Diagnosis:  Adjustment Disorder with mixed anxiety and depression Conditions For Discharge Achievement of treatment goals and objectives  Jared Alvarez G Jared Marquis, LCSW                  Jared Alvarez G Jared Pompa, LCSW               Jared Alvarez G Jared Penley, LCSW

## 2021-10-15 ENCOUNTER — Ambulatory Visit (INDEPENDENT_AMBULATORY_CARE_PROVIDER_SITE_OTHER): Payer: Medicare Other | Admitting: Psychology

## 2021-10-15 DIAGNOSIS — F4310 Post-traumatic stress disorder, unspecified: Secondary | ICD-10-CM

## 2021-10-15 DIAGNOSIS — F4323 Adjustment disorder with mixed anxiety and depressed mood: Secondary | ICD-10-CM

## 2021-10-16 NOTE — Progress Notes (Signed)
[facility] Counselor/Therapist Progress Note  Patient ID: Jared Alvarez, MRN: [id number],    Date: 10/15/2021  Time Spent: 60 minutes  Treatment Type: Individual Therapy  Reported Symptoms: The patient attended a face-to-face individual therapy session in the office today.  The patient reports having feelings of insecurity and depression.  Mental Status Exam: Appearance:  Casual     Behavior: Appropriate  Motor: Normal  Speech/Language:  Normal Rate  Affect: Blunt  Mood: depressed  Thought process: normal  Thought content:   WNL  Sensory/Perceptual disturbances:   WNL  Orientation: oriented to person, place, time/date, and situation  Attention: Good  Concentration: Good  Memory: WNL  Fund of knowledge:  Good  Insight:   Good  Judgment:  Good  Impulse Control: Good   Risk Assessment: Danger to Self:  No Self-injurious Behavior: No Danger to Others: No Duty to Warn:no Physical Aggression / Violence:No  Access to Firearms a concern: No  Gang Involvement:No   Subjective: The patient attended a face-to-face individual therapy session in the office today.  The patient presents with a blunted affect and mood is pleasant.  The patient reports that he thought a lot about what we talked about the last time we met.  The patient reports that he feels like he does not have anything that he feels secure about.  We discussed things and ways that he may be able to sort through what is actually something that he feels secure about and things that he does not.  The patient reports that he and his wife struggle with intimacy and he talked about not feeling good enough in that relationship.  We briefly discussed some cognitions that possibly could be used when we target his core beliefs with the EMDR.  Interventions: Cognitive Behavioral Therapy, Assertiveness/Communication, and Insight-Oriented  Diagnosis:PTSD (post-traumatic stress disorder)  Adjustment disorder with  mixed anxiety and depressed mood  Plan: Client Abilities/Strengths  Insightful, motivated, supportive husband  Client Treatment Preferences  Outpatient Individual therapy/EMDR  Client Statement of Needs  "I am having nightmares and my therapist wondered if EMDR would be helpful"  Treatment Level  Outpatient Individual therapy  Symptoms  Demonstrates an exaggerated startle response.: No Description Entered (Status: improved). Depressed  or irritable mood.:  Displays a  significant decline in interest and engagement in activities.: (Status: maintained). Displays significant psychological and/or physiological distress resulting from internal and external  clues that are reminiscent of the traumatic event.:(Status: maintained).  Experiences disturbances in sleep.: No Description Entered (Status: maintained). Experiences disturbing  and persistent thoughts, images, and/or perceptions of the traumatic event.:  (Status:maintained). Experiences frequent nightmares.: No Description Entered (Status: improved).  Feelings of hopelessness, worthlessness, or inappropriate guilt.: No Description Entered (Status:  improved). Has been exposed to a traumatic event involving actual or perceived threat of death or  serious injury.: No Description Entered (Status: maintained). Impairment in social, occupational, or  other areas of functioning.: . Intentionally avoids activities,  places, people, or objects (e.g., up-armored vehicles) that evoke memories of the event.: (Status: improved). Reports difficulty concentrating as  well as feelings of guilt.:  Problems Addressed  Goals 1. Develop healthy thinking patterns and beliefs about self, others, and the world that lead to the alleviation and help prevent the relapse of  depression. Objective Identify and replace thoughts and beliefs that support depression. Target Date:09/17/2022 Frequency: Weekly Progress: 0 Modality: individual Related  Interventions 1. Explore and restructure underlying assumptions and beliefs reflected in biased self-talk that  may put  the client at risk for relapse or recurrence. 2. Conduct Cognitive-Behavioral Therapy (see Cognitive Behavior Therapy by Olevia Bowens; Overcoming Depression by Lynita Lombard al.), beginning with helping the client learn the connection among  cognition, depressive feelings, and actions. 2. Eliminate or reduce the negative impact trauma related symptoms have  on social, occupational, and family functioning. Objective Learn and implement personal skills to manage challenging situations related to trauma. Target Date: 09/17/2022 Frequency: Weekly Progress: 0 Modality: individual 3. No longer avoids persons, places, activities, and objects that are  reminiscent of the traumatic event. Objective Participate in Eye Movement Desensitization and Reprocessing (EMDR) to reduce emotional distress  related to traumatic thoughts, feelings, and images. Target Date: 09/17/2022 Frequency: Weekly Progress: 0 Modality: individual   Related Interventions 1. Utilize Eye Movement Desensitization and Reprocessing (EMDR) to reduce the client's  emotional reactivity to the traumatic event and reduce PTSD symptoms. Objective Learn and implement guided self-dialogue to manage thoughts, feelings, and urges brought on by  encounters with trauma-related situations. Target Date: 05/16/2024Frequency: Weekly Progress: 0 Modality: individual Related Interventions 1. Teach the client a guided self-dialogue procedure in which he/she learns to recognize  maladaptive self-talk, challenges its biases, copes with engendered feelings, overcomes  avoidance, and reinforces his/her accomplishments; review and reinforce progress, problemsolve obstacles. 4. No longer experiences intrusive event recollections, avoidance of event  reminders, intense arousal, or disinterest in activities or  relationships. 5. Thinks about  or openly discusses the traumatic event with others  without experiencing psychological or physiological distress. Diagnosis:  Adjustment Disorder with mixed anxiety and depression Conditions For Discharge Achievement of treatment goals and objectives  Aarnav Steagall G Suvi Archuletta, LCSW                  Taresa Montville G Carmellia Kreisler, LCSW               Rayola Everhart G Jeanelle Dake, LCSW               Dencil Cayson G Seeley [place], LCSW

## 2021-10-20 ENCOUNTER — Ambulatory Visit: Payer: Medicare Other | Admitting: Psychology

## 2021-10-22 DIAGNOSIS — R2689 Other abnormalities of gait and mobility: Secondary | ICD-10-CM | POA: Diagnosis not present

## 2021-10-22 DIAGNOSIS — M2142 Flat foot [pes planus] (acquired), left foot: Secondary | ICD-10-CM | POA: Diagnosis not present

## 2021-10-22 DIAGNOSIS — M79672 Pain in left foot: Secondary | ICD-10-CM | POA: Diagnosis not present

## 2021-10-22 DIAGNOSIS — M79671 Pain in right foot: Secondary | ICD-10-CM | POA: Diagnosis not present

## 2021-10-23 ENCOUNTER — Ambulatory Visit (INDEPENDENT_AMBULATORY_CARE_PROVIDER_SITE_OTHER): Payer: Medicare Other | Admitting: Psychology

## 2021-10-23 DIAGNOSIS — F4310 Post-traumatic stress disorder, unspecified: Secondary | ICD-10-CM

## 2021-10-23 DIAGNOSIS — F4323 Adjustment disorder with mixed anxiety and depressed mood: Secondary | ICD-10-CM

## 2021-10-24 NOTE — Progress Notes (Signed)
[facility] Counselor/Therapist Progress Note  Patient ID: Jared Alvarez, MRN: [id number],    Date: 10/23/2021  Time Spent: 60 minutes  Treatment Type: Individual Therapy  Reported Symptoms: The patient attended a face-to-face individual therapy session in the office today.  The patient reports having feelings of insecurity and depression.  Mental Status Exam: Appearance:  Casual     Behavior: Appropriate  Motor: Normal  Speech/Language:  Normal Rate  Affect: Blunt  Mood: depressed  Thought process: normal  Thought content:   WNL  Sensory/Perceptual disturbances:   WNL  Orientation: oriented to person, place, time/date, and situation  Attention: Good  Concentration: Good  Memory: WNL  Fund of knowledge:  Good  Insight:   Good  Judgment:  Good  Impulse Control: Good   Risk Assessment: Danger to Self:  No Self-injurious Behavior: No Danger to Others: No Duty to Warn:no Physical Aggression / Violence:No  Access to Firearms a concern: No  Gang Involvement:No   Subjective: The patient attended a face-to-face individual therapy session in the office today.  The patient presents with a blunted affect and mood is depressed.  The patient reports that he and his wife had a struggle today with doing a project.  He talked about how she responded to him and feels frustrated and sad about what happened.  The patient talked about not knowing what he wants.  It seems that what he would like is to be able to be more secure with his own instincts.  There are some issues with his wife and we talked about the possibility that he may need to consider marital therapy if they are going to work through some of their issues.  The patient talked about feeling like she does not like him very much and she is very negative with him a lot.  We talked about those dynamics being related to his upbringing and how his father interacted with him.  We will continue to explore how to move  forward to help patient feel better about himself and feel more secure within himself by using EMDR for core belief work. Interventions: Cognitive Behavioral Therapy, Assertiveness/Communication, and Insight-Oriented  Diagnosis:PTSD (post-traumatic stress disorder)  Adjustment disorder with mixed anxiety and depressed mood  Plan: Client Abilities/Strengths  Insightful, motivated, supportive husband  Client Treatment Preferences  Outpatient Individual therapy/EMDR  Client Statement of Needs  "I am having nightmares and my therapist wondered if EMDR would be helpful"  Treatment Level  Outpatient Individual therapy  Symptoms  Demonstrates an exaggerated startle response.: No Description Entered (Status: improved). Depressed  or irritable mood.:  Displays a  significant decline in interest and engagement in activities.: (Status: maintained). Displays significant psychological and/or physiological distress resulting from internal and external  clues that are reminiscent of the traumatic event.:(Status: maintained).  Experiences disturbances in sleep.: No Description Entered (Status: maintained). Experiences disturbing  and persistent thoughts, images, and/or perceptions of the traumatic event.:  (Status:maintained). Experiences frequent nightmares.: No Description Entered (Status: improved).  Feelings of hopelessness, worthlessness, or inappropriate guilt.: No Description Entered (Status:  improved). Has been exposed to a traumatic event involving actual or perceived threat of death or  serious injury.: No Description Entered (Status: maintained). Impairment in social, occupational, or  other areas of functioning.: . Intentionally avoids activities,  places, people, or objects (e.g., up-armored vehicles) that evoke memories of the event.: (Status: improved). Reports difficulty concentrating as  well as feelings of guilt.:  Problems Addressed  Goals 1. Develop healthy thinking patterns  and  beliefs about self, others, and the world that lead to the alleviation and help prevent the relapse of  depression. Objective Identify and replace thoughts and beliefs that support depression. Target Date:09/17/2022 Frequency: Weekly Progress: 0 Modality: individual Related Interventions 1. Explore and restructure underlying assumptions and beliefs reflected in biased self-talk that  may put the client at risk for relapse or recurrence. 2. Conduct Cognitive-Behavioral Therapy (see Cognitive Behavior Therapy by Reola Calkins; Overcoming Depression by Agapito Games al.), beginning with helping the client learn the connection among  cognition, depressive feelings, and actions. 2. Eliminate or reduce the negative impact trauma related symptoms have  on social, occupational, and family functioning. Objective Learn and implement personal skills to manage challenging situations related to trauma. Target Date: 09/17/2022 Frequency: Weekly Progress: 0 Modality: individual 3. No longer avoids persons, places, activities, and objects that are  reminiscent of the traumatic event. Objective Participate in Eye Movement Desensitization and Reprocessing (EMDR) to reduce emotional distress  related to traumatic thoughts, feelings, and images. Target Date: 09/17/2022 Frequency: Weekly Progress: 0 Modality: individual   Related Interventions 1. Utilize Eye Movement Desensitization and Reprocessing (EMDR) to reduce the client's  emotional reactivity to the traumatic event and reduce PTSD symptoms. Objective Learn and implement guided self-dialogue to manage thoughts, feelings, and urges brought on by  encounters with trauma-related situations. Target Date: 05/16/2024Frequency: Weekly Progress: 0 Modality: individual Related Interventions 1. Teach the client a guided self-dialogue procedure in which he/she learns to recognize  maladaptive self-talk, challenges its biases, copes with engendered feelings,  overcomes  avoidance, and reinforces his/her accomplishments; review and reinforce progress, problemsolve obstacles. 4. No longer experiences intrusive event recollections, avoidance of event  reminders, intense arousal, or disinterest in activities or  relationships. 5. Thinks about or openly discusses the traumatic event with others  without experiencing psychological or physiological distress. Diagnosis:  Adjustment Disorder with mixed anxiety and depression Conditions For Discharge Achievement of treatment goals and objectives  Jared Alvarez G Empress Newmann, LCSW                  Itzel Lowrimore G Sherelle Castelli, LCSW               Kiara Keep G Arohi Salvatierra, LCSW               Sachit Gilman G Jermy Couper, LCSW               Freda Jaquith G Marquavius Scaife, LCSW

## 2021-10-29 ENCOUNTER — Ambulatory Visit (INDEPENDENT_AMBULATORY_CARE_PROVIDER_SITE_OTHER): Payer: Medicare Other | Admitting: Psychology

## 2021-10-29 DIAGNOSIS — F4323 Adjustment disorder with mixed anxiety and depressed mood: Secondary | ICD-10-CM | POA: Diagnosis not present

## 2021-10-29 DIAGNOSIS — F4310 Post-traumatic stress disorder, unspecified: Secondary | ICD-10-CM

## 2021-10-29 NOTE — Progress Notes (Signed)
[facility] Counselor/Therapist Progress Note  Patient ID: CELESTER LECH, MRN: [id number],    Date: 10/29/2021  Time Spent: 60 minutes  Treatment Type: Individual Therapy  Reported Symptoms: The patient attended a face-to-face individual therapy session in the office today.  The patient reports having feelings of insecurity and depression.  Mental Status Exam: Appearance:  Casual     Behavior: Appropriate  Motor: Normal  Speech/Language:  Normal Rate  Affect: Blunt  Mood: pleasant  Thought process: normal  Thought content:   WNL  Sensory/Perceptual disturbances:   WNL  Orientation: oriented to person, place, time/date, and situation  Attention: Good  Concentration: Good  Memory: WNL  Fund of knowledge:  Good  Insight:   Good  Judgment:  Good  Impulse Control: Good   Risk Assessment: Danger to Self:  No Self-injurious Behavior: No Danger to Others: No Duty to Warn:no Physical Aggression / Violence:No  Access to Firearms a concern: No  Gang Involvement:No   Subjective: The patient attended a face-to-face individual therapy session in the office today.  The patient presents with a blunted affect and mood is pleasant.  The patient continues to present as a little tangential when he comes in for sessions.  The patient does not seem to know what he wants to work on and seems to have a lot of self doubt.  We talked about the negative cognition of I am a bad person as being something that we do need to target with the EMDR.  We also will target I am hurt and go back to some of his [place] memories.  We will attempt to get the patient to focus a little more on what it is that we want to work on.  Interventions: Cognitive Behavioral Therapy, Assertiveness/Communication, and Insight-Oriented  Diagnosis:PTSD (post-traumatic stress disorder)  Adjustment disorder with mixed anxiety and depressed mood  Plan: Client Abilities/Strengths  Insightful, motivated,  supportive husband  Client Treatment Preferences  Outpatient Individual therapy/EMDR  Client Statement of Needs  "I am having nightmares and my therapist wondered if EMDR would be helpful"  Treatment Level  Outpatient Individual therapy  Symptoms  Demonstrates an exaggerated startle response.: No Description Entered (Status: improved). Depressed  or irritable mood.:  Displays a  significant decline in interest and engagement in activities.: (Status: maintained). Displays significant psychological and/or physiological distress resulting from internal and external  clues that are reminiscent of the traumatic event.:(Status: maintained).  Experiences disturbances in sleep.: No Description Entered (Status: maintained). Experiences disturbing  and persistent thoughts, images, and/or perceptions of the traumatic event.:  (Status:maintained). Experiences frequent nightmares.: No Description Entered (Status: improved).  Feelings of hopelessness, worthlessness, or inappropriate guilt.: No Description Entered (Status:  improved). Has been exposed to a traumatic event involving actual or perceived threat of death or  serious injury.: No Description Entered (Status: maintained). Impairment in social, occupational, or  other areas of functioning.: . Intentionally avoids activities,  places, people, or objects (e.g., up-armored vehicles) that evoke memories of the event.: (Status: improved). Reports difficulty concentrating as  well as feelings of guilt.:  Problems Addressed  Goals 1. Develop healthy thinking patterns and beliefs about self, others, and the world that lead to the alleviation and help prevent the relapse of  depression. Objective Identify and replace thoughts and beliefs that support depression. Target Date:09/17/2022 Frequency: Weekly Progress: 0 Modality: individual Related Interventions 1. Explore and restructure underlying assumptions and beliefs reflected in biased self-talk  that  may put the client at risk for  relapse or recurrence. 2. Conduct Cognitive-Behavioral Therapy (see Cognitive Behavior Therapy by Olevia Bowens; Overcoming Depression by Lynita Lombard al.), beginning with helping the client learn the connection among  cognition, depressive feelings, and actions. 2. Eliminate or reduce the negative impact trauma related symptoms have  on social, occupational, and family functioning. Objective Learn and implement personal skills to manage challenging situations related to trauma. Target Date: 09/17/2022 Frequency: Weekly Progress: 0 Modality: individual 3. No longer avoids persons, places, activities, and objects that are  reminiscent of the traumatic event. Objective Participate in Eye Movement Desensitization and Reprocessing (EMDR) to reduce emotional distress  related to traumatic thoughts, feelings, and images. Target Date: 09/17/2022 Frequency: Weekly Progress: 0 Modality: individual   Related Interventions 1. Utilize Eye Movement Desensitization and Reprocessing (EMDR) to reduce the client's  emotional reactivity to the traumatic event and reduce PTSD symptoms. Objective Learn and implement guided self-dialogue to manage thoughts, feelings, and urges brought on by  encounters with trauma-related situations. Target Date: 05/16/2024Frequency: Weekly Progress: 0 Modality: individual Related Interventions 1. Teach the client a guided self-dialogue procedure in which he/she learns to recognize  maladaptive self-talk, challenges its biases, copes with engendered feelings, overcomes  avoidance, and reinforces his/her accomplishments; review and reinforce progress, problemsolve obstacles. 4. No longer experiences intrusive event recollections, avoidance of event  reminders, intense arousal, or disinterest in activities or  relationships. 5. Thinks about or openly discusses the traumatic event with others  without experiencing psychological or physiological  distress. Diagnosis:  Adjustment Disorder with mixed anxiety and depression Conditions For Discharge Achievement of treatment goals and objectives  Burr Soffer G Jillayne Witte, LCSW                  Atalya Dano G Ranald Alessio, LCSW               Mahmoud Blazejewski G Tandre Conly, LCSW               Javontae Marlette G Ibn Stief, LCSW               Gareld Obrecht G Avi Archuleta, LCSW               Keymoni Mccaster [employer], LCSW

## 2021-10-30 DIAGNOSIS — R2689 Other abnormalities of gait and mobility: Secondary | ICD-10-CM | POA: Diagnosis not present

## 2021-10-30 DIAGNOSIS — M79672 Pain in left foot: Secondary | ICD-10-CM | POA: Diagnosis not present

## 2021-10-30 DIAGNOSIS — M2142 Flat foot [pes planus] (acquired), left foot: Secondary | ICD-10-CM | POA: Diagnosis not present

## 2021-10-30 DIAGNOSIS — M79671 Pain in right foot: Secondary | ICD-10-CM | POA: Diagnosis not present

## 2021-11-03 ENCOUNTER — Ambulatory Visit: Payer: Medicare Other | Admitting: Psychology

## 2021-11-06 ENCOUNTER — Ambulatory Visit (INDEPENDENT_AMBULATORY_CARE_PROVIDER_SITE_OTHER): Payer: Medicare Other | Admitting: Psychology

## 2021-11-06 DIAGNOSIS — F4310 Post-traumatic stress disorder, unspecified: Secondary | ICD-10-CM | POA: Diagnosis not present

## 2021-11-06 DIAGNOSIS — F4323 Adjustment disorder with mixed anxiety and depressed mood: Secondary | ICD-10-CM | POA: Diagnosis not present

## 2021-11-07 NOTE — Progress Notes (Signed)
[place] Counselor/Therapist Progress Note  Patient ID: Jared Alvarez, MRN: [id number],    Date: 11/06/2021  Time Spent: 60 minutes  Treatment Type: Individual Therapy  Reported Symptoms: The patient attended a face-to-face individual therapy session in the office today.  The patient reports having feelings of insecurity and depression.  Mental Status Exam: Appearance:  Casual     Behavior: Appropriate  Motor: Normal  Speech/Language:  Normal Rate  Affect: Blunt  Mood: pleasant  Thought process: normal  Thought content:   WNL  Sensory/Perceptual disturbances:   WNL  Orientation: oriented to person, place, time/date, and situation  Attention: Good  Concentration: Good  Memory: WNL  Fund of knowledge:  Good  Insight:   Good  Judgment:  Good  Impulse Control: Good   Risk Assessment: Danger to Self:  No Self-injurious Behavior: No Danger to Others: No Duty to Warn:no Physical Aggression / Violence:No  Access to Firearms a concern: No  Gang Involvement:No   Subjective: The patient attended a face-to-face individual therapy session in the office today.  The patient presents with a blunted affect and mood is pleasant.  The patient reports that he spoke with a couple of people over the last week about possibly getting involved in some projects and this seemed to energize him.  We talked about the need for him to focus on the things that give him energy and help him move forward and to desensitize him to those things that make him feel critical criticized and not valuable.  We began talking about how EMDR works and discussed going ahead and getting that process started with some of the negative cognitions from when he was a child.  Will do preparatory work for EMDR during the next session and begin to move forward with this process. Interventions: Cognitive Behavioral Therapy, Assertiveness/Communication, and Insight-Oriented  Diagnosis:PTSD (post-traumatic  stress disorder)  Adjustment disorder with mixed anxiety and depressed mood  Plan: Client Abilities/Strengths  Insightful, motivated, supportive husband  Client Treatment Preferences  Outpatient Individual therapy/EMDR  Client Statement of Needs  "I am having nightmares and my therapist wondered if EMDR would be helpful"  Treatment Level  Outpatient Individual therapy  Symptoms  Demonstrates an exaggerated startle response.: No Description Entered (Status: improved). Depressed  or irritable mood.:  Displays a  significant decline in interest and engagement in activities.: (Status: maintained). Displays significant psychological and/or physiological distress resulting from internal and external  clues that are reminiscent of the traumatic event.:(Status: maintained).  Experiences disturbances in sleep.: No Description Entered (Status: maintained). Experiences disturbing  and persistent thoughts, images, and/or perceptions of the traumatic event.:  (Status:maintained). Experiences frequent nightmares.: No Description Entered (Status: improved).  Feelings of hopelessness, worthlessness, or inappropriate guilt.: No Description Entered (Status:  improved). Has been exposed to a traumatic event involving actual or perceived threat of death or  serious injury.: No Description Entered (Status: maintained). Impairment in social, occupational, or  other areas of functioning.: . Intentionally avoids activities,  places, people, or objects (e.g., up-armored vehicles) that evoke memories of the event.: (Status: improved). Reports difficulty concentrating as  well as feelings of guilt.:  Problems Addressed  Goals 1. Develop healthy thinking patterns and beliefs about self, others, and the world that lead to the alleviation and help prevent the relapse of  depression. Objective Identify and replace thoughts and beliefs that support depression. Target Date:09/17/2022 Frequency: Weekly Progress: 0  Modality: individual Related Interventions 1. Explore and restructure underlying assumptions and beliefs reflected in biased  self-talk that  may put the client at risk for relapse or recurrence. 2. Conduct Cognitive-Behavioral Therapy (see Cognitive Behavior Therapy by Olevia Bowens; Overcoming Depression by Lynita Lombard al.), beginning with helping the client learn the connection among  cognition, depressive feelings, and actions. 2. Eliminate or reduce the negative impact trauma related symptoms have  on social, occupational, and family functioning. Objective Learn and implement personal skills to manage challenging situations related to trauma. Target Date: 09/17/2022 Frequency: Weekly Progress: 0 Modality: individual 3. No longer avoids persons, places, activities, and objects that are  reminiscent of the traumatic event. Objective Participate in Eye Movement Desensitization and Reprocessing (EMDR) to reduce emotional distress  related to traumatic thoughts, feelings, and images. Target Date: 09/17/2022 Frequency: Weekly Progress: 0 Modality: individual   Related Interventions 1. Utilize Eye Movement Desensitization and Reprocessing (EMDR) to reduce the client's  emotional reactivity to the traumatic event and reduce PTSD symptoms. Objective Learn and implement guided self-dialogue to manage thoughts, feelings, and urges brought on by  encounters with trauma-related situations. Target Date: 05/16/2024Frequency: Weekly Progress: 0 Modality: individual Related Interventions 1. Teach the client a guided self-dialogue procedure in which he/she learns to recognize  maladaptive self-talk, challenges its biases, copes with engendered feelings, overcomes  avoidance, and reinforces his/her accomplishments; review and reinforce progress, problemsolve obstacles. 4. No longer experiences intrusive event recollections, avoidance of event  reminders, intense arousal, or disinterest in activities or   relationships. 5. Thinks about or openly discusses the traumatic event with others  without experiencing psychological or physiological distress. Diagnosis:  Adjustment Disorder with mixed anxiety and depression Conditions For Discharge Achievement of treatment goals and objectives  Aimar Shrewsbury G Calan Doren, LCSW                  Felesia Stahlecker G Shaakira Borrero, LCSW               Minoru Chap G Ameriah Lint, LCSW               Sirr Kabel G Kwinton Maahs, LCSW               Kennette Cuthrell G Siboney Requejo, LCSW               Meliss Fleek G Suvan Stcyr, LCSW               Tywana Robotham [employer], LCSW

## 2021-11-11 ENCOUNTER — Ambulatory Visit (INDEPENDENT_AMBULATORY_CARE_PROVIDER_SITE_OTHER): Payer: Medicare Other | Admitting: Psychology

## 2021-11-11 DIAGNOSIS — F4310 Post-traumatic stress disorder, unspecified: Secondary | ICD-10-CM

## 2021-11-11 DIAGNOSIS — F4323 Adjustment disorder with mixed anxiety and depressed mood: Secondary | ICD-10-CM

## 2021-11-11 NOTE — Progress Notes (Signed)
[facility] Counselor/Therapist Progress Note  Patient ID: Jared Alvarez, MRN: [id number],    Date: 11/11/2021  Time Spent: 60 minutes  Treatment Type: Individual Therapy  Reported Symptoms: The patient attended a face-to-face individual therapy session in the office today.  The patient reports having feelings of insecurity and depression.  Mental Status Exam: Appearance:  Casual     Behavior: Appropriate  Motor: Normal  Speech/Language:  Normal Rate  Affect: Blunt  Mood: pleasant  Thought process: normal  Thought content:   WNL  Sensory/Perceptual disturbances:   WNL  Orientation: oriented to person, place, time/date, and situation  Attention: Good  Concentration: Good  Memory: WNL  Fund of knowledge:  Good  Insight:   Good  Judgment:  Good  Impulse Control: Good   Risk Assessment: Danger to Self:  No Self-injurious Behavior: No Danger to Others: No Duty to Warn:no Physical Aggression / Violence:No  Access to Firearms a concern: No  Gang Involvement:No   Subjective: The patient attended a face-to-face individual therapy session in the office today.  The patient presents with a blunted affect and mood is pleasant.  We talked a little today about his son saying that dad is a little emotionally detached.  We talked about this in relation to where he grew up and why he does what he does as far as thinking about every possible scenario before he responds.  We also started the process of EMDR.  We did the safe place exercise and to contain her exercise today and will begin working on the negative cognition of I am hurt during the next session.  Interventions: Cognitive Behavioral Therapy, Assertiveness/Communication, and Insight-Oriented  Diagnosis:PTSD (post-traumatic stress disorder)  Adjustment disorder with mixed anxiety and depressed mood  Plan: Client Abilities/Strengths  Insightful, motivated, supportive husband  Client Treatment Preferences   Outpatient Individual therapy/EMDR  Client Statement of Needs  "I am having nightmares and my therapist wondered if EMDR would be helpful"  Treatment Level  Outpatient Individual therapy  Symptoms  Demonstrates an exaggerated startle response.: No Description Entered (Status: improved). Depressed  or irritable mood.:  Displays a  significant decline in interest and engagement in activities.: (Status: maintained). Displays significant psychological and/or physiological distress resulting from internal and external  clues that are reminiscent of the traumatic event.:(Status: maintained).  Experiences disturbances in sleep.: (Status: maintained). Experiences disturbing  and persistent thoughts, images, and/or perceptions of the traumatic event.:  (Status:maintained). Experiences frequent nightmares.: (Status: improved).  Feelings of hopelessness, worthlessness, or inappropriate guilt.: (Status:  improved). Has been exposed to a traumatic event involving actual or perceived threat of death or  serious injury.:  (Status: maintained). Impairment in social, occupational, or  other areas of functioning.: . Intentionally avoids activities,  places, people, or objects (e.g., up-armored vehicles) that evoke memories of the event.: (Status: improved). Reports difficulty concentrating as  well as feelings of guilt.:  Problems Addressed  Goals 1. Develop healthy thinking patterns and beliefs about self, others, and the world that lead to the alleviation and help prevent the relapse of  depression. Objective Identify and replace thoughts and beliefs that support depression. Target Date:09/17/2022 Frequency: Weekly Progress: 0 Modality: individual Related Interventions 1. Explore and restructure underlying assumptions and beliefs reflected in biased self-talk that  may put the client at risk for relapse or recurrence. 2. Conduct Cognitive-Behavioral Therapy (see Cognitive Behavior Therapy by Olevia Bowens;  Overcoming Depression by Lynita Lombard al.), beginning with helping the client learn the connection among  cognition, depressive  feelings, and actions. 2. Eliminate or reduce the negative impact trauma related symptoms have  on social, occupational, and family functioning. Objective Learn and implement personal skills to manage challenging situations related to trauma. Target Date: 09/17/2022 Frequency: Weekly Progress: 0 Modality: individual 3. No longer avoids persons, places, activities, and objects that are  reminiscent of the traumatic event. Objective Participate in Eye Movement Desensitization and Reprocessing (EMDR) to reduce emotional distress  related to traumatic thoughts, feelings, and images. Target Date: 09/17/2022 Frequency: Weekly Progress: 0 Modality: individual   Related Interventions 1. Utilize Eye Movement Desensitization and Reprocessing (EMDR) to reduce the client's  emotional reactivity to the traumatic event and reduce PTSD symptoms. Objective Learn and implement guided self-dialogue to manage thoughts, feelings, and urges brought on by  encounters with trauma-related situations. Target Date: 05/16/2024Frequency: Weekly Progress: 0 Modality: individual Related Interventions 1. Teach the client a guided self-dialogue procedure in which he/she learns to recognize  maladaptive self-talk, challenges its biases, copes with engendered feelings, overcomes  avoidance, and reinforces his/her accomplishments; review and reinforce progress, problemsolve obstacles. 4. No longer experiences intrusive event recollections, avoidance of event  reminders, intense arousal, or disinterest in activities or  relationships. 5. Thinks about or openly discusses the traumatic event with others  without experiencing psychological or physiological distress. Diagnosis:  Adjustment Disorder with mixed anxiety and depression Conditions For Discharge Achievement of treatment goals and  objectives  Natasia Sanko G Lacoya Wilbanks, LCSW                  Patric Vanpelt G Gracin Mcpartland, LCSW               Keslie Gritz G Shenetta Schnackenberg, LCSW               Lynae Pederson G Wannetta Langland, LCSW               Dione Mccombie G Obie Kallenbach, LCSW               Vincie Linn [employer], LCSW               Kensie Susman G Angles Trevizo, LCSW               Jemari Hallum [employer], LCSW

## 2021-11-12 DIAGNOSIS — M79672 Pain in left foot: Secondary | ICD-10-CM | POA: Diagnosis not present

## 2021-11-12 DIAGNOSIS — M2142 Flat foot [pes planus] (acquired), left foot: Secondary | ICD-10-CM | POA: Diagnosis not present

## 2021-11-12 DIAGNOSIS — R2689 Other abnormalities of gait and mobility: Secondary | ICD-10-CM | POA: Diagnosis not present

## 2021-11-12 DIAGNOSIS — M79671 Pain in right foot: Secondary | ICD-10-CM | POA: Diagnosis not present

## 2021-11-17 ENCOUNTER — Ambulatory Visit: Payer: Medicare Other | Admitting: Psychology

## 2021-11-18 ENCOUNTER — Ambulatory Visit (INDEPENDENT_AMBULATORY_CARE_PROVIDER_SITE_OTHER): Payer: Medicare Other | Admitting: Psychology

## 2021-11-18 DIAGNOSIS — Z630 Problems in relationship with spouse or partner: Secondary | ICD-10-CM

## 2021-11-18 DIAGNOSIS — F4323 Adjustment disorder with mixed anxiety and depressed mood: Secondary | ICD-10-CM | POA: Diagnosis not present

## 2021-11-18 DIAGNOSIS — F4310 Post-traumatic stress disorder, unspecified: Secondary | ICD-10-CM

## 2021-11-18 NOTE — Progress Notes (Signed)
[facility] Counselor/Therapist Progress Note  Patient ID: Jared Alvarez, MRN: [id number],    Date: 11/18/2021  Time Spent: 60 minutes  Treatment Type: Individual Therapy  Reported Symptoms: The patient attended a face-to-face individual therapy session in the office today.  The patient reports having feelings of insecurity and depression.  Mental Status Exam: Appearance:  Casual     Behavior: Appropriate  Motor: Normal  Speech/Language:  Normal Rate  Affect: Blunt  Mood: pleasant  Thought process: normal  Thought content:   WNL  Sensory/Perceptual disturbances:   WNL  Orientation: oriented to person, place, time/date, and situation  Attention: Good  Concentration: Good  Memory: WNL  Fund of knowledge:  Good  Insight:   Good  Judgment:  Good  Impulse Control: Good   Risk Assessment: Danger to Self:  No Self-injurious Behavior: No Danger to Others: No Duty to Warn:no Physical Aggression / Violence:No  Access to Firearms a concern: No  Gang Involvement:No   Subjective: The patient attended a face-to-face individual therapy session in the office today.  The patient presents with a blunted affect and mood is pleasant.  Today the patient reports that he and his wife are struggling.  We talked about what is going on with them and how the dynamic is playing out.  We discussed different options of how to work with the situation.  We talked about the possibility of them going to marriage counseling.  We also discussed him becoming more desensitized to the criticism and how to respond to her in a different way.  I recommended that he practice a different communication skill of responding to her authenticity rather than her judgment or control.  Explained to him how this works and hopefully this will change some of the dynamics that are happening with the relationship.  The patient does not feel hopeful that she is willing to go to therapy however I will continue to  recommend that they consider that as an option.  Interventions: Cognitive Behavioral Therapy, Assertiveness/Communication, and Insight-Oriented  Diagnosis:No diagnosis found.  Plan: Client Abilities/Strengths  Insightful, motivated, supportive husband  Client Treatment Preferences  Outpatient Individual therapy/EMDR  Client Statement of Needs  "I am having nightmares and my therapist wondered if EMDR would be helpful"  Treatment Level  Outpatient Individual therapy  Symptoms  Demonstrates an exaggerated startle response.: No Description Entered (Status: improved). Depressed  or irritable mood.:  Displays a  significant decline in interest and engagement in activities.: (Status: maintained). Displays significant psychological and/or physiological distress resulting from internal and external  clues that are reminiscent of the traumatic event.:(Status: maintained).  Experiences disturbances in sleep.: (Status: maintained). Experiences disturbing  and persistent thoughts, images, and/or perceptions of the traumatic event.:  (Status:maintained). Experiences frequent nightmares.: (Status: improved).  Feelings of hopelessness, worthlessness, or inappropriate guilt.: (Status:  improved). Has been exposed to a traumatic event involving actual or perceived threat of death or  serious injury.:  (Status: maintained). Impairment in social, occupational, or  other areas of functioning.: . Intentionally avoids activities,  places, people, or objects (e.g., up-armored vehicles) that evoke memories of the event.: (Status: improved). Reports difficulty concentrating as  well as feelings of guilt.:  Problems Addressed  Goals 1. Develop healthy thinking patterns and beliefs about self, others, and the world that lead to the alleviation and help prevent the relapse of  depression. Objective Identify and replace thoughts and beliefs that support depression. Target Date:09/17/2022 Frequency:  Weekly Progress: 30 Modality: individual Related Interventions 1.  Explore and restructure underlying assumptions and beliefs reflected in biased self-talk that  may put the client at risk for relapse or recurrence. 2. Conduct Cognitive-Behavioral Therapy (see Cognitive Behavior Therapy by Olevia Bowens; Overcoming Depression by Lynita Lombard al.), beginning with helping the client learn the connection among  cognition, depressive feelings, and actions. 2. Eliminate or reduce the negative impact trauma related symptoms have  on social, occupational, and family functioning. Objective Learn and implement personal skills to manage challenging situations related to trauma. Target Date: 09/17/2022 Frequency: Weekly Progress: 0 Modality: individual 3. No longer avoids persons, places, activities, and objects that are  reminiscent of the traumatic event. Objective Participate in Eye Movement Desensitization and Reprocessing (EMDR) to reduce emotional distress  related to traumatic thoughts, feelings, and images. Target Date: 09/17/2022 Frequency: Weekly Progress: 10 Modality: individual   Related Interventions 1. Utilize Eye Movement Desensitization and Reprocessing (EMDR) to reduce the client's  emotional reactivity to the traumatic event and reduce PTSD symptoms. Objective Learn and implement guided self-dialogue to manage thoughts, feelings, and urges brought on by  encounters with trauma-related situations. Target Date: 05/16/2024Frequency: Weekly Progress: 0 Modality: individual Related Interventions 1. Teach the client a guided self-dialogue procedure in which he/she learns to recognize  maladaptive self-talk, challenges its biases, copes with engendered feelings, overcomes  avoidance, and reinforces his/her accomplishments; review and reinforce progress, problemsolve obstacles. 4. No longer experiences intrusive event recollections, avoidance of event  reminders, intense arousal, or  disinterest in activities or  relationships. 5. Thinks about or openly discusses the traumatic event with others  without experiencing psychological or physiological distress. Diagnosis:  Adjustment Disorder with mixed anxiety and depression Conditions For Discharge Achievement of treatment goals and objectives  Lam Bjorklund G Jennell Janosik, LCSW                  Johnathon Olden G Savon Bordonaro, LCSW               Alayzha An G Horton [place], LCSW               Tasheika Kitzmiller G Telly Jawad, LCSW               Corissa Oguinn [employer], LCSW               Shawneequa Baldridge [employer], LCSW               Moriya Mitchell [employer], LCSW               Moosa Bueche G Zhion Pevehouse, LCSW               Claribel Sachs [employer], LCSW

## 2021-11-19 DIAGNOSIS — M79672 Pain in left foot: Secondary | ICD-10-CM | POA: Diagnosis not present

## 2021-11-19 DIAGNOSIS — M2142 Flat foot [pes planus] (acquired), left foot: Secondary | ICD-10-CM | POA: Diagnosis not present

## 2021-11-19 DIAGNOSIS — M79671 Pain in right foot: Secondary | ICD-10-CM | POA: Diagnosis not present

## 2021-11-19 DIAGNOSIS — R2689 Other abnormalities of gait and mobility: Secondary | ICD-10-CM | POA: Diagnosis not present

## 2021-11-25 ENCOUNTER — Ambulatory Visit (INDEPENDENT_AMBULATORY_CARE_PROVIDER_SITE_OTHER): Payer: Medicare Other | Admitting: Psychology

## 2021-11-25 DIAGNOSIS — F4323 Adjustment disorder with mixed anxiety and depressed mood: Secondary | ICD-10-CM | POA: Diagnosis not present

## 2021-11-25 DIAGNOSIS — M2142 Flat foot [pes planus] (acquired), left foot: Secondary | ICD-10-CM | POA: Diagnosis not present

## 2021-11-25 DIAGNOSIS — F4310 Post-traumatic stress disorder, unspecified: Secondary | ICD-10-CM

## 2021-11-25 DIAGNOSIS — R2689 Other abnormalities of gait and mobility: Secondary | ICD-10-CM | POA: Diagnosis not present

## 2021-11-25 DIAGNOSIS — M79671 Pain in right foot: Secondary | ICD-10-CM | POA: Diagnosis not present

## 2021-11-25 DIAGNOSIS — Z630 Problems in relationship with spouse or partner: Secondary | ICD-10-CM

## 2021-11-25 DIAGNOSIS — M79672 Pain in left foot: Secondary | ICD-10-CM | POA: Diagnosis not present

## 2021-11-26 NOTE — Progress Notes (Signed)
[facility] Counselor/Therapist Progress Note  Patient ID: Jared Alvarez, MRN: [id number],    Date: 11/25/2021  Time Spent: 60 minutes  Treatment Type: Individual Therapy  Reported Symptoms: The patient attended a face-to-face individual therapy session in the office today.  The patient reports having feelings of insecurity and depression.  Mental Status Exam: Appearance:  Casual     Behavior: Appropriate  Motor: Normal  Speech/Language:  Normal Rate  Affect: Blunt  Mood: pleasant  Thought process: normal  Thought content:   WNL  Sensory/Perceptual disturbances:   WNL  Orientation: oriented to person, place, time/date, and situation  Attention: Good  Concentration: Good  Memory: WNL  Fund of knowledge:  Good  Insight:   Good  Judgment:  Good  Impulse Control: Good   Risk Assessment: Danger to Self:  No Self-injurious Behavior: No Danger to Others: No Duty to Warn:no Physical Aggression / Violence:No  Access to Firearms a concern: No  Gang Involvement:No   Subjective: The patient attended a face-to-face individual therapy session in the office today.  The patient presents with a blunted affect and mood is pleasant.  The patient reports that today he wants to work on EMDR.  We began processing the negative cognition of I am hurt.  The patient did become emotional during this session and we replaced the negative cognition of I am hurt with I am okay and I have got this.  The patient reported a suds scale of 8-9 at the beginning of the EMDR and reported a decrease but would not give a specific number.  Interventions: Cognitive Behavioral Therapy, Assertiveness/Communication, and Insight-Oriented  Diagnosis:PTSD (post-traumatic stress disorder)  Adjustment disorder with mixed anxiety and depressed mood  Marital conflict  Plan: Client Abilities/Strengths  Insightful, motivated, supportive husband  Client Treatment Preferences  Outpatient Individual  therapy/EMDR  Client Statement of Needs  "I am having nightmares and my therapist wondered if EMDR would be helpful"  Treatment Level  Outpatient Individual therapy  Symptoms  Demonstrates an exaggerated startle response.: No Description Entered (Status: improved). Depressed  or irritable mood.:  Displays a  significant decline in interest and engagement in activities.: (Status: maintained). Displays significant psychological and/or physiological distress resulting from internal and external  clues that are reminiscent of the traumatic event.:(Status: maintained).  Experiences disturbances in sleep.: (Status: maintained). Experiences disturbing  and persistent thoughts, images, and/or perceptions of the traumatic event.:  (Status:maintained). Experiences frequent nightmares.: (Status: improved).  Feelings of hopelessness, worthlessness, or inappropriate guilt.: (Status:  improved). Has been exposed to a traumatic event involving actual or perceived threat of death or  serious injury.:  (Status: maintained). Impairment in social, occupational, or  other areas of functioning.: . Intentionally avoids activities,  places, people, or objects (e.Alvarez., up-armored vehicles) that evoke memories of the event.: (Status: improved). Reports difficulty concentrating as  well as feelings of guilt.:  Problems Addressed  Goals 1. Develop healthy thinking patterns and beliefs about self, others, and the world that lead to the alleviation and help prevent the relapse of  depression. Objective Identify and replace thoughts and beliefs that support depression. Target Date:09/17/2022 Frequency: Weekly Progress: 30 Modality: individual Related Interventions 1. Explore and restructure underlying assumptions and beliefs reflected in biased self-talk that  may put the client at risk for relapse or recurrence. 2. Conduct Cognitive-Behavioral Therapy (see Cognitive Behavior Therapy by Olevia Bowens; Overcoming Depression  by Lynita Lombard al.), beginning with helping the client learn the connection among  cognition, depressive feelings, and actions. 2.  Eliminate or reduce the negative impact trauma related symptoms have  on social, occupational, and family functioning. Objective Learn and implement personal skills to manage challenging situations related to trauma. Target Date: 09/17/2022 Frequency: Weekly Progress: 0 Modality: individual 3. No longer avoids persons, places, activities, and objects that are  reminiscent of the traumatic event. Objective Participate in Eye Movement Desensitization and Reprocessing (EMDR) to reduce emotional distress  related to traumatic thoughts, feelings, and images. Target Date: 09/17/2022 Frequency: Weekly Progress: 10 Modality: individual   Related Interventions 1. Utilize Eye Movement Desensitization and Reprocessing (EMDR) to reduce the client's  emotional reactivity to the traumatic event and reduce PTSD symptoms. Objective Learn and implement guided self-dialogue to manage thoughts, feelings, and urges brought on by  encounters with trauma-related situations. Target Date: 05/16/2024Frequency: Weekly Progress: 0 Modality: individual Related Interventions 1. Teach the client a guided self-dialogue procedure in which he/she learns to recognize  maladaptive self-talk, challenges its biases, copes with engendered feelings, overcomes  avoidance, and reinforces his/her accomplishments; review and reinforce progress, problemsolve obstacles. 4. No longer experiences intrusive event recollections, avoidance of event  reminders, intense arousal, or disinterest in activities or  relationships. 5. Thinks about or openly discusses the traumatic event with others  without experiencing psychological or physiological distress. Diagnosis:  Adjustment Disorder with mixed anxiety and depression Conditions For Discharge Achievement of treatment goals and objectives  Jared Alvarez [employer], LCSW                  Jared Alvarez Jared Wildes, LCSW               Jared Alvarez Jared Gibbon, LCSW               Jared Alvarez Jared Klee, LCSW               Jared Stockburger [employer], LCSW               Abhishek Levesque [employer], LCSW               Brandilyn Nanninga [employer], LCSW               Malakhi Markwood [employer], LCSW               Sheilla Maris Alvarez Margues Filippini, LCSW               Lacole Komorowski [place], LCSW

## 2021-12-01 ENCOUNTER — Ambulatory Visit: Payer: Medicare Other | Admitting: Psychology

## 2021-12-03 ENCOUNTER — Ambulatory Visit: Payer: Medicare Other | Admitting: Psychology

## 2021-12-09 ENCOUNTER — Ambulatory Visit (INDEPENDENT_AMBULATORY_CARE_PROVIDER_SITE_OTHER): Payer: Medicare Other | Admitting: Psychology

## 2021-12-09 DIAGNOSIS — Z630 Problems in relationship with spouse or partner: Secondary | ICD-10-CM | POA: Diagnosis not present

## 2021-12-09 DIAGNOSIS — R2689 Other abnormalities of gait and mobility: Secondary | ICD-10-CM | POA: Diagnosis not present

## 2021-12-09 DIAGNOSIS — M2142 Flat foot [pes planus] (acquired), left foot: Secondary | ICD-10-CM | POA: Diagnosis not present

## 2021-12-09 DIAGNOSIS — F4310 Post-traumatic stress disorder, unspecified: Secondary | ICD-10-CM

## 2021-12-09 DIAGNOSIS — F4323 Adjustment disorder with mixed anxiety and depressed mood: Secondary | ICD-10-CM | POA: Diagnosis not present

## 2021-12-09 DIAGNOSIS — M79671 Pain in right foot: Secondary | ICD-10-CM | POA: Diagnosis not present

## 2021-12-09 DIAGNOSIS — M79672 Pain in left foot: Secondary | ICD-10-CM | POA: Diagnosis not present

## 2021-12-10 NOTE — Progress Notes (Signed)
[facility] Counselor/Therapist Progress Note  Patient ID: Jared Alvarez, MRN: [id number],    Date: 12/09/2021  Time Spent: 60 minutes  Treatment Type: Individual Therapy  Reported Symptoms: The patient attended a face-to-face individual therapy session in the office today.  The patient reports having feelings of insecurity and depression.  Mental Status Exam: Appearance:  Casual     Behavior: Appropriate  Motor: Normal  Speech/Language:  Normal Rate  Affect: Blunt  Mood: pleasant  Thought process: normal  Thought content:   WNL  Sensory/Perceptual disturbances:   WNL  Orientation: oriented to person, place, time/date, and situation  Attention: Good  Concentration: Good  Memory: WNL  Fund of knowledge:  Good  Insight:   Good  Judgment:  Good  Impulse Control: Good   Risk Assessment: Danger to Self:  No Self-injurious Behavior: No Danger to Others: No Duty to Warn:no Physical Aggression / Violence:No  Access to Firearms a concern: No  Gang Involvement:No   Subjective: The patient attended a face-to-face individual therapy session in the office today.  The patient presents with a blunted affect and mood is pleasant.  The patient reports that he had done relatively well after our last session with not reacting as much to his wife.  He did report that he had an incident over the weekend where they got into an argument.  We talked about the dynamic that is going on between the 2 of them and it seems that she is the aggressor and he basically backs down.  He described himself as being a wimp.  We discussed doing EMDR at our next session on "I am a wimp".  We will identify specifically positive cognition that we would like to address at our next session. Interventions: Cognitive Behavioral Therapy, Assertiveness/Communication, and Insight-Oriented  Diagnosis:PTSD (post-traumatic stress disorder)  Adjustment disorder with mixed anxiety and depressed  mood  Marital conflict  Plan: Client Abilities/Strengths  Insightful, motivated, supportive husband  Client Treatment Preferences  Outpatient Individual therapy/EMDR  Client Statement of Needs  "I am having nightmares and my therapist wondered if EMDR would be helpful"  Treatment Level  Outpatient Individual therapy  Symptoms  Demonstrates an exaggerated startle response.: No Description Entered (Status: improved). Depressed  or irritable mood.:  Displays a  significant decline in interest and engagement in activities.: (Status: maintained). Displays significant psychological and/or physiological distress resulting from internal and external  clues that are reminiscent of the traumatic event.:(Status: maintained).  Experiences disturbances in sleep.: (Status: maintained). Experiences disturbing  and persistent thoughts, images, and/or perceptions of the traumatic event.:  (Status:maintained). Experiences frequent nightmares.: (Status: improved).  Feelings of hopelessness, worthlessness, or inappropriate guilt.: (Status:  improved). Has been exposed to a traumatic event involving actual or perceived threat of death or  serious injury.:  (Status: maintained). Impairment in social, occupational, or  other areas of functioning.: . Intentionally avoids activities,  places, people, or objects (e.g., up-armored vehicles) that evoke memories of the event.: (Status: improved). Reports difficulty concentrating as  well as feelings of guilt.:  Problems Addressed  Goals 1. Develop healthy thinking patterns and beliefs about self, others, and the world that lead to the alleviation and help prevent the relapse of  depression. Objective Identify and replace thoughts and beliefs that support depression. Target Date:09/17/2022 Frequency: Weekly Progress: 30 Modality: individual Related Interventions 1. Explore and restructure underlying assumptions and beliefs reflected in biased self-talk that   may put the client at risk for relapse or recurrence. 2. Conduct Cognitive-Behavioral  Therapy (see Cognitive Behavior Therapy by Olevia Bowens; Overcoming Depression by Lynita Lombard al.), beginning with helping the client learn the connection among  cognition, depressive feelings, and actions. 2. Eliminate or reduce the negative impact trauma related symptoms have  on social, occupational, and family functioning. Objective Learn and implement personal skills to manage challenging situations related to trauma. Target Date: 09/17/2022 Frequency: Weekly Progress: 0 Modality: individual 3. No longer avoids persons, places, activities, and objects that are  reminiscent of the traumatic event. Objective Participate in Eye Movement Desensitization and Reprocessing (EMDR) to reduce emotional distress  related to traumatic thoughts, feelings, and images. Target Date: 09/17/2022 Frequency: Weekly Progress: 10 Modality: individual   Related Interventions 1. Utilize Eye Movement Desensitization and Reprocessing (EMDR) to reduce the client's  emotional reactivity to the traumatic event and reduce PTSD symptoms. Objective Learn and implement guided self-dialogue to manage thoughts, feelings, and urges brought on by  encounters with trauma-related situations. Target Date: 05/16/2024Frequency: Weekly Progress: 0 Modality: individual Related Interventions 1. Teach the client a guided self-dialogue procedure in which he/she learns to recognize  maladaptive self-talk, challenges its biases, copes with engendered feelings, overcomes  avoidance, and reinforces his/her accomplishments; review and reinforce progress, problemsolve obstacles. 4. No longer experiences intrusive event recollections, avoidance of event  reminders, intense arousal, or disinterest in activities or  relationships. 5. Thinks about or openly discusses the traumatic event with others  without experiencing psychological or physiological  distress. Diagnosis:  Adjustment Disorder with mixed anxiety and depression Conditions For Discharge Achievement of treatment goals and objectives  Belinda Bringhurst [employer], LCSW                  Jessica Seidman G Calyn Rubi, LCSW               Stevi Hollinshead G Bertie Simien, LCSW               Makhi Muzquiz G Alexandr Yaworski, LCSW               Ayliana Casciano [employer], LCSW               Emmarae Cowdery [employer], LCSW               Rhylee Nunn [employer], LCSW               Chelci Wintermute [employer], LCSW               Estefanny Moler [employer], LCSW               Steph Cheadle G Zacharias Ridling, LCSW               Cashmere Harmes Gann, LCSW

## 2021-12-11 DIAGNOSIS — M19072 Primary osteoarthritis, left ankle and foot: Secondary | ICD-10-CM | POA: Diagnosis not present

## 2021-12-11 DIAGNOSIS — Z981 Arthrodesis status: Secondary | ICD-10-CM | POA: Diagnosis not present

## 2021-12-11 DIAGNOSIS — M19071 Primary osteoarthritis, right ankle and foot: Secondary | ICD-10-CM | POA: Diagnosis not present

## 2021-12-15 ENCOUNTER — Ambulatory Visit: Payer: Medicare Other | Admitting: Psychology

## 2021-12-16 ENCOUNTER — Ambulatory Visit (INDEPENDENT_AMBULATORY_CARE_PROVIDER_SITE_OTHER): Payer: Medicare Other | Admitting: Psychology

## 2021-12-16 DIAGNOSIS — Z630 Problems in relationship with spouse or partner: Secondary | ICD-10-CM

## 2021-12-16 DIAGNOSIS — F4310 Post-traumatic stress disorder, unspecified: Secondary | ICD-10-CM

## 2021-12-16 DIAGNOSIS — F4323 Adjustment disorder with mixed anxiety and depressed mood: Secondary | ICD-10-CM | POA: Diagnosis not present

## 2021-12-17 NOTE — Progress Notes (Signed)
[place] [facility] Counselor/Therapist Progress Note  Patient ID: Jared Alvarez, MRN: [id number],    Date: 0815/2023  Time Spent: 60 minutes  Treatment Type: Individual Therapy  Reported Symptoms: The patient attended a face-to-face individual therapy session in the office today.  The patient reports having feelings of insecurity and depression.  Mental Status Exam: Appearance:  Casual     Behavior: Appropriate  Motor: Normal  Speech/Language:  Normal Rate  Affect: Blunt  Mood: pleasant  Thought process: normal  Thought content:   WNL  Sensory/Perceptual disturbances:   WNL  Orientation: oriented to person, place, time/date, and situation  Attention: Good  Concentration: Good  Memory: WNL  Fund of knowledge:  Good  Insight:   Good  Judgment:  Good  Impulse Control: Good   Risk Assessment: Danger to Self:  No Self-injurious Behavior: No Danger to Others: No Duty to Warn:no Physical Aggression / Violence:No  Access to Firearms a concern: No  Gang Involvement:No   Subjective: The patient attended a face-to-face individual therapy session in the office today.  The patient presents with a blunted affect and mood is pleasant.  The patient reports that his wife has been out of town for the last several days.  The patient has not been as reactive because his wife has not been in town.  Today we went ahead and did the EMDR surrounding I am a wimp.  We installed I love you it is okay.  The patient reported feeling much better after we did the EMDR and much more calm.  We talked about the importance of him being able to be responsive as opposed to reactive.  We discussed where the reactivity comes from and we talked about him using cognitive tools to help himself by himself some time as opposed to reacting in an argument.  The patient understood the concepts discussed and we will continue to do EMDR during the next session.  Interventions: Cognitive Behavioral Therapy,  Assertiveness/Communication, and Insight-Oriented, EMDR  Diagnosis:PTSD (post-traumatic stress disorder)  Adjustment disorder with mixed anxiety and depressed mood  Marital conflict  Plan: Client Abilities/Strengths  Insightful, motivated, supportive husband  Client Treatment Preferences  Outpatient Individual therapy/EMDR  Client Statement of Needs  "I am having nightmares and my therapist wondered if EMDR would be helpful"  Treatment Level  Outpatient Individual therapy  Symptoms  Demonstrates an exaggerated startle response.: No Description Entered (Status: improved). Depressed  or irritable mood.:  Displays a  significant decline in interest and engagement in activities.: (Status: maintained). Displays significant psychological and/or physiological distress resulting from internal and external  clues that are reminiscent of the traumatic event.:(Status: maintained).  Experiences disturbances in sleep.: (Status: maintained). Experiences disturbing  and persistent thoughts, images, and/or perceptions of the traumatic event.:  (Status:maintained). Experiences frequent nightmares.: (Status: improved).  Feelings of hopelessness, worthlessness, or inappropriate guilt.: (Status:  improved). Has been exposed to a traumatic event involving actual or perceived threat of death or  serious injury.:  (Status: maintained). Impairment in social, occupational, or  other areas of functioning.: . Intentionally avoids activities,  places, people, or objects (e.g., up-armored vehicles) that evoke memories of the event.: (Status: improved). Reports difficulty concentrating as  well as feelings of guilt.:  Problems Addressed  Goals 1. Develop healthy thinking patterns and beliefs about self, others, and the world that lead to the alleviation and help prevent the relapse of  depression. Objective Identify and replace thoughts and beliefs that support depression. Target Date:09/17/2022 Frequency:  Weekly Progress:  30 Modality: individual Related Interventions 1. Explore and restructure underlying assumptions and beliefs reflected in biased self-talk that  may put the client at risk for relapse or recurrence. 2. Conduct Cognitive-Behavioral Therapy (see Cognitive Behavior Therapy by Olevia Bowens; Overcoming Depression by Lynita Lombard al.), beginning with helping the client learn the connection among  cognition, depressive feelings, and actions. 2. Eliminate or reduce the negative impact trauma related symptoms have  on social, occupational, and family functioning. Objective Learn and implement personal skills to manage challenging situations related to trauma. Target Date: 09/17/2022 Frequency: Weekly Progress: 0 Modality: individual 3. No longer avoids persons, places, activities, and objects that are  reminiscent of the traumatic event. Objective Participate in Eye Movement Desensitization and Reprocessing (EMDR) to reduce emotional distress  related to traumatic thoughts, feelings, and images. Target Date: 09/17/2022 Frequency: Weekly Progress: 10 Modality: individual   Related Interventions 1. Utilize Eye Movement Desensitization and Reprocessing (EMDR) to reduce the client's  emotional reactivity to the traumatic event and reduce PTSD symptoms. Objective Learn and implement guided self-dialogue to manage thoughts, feelings, and urges brought on by  encounters with trauma-related situations. Target Date: 05/16/2024Frequency: Weekly Progress: 0 Modality: individual Related Interventions 1. Teach the client a guided self-dialogue procedure in which he/she learns to recognize  maladaptive self-talk, challenges its biases, copes with engendered feelings, overcomes  avoidance, and reinforces his/her accomplishments; review and reinforce progress, problemsolve obstacles. 4. No longer experiences intrusive event recollections, avoidance of event  reminders, intense arousal, or  disinterest in activities or  relationships. 5. Thinks about or openly discusses the traumatic event with others  without experiencing psychological or physiological distress. Diagnosis:  Adjustment Disorder with mixed anxiety and depression Conditions For Discharge Achievement of treatment goals and objectives  Seylah Wernert [employer], LCSW                  Michiel Sivley G Jasun Gasparini, LCSW               Milaina Sher G Ashyra Cantin, LCSW               Kylena Mole G Lorilei Horan, LCSW               Krish Bailly [employer], LCSW               Carol Loftin [employer], LCSW               Nyree Yonker [employer], LCSW               Jovante Hammitt [employer], LCSW               Aphrodite Harpenau [employer], LCSW               Tavaris Eudy [employer], LCSW               Lenardo Westwood G Antwain Caliendo, LCSW               Miki Labuda [employer], LCSW

## 2021-12-23 ENCOUNTER — Ambulatory Visit (INDEPENDENT_AMBULATORY_CARE_PROVIDER_SITE_OTHER): Payer: Medicare Other | Admitting: Psychology

## 2021-12-23 DIAGNOSIS — Z630 Problems in relationship with spouse or partner: Secondary | ICD-10-CM

## 2021-12-23 DIAGNOSIS — F4323 Adjustment disorder with mixed anxiety and depressed mood: Secondary | ICD-10-CM | POA: Diagnosis not present

## 2021-12-23 DIAGNOSIS — F4310 Post-traumatic stress disorder, unspecified: Secondary | ICD-10-CM | POA: Diagnosis not present

## 2021-12-23 NOTE — Progress Notes (Signed)
[facility] Counselor/Therapist Progress Note  Patient ID: Jared Alvarez, MRN: [id number],    Date: 12/23/2021  Time Spent: 60 minutes  Treatment Type: Individual Therapy  Reported Symptoms: The patient attended a face-to-face individual therapy session in the office today.  The patient reports having feelings of insecurity and depression.  Mental Status Exam: Appearance:  Casual     Behavior: Appropriate  Motor: Normal  Speech/Language:  Normal Rate  Affect: Blunt  Mood: pleasant  Thought process: normal  Thought content:   WNL  Sensory/Perceptual disturbances:   WNL  Orientation: oriented to person, place, time/date, and situation  Attention: Good  Concentration: Good  Memory: WNL  Fund of knowledge:  Good  Insight:   Good  Judgment:  Good  Impulse Control: Good   Risk Assessment: Danger to Self:  No Self-injurious Behavior: No Danger to Others: No Duty to Warn:no Physical Aggression / Violence:No  Access to Firearms a concern: No  Gang Involvement:No   Subjective: The patient attended a face-to-face individual therapy session in the office today.  The patient presents with a blunted affect and mood is pleasant.  Today the patient came in and reports that he feels like the EMDR is working.  He is able to recognize that he is feeling a little more secure with his beliefs about himself.  He talked about his wife coming back from [place] today and he is feeling anxious about this.  He talked about being frustrated because she gives him mixed messages and he feels like she is very argumentative with him.  The patient is not sure whether he wants to necessarily stay in the marriage just because it is so difficult to interact with her.  I recommended that he give some thought to what he actually wants to do.  I also told him that he is not he may have to have a confrontation with her about her getting some therapy if things do not get better in the  relationship.  The patient has essentially dealt with this for quite some time by being submissive and thinking that something is wrong with him.  I encouraged him to do some soul searching to see whether he felt this is what he wants to do to try to make the marriage work or if he just would prefer to get out of it in some way. Interventions: Cognitive Behavioral Therapy, Assertiveness/Communication, and Insight-Oriented, EMDR  Diagnosis:PTSD (post-traumatic stress disorder)  Adjustment disorder with mixed anxiety and depressed mood  Marital conflict  Plan: Client Abilities/Strengths  Insightful, motivated, supportive husband  Client Treatment Preferences  Outpatient Individual therapy/EMDR  Client Statement of Needs  "I am having nightmares and my therapist wondered if EMDR would be helpful"  Treatment Level  Outpatient Individual therapy  Symptoms  Demonstrates an exaggerated startle response.: No Description Entered (Status: improved). Depressed  or irritable mood.:  Displays a  significant decline in interest and engagement in activities.: (Status: maintained). Displays significant psychological and/or physiological distress resulting from internal and external  clues that are reminiscent of the traumatic event.:(Status: maintained).  Experiences disturbances in sleep.: (Status: maintained). Experiences disturbing  and persistent thoughts, images, and/or perceptions of the traumatic event.:  (Status:maintained). Experiences frequent nightmares.: (Status: improved).  Feelings of hopelessness, worthlessness, or inappropriate guilt.: (Status:  improved). Has been exposed to a traumatic event involving actual or perceived threat of death or  serious injury.:  (Status: maintained). Impairment in social, occupational, or  other areas of functioning.: . Intentionally avoids  activities,  places, people, or objects (e.g., up-armored vehicles) that evoke memories of the event.: (Status:  improved). Reports difficulty concentrating as  well as feelings of guilt.:  Problems Addressed  Goals 1. Develop healthy thinking patterns and beliefs about self, others, and the world that lead to the alleviation and help prevent the relapse of  depression. Objective Identify and replace thoughts and beliefs that support depression. Target Date:09/17/2022 Frequency: Weekly Progress: 40 Modality: individual Related Interventions 1. Explore and restructure underlying assumptions and beliefs reflected in biased self-talk that  may put the client at risk for relapse or recurrence. 2. Conduct Cognitive-Behavioral Therapy (see Cognitive Behavior Therapy by Olevia Bowens; Overcoming Depression by Lynita Lombard al.), beginning with helping the client learn the connection among  cognition, depressive feelings, and actions. 2. Eliminate or reduce the negative impact trauma related symptoms have  on social, occupational, and family functioning. Objective Learn and implement personal skills to manage challenging situations related to trauma. Target Date: 09/17/2022 Frequency: Weekly Progress: 10 Modality: individual 3. No longer avoids persons, places, activities, and objects that are  reminiscent of the traumatic event. Objective Participate in Eye Movement Desensitization and Reprocessing (EMDR) to reduce emotional distress  related to traumatic thoughts, feelings, and images. Target Date: 09/17/2022 Frequency: Weekly Progress: 10 Modality: individual   Related Interventions 1. Utilize Eye Movement Desensitization and Reprocessing (EMDR) to reduce the client's  emotional reactivity to the traumatic event and reduce PTSD symptoms. Objective Learn and implement guided self-dialogue to manage thoughts, feelings, and urges brought on by  encounters with trauma-related situations. Target Date: 05/16/2024Frequency: Weekly Progress: 10 Modality: individual Related Interventions 1. Teach the client a  guided self-dialogue procedure in which he/she learns to recognize  maladaptive self-talk, challenges its biases, copes with engendered feelings, overcomes  avoidance, and reinforces his/her accomplishments; review and reinforce progress, problemsolve obstacles. 4. No longer experiences intrusive event recollections, avoidance of event  reminders, intense arousal, or disinterest in activities or  relationships. 5. Thinks about or openly discusses the traumatic event with others  without experiencing psychological or physiological distress. Diagnosis:  Adjustment Disorder with mixed anxiety and depression Conditions For Discharge Achievement of treatment goals and objectives  Sya Nestler [employer], LCSW                  Berneda Piccininni G Terianne Thaker, LCSW               Obera Stauch G Rishith Siddoway, LCSW               Kyon Bentler G Pegge Cumberledge, LCSW               Shadavia Dampier [employer], LCSW               Leoda Smithhart [employer], LCSW               Estalene Bergey [employer], LCSW               Annjanette Wertenberger [employer], LCSW               Delio Slates [employer], LCSW               Lossie Kalp [employer], LCSW               Teckla Christiansen [employer], LCSW               Da Authement G Ariannie Penaloza, LCSW               Keria Widrig [employer], LCSW

## 2021-12-29 ENCOUNTER — Ambulatory Visit: Payer: Medicare Other | Admitting: Psychology

## 2021-12-30 ENCOUNTER — Ambulatory Visit (INDEPENDENT_AMBULATORY_CARE_PROVIDER_SITE_OTHER): Payer: Medicare Other | Admitting: Psychology

## 2021-12-30 DIAGNOSIS — F4310 Post-traumatic stress disorder, unspecified: Secondary | ICD-10-CM | POA: Diagnosis not present

## 2021-12-30 DIAGNOSIS — F4323 Adjustment disorder with mixed anxiety and depressed mood: Secondary | ICD-10-CM

## 2021-12-30 DIAGNOSIS — Z630 Problems in relationship with spouse or partner: Secondary | ICD-10-CM | POA: Diagnosis not present

## 2021-12-30 NOTE — Addendum Note (Signed)
Addended by: Johny Blamer on: 12/30/2021 04:52 PM   Modules accepted: Level of Service

## 2021-12-30 NOTE — Progress Notes (Signed)
[facility] Counselor/Therapist Progress Note  Patient ID: Jared Alvarez, MRN: [id number],    Date: 12/30/2021  Time Spent: 60 minutes  Treatment Type: Individual Therapy  Reported Symptoms: The patient attended a face-to-face individual therapy session in the office today.  The patient reports having feelings of insecurity and depression.  Mental Status Exam: Appearance:  Casual     Behavior: Appropriate  Motor: Normal  Speech/Language:  Normal Rate  Affect: Blunt  Mood: pleasant  Thought process: normal  Thought content:   WNL  Sensory/Perceptual disturbances:   WNL  Orientation: oriented to person, place, time/date, and situation  Attention: Good  Concentration: Good  Memory: WNL  Fund of knowledge:  Good  Insight:   Good  Judgment:  Good  Impulse Control: Good   Risk Assessment: Danger to Self:  No Self-injurious Behavior: No Danger to Others: No Duty to Warn:no Physical Aggression / Violence:No  Access to Firearms a concern: No  Gang Involvement:No   Subjective: The patient attended a face-to-face individual therapy session in the office today.  The patient presents with a blunted affect and mood is pleasant.  The patient reports that his wife came back and seems to be doing a little better with their interactions.  The patient states that he is not sure about what is happening but he feels that he may be responding differently.  I asked him to think about whether it is the EMDR that is made him feel a little bit different about how he is responding.  The patient reports that he is not really doing what he wants to do.  He does not say that he wants to get out of his marriage but he is saying that he wants something different in his life.  He feels that he will get pushed back from his wife if he verbalizes this.  We talked about him writing down what it is that he would like to be different and what he would want to do different.  He is supposed to do  this by the next session.  Interventions: Cognitive Behavioral Therapy, Assertiveness/Communication, and Insight-Oriented, EMDR  Diagnosis:PTSD (post-traumatic stress disorder)  Adjustment disorder with mixed anxiety and depressed mood  Marital conflict  Plan: Client Abilities/Strengths  Insightful, motivated, supportive husband  Client Treatment Preferences  Outpatient Individual therapy/EMDR  Client Statement of Needs  "I am having nightmares and my therapist wondered if EMDR would be helpful"  Treatment Level  Outpatient Individual therapy  Symptoms  Demonstrates an exaggerated startle response.: No Description Entered (Status: improved). Depressed  or irritable mood.:  Displays a  significant decline in interest and engagement in activities.: (Status: maintained). Displays significant psychological and/or physiological distress resulting from internal and external  clues that are reminiscent of the traumatic event.:(Status: maintained).  Experiences disturbances in sleep.: (Status: maintained). Experiences disturbing  and persistent thoughts, images, and/or perceptions of the traumatic event.:  (Status:maintained). Experiences frequent nightmares.: (Status: improved).  Feelings of hopelessness, worthlessness, or inappropriate guilt.: (Status:  improved). Has been exposed to a traumatic event involving actual or perceived threat of death or  serious injury.:  (Status: maintained). Impairment in social, occupational, or  other areas of functioning.: . Intentionally avoids activities,  places, people, or objects (e.g., up-armored vehicles) that evoke memories of the event.: (Status: improved). Reports difficulty concentrating as  well as feelings of guilt.:  Problems Addressed  Goals 1. Develop healthy thinking patterns and beliefs about self, others, and the world that lead to the alleviation  and help prevent the relapse of  depression. Objective Identify and replace thoughts  and beliefs that support depression. Target Date:09/17/2022 Frequency: Weekly Progress: 40 Modality: individual Related Interventions 1. Explore and restructure underlying assumptions and beliefs reflected in biased self-talk that  may put the client at risk for relapse or recurrence. 2. Conduct Cognitive-Behavioral Therapy (see Cognitive Behavior Therapy by Olevia Bowens; Overcoming Depression by Lynita Lombard al.), beginning with helping the client learn the connection among  cognition, depressive feelings, and actions. 2. Eliminate or reduce the negative impact trauma related symptoms have  on social, occupational, and family functioning. Objective Learn and implement personal skills to manage challenging situations related to trauma. Target Date: 09/17/2022 Frequency: Weekly Progress: 10 Modality: individual 3. No longer avoids persons, places, activities, and objects that are  reminiscent of the traumatic event. Objective Participate in Eye Movement Desensitization and Reprocessing (EMDR) to reduce emotional distress  related to traumatic thoughts, feelings, and images. Target Date: 09/17/2022 Frequency: Weekly Progress: 20 Modality: individual   Related Interventions 1. Utilize Eye Movement Desensitization and Reprocessing (EMDR) to reduce the client's  emotional reactivity to the traumatic event and reduce PTSD symptoms. Objective Learn and implement guided self-dialogue to manage thoughts, feelings, and urges brought on by  encounters with trauma-related situations. Target Date: 05/16/2024Frequency: Weekly Progress: 20 Modality: individual Related Interventions 1. Teach the client a guided self-dialogue procedure in which he/she learns to recognize  maladaptive self-talk, challenges its biases, copes with engendered feelings, overcomes  avoidance, and reinforces his/her accomplishments; review and reinforce progress, problemsolve obstacles. 4. No longer experiences intrusive event  recollections, avoidance of event  reminders, intense arousal, or disinterest in activities or  relationships. 5. Thinks about or openly discusses the traumatic event with others  without experiencing psychological or physiological distress. Diagnosis:  Adjustment Disorder with mixed anxiety and depression Conditions For Discharge Achievement of treatment goals and objectives  Tayvia Faughnan [employer], LCSW                  Zhoe Catania G Zeanna Sunde, LCSW               Jaidah Lomax G Marlina Cataldi, LCSW               Edris Friedt G Unnamed Hino, LCSW               Ab Leaming [employer], LCSW               Inaara Tye [employer], LCSW               Everton Bertha [employer], LCSW               Oron Westrup [employer], LCSW               Khiem Gargis [employer], LCSW               Jariana Shumard [employer], LCSW               Dayan Kreis [employer], LCSW               Britny Riel [employer], LCSW               Santonio Speakman G Anayiah Howden, LCSW               Aalyssa Elderkin [place], LCSW

## 2022-01-06 ENCOUNTER — Ambulatory Visit (INDEPENDENT_AMBULATORY_CARE_PROVIDER_SITE_OTHER): Payer: Medicare Other | Admitting: Psychology

## 2022-01-06 DIAGNOSIS — M2142 Flat foot [pes planus] (acquired), left foot: Secondary | ICD-10-CM | POA: Diagnosis not present

## 2022-01-06 DIAGNOSIS — M79671 Pain in right foot: Secondary | ICD-10-CM | POA: Diagnosis not present

## 2022-01-06 DIAGNOSIS — F4310 Post-traumatic stress disorder, unspecified: Secondary | ICD-10-CM

## 2022-01-06 DIAGNOSIS — M79672 Pain in left foot: Secondary | ICD-10-CM | POA: Diagnosis not present

## 2022-01-06 DIAGNOSIS — R2689 Other abnormalities of gait and mobility: Secondary | ICD-10-CM | POA: Diagnosis not present

## 2022-01-06 DIAGNOSIS — Z630 Problems in relationship with spouse or partner: Secondary | ICD-10-CM | POA: Diagnosis not present

## 2022-01-06 DIAGNOSIS — F4323 Adjustment disorder with mixed anxiety and depressed mood: Secondary | ICD-10-CM | POA: Diagnosis not present

## 2022-01-06 NOTE — Progress Notes (Signed)
[facility] Counselor/Therapist Progress Note  Patient ID: LAVONE WEISEL, MRN: [id number],    Date: 01/06/2022  Time Spent: 60 minutes  Treatment Type: Individual Therapy  Reported Symptoms: The patient attended a face-to-face individual therapy session in the office today.  The patient reports having feelings of insecurity and depression.  Mental Status Exam: Appearance:  Casual     Behavior: Appropriate  Motor: Normal  Speech/Language:  Normal Rate  Affect: Blunt  Mood: pleasant  Thought process: normal  Thought content:   WNL  Sensory/Perceptual disturbances:   WNL  Orientation: oriented to person, place, time/date, and situation  Attention: Good  Concentration: Good  Memory: WNL  Fund of knowledge:  Good  Insight:   Good  Judgment:  Good  Impulse Control: Good   Risk Assessment: Danger to Self:  No Self-injurious Behavior: No Danger to Others: No Duty to Warn:no Physical Aggression / Violence:No  Access to Firearms a concern: No  Gang Involvement:No   Subjective: The patient attended a face-to-face individual therapy session in the office today.  The patient presents with a blunted affect and mood is anxious.  The patient reports that he worked on his assignment of writing down what he wants.  He had a list of several things that sound like he is not being able to do those things now.  We talked about the need for him to have a conversation with his wife as that seems to be where the conflict lies.  The patient has avoided having any kind of conflict with her over several years.  We talked about the importance of learning how to do confrontation.  And practiced ways to have conversation with her.  I recommended that he write down how he was going to move forward with having a conversation with his wife.  We will continue to discuss this and work towards him doing this relatively soon.  The avoidance is causing him to lose himself and we need for him to be  able to do some of the things that he really wants to do in his life that are very reasonable. Interventions: Cognitive Behavioral Therapy, Assertiveness/Communication, and Insight-Oriented, EMDR  Diagnosis:PTSD (post-traumatic stress disorder)  Adjustment disorder with mixed anxiety and depressed mood  Marital conflict  Plan: Client Abilities/Strengths  Insightful, motivated, supportive husband  Client Treatment Preferences  Outpatient Individual therapy/EMDR  Client Statement of Needs  "I am having nightmares and my therapist wondered if EMDR would be helpful"  Treatment Level  Outpatient Individual therapy  Symptoms  Demonstrates an exaggerated startle response.: No Description Entered (Status: improved). Depressed  or irritable mood.:  Displays a  significant decline in interest and engagement in activities.: (Status: maintained). Displays significant psychological and/or physiological distress resulting from internal and external  clues that are reminiscent of the traumatic event.:(Status: maintained).  Experiences disturbances in sleep.: (Status: maintained). Experiences disturbing  and persistent thoughts, images, and/or perceptions of the traumatic event.:  (Status:maintained). Experiences frequent nightmares.: (Status: improved).  Feelings of hopelessness, worthlessness, or inappropriate guilt.: (Status:  improved). Has been exposed to a traumatic event involving actual or perceived threat of death or  serious injury.:  (Status: maintained). Impairment in social, occupational, or  other areas of functioning.: . Intentionally avoids activities,  places, people, or objects (e.g., up-armored vehicles) that evoke memories of the event.: (Status: improved). Reports difficulty concentrating as  well as feelings of guilt.:  Problems Addressed  Goals 1. Develop healthy thinking patterns and beliefs about self, others, and  the world that lead to the alleviation and help prevent the  relapse of  depression. Objective Identify and replace thoughts and beliefs that support depression. Target Date:09/17/2022 Frequency: Weekly Progress: 40 Modality: individual Related Interventions 1. Explore and restructure underlying assumptions and beliefs reflected in biased self-talk that  may put the client at risk for relapse or recurrence. 2. Conduct Cognitive-Behavioral Therapy (see Cognitive Behavior Therapy by Olevia Bowens; Overcoming Depression by Lynita Lombard al.), beginning with helping the client learn the connection among  cognition, depressive feelings, and actions. 2. Eliminate or reduce the negative impact trauma related symptoms have  on social, occupational, and family functioning. Objective Learn and implement personal skills to manage challenging situations related to trauma. Target Date: 09/17/2022 Frequency: Weekly Progress: 10 Modality: individual 3. No longer avoids persons, places, activities, and objects that are  reminiscent of the traumatic event. Objective Participate in Eye Movement Desensitization and Reprocessing (EMDR) to reduce emotional distress  related to traumatic thoughts, feelings, and images. Target Date: 09/17/2022 Frequency: Weekly Progress: 20 Modality: individual   Related Interventions 1. Utilize Eye Movement Desensitization and Reprocessing (EMDR) to reduce the client's  emotional reactivity to the traumatic event and reduce PTSD symptoms. Objective Learn and implement guided self-dialogue to manage thoughts, feelings, and urges brought on by  encounters with trauma-related situations. Target Date: 05/16/2024Frequency: Weekly Progress: 20 Modality: individual Related Interventions 1. Teach the client a guided self-dialogue procedure in which he/she learns to recognize  maladaptive self-talk, challenges its biases, copes with engendered feelings, overcomes  avoidance, and reinforces his/her accomplishments; review and reinforce progress,  problemsolve obstacles. 4. No longer experiences intrusive event recollections, avoidance of event  reminders, intense arousal, or disinterest in activities or  relationships. 5. Thinks about or openly discusses the traumatic event with others  without experiencing psychological or physiological distress. Diagnosis:  Adjustment Disorder with mixed anxiety and depression Conditions For Discharge Achievement of treatment goals and objectives  Charene Mccallister G Meika Earll, LCSW                                                                                                                                                                                                  Alaysha Jefcoat G Belvin Gauss, LCSW               Steward Sames G Braidon [place], LCSW

## 2022-01-12 ENCOUNTER — Ambulatory Visit: Payer: Medicare Other | Admitting: Psychology

## 2022-01-14 ENCOUNTER — Ambulatory Visit (INDEPENDENT_AMBULATORY_CARE_PROVIDER_SITE_OTHER): Payer: Medicare Other | Admitting: Psychology

## 2022-01-14 DIAGNOSIS — F4323 Adjustment disorder with mixed anxiety and depressed mood: Secondary | ICD-10-CM

## 2022-01-14 DIAGNOSIS — F4310 Post-traumatic stress disorder, unspecified: Secondary | ICD-10-CM | POA: Diagnosis not present

## 2022-01-14 DIAGNOSIS — Z630 Problems in relationship with spouse or partner: Secondary | ICD-10-CM | POA: Diagnosis not present

## 2022-01-15 NOTE — Progress Notes (Signed)
[facility] Counselor/Therapist Progress Note  Patient ID: SALIL RAINERI, MRN: [id number],    Date: 01/14/2022  Time Spent: 60 minutes  Treatment Type: Individual Therapy  Reported Symptoms: The patient attended a face-to-face individual therapy session in the office today.  The patient reports having feelings of insecurity and depression.  Mental Status Exam: Appearance:  Casual     Behavior: Appropriate  Motor: Normal  Speech/Language:  Normal Rate  Affect: Blunt  Mood: pleasant  Thought process: normal  Thought content:   WNL  Sensory/Perceptual disturbances:   WNL  Orientation: oriented to person, place, time/date, and situation  Attention: Good  Concentration: Good  Memory: WNL  Fund of knowledge:  Good  Insight:   Good  Judgment:  Good  Impulse Control: Good   Risk Assessment: Danger to Self:  No Self-injurious Behavior: No Danger to Others: No Duty to Warn:no Physical Aggression / Violence:No  Access to Firearms a concern: No  Gang Involvement:No   Subjective: The patient attended a face-to-face individual therapy session in the office today.  The patient presents with a blunted affect and mood is anxious.  The patient states that he had a realization that stating what he wants is directly related to his difficulty dealing with conflict.  He states that he avoids conflict and avoids stating what he wants.  The patient was very interesting today and that he talked about a lot of things but really said not a lot of anything.  This was another way for him to avoid actually saying what he wants.  We will continue to work with the patient on trying to develop some level of security and authenticity within himself as he struggles with this because of his upbringing.  The patient has a good understanding of what he does however he does not really know how to change it.  Interventions: Cognitive Behavioral Therapy, Assertiveness/Communication, and  Insight-Oriented, EMDR  Diagnosis:PTSD (post-traumatic stress disorder)  Adjustment disorder with mixed anxiety and depressed mood  Marital conflict  Plan: Client Abilities/Strengths  Insightful, motivated, supportive husband  Client Treatment Preferences  Outpatient Individual therapy/EMDR  Client Statement of Needs  "I am having nightmares and my therapist wondered if EMDR would be helpful"  Treatment Level  Outpatient Individual therapy  Symptoms  Demonstrates an exaggerated startle response.: No Description Entered (Status: improved). Depressed  or irritable mood.:  Displays a  significant decline in interest and engagement in activities.: (Status: maintained). Displays significant psychological and/or physiological distress resulting from internal and external  clues that are reminiscent of the traumatic event.:(Status: maintained).  Experiences disturbances in sleep.: (Status: maintained). Experiences disturbing  and persistent thoughts, images, and/or perceptions of the traumatic event.:  (Status:maintained). Experiences frequent nightmares.: (Status: improved).  Feelings of hopelessness, worthlessness, or inappropriate guilt.: (Status:  improved). Has been exposed to a traumatic event involving actual or perceived threat of death or  serious injury.:  (Status: maintained). Impairment in social, occupational, or  other areas of functioning.: . Intentionally avoids activities,  places, people, or objects (e.g., up-armored vehicles) that evoke memories of the event.: (Status: improved). Reports difficulty concentrating as  well as feelings of guilt.:  Problems Addressed  Goals 1. Develop healthy thinking patterns and beliefs about self, others, and the world that lead to the alleviation and help prevent the relapse of  depression. Objective Identify and replace thoughts and beliefs that support depression. Target Date:09/17/2022 Frequency: Weekly Progress: 40 Modality:  individual Related Interventions 1. Explore and restructure underlying assumptions and  beliefs reflected in biased self-talk that  may put the client at risk for relapse or recurrence. 2. Conduct Cognitive-Behavioral Therapy (see Cognitive Behavior Therapy by Olevia Bowens; Overcoming Depression by Lynita Lombard al.), beginning with helping the client learn the connection among  cognition, depressive feelings, and actions. 2. Eliminate or reduce the negative impact trauma related symptoms have  on social, occupational, and family functioning. Objective Learn and implement personal skills to manage challenging situations related to trauma. Target Date: 09/17/2022 Frequency: Weekly Progress: 10 Modality: individual 3. No longer avoids persons, places, activities, and objects that are  reminiscent of the traumatic event. Objective Participate in Eye Movement Desensitization and Reprocessing (EMDR) to reduce emotional distress  related to traumatic thoughts, feelings, and images. Target Date: 09/17/2022 Frequency: Weekly Progress: 20 Modality: individual   Related Interventions 1. Utilize Eye Movement Desensitization and Reprocessing (EMDR) to reduce the client's  emotional reactivity to the traumatic event and reduce PTSD symptoms. Objective Learn and implement guided self-dialogue to manage thoughts, feelings, and urges brought on by  encounters with trauma-related situations. Target Date: 05/16/2024Frequency: Weekly Progress: 20 Modality: individual Related Interventions 1. Teach the client a guided self-dialogue procedure in which he/she learns to recognize  maladaptive self-talk, challenges its biases, copes with engendered feelings, overcomes  avoidance, and reinforces his/her accomplishments; review and reinforce progress, problemsolve obstacles. 4. No longer experiences intrusive event recollections, avoidance of event  reminders, intense arousal, or disinterest in activities or   relationships. 5. Thinks about or openly discusses the traumatic event with others  without experiencing psychological or physiological distress. Diagnosis:  Adjustment Disorder with mixed anxiety and depression Conditions For Discharge Achievement of treatment goals and objectives  Lynnley Doddridge G Delano Frate, LCSW                                                                                                                                                                                                                                Najae Filsaime G Lilana Blasko, LCSW

## 2022-01-21 ENCOUNTER — Ambulatory Visit (INDEPENDENT_AMBULATORY_CARE_PROVIDER_SITE_OTHER): Payer: Medicare Other | Admitting: Psychology

## 2022-01-21 DIAGNOSIS — Z630 Problems in relationship with spouse or partner: Secondary | ICD-10-CM | POA: Diagnosis not present

## 2022-01-21 DIAGNOSIS — F4310 Post-traumatic stress disorder, unspecified: Secondary | ICD-10-CM | POA: Diagnosis not present

## 2022-01-21 DIAGNOSIS — F4323 Adjustment disorder with mixed anxiety and depressed mood: Secondary | ICD-10-CM | POA: Diagnosis not present

## 2022-01-22 NOTE — Progress Notes (Signed)
[place] Counselor/Therapist Progress Note  Patient ID: Jared Alvarez, MRN: [id number],    Date: 01/21/2022  Time Spent: 60 minutes  Treatment Type: Individual Therapy  Reported Symptoms: The patient attended a face-to-face individual therapy session in the office today.  The patient reports having feelings of insecurity and depression.  Mental Status Exam: Appearance:  Casual     Behavior: Appropriate  Motor: Normal  Speech/Language:  Normal Rate  Affect: Blunt  Mood: pleasant  Thought process: normal  Thought content:   WNL  Sensory/Perceptual disturbances:   WNL  Orientation: oriented to person, place, time/date, and situation  Attention: Good  Concentration: Good  Memory: WNL  Fund of knowledge:  Good  Insight:   Good  Judgment:  Good  Impulse Control: Good   Risk Assessment: Danger to Self:  No Self-injurious Behavior: No Danger to Others: No Duty to Warn:no Physical Aggression / Violence:No  Access to Firearms a concern: No  Gang Involvement:No   Subjective: The patient attended a face-to-face individual therapy session in the office today.  The patient presents with a blunted affect and mood is anxious.  The patient reports that he and his wife had an argument this morning.  The patient talked about how he gets frustrated and backs down.  We talked about the need for him to have a conversation and confront his wife about the need for some change because neither one of them is happy.  He talked about how his wife is worried about money.  We talked about some strategies for the patient to possibly help his wife feel more secure with the money situation.  The patient reports that he can do well with being an adult except when he has conflict to deal with.  We talked more about the statement he made last week of there is a connection between him stating what he wants and conflict and he generally avoids what he wants.  I encouraged him to write down  and think about having that conversation with his wife.   Interventions: Cognitive Behavioral Therapy, Assertiveness/Communication, and Insight-Oriented, EMDR  Diagnosis:PTSD (post-traumatic stress disorder)  Adjustment disorder with mixed anxiety and depressed mood  Marital conflict  Plan: Client Abilities/Strengths  Insightful, motivated, supportive husband  Client Treatment Preferences  Outpatient Individual therapy/EMDR  Client Statement of Needs  "I am having nightmares and my therapist wondered if EMDR would be helpful"  Treatment Level  Outpatient Individual therapy  Symptoms  Demonstrates an exaggerated startle response.: No Description Entered (Status: improved). Depressed  or irritable mood.:  Displays a  significant decline in interest and engagement in activities.: (Status: maintained). Displays significant psychological and/or physiological distress resulting from internal and external  clues that are reminiscent of the traumatic event.:(Status: maintained).  Experiences disturbances in sleep.: (Status: maintained). Experiences disturbing  and persistent thoughts, images, and/or perceptions of the traumatic event.:  (Status:maintained). Experiences frequent nightmares.: (Status: improved).  Feelings of hopelessness, worthlessness, or inappropriate guilt.: (Status:  improved). Has been exposed to a traumatic event involving actual or perceived threat of death or  serious injury.:  (Status: maintained). Impairment in social, occupational, or  other areas of functioning.: . Intentionally avoids activities,  places, people, or objects (e.g., up-armored vehicles) that evoke memories of the event.: (Status: improved). Reports difficulty concentrating as  well as feelings of guilt.:  Problems Addressed  Goals 1. Develop healthy thinking patterns and beliefs about self, others, and the world that lead to the alleviation and help prevent  the relapse of   depression. Objective Identify and replace thoughts and beliefs that support depression. Target Date:09/17/2022 Frequency: Weekly Progress: 40 Modality: individual Related Interventions 1. Explore and restructure underlying assumptions and beliefs reflected in biased self-talk that  may put the client at risk for relapse or recurrence. 2. Conduct Cognitive-Behavioral Therapy (see Cognitive Behavior Therapy by Olevia Bowens; Overcoming Depression by Lynita Lombard al.), beginning with helping the client learn the connection among  cognition, depressive feelings, and actions. 2. Eliminate or reduce the negative impact trauma related symptoms have  on social, occupational, and family functioning. Objective Learn and implement personal skills to manage challenging situations related to trauma. Target Date: 09/17/2022 Frequency: Weekly Progress: 10 Modality: individual 3. No longer avoids persons, places, activities, and objects that are  reminiscent of the traumatic event. Objective Participate in Eye Movement Desensitization and Reprocessing (EMDR) to reduce emotional distress  related to traumatic thoughts, feelings, and images. Target Date: 09/17/2022 Frequency: Weekly Progress: 20 Modality: individual   Related Interventions 1. Utilize Eye Movement Desensitization and Reprocessing (EMDR) to reduce the client's  emotional reactivity to the traumatic event and reduce PTSD symptoms. Objective Learn and implement guided self-dialogue to manage thoughts, feelings, and urges brought on by  encounters with trauma-related situations. Target Date: 05/16/2024Frequency: Weekly Progress: 20 Modality: individual Related Interventions 1. Teach the client a guided self-dialogue procedure in which he/she learns to recognize  maladaptive self-talk, challenges its biases, copes with engendered feelings, overcomes  avoidance, and reinforces his/her accomplishments; review and reinforce progress, problemsolve  obstacles. 4. No longer experiences intrusive event recollections, avoidance of event  reminders, intense arousal, or disinterest in activities or  relationships. 5. Thinks about or openly discusses the traumatic event with others  without experiencing psychological or physiological distress. Diagnosis:  Adjustment Disorder with mixed anxiety and depression Conditions For Discharge Achievement of treatment goals and objectives  Kele Barthelemy G Noris Kulinski, LCSW

## 2022-01-26 ENCOUNTER — Ambulatory Visit: Payer: Medicare Other | Admitting: Psychology

## 2022-01-27 ENCOUNTER — Other Ambulatory Visit: Payer: Self-pay | Admitting: Internal Medicine

## 2022-01-27 ENCOUNTER — Ambulatory Visit (INDEPENDENT_AMBULATORY_CARE_PROVIDER_SITE_OTHER): Payer: Medicare Other | Admitting: Psychology

## 2022-01-27 DIAGNOSIS — F4323 Adjustment disorder with mixed anxiety and depressed mood: Secondary | ICD-10-CM

## 2022-01-27 DIAGNOSIS — Z630 Problems in relationship with spouse or partner: Secondary | ICD-10-CM

## 2022-01-27 DIAGNOSIS — F4310 Post-traumatic stress disorder, unspecified: Secondary | ICD-10-CM

## 2022-01-27 NOTE — Progress Notes (Signed)
[facility] Counselor/Therapist Progress Note  Patient ID: Jared Alvarez, MRN: [id number],    Date: 01/27/2022  Time Spent: 60 minutes  Treatment Type: Individual Therapy  Reported Symptoms: The patient attended a face-to-face individual therapy session in the office today.  The patient reports having feelings of insecurity and depression.  Mental Status Exam: Appearance:  Casual     Behavior: Appropriate  Motor: Normal  Speech/Language:  Normal Rate  Affect: Blunt  Mood: pleasant  Thought process: normal  Thought content:   WNL  Sensory/Perceptual disturbances:   WNL  Orientation: oriented to person, place, time/date, and situation  Attention: Good  Concentration: Good  Memory: WNL  Fund of knowledge:  Good  Insight:   Good  Judgment:  Good  Impulse Control: Good   Risk Assessment: Danger to Self:  No Self-injurious Behavior: No Danger to Others: No Duty to Warn:no Physical Aggression / Violence:No  Access to Firearms a concern: No  Gang Involvement:No   Subjective: The patient attended a face-to-face individual therapy session in the office today.  The patient presents with a blunted affect and mood is anxious.  The patient reports that he and his wife are still struggling with their relationship.  We talked about his difficulty between asking for what he wants and conflict.  We talked about why he struggles with this issue and related it back to his parents.  The patient states that he has had a couple of times this last few weeks where he wanted to tell his wife that they needed one more happy and that maybe they should end it.  We talked about his difficulty at putting a steak in the sand.  He usually acquiesces and also says that it is his fault.  I explained to him that I wanted him to be able to stand up for himself and not react as much out of his old ways of being as opposed to responding out of his new adult self.  He does report that he feels  like the EMDR did work because he has been able to state his opinion more to his wife than he ever has been.  We will continue to work on helping him find his voice and figure out what it is that he is looking for.  The patient is making good progress.  Interventions: Cognitive Behavioral Therapy, Assertiveness/Communication, and Insight-Oriented, EMDR  Diagnosis:PTSD (post-traumatic stress disorder)  Adjustment disorder with mixed anxiety and depressed mood  Marital conflict  Plan: Client Abilities/Strengths  Insightful, motivated, supportive husband  Client Treatment Preferences  Outpatient Individual therapy/EMDR  Client Statement of Needs  "I am having nightmares and my therapist wondered if EMDR would be helpful"  Treatment Level  Outpatient Individual therapy  Symptoms  Demonstrates an exaggerated startle response.: No Description Entered (Status: improved). Depressed  or irritable mood.:  Displays a  significant decline in interest and engagement in activities.: (Status: maintained). Displays significant psychological and/or physiological distress resulting from internal and external  clues that are reminiscent of the traumatic event.:(Status: maintained).  Experiences disturbances in sleep.: (Status: maintained). Experiences disturbing  and persistent thoughts, images, and/or perceptions of the traumatic event.:  (Status:maintained). Experiences frequent nightmares.: (Status: improved).  Feelings of hopelessness, worthlessness, or inappropriate guilt.: (Status:  improved). Has been exposed to a traumatic event involving actual or perceived threat of death or  serious injury.:  (Status: maintained). Impairment in social, occupational, or  other areas of functioning.: . Intentionally avoids activities,  places,  people, or objects (e.g., up-armored vehicles) that evoke memories of the event.: (Status: improved). Reports difficulty concentrating as  well as feelings of guilt.:   Problems Addressed  Goals 1. Develop healthy thinking patterns and beliefs about self, others, and the world that lead to the alleviation and help prevent the relapse of  depression. Objective Identify and replace thoughts and beliefs that support depression. Target Date:09/17/2022 Frequency: Weekly Progress: 40 Modality: individual Related Interventions 1. Explore and restructure underlying assumptions and beliefs reflected in biased self-talk that  may put the client at risk for relapse or recurrence. 2. Conduct Cognitive-Behavioral Therapy (see Cognitive Behavior Therapy by Olevia Bowens; Overcoming Depression by Lynita Lombard al.), beginning with helping the client learn the connection among  cognition, depressive feelings, and actions. 2. Eliminate or reduce the negative impact trauma related symptoms have  on social, occupational, and family functioning. Objective Learn and implement personal skills to manage challenging situations related to trauma. Target Date: 09/17/2022 Frequency: Weekly Progress: 10 Modality: individual 3. No longer avoids persons, places, activities, and objects that are  reminiscent of the traumatic event. Objective Participate in Eye Movement Desensitization and Reprocessing (EMDR) to reduce emotional distress  related to traumatic thoughts, feelings, and images. Target Date: 09/17/2022 Frequency: Weekly Progress: 20 Modality: individual   Related Interventions 1. Utilize Eye Movement Desensitization and Reprocessing (EMDR) to reduce the client's  emotional reactivity to the traumatic event and reduce PTSD symptoms. Objective Learn and implement guided self-dialogue to manage thoughts, feelings, and urges brought on by  encounters with trauma-related situations. Target Date: 05/16/2024Frequency: Weekly Progress: 20 Modality: individual Related Interventions 1. Teach the client a guided self-dialogue procedure in which he/she learns to recognize   maladaptive self-talk, challenges its biases, copes with engendered feelings, overcomes  avoidance, and reinforces his/her accomplishments; review and reinforce progress, problemsolve obstacles. 4. No longer experiences intrusive event recollections, avoidance of event  reminders, intense arousal, or disinterest in activities or  relationships. 5. Thinks about or openly discusses the traumatic event with others  without experiencing psychological or physiological distress. Diagnosis:  Adjustment Disorder with mixed anxiety and depression Conditions For Discharge Achievement of treatment goals and objectives  Gorge Almanza G Golden Emile, LCSW

## 2022-02-03 ENCOUNTER — Telehealth: Payer: Self-pay | Admitting: Internal Medicine

## 2022-02-03 NOTE — Telephone Encounter (Signed)
Spoke with patient and he declined scheduling AWV at this time. He would like to call back to schedule Medicare Annual Wellness Visit at a later time.  Last AWV  02/05/21  Please schedule at anytime with [facility] if patient calls the office back.     Any questions, please call me at [phone]

## 2022-02-09 ENCOUNTER — Ambulatory Visit: Payer: Medicare Other | Admitting: Psychology

## 2022-02-18 ENCOUNTER — Ambulatory Visit (INDEPENDENT_AMBULATORY_CARE_PROVIDER_SITE_OTHER): Payer: Medicare Other | Admitting: Psychology

## 2022-02-18 DIAGNOSIS — F4310 Post-traumatic stress disorder, unspecified: Secondary | ICD-10-CM

## 2022-02-18 DIAGNOSIS — Z630 Problems in relationship with spouse or partner: Secondary | ICD-10-CM

## 2022-02-18 DIAGNOSIS — F4323 Adjustment disorder with mixed anxiety and depressed mood: Secondary | ICD-10-CM | POA: Diagnosis not present

## 2022-02-18 NOTE — Progress Notes (Signed)
[facility] Counselor/Therapist Progress Note  Patient ID: Jared Alvarez, MRN: [id number],    Date: 02/18/2022  Time Spent: 60 minutes  Treatment Type: Individual Therapy  Reported Symptoms: The patient attended a face-to-face individual therapy session in the office today.  The patient reports having feelings of insecurity and depression.  Mental Status Exam: Appearance:  Casual     Behavior: Appropriate  Motor: Normal  Speech/Language:  Normal Rate  Affect: Blunt  Mood: pleasant  Thought process: normal  Thought content:   WNL  Sensory/Perceptual disturbances:   WNL  Orientation: oriented to person, place, time/date, and situation  Attention: Good  Concentration: Good  Memory: WNL  Fund of knowledge:  Good  Insight:   Good  Judgment:  Good  Impulse Control: Good   Risk Assessment: Danger to Self:  No Self-injurious Behavior: No Danger to Others: No Duty to Warn:no Physical Aggression / Violence:No  Access to Firearms a concern: No  Gang Involvement:No   Subjective: The patient attended a face-to-face individual therapy session in the office today.  The patient presents with a blunted affect and mood is pleasant.  The patient reports that he did go with his wife to [place] and they had a conversation all the way they are and all the way back and he feels like they made some progress.  He states that he thinks he understands her better and that she may understand him better.  They did not come to any kind of conclusion about what they are going to choose to do about their marriage but we did talk about his need to be more authentic with himself and that that is partially what he struggles with.  I asked him to think about why he feels that he cannot stand up for himself and do what he needs to do and wants to do and he talked about previously that he is aversion to doing what he wants has to do with dealing with conflict.  We talked about needing to  help him work through why he cannot or will not do anything for himself that is good for him if he knows a conflict is involved with a significant person in his life.  The patient does seem to be making some progress because he does have a desire to come out of doing this and being able to stand up for himself.  Interventions: Cognitive Behavioral Therapy, Assertiveness/Communication, and Insight-Oriented, EMDR  Diagnosis:PTSD (post-traumatic stress disorder)  Adjustment disorder with mixed anxiety and depressed mood  Marital conflict  Plan: Client Abilities/Strengths  Insightful, motivated, supportive husband  Client Treatment Preferences  Outpatient Individual therapy/EMDR  Client Statement of Needs  "I am having nightmares and my therapist wondered if EMDR would be helpful"  Treatment Level  Outpatient Individual therapy  Symptoms  Demonstrates an exaggerated startle response.: No Description Entered (Status: improved). Depressed  or irritable mood.:  Displays a  significant decline in interest and engagement in activities.: (Status: maintained). Displays significant psychological and/or physiological distress resulting from internal and external  clues that are reminiscent of the traumatic event.:(Status: maintained).  Experiences disturbances in sleep.: (Status: maintained). Experiences disturbing  and persistent thoughts, images, and/or perceptions of the traumatic event.:  (Status:maintained). Experiences frequent nightmares.: (Status: improved).  Feelings of hopelessness, worthlessness, or inappropriate guilt.: (Status:  improved). Has been exposed to a traumatic event involving actual or perceived threat of death or  serious injury.:  (Status: maintained). Impairment in social, occupational, or  other areas of functioning.: . Intentionally avoids activities,  places, people, or objects (e.g., up-armored vehicles) that evoke memories of the event.: (Status: improved). Reports  difficulty concentrating as  well as feelings of guilt.:  Problems Addressed  Goals 1. Develop healthy thinking patterns and beliefs about self, others, and the world that lead to the alleviation and help prevent the relapse of  depression. Objective Identify and replace thoughts and beliefs that support depression. Target Date:09/17/2022 Frequency: Weekly Progress: 40 Modality: individual Related Interventions 1. Explore and restructure underlying assumptions and beliefs reflected in biased self-talk that  may put the client at risk for relapse or recurrence. 2. Conduct Cognitive-Behavioral Therapy (see Cognitive Behavior Therapy by Olevia Bowens; Overcoming Depression by Lynita Lombard al.), beginning with helping the client learn the connection among  cognition, depressive feelings, and actions. 2. Eliminate or reduce the negative impact trauma related symptoms have  on social, occupational, and family functioning. Objective Learn and implement personal skills to manage challenging situations related to trauma. Target Date: 09/17/2022 Frequency: Weekly Progress: 10 Modality: individual 3. No longer avoids persons, places, activities, and objects that are  reminiscent of the traumatic event. Objective Participate in Eye Movement Desensitization and Reprocessing (EMDR) to reduce emotional distress  related to traumatic thoughts, feelings, and images. Target Date: 09/17/2022 Frequency: Weekly Progress: 20 Modality: individual   Related Interventions 1. Utilize Eye Movement Desensitization and Reprocessing (EMDR) to reduce the client's  emotional reactivity to the traumatic event and reduce PTSD symptoms. Objective Learn and implement guided self-dialogue to manage thoughts, feelings, and urges brought on by  encounters with trauma-related situations. Target Date: 05/16/2024Frequency: Weekly Progress: 20 Modality: individual Related Interventions 1. Teach the client a guided self-dialogue  procedure in which he/she learns to recognize  maladaptive self-talk, challenges its biases, copes with engendered feelings, overcomes  avoidance, and reinforces his/her accomplishments; review and reinforce progress, problemsolve obstacles. 4. No longer experiences intrusive event recollections, avoidance of event  reminders, intense arousal, or disinterest in activities or  relationships. 5. Thinks about or openly discusses the traumatic event with others  without experiencing psychological or physiological distress. Diagnosis:  Adjustment Disorder with mixed anxiety and depression Conditions For Discharge Achievement of treatment goals and objectives  Carlson Belland G Sirius Woodford, LCSW

## 2022-02-23 ENCOUNTER — Ambulatory Visit: Payer: Medicare Other | Admitting: Psychology

## 2022-02-25 ENCOUNTER — Ambulatory Visit (INDEPENDENT_AMBULATORY_CARE_PROVIDER_SITE_OTHER): Payer: Medicare Other | Admitting: Psychology

## 2022-02-25 DIAGNOSIS — F4310 Post-traumatic stress disorder, unspecified: Secondary | ICD-10-CM

## 2022-02-25 DIAGNOSIS — F4323 Adjustment disorder with mixed anxiety and depressed mood: Secondary | ICD-10-CM

## 2022-02-25 DIAGNOSIS — Z630 Problems in relationship with spouse or partner: Secondary | ICD-10-CM | POA: Diagnosis not present

## 2022-02-25 NOTE — Progress Notes (Signed)
[facility] Counselor/Therapist Progress Note  Patient ID: CHAITANYA AMEDEE, MRN: [id number],    Date: 10/252023  Time Spent: 60 minutes  Treatment Type: Individual Therapy  Reported Symptoms: The patient attended a face-to-face individual therapy session in the office today.  The patient reports having feelings of insecurity and depression.  Mental Status Exam: Appearance:  Casual     Behavior: Appropriate  Motor: Normal  Speech/Language:  Normal Rate  Affect: Blunt  Mood: pleasant  Thought process: normal  Thought content:   WNL  Sensory/Perceptual disturbances:   WNL  Orientation: oriented to person, place, time/date, and situation  Attention: Good  Concentration: Good  Memory: WNL  Fund of knowledge:  Good  Insight:   Good  Judgment:  Good  Impulse Control: Good   Risk Assessment: Danger to Self:  No Self-injurious Behavior: No Danger to Others: No Duty to Warn:no Physical Aggression / Violence:No  Access to Firearms a concern: No  Gang Involvement:No   Subjective: The patient attended a face-to-face individual therapy session in the office today.  The patient presents with a blunted affect and mood is pleasant.  The patient reports that he feels like he has made some progress in therapy lately.  He talked about being on Prozac for quite some time and is wondering whether it really is doing anything for him.  He also talked about his predisposition to jump from subject to subject and as best possible that he could have some ADD.  We talked about medications and I recommended that he consider possibly going to a specialist to maybe change his medicine since he has been on the Prozac for quite some time.  He continues to see his primary care physician.  In addition he talked about the aging process and was talking about not knowing what direction he really is going in and we talked about Ericson's latter stages of development and I recommended that he look  this up.  I did tell the patient I felt like he was making progress in that he is not reacting as much as he was prior to coming in participating in the EMDR.  He seems to be managing things better with his wife and he still feels like he is not sure about what he wants to do or where he wants to go with his life.  I explained that we would continue to talk about this.  Interventions: Cognitive Behavioral Therapy, Assertiveness/Communication, and Insight-Oriented, EMDR  Diagnosis:PTSD (post-traumatic stress disorder)  Adjustment disorder with mixed anxiety and depressed mood  Marital conflict  Plan: Client Abilities/Strengths  Insightful, motivated, supportive husband  Client Treatment Preferences  Outpatient Individual therapy/EMDR  Client Statement of Needs  "I am having nightmares and my therapist wondered if EMDR would be helpful"  Treatment Level  Outpatient Individual therapy  Symptoms  Demonstrates an exaggerated startle response.: No Description Entered (Status: improved). Depressed  or irritable mood.:  Displays a  significant decline in interest and engagement in activities.: (Status: maintained). Displays significant psychological and/or physiological distress resulting from internal and external  clues that are reminiscent of the traumatic event.:(Status: maintained).  Experiences disturbances in sleep.: (Status: maintained). Experiences disturbing  and persistent thoughts, images, and/or perceptions of the traumatic event.:  (Status:maintained). Experiences frequent nightmares.: (Status: improved).  Feelings of hopelessness, worthlessness, or inappropriate guilt.: (Status:  improved). Has been exposed to a traumatic event involving actual or perceived threat of death or  serious injury.:  (Status: maintained). Impairment in social,  occupational, or  other areas of functioning.: . Intentionally avoids activities,  places, people, or objects (e.g., up-armored vehicles) that  evoke memories of the event.: (Status: improved). Reports difficulty concentrating as  well as feelings of guilt.:  Problems Addressed  Goals 1. Develop healthy thinking patterns and beliefs about self, others, and the world that lead to the alleviation and help prevent the relapse of  depression. Objective Identify and replace thoughts and beliefs that support depression. Target Date:09/17/2022 Frequency: Weekly Progress: 40 Modality: individual Related Interventions 1. Explore and restructure underlying assumptions and beliefs reflected in biased self-talk that  may put the client at risk for relapse or recurrence. 2. Conduct Cognitive-Behavioral Therapy (see Cognitive Behavior Therapy by Olevia Bowens; Overcoming Depression by Lynita Lombard al.), beginning with helping the client learn the connection among  cognition, depressive feelings, and actions. 2. Eliminate or reduce the negative impact trauma related symptoms have  on social, occupational, and family functioning. Objective Learn and implement personal skills to manage challenging situations related to trauma. Target Date: 09/17/2022 Frequency: Weekly Progress: 10 Modality: individual 3. No longer avoids persons, places, activities, and objects that are  reminiscent of the traumatic event. Objective Participate in Eye Movement Desensitization and Reprocessing (EMDR) to reduce emotional distress  related to traumatic thoughts, feelings, and images. Target Date: 09/17/2022 Frequency: Weekly Progress: 20 Modality: individual   Related Interventions 1. Utilize Eye Movement Desensitization and Reprocessing (EMDR) to reduce the client's  emotional reactivity to the traumatic event and reduce PTSD symptoms. Objective Learn and implement guided self-dialogue to manage thoughts, feelings, and urges brought on by  encounters with trauma-related situations. Target Date: 05/16/2024Frequency: Weekly Progress: 20 Modality: individual Related  Interventions 1. Teach the client a guided self-dialogue procedure in which he/she learns to recognize  maladaptive self-talk, challenges its biases, copes with engendered feelings, overcomes  avoidance, and reinforces his/her accomplishments; review and reinforce progress, problemsolve obstacles. 4. No longer experiences intrusive event recollections, avoidance of event  reminders, intense arousal, or disinterest in activities or  relationships. 5. Thinks about or openly discusses the traumatic event with others  without experiencing psychological or physiological distress. Diagnosis:  Adjustment Disorder with mixed anxiety and depression Conditions For Discharge Achievement of treatment goals and objectives  Indio Santilli G Tieasha Larsen, LCSW

## 2022-02-26 ENCOUNTER — Encounter: Payer: Self-pay | Admitting: Internal Medicine

## 2022-02-26 ENCOUNTER — Ambulatory Visit (INDEPENDENT_AMBULATORY_CARE_PROVIDER_SITE_OTHER): Payer: Medicare Other | Admitting: Internal Medicine

## 2022-02-26 VITALS — BP 140/85 | HR 54 | Temp 97.7°F | Ht 68.0 in | Wt 170.0 lb

## 2022-02-26 DIAGNOSIS — N320 Bladder-neck obstruction: Secondary | ICD-10-CM | POA: Diagnosis not present

## 2022-02-26 DIAGNOSIS — Z23 Encounter for immunization: Secondary | ICD-10-CM | POA: Diagnosis not present

## 2022-02-26 DIAGNOSIS — F339 Major depressive disorder, recurrent, unspecified: Secondary | ICD-10-CM

## 2022-02-26 DIAGNOSIS — I2510 Atherosclerotic heart disease of native coronary artery without angina pectoris: Secondary | ICD-10-CM

## 2022-02-26 DIAGNOSIS — E1159 Type 2 diabetes mellitus with other circulatory complications: Secondary | ICD-10-CM

## 2022-02-26 DIAGNOSIS — F419 Anxiety disorder, unspecified: Secondary | ICD-10-CM

## 2022-02-26 DIAGNOSIS — I951 Orthostatic hypotension: Secondary | ICD-10-CM

## 2022-02-26 MED ORDER — TADALAFIL 5 MG PO TABS: 5.0000 mg | ORAL_TABLET | Freq: Every day | ORAL | 3 refills | Status: DC

## 2022-02-26 NOTE — Progress Notes (Signed)
Subjective:  Patient ID: Jared Alvarez, male    DOB: 1950-09-08  Age: 71 y.o. MRN: [id number]  CC: Follow-up (Flu shot)   HPI Jared Alvarez presents for lightheadedness w/sudden movements; bending over.[name] x 1-2 weeks, daily F/u BPH   Outpatient Medications Prior to Visit  Medication Sig Dispense Refill   acetaminophen (TYLENOL) 500 MG tablet Take 1 tablet (500 mg total) by mouth every 6 (six) hours as needed. 30 tablet 2   amLODipine (NORVASC) 10 MG tablet Take 10 mg by mouth daily.     aspirin 81 MG tablet Take 1 tablet (81 mg total) by mouth daily. 30 tablet 2   colchicine 0.6 MG tablet Take 1 tablet (0.6 mg total) by mouth 2 (two) times daily. If patient has flare up with gout 30 tablet 5   Cyanocobalamin (VITAMIN B-12) 500 MCG SUBL Place 1 tablet (500 mcg total) under the tongue daily. 150 tablet 2   doxazosin (CARDURA) 2 MG tablet Take 1 tablet (2 mg total) by mouth daily. 90 tablet 3   FLUoxetine (PROZAC) 40 MG capsule Take 1 capsule (40 mg total) by mouth daily. 90 capsule 3   Homeopathic Products (ARNICARE ARNICA) OINT Apply 1 application topically 2 (two) times daily as needed (arthritis).     ibuprofen (ADVIL) 200 MG tablet Ibuprofen cream PRN     IBUPROFEN-LINIMENT CO by Combination route.     LORazepam (ATIVAN) 0.5 MG tablet TAKE 1 TABLET (0.5 MG TOTAL) BY MOUTH AT BEDTIME AS NEEDED FOR ANXIETY. TAKE 1/2 TAB BY MOUTH AT BEDTIME 90 tablet 1   losartan (COZAAR) 100 MG tablet Take 1 tablet (100 mg total) by mouth daily. 90 tablet 3   metFORMIN (GLUCOPHAGE) 1000 MG tablet TAKE ONE TABLET BY MOUTH TWICE A DAY WITH A MEAL 180 tablet 3   nitroGLYCERIN (NITROSTAT) 0.4 MG SL tablet Place 1 tablet (0.4 mg total) under the tongue every 5 (five) minutes as needed for chest pain. 25 tablet 3   pantoprazole (PROTONIX) 40 MG tablet Take 1 tablet (40 mg total) by mouth 2 (two) times daily. 180 tablet 3   rosuvastatin (CRESTOR) 20 MG tablet Take 1 tablet (20 mg total) by mouth 3 (three)  times a week. Mon, Wed, and Fri 90 tablet 1   triamcinolone (NASACORT) 55 MCG/ACT AERO nasal inhaler Place 2 sprays into the nose daily. 1 each 12   triamcinolone cream (KENALOG) 0.1 % Apply 1 application topically 2 (two) times daily. 100 g 0   tadalafil (CIALIS) 20 MG tablet Take 1 tablet (20 mg total) by mouth every other day as needed for erectile dysfunction. 45 tablet 3   cetirizine (ZYRTEC) 10 MG tablet Take 1 tablet (10 mg total) by mouth daily. 30 tablet 11   0.9 %  sodium chloride infusion      No facility-administered medications prior to visit.    ROS: Review of Systems  Constitutional:  Negative for appetite change, fatigue and unexpected weight change.  HENT:  Negative for congestion, nosebleeds, sneezing, sore throat and trouble swallowing.   Eyes:  Negative for itching and visual disturbance.  Respiratory:  Negative for cough.   Cardiovascular:  Negative for chest pain, palpitations and leg swelling.  Gastrointestinal:  Negative for abdominal distention, blood in stool, diarrhea and nausea.  Genitourinary:  Negative for frequency and hematuria.  Musculoskeletal:  Negative for back pain, gait problem, joint swelling and neck pain.  Skin:  Negative for rash.  Neurological:  Positive for light-headedness. Negative  for dizziness, tremors, syncope, speech difficulty and weakness.  Psychiatric/Behavioral:  Negative for agitation, dysphoric mood and sleep disturbance. The patient is not nervous/anxious.     Objective:  BP (!) 140/85 (Patient Position: Supine)   Pulse (!) 54   Temp 97.7 F (36.5 C) (Oral)   Ht '5\' 8"'$  (1.727 m)   Wt 170 lb (77.1 kg)   SpO2 98%   BMI 25.85 kg/m   BP Readings from Last 3 Encounters:  02/26/22 (!) 140/85  10/07/21 118/66  07/25/21 (!) 120/52    Wt Readings from Last 3 Encounters:  02/26/22 170 lb (77.1 kg)  10/07/21 172 lb (78 kg)  07/25/21 173 lb (78.5 kg)    Physical Exam Constitutional:      General: He is not in acute  distress.    Appearance: He is well-developed.     Comments: NAD  Eyes:     Conjunctiva/sclera: Conjunctivae normal.     Pupils: Pupils are equal, round, and reactive to light.  Neck:     Thyroid: No thyromegaly.     Vascular: No JVD.  Cardiovascular:     Rate and Rhythm: Normal rate and regular rhythm.     Heart sounds: Normal heart sounds. No murmur heard.    No friction rub. No gallop.  Pulmonary:     Effort: Pulmonary effort is normal. No respiratory distress.     Breath sounds: Normal breath sounds. No wheezing or rales.  Chest:     Chest wall: No tenderness.  Abdominal:     General: Bowel sounds are normal. There is no distension.     Palpations: Abdomen is soft. There is no mass.     Tenderness: There is no abdominal tenderness. There is no guarding or rebound.  Musculoskeletal:        General: No tenderness. Normal range of motion.     Cervical back: Normal range of motion.     Left lower leg: No edema.  Lymphadenopathy:     Cervical: No cervical adenopathy.  Skin:    General: Skin is warm and dry.     Findings: No rash.  Neurological:     Mental Status: He is alert and oriented to person, place, and time.     Cranial Nerves: No cranial nerve deficit.     Motor: No abnormal muscle tone.     Coordination: Coordination normal.     Gait: Gait normal.     Deep Tendon Reflexes: Reflexes are normal and symmetric.  Psychiatric:        Behavior: Behavior normal.        Thought Content: Thought content normal.        Judgment: Judgment normal.     Lab Results  Component Value Date   WBC 5.5 07/23/2021   HGB 11.8 (L) 07/23/2021   HCT 35.2 (L) 07/23/2021   PLT 248.0 07/23/2021   GLUCOSE 102 (H) 07/23/2021   CHOL 142 07/23/2021   TRIG 29.0 07/23/2021   HDL 67.90 07/23/2021   LDLDIRECT 149.6 12/15/2011   LDLCALC 68 07/23/2021   ALT 13 07/23/2021   AST 16 07/23/2021   NA 139 07/23/2021   K 4.8 07/23/2021   CL 104 07/23/2021   CREATININE 1.17 07/23/2021   BUN  31 (H) 07/23/2021   CO2 26 07/23/2021   TSH 1.82 07/23/2021   PSA 3.87 01/30/2021   INR 0.98 06/03/2010   HGBA1C 6.3 07/23/2021   MICROALBUR 1.3 10/15/2020    DG Knee Complete 4 Views  Left  Result Date: 02/17/2018 CLINICAL DATA:  Acute LEFT knee pain, fell recently while walking the dog EXAM: LEFT KNEE - COMPLETE 4+ VIEW COMPARISON:  None FINDINGS: Osseous mineralization normal. Mild medial compartment joint space narrowing. Scattered chondrocalcinosis question CPPD. No acute fracture, dislocation, or bone destruction. Question minimal calcific debris at suprapatellar recess. No knee joint effusion. IMPRESSION: Minimal degenerative changes and suspected CPPD of the LEFT knee with question minimal calcific debris at the suprapatellar recess. No acute abnormalities. Electronically Signed   By: Lavonia Dana M.D.   On: 02/17/2018 16:35    Assessment & Plan:   Problem List Items Addressed This Visit     Anxiety disorder    [name] wants to stop Lorazepam at hs prn - OK w/me      Bladder-neck obstruction    Stop Doxazosyn Start Cialis daily      Depression    Cont w/ Fluoxetine      DM type 2 causing vascular disease (HCC)    On B12      Relevant Medications   tadalafil (CIALIS) 5 MG tablet   Orthostatic hypotension    Stop Doxazosyn      Relevant Medications   tadalafil (CIALIS) 5 MG tablet   Other Visit Diagnoses     Needs flu shot    -  Primary   Relevant Orders   Flu Vaccine QUAD High Dose(Fluad) (Completed)         Meds ordered this encounter  Medications   tadalafil (CIALIS) 5 MG tablet    Sig: Take 1 tablet (5 mg total) by mouth daily.    Dispense:  90 tablet    Refill:  3      Follow-up: Return in about 3 months (around 05/29/2022) for a follow-up visit.  Walker Kehr, MD

## 2022-02-26 NOTE — Patient Instructions (Signed)
Stop Doxazosyn

## 2022-02-26 NOTE — Assessment & Plan Note (Signed)
Stop Doxazosyn

## 2022-02-26 NOTE — Assessment & Plan Note (Signed)
[name] wants to stop Lorazepam at hs prn - OK w/me

## 2022-02-26 NOTE — Assessment & Plan Note (Signed)
Stop Doxazosyn Start Cialis daily

## 2022-02-26 NOTE — Assessment & Plan Note (Signed)
Cont w/ Fluoxetine

## 2022-02-26 NOTE — Assessment & Plan Note (Signed)
On B12 

## 2022-03-05 ENCOUNTER — Ambulatory Visit (INDEPENDENT_AMBULATORY_CARE_PROVIDER_SITE_OTHER): Payer: Medicare Other | Admitting: Psychology

## 2022-03-05 DIAGNOSIS — F4310 Post-traumatic stress disorder, unspecified: Secondary | ICD-10-CM

## 2022-03-05 DIAGNOSIS — Z630 Problems in relationship with spouse or partner: Secondary | ICD-10-CM | POA: Diagnosis not present

## 2022-03-05 DIAGNOSIS — F4323 Adjustment disorder with mixed anxiety and depressed mood: Secondary | ICD-10-CM

## 2022-03-05 NOTE — Progress Notes (Signed)
[facility] Counselor/Therapist Progress Note  Patient ID: Jared Alvarez, MRN: [id number],    Date: 03/05/2022  Time Spent: 60 minutes  Treatment Type: Individual Therapy  Reported Symptoms: The patient attended a face-to-face individual therapy session in the office today.  The patient reports having feelings of insecurity and depression.  Mental Status Exam: Appearance:  Casual     Behavior: Appropriate  Motor: Normal  Speech/Language:  Normal Rate  Affect: Blunt  Mood: pleasant  Thought process: normal  Thought content:   WNL  Sensory/Perceptual disturbances:   WNL  Orientation: oriented to person, place, time/date, and situation  Attention: Good  Concentration: Good  Memory: WNL  Fund of knowledge:  Good  Insight:   Good  Judgment:  Good  Impulse Control: Good   Risk Assessment: Danger to Self:  No Self-injurious Behavior: No Danger to Others: No Duty to Warn:no Physical Aggression / Violence:No  Access to Firearms a concern: No  Gang Involvement:No   Subjective: The patient attended a face-to-face individual therapy session in the office today.  The patient presents with a blunted affect and mood is pleasant.  The patient reports that he feels like he is doing better with having done the EMDR that we have done so far.  He and his wife are still having issues and today he brought up that she has been drinking more and there is some concern that some of her irrational behavior may be related to the drinking.  We talked about the need for him to deal with the conflict and he continues to avoid to do this.  The patient reports that he is not necessarily happy with his wife but is having difficulty knowing how to navigate the waters.  I explained that he is going to have to at some point deal with the conflict and make some decisions.  We will continue to process how to navigate the waters and work with communication skills on how to handle the situation  with his wife.  Interventions: Cognitive Behavioral Therapy, Assertiveness/Communication, and Insight-Oriented, EMDR  Diagnosis:PTSD (post-traumatic stress disorder)  Adjustment disorder with mixed anxiety and depressed mood  Marital conflict  Plan: Client Abilities/Strengths  Insightful, motivated, supportive husband  Client Treatment Preferences  Outpatient Individual therapy/EMDR  Client Statement of Needs  "I am having nightmares and my therapist wondered if EMDR would be helpful"  Treatment Level  Outpatient Individual therapy  Symptoms  Demonstrates an exaggerated startle response.: No Description Entered (Status: improved). Depressed  or irritable mood.:  Displays a  significant decline in interest and engagement in activities.: (Status: maintained). Displays significant psychological and/or physiological distress resulting from internal and external  clues that are reminiscent of the traumatic event.:(Status: maintained).  Experiences disturbances in sleep.: (Status: maintained). Experiences disturbing  and persistent thoughts, images, and/or perceptions of the traumatic event.:  (Status:maintained). Experiences frequent nightmares.: (Status: improved).  Feelings of hopelessness, worthlessness, or inappropriate guilt.: (Status:  improved). Has been exposed to a traumatic event involving actual or perceived threat of death or  serious injury.:  (Status: maintained). Impairment in social, occupational, or  other areas of functioning.: . Intentionally avoids activities,  places, people, or objects (e.g., up-armored vehicles) that evoke memories of the event.: (Status: improved). Reports difficulty concentrating as  well as feelings of guilt.:  Problems Addressed  Goals 1. Develop healthy thinking patterns and beliefs about self, others, and the world that lead to the alleviation and help prevent the relapse of  depression. Objective  Identify and replace thoughts and  beliefs that support depression. Target Date:09/17/2022 Frequency: Weekly Progress: 40 Modality: individual Related Interventions 1. Explore and restructure underlying assumptions and beliefs reflected in biased self-talk that  may put the client at risk for relapse or recurrence. 2. Conduct Cognitive-Behavioral Therapy (see Cognitive Behavior Therapy by Olevia Bowens; Overcoming Depression by Lynita Lombard al.), beginning with helping the client learn the connection among  cognition, depressive feelings, and actions. 2. Eliminate or reduce the negative impact trauma related symptoms have  on social, occupational, and family functioning. Objective Learn and implement personal skills to manage challenging situations related to trauma. Target Date: 09/17/2022 Frequency: Weekly Progress: 10 Modality: individual 3. No longer avoids persons, places, activities, and objects that are  reminiscent of the traumatic event. Objective Participate in Eye Movement Desensitization and Reprocessing (EMDR) to reduce emotional distress  related to traumatic thoughts, feelings, and images. Target Date: 09/17/2022 Frequency: Weekly Progress: 20 Modality: individual   Related Interventions 1. Utilize Eye Movement Desensitization and Reprocessing (EMDR) to reduce the client's  emotional reactivity to the traumatic event and reduce PTSD symptoms. Objective Learn and implement guided self-dialogue to manage thoughts, feelings, and urges brought on by  encounters with trauma-related situations. Target Date: 05/16/2024Frequency: Weekly Progress: 20 Modality: individual Related Interventions 1. Teach the client a guided self-dialogue procedure in which he/she learns to recognize  maladaptive self-talk, challenges its biases, copes with engendered feelings, overcomes  avoidance, and reinforces his/her accomplishments; review and reinforce progress, problemsolve obstacles. 4. No longer experiences intrusive event  recollections, avoidance of event  reminders, intense arousal, or disinterest in activities or  relationships. 5. Thinks about or openly discusses the traumatic event with others  without experiencing psychological or physiological distress. Diagnosis:  Adjustment Disorder with mixed anxiety and depression Conditions For Discharge Achievement of treatment goals and objectives  Cindi Ghazarian G Djuana Littleton, LCSW

## 2022-03-09 ENCOUNTER — Ambulatory Visit: Payer: Medicare Other | Admitting: Psychology

## 2022-03-10 ENCOUNTER — Ambulatory Visit: Payer: Medicare Other | Admitting: Psychology

## 2022-03-19 ENCOUNTER — Ambulatory Visit (INDEPENDENT_AMBULATORY_CARE_PROVIDER_SITE_OTHER): Payer: Medicare Other | Admitting: Psychology

## 2022-03-19 DIAGNOSIS — F4310 Post-traumatic stress disorder, unspecified: Secondary | ICD-10-CM | POA: Diagnosis not present

## 2022-03-19 DIAGNOSIS — Z630 Problems in relationship with spouse or partner: Secondary | ICD-10-CM

## 2022-03-19 DIAGNOSIS — F4323 Adjustment disorder with mixed anxiety and depressed mood: Secondary | ICD-10-CM | POA: Diagnosis not present

## 2022-03-20 NOTE — Progress Notes (Signed)
[facility] Counselor/Therapist Progress Note  Patient ID: Jared Alvarez, MRN: [id number],    Date: 03/19/2022  Time Spent: 60 minutes  Treatment Type: Individual Therapy  Reported Symptoms: The patient attended a face-to-face individual therapy session in the office today.  The patient reports having feelings of insecurity and depression.  Mental Status Exam: Appearance:  Casual     Behavior: Appropriate  Motor: Normal  Speech/Language:  Normal Rate  Affect: Blunt  Mood: pleasant  Thought process: normal  Thought content:   WNL  Sensory/Perceptual disturbances:   WNL  Orientation: oriented to person, place, time/date, and situation  Attention: Good  Concentration: Good  Memory: WNL  Fund of knowledge:  Good  Insight:   Good  Judgment:  Good  Impulse Control: Good   Risk Assessment: Danger to Self:  No Self-injurious Behavior: No Danger to Others: No Duty to Warn:no Physical Aggression / Violence:No  Access to Firearms a concern: No  Gang Involvement:No   Subjective: The patient attended a face-to-face individual therapy session in the office today.  The patient presents with a blunted affect and mood is pleasant.  The patient reports that he and his wife seem to be doing better with their communication.  They have had some good conversations since I last saw him.  He is doing better with dealing with conflict but still struggles a bit with this.  Today we talked about 3 options that he has.  The first option is that he and his wife stayed together and try to work on things and make it better.  I explained to him that the issue with that 1 is that both would have to be totally totally committed to working it out.  It would also probably mean that his wife may have to decrease her drinking behavior.  The second option would be that they live together and go their separate ways and do what ever they need to do and live together.  And the third option is that  they divorced and separate totally.  I asked the patient which 1 he was leaning more toward and he again did what he normally does to avoid by talking about a lot of different things.  Then I ask him which 2 sounds like the most doable and he avoided that as well.  We also talked today about his desire for intimacy and broke it down to whether it was just he desires intimacy or whether he wants sex.  The patient states that he really wants sex and his wife is not willing to do that I explained to him that he is probably going to have to have a difficult conversation with her about this if he wants to have it happen.  The patient is making progress with being more direct and dealing with things however he still has some difficulty.  Interventions: Cognitive Behavioral Therapy, Assertiveness/Communication, and Insight-Oriented, EMDR  Diagnosis:PTSD (post-traumatic stress disorder)  Adjustment disorder with mixed anxiety and depressed mood  Marital conflict  Plan: Client Abilities/Strengths  Insightful, motivated, supportive husband  Client Treatment Preferences  Outpatient Individual therapy/EMDR  Client Statement of Needs  "I am having nightmares and my therapist wondered if EMDR would be helpful"  Treatment Level  Outpatient Individual therapy  Symptoms  Demonstrates an exaggerated startle response.: No Description Entered (Status: improved). Depressed  or irritable mood.:  Displays a  significant decline in interest and engagement in activities.: (Status: maintained). Displays significant psychological and/or physiological distress  resulting from internal and external  clues that are reminiscent of the traumatic event.:(Status: maintained).  Experiences disturbances in sleep.: (Status: maintained). Experiences disturbing  and persistent thoughts, images, and/or perceptions of the traumatic event.:  (Status:maintained). Experiences frequent nightmares.: (Status: improved).  Feelings of  hopelessness, worthlessness, or inappropriate guilt.: (Status:  improved). Has been exposed to a traumatic event involving actual or perceived threat of death or  serious injury.:  (Status: maintained). Impairment in social, occupational, or  other areas of functioning.: . Intentionally avoids activities,  places, people, or objects (e.g., up-armored vehicles) that evoke memories of the event.: (Status: improved). Reports difficulty concentrating as  well as feelings of guilt.:  Problems Addressed  Goals 1. Develop healthy thinking patterns and beliefs about self, others, and the world that lead to the alleviation and help prevent the relapse of  depression. Objective Identify and replace thoughts and beliefs that support depression. Target Date:09/17/2022 Frequency: Weekly Progress: 40 Modality: individual Related Interventions 1. Explore and restructure underlying assumptions and beliefs reflected in biased self-talk that  may put the client at risk for relapse or recurrence. 2. Conduct Cognitive-Behavioral Therapy (see Cognitive Behavior Therapy by Olevia Bowens; Overcoming Depression by Lynita Lombard al.), beginning with helping the client learn the connection among  cognition, depressive feelings, and actions. 2. Eliminate or reduce the negative impact trauma related symptoms have  on social, occupational, and family functioning. Objective Learn and implement personal skills to manage challenging situations related to trauma. Target Date: 09/17/2022 Frequency: Weekly Progress: 10 Modality: individual 3. No longer avoids persons, places, activities, and objects that are  reminiscent of the traumatic event. Objective Participate in Eye Movement Desensitization and Reprocessing (EMDR) to reduce emotional distress  related to traumatic thoughts, feelings, and images. Target Date: 09/17/2022 Frequency: Weekly Progress: 20 Modality: individual   Related Interventions 1. Utilize Eye Movement  Desensitization and Reprocessing (EMDR) to reduce the client's  emotional reactivity to the traumatic event and reduce PTSD symptoms. Objective Learn and implement guided self-dialogue to manage thoughts, feelings, and urges brought on by  encounters with trauma-related situations. Target Date: 05/16/2024Frequency: Weekly Progress: 20 Modality: individual Related Interventions 1. Teach the client a guided self-dialogue procedure in which he/she learns to recognize  maladaptive self-talk, challenges its biases, copes with engendered feelings, overcomes  avoidance, and reinforces his/her accomplishments; review and reinforce progress, problemsolve obstacles. 4. No longer experiences intrusive event recollections, avoidance of event  reminders, intense arousal, or disinterest in activities or  relationships. 5. Thinks about or openly discusses the traumatic event with others  without experiencing psychological or physiological distress. Diagnosis:  Adjustment Disorder with mixed anxiety and depression Conditions For Discharge Achievement of treatment goals and objectives  Anela Bensman G Johncarlos Holtsclaw, LCSW

## 2022-03-23 ENCOUNTER — Ambulatory Visit: Payer: Medicare Other | Admitting: Psychology

## 2022-03-31 ENCOUNTER — Ambulatory Visit (INDEPENDENT_AMBULATORY_CARE_PROVIDER_SITE_OTHER): Payer: Medicare Other | Admitting: Psychology

## 2022-03-31 DIAGNOSIS — Z630 Problems in relationship with spouse or partner: Secondary | ICD-10-CM

## 2022-03-31 DIAGNOSIS — F4323 Adjustment disorder with mixed anxiety and depressed mood: Secondary | ICD-10-CM

## 2022-03-31 DIAGNOSIS — F4310 Post-traumatic stress disorder, unspecified: Secondary | ICD-10-CM | POA: Diagnosis not present

## 2022-04-01 NOTE — Progress Notes (Signed)
[facility] Counselor/Therapist Progress Note  Patient ID: Jared Alvarez, MRN: [id number],    Date: 03/31/2022  Time Spent: 60 minutes  Treatment Type: Individual Therapy  Reported Symptoms: The patient attended a face-to-face individual therapy session in the office today.  The patient reports having feelings of insecurity and depression.  Mental Status Exam: Appearance:  Casual     Behavior: Appropriate  Motor: Normal  Speech/Language:  Normal Rate  Affect: Blunt  Mood: anxious  Thought process: normal  Thought content:   WNL  Sensory/Perceptual disturbances:   WNL  Orientation: oriented to person, place, time/date, and situation  Attention: Good  Concentration: Good  Memory: WNL  Fund of knowledge:  Good  Insight:   Good  Judgment:  Good  Impulse Control: Good   Risk Assessment: Danger to Self:  No Self-injurious Behavior: No Danger to Others: No Duty to Warn:no Physical Aggression / Violence:No  Access to Firearms a concern: No  Gang Involvement:No   Subjective: The patient attended a face-to-face individual therapy session in the office today.  The patient presents with a blunted affect and mood is anxious.  The patient reports that he is struggling with some of his old behaviors.  We talked about his people pleasing and discussed the need for him to continue to set limits with himself and also with his wife.  It seems that he struggles sometimes with wanting external validation from her and with wanting to make her happy.  We talked about some of his patterns of behavior and talked about him understanding himself more in understanding what it is that he is trying to do.  Encouraged him to speak up and voice his opinion about something in a nice way.  The patient still struggling with feeling secure within himself.   Interventions: Cognitive Behavioral Therapy, Assertiveness/Communication, and Insight-Oriented, EMDR  Diagnosis:PTSD (post-traumatic  stress disorder)  Adjustment disorder with mixed anxiety and depressed mood  Marital conflict  Plan: Client Abilities/Strengths  Insightful, motivated, supportive husband  Client Treatment Preferences  Outpatient Individual therapy/EMDR  Client Statement of Needs  "I am having nightmares and my therapist wondered if EMDR would be helpful"  Treatment Level  Outpatient Individual therapy  Symptoms  Demonstrates an exaggerated startle response.: No Description Entered (Status: improved). Depressed  or irritable mood.:  Displays a  significant decline in interest and engagement in activities.: (Status: maintained). Displays significant psychological and/or physiological distress resulting from internal and external  clues that are reminiscent of the traumatic event.:(Status: maintained).  Experiences disturbances in sleep.: (Status: maintained). Experiences disturbing  and persistent thoughts, images, and/or perceptions of the traumatic event.:  (Status:maintained). Experiences frequent nightmares.: (Status: improved).  Feelings of hopelessness, worthlessness, or inappropriate guilt.: (Status:  improved). Has been exposed to a traumatic event involving actual or perceived threat of death or  serious injury.:  (Status: maintained). Impairment in social, occupational, or  other areas of functioning.: . Intentionally avoids activities,  places, people, or objects (e.g., up-armored vehicles) that evoke memories of the event.: (Status: improved). Reports difficulty concentrating as  well as feelings of guilt.:  Problems Addressed  Goals 1. Develop healthy thinking patterns and beliefs about self, others, and the world that lead to the alleviation and help prevent the relapse of  depression. Objective Identify and replace thoughts and beliefs that support depression. Target Date:09/17/2022 Frequency: Weekly Progress: 40 Modality: individual Related Interventions 1. Explore and  restructure underlying assumptions and beliefs reflected in biased self-talk that  may put  the client at risk for relapse or recurrence. 2. Conduct Cognitive-Behavioral Therapy (see Cognitive Behavior Therapy by Olevia Bowens; Overcoming Depression by Lynita Lombard al.), beginning with helping the client learn the connection among  cognition, depressive feelings, and actions. 2. Eliminate or reduce the negative impact trauma related symptoms have  on social, occupational, and family functioning. Objective Learn and implement personal skills to manage challenging situations related to trauma. Target Date: 09/17/2022 Frequency: Weekly Progress: 10 Modality: individual 3. No longer avoids persons, places, activities, and objects that are  reminiscent of the traumatic event. Objective Participate in Eye Movement Desensitization and Reprocessing (EMDR) to reduce emotional distress  related to traumatic thoughts, feelings, and images. Target Date: 09/17/2022 Frequency: Weekly Progress: 20 Modality: individual   Related Interventions 1. Utilize Eye Movement Desensitization and Reprocessing (EMDR) to reduce the client's  emotional reactivity to the traumatic event and reduce PTSD symptoms. Objective Learn and implement guided self-dialogue to manage thoughts, feelings, and urges brought on by  encounters with trauma-related situations. Target Date: 05/16/2024Frequency: Weekly Progress: 20 Modality: individual Related Interventions 1. Teach the client a guided self-dialogue procedure in which he/she learns to recognize  maladaptive self-talk, challenges its biases, copes with engendered feelings, overcomes  avoidance, and reinforces his/her accomplishments; review and reinforce progress, problemsolve obstacles. 4. No longer experiences intrusive event recollections, avoidance of event  reminders, intense arousal, or disinterest in activities or  relationships. 5. Thinks about or openly discusses the  traumatic event with others  without experiencing psychological or physiological distress. Diagnosis:  Adjustment Disorder with mixed anxiety and depression Conditions For Discharge Achievement of treatment goals and objectives  Aislinn Feliz G Kortne All, LCSW

## 2022-04-06 ENCOUNTER — Ambulatory Visit: Payer: Medicare Other | Admitting: Psychology

## 2022-04-07 ENCOUNTER — Ambulatory Visit (INDEPENDENT_AMBULATORY_CARE_PROVIDER_SITE_OTHER): Payer: Medicare Other | Admitting: Psychology

## 2022-04-07 DIAGNOSIS — Z630 Problems in relationship with spouse or partner: Secondary | ICD-10-CM

## 2022-04-07 DIAGNOSIS — F4323 Adjustment disorder with mixed anxiety and depressed mood: Secondary | ICD-10-CM

## 2022-04-07 DIAGNOSIS — F4310 Post-traumatic stress disorder, unspecified: Secondary | ICD-10-CM | POA: Diagnosis not present

## 2022-04-07 NOTE — Progress Notes (Signed)
[facility] Counselor/Therapist Progress Note  Patient ID: Jared Alvarez, MRN: [id number],    Date: 12/05//2023  Time Spent: 60 minutes  Treatment Type: Individual Therapy  Reported Symptoms: The patient attended a face-to-face individual therapy session in the office today.  The patient reports having feelings of insecurity and depression.  Mental Status Exam: Appearance:  Casual     Behavior: Appropriate  Motor: Normal  Speech/Language:  Normal Rate  Affect: Blunt  Mood: depressed  Thought process: normal  Thought content:   WNL  Sensory/Perceptual disturbances:   WNL  Orientation: oriented to person, place, time/date, and situation  Attention: Good  Concentration: Good  Memory: WNL  Fund of knowledge:  Good  Insight:   Good  Judgment:  Good  Impulse Control: Good   Risk Assessment: Danger to Self:  No Self-injurious Behavior: No Danger to Others: No Duty to Warn:no Physical Aggression / Violence:No  Access to Firearms a concern: No  Gang Involvement:No   Subjective: The patient attended a face-to-face individual therapy session in the office today.  The patient presents with a blunted affect and mood is depressed.  The patient seems to be having difficulty managing his emotions and he has decreased his amount of Prozac over the last few weeks and this seems to be a bit of an issue for him.  I recommended that he go back on his regular regular dose if his doctor felt that this was something he needed to do.  We talked about still needing to define what it is that he wants in his life and he can define that he does not want conflict.  We will talk more about this during our next session and see if we can drilled down what it is that he is looking for. Interventions: Cognitive Behavioral Therapy, Assertiveness/Communication, and Insight-Oriented, EMDR  Diagnosis:PTSD (post-traumatic stress disorder)  Adjustment disorder with mixed anxiety and depressed  mood  Marital conflict  Plan: Client Abilities/Strengths  Insightful, motivated, supportive husband  Client Treatment Preferences  Outpatient Individual therapy/EMDR  Client Statement of Needs  "I am having nightmares and my therapist wondered if EMDR would be helpful"  Treatment Level  Outpatient Individual therapy  Symptoms  Demonstrates an exaggerated startle response.: No Description Entered (Status: improved). Depressed  or irritable mood.:  Displays a  significant decline in interest and engagement in activities.: (Status: maintained). Displays significant psychological and/or physiological distress resulting from internal and external  clues that are reminiscent of the traumatic event.:(Status: maintained).  Experiences disturbances in sleep.: (Status: maintained). Experiences disturbing  and persistent thoughts, images, and/or perceptions of the traumatic event.:  (Status:maintained). Experiences frequent nightmares.: (Status: improved).  Feelings of hopelessness, worthlessness, or inappropriate guilt.: (Status:  improved). Has been exposed to a traumatic event involving actual or perceived threat of death or  serious injury.:  (Status: maintained). Impairment in social, occupational, or  other areas of functioning.: . Intentionally avoids activities,  places, people, or objects (e.g., up-armored vehicles) that evoke memories of the event.: (Status: improved). Reports difficulty concentrating as  well as feelings of guilt.:  Problems Addressed  Goals 1. Develop healthy thinking patterns and beliefs about self, others, and the world that lead to the alleviation and help prevent the relapse of  depression. Objective Identify and replace thoughts and beliefs that support depression. Target Date:09/17/2022 Frequency: Weekly Progress: 40 Modality: individual Related Interventions 1. Explore and restructure underlying assumptions and beliefs reflected in biased self-talk that   may put the client at risk  for relapse or recurrence. 2. Conduct Cognitive-Behavioral Therapy (see Cognitive Behavior Therapy by Olevia Bowens; Overcoming Depression by Lynita Lombard al.), beginning with helping the client learn the connection among  cognition, depressive feelings, and actions. 2. Eliminate or reduce the negative impact trauma related symptoms have  on social, occupational, and family functioning. Objective Learn and implement personal skills to manage challenging situations related to trauma. Target Date: 09/17/2022 Frequency: Weekly Progress: 10 Modality: individual 3. No longer avoids persons, places, activities, and objects that are  reminiscent of the traumatic event. Objective Participate in Eye Movement Desensitization and Reprocessing (EMDR) to reduce emotional distress  related to traumatic thoughts, feelings, and images. Target Date: 09/17/2022 Frequency: Weekly Progress: 20 Modality: individual   Related Interventions 1. Utilize Eye Movement Desensitization and Reprocessing (EMDR) to reduce the client's  emotional reactivity to the traumatic event and reduce PTSD symptoms. Objective Learn and implement guided self-dialogue to manage thoughts, feelings, and urges brought on by  encounters with trauma-related situations. Target Date: 05/16/2024Frequency: Weekly Progress: 20 Modality: individual Related Interventions 1. Teach the client a guided self-dialogue procedure in which he/she learns to recognize  maladaptive self-talk, challenges its biases, copes with engendered feelings, overcomes  avoidance, and reinforces his/her accomplishments; review and reinforce progress, problemsolve obstacles. 4. No longer experiences intrusive event recollections, avoidance of event  reminders, intense arousal, or disinterest in activities or  relationships. 5. Thinks about or openly discusses the traumatic event with others  without experiencing psychological or physiological  distress. Diagnosis:  Adjustment Disorder with mixed anxiety and depression Conditions For Discharge Achievement of treatment goals and objectives  Jared Alvarez G Jared Wintle, LCSW

## 2022-04-14 ENCOUNTER — Ambulatory Visit (INDEPENDENT_AMBULATORY_CARE_PROVIDER_SITE_OTHER): Payer: Medicare Other | Admitting: Psychology

## 2022-04-14 DIAGNOSIS — F4310 Post-traumatic stress disorder, unspecified: Secondary | ICD-10-CM

## 2022-04-14 DIAGNOSIS — F4323 Adjustment disorder with mixed anxiety and depressed mood: Secondary | ICD-10-CM

## 2022-04-14 DIAGNOSIS — Z630 Problems in relationship with spouse or partner: Secondary | ICD-10-CM

## 2022-04-14 NOTE — Progress Notes (Signed)
[place] Counselor/Therapist Progress Note  Patient ID: Jared Alvarez, MRN: [id number],    Date: 12/12//2023  Time Spent: 60 minutes  Treatment Type: Individual Therapy  Reported Symptoms: The patient attended a face-to-face individual therapy session in the office today.  The patient reports having feelings of insecurity and depression.  Mental Status Exam: Appearance:  Casual     Behavior: Appropriate  Motor: Normal  Speech/Language:  Normal Rate  Affect: Blunt  Mood: depressed  Thought process: normal  Thought content:   WNL  Sensory/Perceptual disturbances:   WNL  Orientation: oriented to person, place, time/date, and situation  Attention: Good  Concentration: Good  Memory: WNL  Fund of knowledge:  Good  Insight:   Good  Judgment:  Good  Impulse Control: Good   Risk Assessment: Danger to Self:  No Self-injurious Behavior: No Danger to Others: No Duty to Warn:no Physical Aggression / Violence:No  Access to Firearms a concern: No  Gang Involvement:No   Subjective: The patient attended a face-to-face individual therapy session in the office today.  The patient presents with a blunted affect and mood is depressed.  The patient is still struggling with feeling depressed and not having much confidence within himself.  The patient states that he is not sure what he wants and he is continuing to struggle with honing in on what is really the problem.  I believe that the patient is having difficulty because he is not sure that he wants to continue his relationship with his wife because she is very demanding and not insightful into herself and he does not want to set limits with her.  He would prefer to avoid the conflict of dealing with the marriage issues.  We talked about him having a conversation with his wife about what she wants and helping clarify that for her as well.  I still encouraged the patient to have a conversation with his wife about her getting  therapy and also them having therapy as a couple.   Interventions: Cognitive Behavioral Therapy, Assertiveness/Communication, and Insight-Oriented, EMDR  Diagnosis:PTSD (post-traumatic stress disorder)  Adjustment disorder with mixed anxiety and depressed mood  Marital conflict  Plan: Client Abilities/Strengths  Insightful, motivated, supportive husband  Client Treatment Preferences  Outpatient Individual therapy/EMDR  Client Statement of Needs  "I am having nightmares and my therapist wondered if EMDR would be helpful"  Treatment Level  Outpatient Individual therapy  Symptoms  Demonstrates an exaggerated startle response.: No Description Entered (Status: improved). Depressed  or irritable mood.:  Displays a  significant decline in interest and engagement in activities.: (Status: maintained). Displays significant psychological and/or physiological distress resulting from internal and external  clues that are reminiscent of the traumatic event.:(Status: maintained).  Experiences disturbances in sleep.: (Status: maintained). Experiences disturbing  and persistent thoughts, images, and/or perceptions of the traumatic event.:  (Status:maintained). Experiences frequent nightmares.: (Status: improved).  Feelings of hopelessness, worthlessness, or inappropriate guilt.: (Status:  improved). Has been exposed to a traumatic event involving actual or perceived threat of death or  serious injury.:  (Status: maintained). Impairment in social, occupational, or  other areas of functioning.: . Intentionally avoids activities,  places, people, or objects (e.g., up-armored vehicles) that evoke memories of the event.: (Status: improved). Reports difficulty concentrating as  well as feelings of guilt.:  Problems Addressed  Goals 1. Develop healthy thinking patterns and beliefs about self, others, and the world that lead to the alleviation and help prevent the relapse of   depression. Objective Identify  and replace thoughts and beliefs that support depression. Target Date:09/17/2022 Frequency: Weekly Progress: 40 Modality: individual Related Interventions 1. Explore and restructure underlying assumptions and beliefs reflected in biased self-talk that  may put the client at risk for relapse or recurrence. 2. Conduct Cognitive-Behavioral Therapy (see Cognitive Behavior Therapy by Olevia Bowens; Overcoming Depression by Lynita Lombard al.), beginning with helping the client learn the connection among  cognition, depressive feelings, and actions. 2. Eliminate or reduce the negative impact trauma related symptoms have  on social, occupational, and family functioning. Objective Learn and implement personal skills to manage challenging situations related to trauma. Target Date: 09/17/2022 Frequency: Weekly Progress: 10 Modality: individual 3. No longer avoids persons, places, activities, and objects that are  reminiscent of the traumatic event. Objective Participate in Eye Movement Desensitization and Reprocessing (EMDR) to reduce emotional distress  related to traumatic thoughts, feelings, and images. Target Date: 09/17/2022 Frequency: Weekly Progress: 20 Modality: individual   Related Interventions 1. Utilize Eye Movement Desensitization and Reprocessing (EMDR) to reduce the client's  emotional reactivity to the traumatic event and reduce PTSD symptoms. Objective Learn and implement guided self-dialogue to manage thoughts, feelings, and urges brought on by  encounters with trauma-related situations. Target Date: 05/16/2024Frequency: Weekly Progress: 20 Modality: individual Related Interventions 1. Teach the client a guided self-dialogue procedure in which he/she learns to recognize  maladaptive self-talk, challenges its biases, copes with engendered feelings, overcomes  avoidance, and reinforces his/her accomplishments; review and reinforce progress, problemsolve  obstacles. 4. No longer experiences intrusive event recollections, avoidance of event  reminders, intense arousal, or disinterest in activities or  relationships. 5. Thinks about or openly discusses the traumatic event with others  without experiencing psychological or physiological distress. Diagnosis:  Adjustment Disorder with mixed anxiety and depression Conditions For Discharge Achievement of treatment goals and objectives  Jared Alvarez G Marc Sivertsen, LCSW

## 2022-04-20 ENCOUNTER — Ambulatory Visit: Payer: Medicare Other | Admitting: Psychology

## 2022-04-21 ENCOUNTER — Ambulatory Visit (INDEPENDENT_AMBULATORY_CARE_PROVIDER_SITE_OTHER): Payer: Medicare Other | Admitting: Psychology

## 2022-04-21 DIAGNOSIS — F4310 Post-traumatic stress disorder, unspecified: Secondary | ICD-10-CM

## 2022-04-21 DIAGNOSIS — Z630 Problems in relationship with spouse or partner: Secondary | ICD-10-CM

## 2022-04-21 DIAGNOSIS — F4323 Adjustment disorder with mixed anxiety and depressed mood: Secondary | ICD-10-CM

## 2022-04-22 NOTE — Progress Notes (Signed)
[place] Counselor/Therapist Progress Note  Patient ID: Jared Alvarez, MRN: [id number],    Date: 12/19//2023  Time Spent: 60 minutes  Treatment Type: Individual Therapy  Reported Symptoms: The patient attended a face-to-face individual therapy session in the office today.  The patient reports having feelings of insecurity and depression.  Mental Status Exam: Appearance:  Casual     Behavior: Appropriate  Motor: Normal  Speech/Language:  Normal Rate  Affect: Blunt  Mood: depressed  Thought process: normal  Thought content:   WNL  Sensory/Perceptual disturbances:   WNL  Orientation: oriented to person, place, time/date, and situation  Attention: Good  Concentration: Good  Memory: WNL  Fund of knowledge:  Good  Insight:   Good  Judgment:  Good  Impulse Control: Good   Risk Assessment: Danger to Self:  No Self-injurious Behavior: No Danger to Others: No Duty to Warn:no Physical Aggression / Violence:No  Access to Firearms a concern: No  Gang Involvement:No   Subjective: The patient attended a face-to-face individual therapy session in the office today.  The patient presents with a blunted affect and mood is depressed.  The patient reports that he is confused.  When asked what his confusion was about he went off into talking about everything and not knowing who he is or what he wants and all those things that are somewhat vague.  We talked about how to narrow things down and I explained to him that I thought that his distractions were so that he would not have to make a decision that he does not want to make.  We need to discuss this further and help him get to having a little more clarity about himself and what it is he actually wants to have happened in his life. Interventions: Cognitive Behavioral Therapy, Assertiveness/Communication, and Insight-Oriented, EMDR  Diagnosis:PTSD (post-traumatic stress disorder)  Adjustment disorder with mixed anxiety  and depressed mood  Marital conflict  Plan: Client Abilities/Strengths  Insightful, motivated, supportive husband  Client Treatment Preferences  Outpatient Individual therapy/EMDR  Client Statement of Needs  "I am having nightmares and my therapist wondered if EMDR would be helpful"  Treatment Level  Outpatient Individual therapy  Symptoms  Demonstrates an exaggerated startle response.: No Description Entered (Status: improved). Depressed  or irritable mood.:  Displays a  significant decline in interest and engagement in activities.: (Status: maintained). Displays significant psychological and/or physiological distress resulting from internal and external  clues that are reminiscent of the traumatic event.:(Status: maintained).  Experiences disturbances in sleep.: (Status: maintained). Experiences disturbing  and persistent thoughts, images, and/or perceptions of the traumatic event.:  (Status:maintained). Experiences frequent nightmares.: (Status: improved).  Feelings of hopelessness, worthlessness, or inappropriate guilt.: (Status:  improved). Has been exposed to a traumatic event involving actual or perceived threat of death or  serious injury.:  (Status: maintained). Impairment in social, occupational, or  other areas of functioning.: . Intentionally avoids activities,  places, people, or objects (e.g., up-armored vehicles) that evoke memories of the event.: (Status: improved). Reports difficulty concentrating as  well as feelings of guilt.:  Problems Addressed  Goals 1. Develop healthy thinking patterns and beliefs about self, others, and the world that lead to the alleviation and help prevent the relapse of  depression. Objective Identify and replace thoughts and beliefs that support depression. Target Date:09/17/2022 Frequency: Weekly Progress: 40 Modality: individual Related Interventions 1. Explore and restructure underlying assumptions and beliefs reflected in biased  self-talk that  may put the client at risk  for relapse or recurrence. 2. Conduct Cognitive-Behavioral Therapy (see Cognitive Behavior Therapy by Olevia Bowens; Overcoming Depression by Lynita Lombard al.), beginning with helping the client learn the connection among  cognition, depressive feelings, and actions. 2. Eliminate or reduce the negative impact trauma related symptoms have  on social, occupational, and family functioning. Objective Learn and implement personal skills to manage challenging situations related to trauma. Target Date: 09/17/2022 Frequency: Weekly Progress: 10 Modality: individual 3. No longer avoids persons, places, activities, and objects that are  reminiscent of the traumatic event. Objective Participate in Eye Movement Desensitization and Reprocessing (EMDR) to reduce emotional distress  related to traumatic thoughts, feelings, and images. Target Date: 09/17/2022 Frequency: Weekly Progress: 20 Modality: individual   Related Interventions 1. Utilize Eye Movement Desensitization and Reprocessing (EMDR) to reduce the client's  emotional reactivity to the traumatic event and reduce PTSD symptoms. Objective Learn and implement guided self-dialogue to manage thoughts, feelings, and urges brought on by  encounters with trauma-related situations. Target Date: 05/16/2024Frequency: Weekly Progress: 20 Modality: individual Related Interventions 1. Teach the client a guided self-dialogue procedure in which he/she learns to recognize  maladaptive self-talk, challenges its biases, copes with engendered feelings, overcomes  avoidance, and reinforces his/her accomplishments; review and reinforce progress, problemsolve obstacles. 4. No longer experiences intrusive event recollections, avoidance of event  reminders, intense arousal, or disinterest in activities or  relationships. 5. Thinks about or openly discusses the traumatic event with others  without experiencing psychological or  physiological distress. Diagnosis:  Adjustment Disorder with mixed anxiety and depression Conditions For Discharge Achievement of treatment goals and objectives  Jared Szeliga G Chisum Habenicht, LCSW

## 2022-04-24 ENCOUNTER — Other Ambulatory Visit: Payer: Self-pay | Admitting: Internal Medicine

## 2022-05-07 ENCOUNTER — Ambulatory Visit (INDEPENDENT_AMBULATORY_CARE_PROVIDER_SITE_OTHER): Payer: Medicare Other | Admitting: Psychology

## 2022-05-07 DIAGNOSIS — F4323 Adjustment disorder with mixed anxiety and depressed mood: Secondary | ICD-10-CM | POA: Diagnosis not present

## 2022-05-07 DIAGNOSIS — F4310 Post-traumatic stress disorder, unspecified: Secondary | ICD-10-CM | POA: Diagnosis not present

## 2022-05-07 DIAGNOSIS — Z630 Problems in relationship with spouse or partner: Secondary | ICD-10-CM

## 2022-05-07 NOTE — Progress Notes (Signed)
[place] Counselor/Therapist Progress Note  Patient ID: Jared Alvarez, MRN: [id number],    Date: 05/07/2022  Time Spent: 60 minutes  Treatment Type: Individual Therapy  Reported Symptoms: The patient attended a face-to-face individual therapy session in the office today.  The patient reports having feelings of insecurity and depression.  Mental Status Exam: Appearance:  Casual     Behavior: Appropriate  Motor: Normal  Speech/Language:  Normal Rate  Affect: Blunt  Mood: depressed  Thought process: normal  Thought content:   WNL  Sensory/Perceptual disturbances:   WNL  Orientation: oriented to person, place, time/date, and situation  Attention: Good  Concentration: Good  Memory: WNL  Fund of knowledge:  Good  Insight:   Good  Judgment:  Good  Impulse Control: Good   Risk Assessment: Danger to Self:  No Self-injurious Behavior: No Danger to Others: No Duty to Warn:no Physical Aggression / Violence:No  Access to Firearms a concern: No  Gang Involvement:No   Subjective: The patient attended a face-to-face individual therapy session in the office today.  The patient presents with a blunted affect and mood is depressed.  The patient reports that he continues to be confused and is thinking about the past and his accomplishments and that he had a discussion with his wife and it seems that she is not not thinking that looking back is a good idea.  To help the patient with this and I talked with him about Ericson's stages of development.  We talked about him possibly being partially in generativity versus stagnation and also integrity versus despair.  I explained to him what that meant.  We also discussed the concerns I have about his expectations of what therapy is supposed to be about.  We talked more about normalizing that he is exactly where he is supposed to be in looking back at his life.  During the next session we need to talk more about his expectations of  what therapy is going to do for him.  Interventions: Cognitive Behavioral Therapy, Assertiveness/Communication, and Insight-Oriented, EMDR  Diagnosis:PTSD (post-traumatic stress disorder)  Adjustment disorder with mixed anxiety and depressed mood  Marital conflict  Plan: Client Abilities/Strengths  Insightful, motivated, supportive husband  Client Treatment Preferences  Outpatient Individual therapy/EMDR  Client Statement of Needs  "I am having nightmares and my therapist wondered if EMDR would be helpful"  Treatment Level  Outpatient Individual therapy  Symptoms  Demonstrates an exaggerated startle response.: No Description Entered (Status: improved). Depressed  or irritable mood.:  Displays a  significant decline in interest and engagement in activities.: (Status: maintained). Displays significant psychological and/or physiological distress resulting from internal and external  clues that are reminiscent of the traumatic event.:(Status: maintained).  Experiences disturbances in sleep.: (Status: maintained). Experiences disturbing  and persistent thoughts, images, and/or perceptions of the traumatic event.:  (Status:maintained). Experiences frequent nightmares.: (Status: improved).  Feelings of hopelessness, worthlessness, or inappropriate guilt.: (Status:  improved). Has been exposed to a traumatic event involving actual or perceived threat of death or  serious injury.:  (Status: maintained). Impairment in social, occupational, or  other areas of functioning.: . Intentionally avoids activities,  places, people, or objects (e.g., up-armored vehicles) that evoke memories of the event.: (Status: improved). Reports difficulty concentrating as  well as feelings of guilt.:  Problems Addressed  Goals 1. Develop healthy thinking patterns and beliefs about self, others, and the world that lead to the alleviation and help prevent the relapse of  depression. Objective Identify  and  replace thoughts and beliefs that support depression. Target Date:09/17/2022 Frequency: Weekly Progress: 40 Modality: individual Related Interventions 1. Explore and restructure underlying assumptions and beliefs reflected in biased self-talk that  may put the client at risk for relapse or recurrence. 2. Conduct Cognitive-Behavioral Therapy (see Cognitive Behavior Therapy by Olevia Bowens; Overcoming Depression by Lynita Lombard al.), beginning with helping the client learn the connection among  cognition, depressive feelings, and actions. 2. Eliminate or reduce the negative impact trauma related symptoms have  on social, occupational, and family functioning. Objective Learn and implement personal skills to manage challenging situations related to trauma. Target Date: 09/17/2022 Frequency: Weekly Progress: 10 Modality: individual 3. No longer avoids persons, places, activities, and objects that are  reminiscent of the traumatic event. Objective Participate in Eye Movement Desensitization and Reprocessing (EMDR) to reduce emotional distress  related to traumatic thoughts, feelings, and images. Target Date: 09/17/2022 Frequency: Weekly Progress: 20 Modality: individual   Related Interventions 1. Utilize Eye Movement Desensitization and Reprocessing (EMDR) to reduce the client's  emotional reactivity to the traumatic event and reduce PTSD symptoms. Objective Learn and implement guided self-dialogue to manage thoughts, feelings, and urges brought on by  encounters with trauma-related situations. Target Date: 05/16/2024Frequency: Weekly Progress: 20 Modality: individual Related Interventions 1. Teach the client a guided self-dialogue procedure in which he/she learns to recognize  maladaptive self-talk, challenges its biases, copes with engendered feelings, overcomes  avoidance, and reinforces his/her accomplishments; review and reinforce progress, problemsolve obstacles. 4. No longer experiences  intrusive event recollections, avoidance of event  reminders, intense arousal, or disinterest in activities or  relationships. 5. Thinks about or openly discusses the traumatic event with others  without experiencing psychological or physiological distress. Diagnosis:  Adjustment Disorder with mixed anxiety and depression Conditions For Discharge Achievement of treatment goals and objectives  Ingeborg Fite G Saud Bail, LCSW

## 2022-05-15 ENCOUNTER — Ambulatory Visit: Payer: Medicare Other | Admitting: Psychology

## 2022-05-19 ENCOUNTER — Ambulatory Visit: Payer: Medicare Other | Admitting: Psychology

## 2022-05-26 ENCOUNTER — Ambulatory Visit (INDEPENDENT_AMBULATORY_CARE_PROVIDER_SITE_OTHER): Payer: Medicare Other | Admitting: Psychology

## 2022-05-26 DIAGNOSIS — F4323 Adjustment disorder with mixed anxiety and depressed mood: Secondary | ICD-10-CM | POA: Diagnosis not present

## 2022-05-26 DIAGNOSIS — Z630 Problems in relationship with spouse or partner: Secondary | ICD-10-CM

## 2022-05-26 DIAGNOSIS — F4310 Post-traumatic stress disorder, unspecified: Secondary | ICD-10-CM

## 2022-05-26 NOTE — Progress Notes (Signed)
[place] Counselor/Therapist Progress Note  Patient ID: Jared Alvarez, MRN: [id number],    Date: 05/26/2022  Time Spent: 60 minutes  Treatment Type: Individual Therapy  Reported Symptoms: The patient attended a face-to-face individual therapy session in the office today.  The patient reports having feelings of insecurity and depression.  Mental Status Exam: Appearance:  Casual     Behavior: Appropriate  Motor: Normal  Speech/Language:  Normal Rate  Affect: Blunt  Mood: depressed  Thought process: normal  Thought content:   WNL  Sensory/Perceptual disturbances:   WNL  Orientation: oriented to person, place, time/date, and situation  Attention: Good  Concentration: Good  Memory: WNL  Fund of knowledge:  Good  Insight:   Good  Judgment:  Good  Impulse Control: Good   Risk Assessment: Danger to Self:  No Self-injurious Behavior: No Danger to Others: No Duty to Warn:no Physical Aggression / Violence:No  Access to Firearms a concern: No  Gang Involvement:No   Subjective: The patient attended a face-to-face individual therapy session in the office today.  The patient presents as pleasant and cooperative.  The patient states that he feels like he had a breakthrough.  He talked about understanding that he sometimes responds as an adolescent and sometimes he responds as man and he would like to integrate the two.  We talked about all of these aspects of himself being himself and that from a neuro science standpoint he was taught how to react based on how other people responded to him.  We did some EMDR today with the negative cognition being "I am a baby".  The positive cognition that we installed was "I am in control" and also "I am a man".  We talked about the patient's wife being in the role of parents and him being in the role of child.  We will continue to work with these concepts during our next session.  Interventions: Cognitive Behavioral Therapy,  Assertiveness/Communication, and Insight-Oriented, EMDR  Diagnosis:PTSD (post-traumatic stress disorder)  Adjustment disorder with mixed anxiety and depressed mood  Marital conflict  Plan: Client Abilities/Strengths  Insightful, motivated, supportive husband  Client Treatment Preferences  Outpatient Individual therapy/EMDR  Client Statement of Needs  "I am having nightmares and my therapist wondered if EMDR would be helpful"  Treatment Level  Outpatient Individual therapy  Symptoms  Demonstrates an exaggerated startle response.: No Description Entered (Status: improved). Depressed  or irritable mood.:  Displays a  significant decline in interest and engagement in activities.: (Status: maintained). Displays significant psychological and/or physiological distress resulting from internal and external  clues that are reminiscent of the traumatic event.:(Status: maintained).  Experiences disturbances in sleep.: (Status: maintained). Experiences disturbing  and persistent thoughts, images, and/or perceptions of the traumatic event.:  (Status:maintained). Experiences frequent nightmares.: (Status: improved).  Feelings of hopelessness, worthlessness, or inappropriate guilt.: (Status:  improved). Has been exposed to a traumatic event involving actual or perceived threat of death or  serious injury.:  (Status: maintained). Impairment in social, occupational, or  other areas of functioning.: . Intentionally avoids activities,  places, people, or objects (e.g., up-armored vehicles) that evoke memories of the event.: (Status: improved). Reports difficulty concentrating as  well as feelings of guilt.:  Problems Addressed  Goals 1. Develop healthy thinking patterns and beliefs about self, others, and the world that lead to the alleviation and help prevent the relapse of  depression. Objective Identify and replace thoughts and beliefs that support depression. Target Date:09/17/2022 Frequency:  Weekly Progress: 40  Modality: individual Related Interventions 1. Explore and restructure underlying assumptions and beliefs reflected in biased self-talk that  may put the client at risk for relapse or recurrence. 2. Conduct Cognitive-Behavioral Therapy (see Cognitive Behavior Therapy by Olevia Bowens; Overcoming Depression by Lynita Lombard al.), beginning with helping the client learn the connection among  cognition, depressive feelings, and actions. 2. Eliminate or reduce the negative impact trauma related symptoms have  on social, occupational, and family functioning. Objective Learn and implement personal skills to manage challenging situations related to trauma. Target Date: 09/17/2022 Frequency: Weekly Progress: 10 Modality: individual 3. No longer avoids persons, places, activities, and objects that are  reminiscent of the traumatic event. Objective Participate in Eye Movement Desensitization and Reprocessing (EMDR) to reduce emotional distress  related to traumatic thoughts, feelings, and images. Target Date: 09/17/2022 Frequency: Weekly Progress: 20 Modality: individual   Related Interventions 1. Utilize Eye Movement Desensitization and Reprocessing (EMDR) to reduce the client's  emotional reactivity to the traumatic event and reduce PTSD symptoms. Objective Learn and implement guided self-dialogue to manage thoughts, feelings, and urges brought on by  encounters with trauma-related situations. Target Date: 05/16/2024Frequency: Weekly Progress: 20 Modality: individual Related Interventions 1. Teach the client a guided self-dialogue procedure in which he/she learns to recognize  maladaptive self-talk, challenges its biases, copes with engendered feelings, overcomes  avoidance, and reinforces his/her accomplishments; review and reinforce progress, problemsolve obstacles. 4. No longer experiences intrusive event recollections, avoidance of event  reminders, intense arousal, or  disinterest in activities or  relationships. 5. Thinks about or openly discusses the traumatic event with others  without experiencing psychological or physiological distress. Diagnosis:  Adjustment Disorder with mixed anxiety and depression Conditions For Discharge Achievement of treatment goals and objectives  Yaretzy Olazabal G Mariaclara Spear, LCSW

## 2022-06-03 ENCOUNTER — Encounter: Payer: Self-pay | Admitting: Internal Medicine

## 2022-06-03 ENCOUNTER — Ambulatory Visit (INDEPENDENT_AMBULATORY_CARE_PROVIDER_SITE_OTHER): Payer: Medicare Other | Admitting: Internal Medicine

## 2022-06-03 VITALS — BP 124/82 | HR 53 | Temp 97.8°F | Ht 68.0 in | Wt 171.0 lb

## 2022-06-03 DIAGNOSIS — E7800 Pure hypercholesterolemia, unspecified: Secondary | ICD-10-CM

## 2022-06-03 DIAGNOSIS — R400 Somnolence: Secondary | ICD-10-CM | POA: Diagnosis not present

## 2022-06-03 DIAGNOSIS — E1159 Type 2 diabetes mellitus with other circulatory complications: Secondary | ICD-10-CM

## 2022-06-03 DIAGNOSIS — E785 Hyperlipidemia, unspecified: Secondary | ICD-10-CM

## 2022-06-03 DIAGNOSIS — M5440 Lumbago with sciatica, unspecified side: Secondary | ICD-10-CM | POA: Diagnosis not present

## 2022-06-03 DIAGNOSIS — F4323 Adjustment disorder with mixed anxiety and depressed mood: Secondary | ICD-10-CM | POA: Diagnosis not present

## 2022-06-03 DIAGNOSIS — N320 Bladder-neck obstruction: Secondary | ICD-10-CM

## 2022-06-03 DIAGNOSIS — E538 Deficiency of other specified B group vitamins: Secondary | ICD-10-CM | POA: Diagnosis not present

## 2022-06-03 DIAGNOSIS — G8929 Other chronic pain: Secondary | ICD-10-CM

## 2022-06-03 DIAGNOSIS — I10 Essential (primary) hypertension: Secondary | ICD-10-CM | POA: Diagnosis not present

## 2022-06-03 MED ORDER — TADALAFIL 20 MG PO TABS: 20.0000 mg | ORAL_TABLET | ORAL | 1 refills | Status: DC | PRN

## 2022-06-03 MED ORDER — LORAZEPAM 1 MG PO TABS: 0.5000 mg | ORAL_TABLET | Freq: Two times a day (BID) | ORAL | 3 refills | Status: DC | PRN

## 2022-06-03 NOTE — Assessment & Plan Note (Signed)
Chronic  On Crestor

## 2022-06-03 NOTE — Assessment & Plan Note (Signed)
Chronic  Amlodipine, Losartan

## 2022-06-03 NOTE — Assessment & Plan Note (Signed)
Tramadol prn  Potential benefits of a long term tramadol use as well as potential risks (i.e. addiction risk, apnea etc) and complications (i.e. Somnolence, constipation and others) were explained to the patient and were aknowledged. The Original McKenzie Self Inflating Airback Lumbar Support

## 2022-06-03 NOTE — Assessment & Plan Note (Signed)
Take a nap during the day

## 2022-06-03 NOTE — Progress Notes (Signed)
Subjective:  Patient ID: Asaph Serena Welsch, male    DOB: 02-Jul-1950  Age: 72 y.o. MRN: [id number]  CC: Follow-up (Go over lab results )   HPI Jasin Brazel Titsworth presents for CAD, anxiety, bladder neck obstruction  Outpatient Medications Prior to Visit  Medication Sig Dispense Refill   acetaminophen (TYLENOL) 500 MG tablet Take 1 tablet (500 mg total) by mouth every 6 (six) hours as needed. 30 tablet 2   amLODipine (NORVASC) 10 MG tablet Take 10 mg by mouth daily.     aspirin 81 MG tablet Take 1 tablet (81 mg total) by mouth daily. 30 tablet 2   colchicine 0.6 MG tablet Take 1 tablet (0.6 mg total) by mouth 2 (two) times daily. If patient has flare up with gout 30 tablet 5   Cyanocobalamin (VITAMIN B-12) 500 MCG SUBL Place 1 tablet (500 mcg total) under the tongue daily. 150 tablet 2   doxazosin (CARDURA) 2 MG tablet Take 1 tablet (2 mg total) by mouth daily. 90 tablet 3   Homeopathic Products (ARNICARE ARNICA) OINT Apply 1 application topically 2 (two) times daily as needed (arthritis).     ibuprofen (ADVIL) 200 MG tablet Ibuprofen cream PRN     IBUPROFEN-LINIMENT CO by Combination route.     losartan (COZAAR) 100 MG tablet TAKE 1 TABLET BY MOUTH EVERY DAY 90 tablet 3   metFORMIN (GLUCOPHAGE) 1000 MG tablet TAKE ONE TABLET BY MOUTH TWICE A DAY WITH A MEAL 180 tablet 3   nitroGLYCERIN (NITROSTAT) 0.4 MG SL tablet Place 1 tablet (0.4 mg total) under the tongue every 5 (five) minutes as needed for chest pain. 25 tablet 3   pantoprazole (PROTONIX) 40 MG tablet Take 1 tablet (40 mg total) by mouth 2 (two) times daily. 180 tablet 3   rosuvastatin (CRESTOR) 20 MG tablet Take 1 tablet (20 mg total) by mouth 3 (three) times a week. Mon, Wed, and Fri 90 tablet 1   triamcinolone (NASACORT) 55 MCG/ACT AERO nasal inhaler Place 2 sprays into the nose daily. 1 each 12   triamcinolone cream (KENALOG) 0.1 % Apply 1 application topically 2 (two) times daily. 100 g 0   FLUoxetine (PROZAC) 40 MG capsule Take 1  capsule (40 mg total) by mouth daily. 90 capsule 3   LORazepam (ATIVAN) 0.5 MG tablet TAKE 1 TABLET (0.5 MG TOTAL) BY MOUTH AT BEDTIME AS NEEDED FOR ANXIETY. TAKE 1/2 TAB BY MOUTH AT BEDTIME 90 tablet 1   tadalafil (CIALIS) 5 MG tablet Take 1 tablet (5 mg total) by mouth daily. 90 tablet 3   cetirizine (ZYRTEC) 10 MG tablet Take 1 tablet (10 mg total) by mouth daily. 30 tablet 11   No facility-administered medications prior to visit.    ROS: Review of Systems  Constitutional:  Negative for appetite change, fatigue and unexpected weight change.  HENT:  Negative for congestion, nosebleeds, sneezing, sore throat and trouble swallowing.   Eyes:  Negative for itching and visual disturbance.  Respiratory:  Negative for cough.   Cardiovascular:  Negative for chest pain, palpitations and leg swelling.  Gastrointestinal:  Negative for abdominal distention, blood in stool, diarrhea and nausea.  Genitourinary:  Negative for frequency and hematuria.  Musculoskeletal:  Negative for back pain, gait problem, joint swelling and neck pain.  Skin:  Negative for rash.  Neurological:  Negative for dizziness, tremors, speech difficulty and weakness.  Psychiatric/Behavioral:  Positive for decreased concentration. Negative for agitation, dysphoric mood, sleep disturbance and suicidal ideas. The patient is  nervous/anxious.     Objective:  BP 124/82 (BP Location: Left Arm, Patient Position: Sitting, Cuff Size: Normal)   Pulse (!) 53   Temp 97.8 F (36.6 C) (Oral)   Ht '5\' 8"'$  (1.727 m)   Wt 171 lb (77.6 kg)   SpO2 98%   BMI 26.00 kg/m   BP Readings from Last 3 Encounters:  06/03/22 124/82  02/26/22 (!) 140/85  10/07/21 118/66    Wt Readings from Last 3 Encounters:  06/03/22 171 lb (77.6 kg)  02/26/22 170 lb (77.1 kg)  10/07/21 172 lb (78 kg)    Physical Exam Constitutional:      General: He is not in acute distress.    Appearance: He is well-developed. He is obese.     Comments: NAD  Eyes:      Conjunctiva/sclera: Conjunctivae normal.     Pupils: Pupils are equal, round, and reactive to light.  Neck:     Thyroid: No thyromegaly.     Vascular: No JVD.  Cardiovascular:     Rate and Rhythm: Normal rate and regular rhythm.     Heart sounds: Normal heart sounds. No murmur heard.    No friction rub. No gallop.  Pulmonary:     Effort: Pulmonary effort is normal. No respiratory distress.     Breath sounds: Normal breath sounds. No wheezing or rales.  Chest:     Chest wall: No tenderness.  Abdominal:     General: Bowel sounds are normal. There is no distension.     Palpations: Abdomen is soft. There is no mass.     Tenderness: There is no abdominal tenderness. There is no guarding or rebound.  Musculoskeletal:        General: No tenderness. Normal range of motion.     Cervical back: Normal range of motion.  Lymphadenopathy:     Cervical: No cervical adenopathy.  Skin:    General: Skin is warm and dry.     Findings: No rash.  Neurological:     Mental Status: He is alert and oriented to person, place, and time.     Cranial Nerves: No cranial nerve deficit.     Motor: No abnormal muscle tone.     Coordination: Coordination normal.     Gait: Gait normal.     Deep Tendon Reflexes: Reflexes are normal and symmetric.  Psychiatric:        Behavior: Behavior normal.        Thought Content: Thought content normal.        Judgment: Judgment normal.     Lab Results  Component Value Date   WBC 5.5 07/23/2021   HGB 11.8 (L) 07/23/2021   HCT 35.2 (L) 07/23/2021   PLT 248.0 07/23/2021   GLUCOSE 102 (H) 07/23/2021   CHOL 142 07/23/2021   TRIG 29.0 07/23/2021   HDL 67.90 07/23/2021   LDLDIRECT 149.6 12/15/2011   LDLCALC 68 07/23/2021   ALT 13 07/23/2021   AST 16 07/23/2021   NA 139 07/23/2021   K 4.8 07/23/2021   CL 104 07/23/2021   CREATININE 1.17 07/23/2021   BUN 31 (H) 07/23/2021   CO2 26 07/23/2021   TSH 1.82 07/23/2021   PSA 3.87 01/30/2021   INR 0.98 06/03/2010    HGBA1C 6.3 07/23/2021   MICROALBUR 1.3 10/15/2020    DG Knee Complete 4 Views Left  Result Date: 02/17/2018 CLINICAL DATA:  Acute LEFT knee pain, fell recently while walking the dog EXAM: LEFT KNEE - COMPLETE 4+  VIEW COMPARISON:  None FINDINGS: Osseous mineralization normal. Mild medial compartment joint space narrowing. Scattered chondrocalcinosis question CPPD. No acute fracture, dislocation, or bone destruction. Question minimal calcific debris at suprapatellar recess. No knee joint effusion. IMPRESSION: Minimal degenerative changes and suspected CPPD of the LEFT knee with question minimal calcific debris at the suprapatellar recess. No acute abnormalities. Electronically Signed   By: Lavonia Dana M.D.   On: 02/17/2018 16:35    Assessment & Plan:   Problem List Items Addressed This Visit       Cardiovascular and Mediastinum   DM type 2 causing vascular disease ([place]) - Primary    Check A1c On Metformin      Relevant Medications   tadalafil (CIALIS) 20 MG tablet   Other Relevant Orders   Comprehensive metabolic panel   Hemoglobin A1c   Essential hypertension    Chronic  Amlodipine, Losartan      Relevant Medications   tadalafil (CIALIS) 20 MG tablet   Other Relevant Orders   Urinalysis   CBC with Differential/Platelet   Hemoglobin A1c     Genitourinary   Bladder-neck obstruction    Check PSA      Relevant Orders   PSA     Other   Adjustment disorder with mixed anxiety and depressed mood   B12 deficiency    On B12      Dyslipidemia    Chronic  On Crestor      Hypercholesterolemia   Relevant Medications   tadalafil (CIALIS) 20 MG tablet   Other Relevant Orders   TSH   Lipid panel   LOW BACK PAIN    Tramadol prn  Potential benefits of a long term tramadol use as well as potential risks (i.e. addiction risk, apnea etc) and complications (i.e. Somnolence, constipation and others) were explained to the patient and were aknowledged. The Original McKenzie  Self Inflating Airback Lumbar Support      Somnolence, daytime    Take a nap during the day         Meds ordered this encounter  Medications   tadalafil (CIALIS) 20 MG tablet    Sig: Take 1 tablet (20 mg total) by mouth every other day as needed for erectile dysfunction.    Dispense:  90 tablet    Refill:  1   LORazepam (ATIVAN) 1 MG tablet    Sig: Take 0.5-1 tablets (0.5-1 mg total) by mouth 2 (two) times daily as needed for anxiety.    Dispense:  60 tablet    Refill:  3      Follow-up: Return in about 6 months (around 12/02/2022) for a follow-up visit.  Walker Kehr, MD

## 2022-06-03 NOTE — Assessment & Plan Note (Signed)
Check A1c On Metformin

## 2022-06-03 NOTE — Assessment & Plan Note (Signed)
Check PSA. ?

## 2022-06-03 NOTE — Assessment & Plan Note (Signed)
On B12 

## 2022-06-09 ENCOUNTER — Ambulatory Visit: Payer: Medicare Other | Admitting: Psychology

## 2022-06-11 ENCOUNTER — Other Ambulatory Visit (INDEPENDENT_AMBULATORY_CARE_PROVIDER_SITE_OTHER): Payer: Medicare Other

## 2022-06-11 DIAGNOSIS — N320 Bladder-neck obstruction: Secondary | ICD-10-CM | POA: Diagnosis not present

## 2022-06-11 DIAGNOSIS — E1159 Type 2 diabetes mellitus with other circulatory complications: Secondary | ICD-10-CM | POA: Diagnosis not present

## 2022-06-11 DIAGNOSIS — I10 Essential (primary) hypertension: Secondary | ICD-10-CM

## 2022-06-11 DIAGNOSIS — E7800 Pure hypercholesterolemia, unspecified: Secondary | ICD-10-CM | POA: Diagnosis not present

## 2022-06-11 LAB — CBC WITH DIFFERENTIAL/PLATELET
Basophils Absolute: 0 10*3/uL (ref 0.0–0.1)
Basophils Relative: 0.3 % (ref 0.0–3.0)
Eosinophils Absolute: 0.1 10*3/uL (ref 0.0–0.7)
Eosinophils Relative: 1.4 % (ref 0.0–5.0)
HCT: 34.5 % — ABNORMAL LOW (ref 39.0–52.0)
Hemoglobin: 11.8 g/dL — ABNORMAL LOW (ref 13.0–17.0)
Lymphocytes Relative: 20 % (ref 12.0–46.0)
Lymphs Abs: 0.9 10*3/uL (ref 0.7–4.0)
MCHC: 34.2 g/dL (ref 30.0–36.0)
MCV: 94.4 fl (ref 78.0–100.0)
Monocytes Absolute: 0.5 10*3/uL (ref 0.1–1.0)
Monocytes Relative: 11.1 % (ref 3.0–12.0)
Neutro Abs: 3.2 10*3/uL (ref 1.4–7.7)
Neutrophils Relative %: 67.2 % (ref 43.0–77.0)
Platelets: 205 10*3/uL (ref 150.0–400.0)
RBC: 3.65 Mil/uL — ABNORMAL LOW (ref 4.22–5.81)
RDW: 13.8 % (ref 11.5–15.5)
WBC: 4.7 10*3/uL (ref 4.0–10.5)

## 2022-06-11 LAB — TSH: TSH: 1.61 u[IU]/mL (ref 0.35–5.50)

## 2022-06-11 LAB — LIPID PANEL
Cholesterol: 147 mg/dL (ref 0–200)
HDL: 75 mg/dL (ref >39.00)
LDL Cholesterol: 64 mg/dL (ref 0–99)
NonHDL: 72.01
Total CHOL/HDL Ratio: 2
Triglycerides: 41 mg/dL (ref 0.0–149.0)
VLDL: 8.2 mg/dL (ref 0.0–40.0)

## 2022-06-11 LAB — URINALYSIS, ROUTINE W REFLEX MICROSCOPIC
Bilirubin Urine: NEGATIVE
Hgb urine dipstick: NEGATIVE
Ketones, ur: NEGATIVE
Nitrite: NEGATIVE
RBC / HPF: NONE SEEN (ref 0–?)
Specific Gravity, Urine: 1.025 (ref 1.000–1.030)
Total Protein, Urine: NEGATIVE
Urine Glucose: NEGATIVE
Urobilinogen, UA: 0.2 (ref 0.0–1.0)
pH: 5.5 (ref 5.0–8.0)

## 2022-06-11 LAB — HEMOGLOBIN A1C: Hgb A1c MFr Bld: 6 % (ref 4.6–6.5)

## 2022-06-11 LAB — COMPREHENSIVE METABOLIC PANEL
ALT: 9 U/L (ref 0–53)
AST: 12 U/L (ref 0–37)
Albumin: 4.1 g/dL (ref 3.5–5.2)
Alkaline Phosphatase: 83 U/L (ref 39–117)
BUN: 23 mg/dL (ref 6–23)
CO2: 27 mEq/L (ref 19–32)
Calcium: 9.4 mg/dL (ref 8.4–10.5)
Chloride: 104 mEq/L (ref 96–112)
Creatinine, Ser: 0.97 mg/dL (ref 0.40–1.50)
GFR: 78.47 mL/min (ref >60.00)
Glucose, Bld: 115 mg/dL — ABNORMAL HIGH (ref 70–99)
Potassium: 4.8 mEq/L (ref 3.5–5.1)
Sodium: 142 mEq/L (ref 135–145)
Total Bilirubin: 0.6 mg/dL (ref 0.2–1.2)
Total Protein: 5.9 g/dL — ABNORMAL LOW (ref 6.0–8.3)

## 2022-06-11 LAB — PSA: PSA: 0.91 ng/mL (ref 0.10–4.00)

## 2022-06-15 ENCOUNTER — Encounter: Payer: Self-pay | Admitting: Internal Medicine

## 2022-07-01 DIAGNOSIS — H52203 Unspecified astigmatism, bilateral: Secondary | ICD-10-CM | POA: Diagnosis not present

## 2022-07-01 DIAGNOSIS — E119 Type 2 diabetes mellitus without complications: Secondary | ICD-10-CM | POA: Diagnosis not present

## 2022-07-01 DIAGNOSIS — H25813 Combined forms of age-related cataract, bilateral: Secondary | ICD-10-CM | POA: Diagnosis not present

## 2022-07-01 DIAGNOSIS — H5213 Myopia, bilateral: Secondary | ICD-10-CM | POA: Diagnosis not present

## 2022-07-01 LAB — HM DIABETES EYE EXAM

## 2022-07-02 DIAGNOSIS — I959 Hypotension, unspecified: Secondary | ICD-10-CM | POA: Diagnosis not present

## 2022-07-02 DIAGNOSIS — R55 Syncope and collapse: Secondary | ICD-10-CM | POA: Diagnosis not present

## 2022-07-02 DIAGNOSIS — W19XXXA Unspecified fall, initial encounter: Secondary | ICD-10-CM | POA: Diagnosis not present

## 2022-07-08 ENCOUNTER — Encounter: Payer: Self-pay | Admitting: Internal Medicine

## 2022-07-20 ENCOUNTER — Other Ambulatory Visit: Payer: Self-pay | Admitting: Internal Medicine

## 2022-07-21 ENCOUNTER — Ambulatory Visit (INDEPENDENT_AMBULATORY_CARE_PROVIDER_SITE_OTHER): Payer: Medicare Other | Admitting: Psychology

## 2022-07-21 DIAGNOSIS — F4310 Post-traumatic stress disorder, unspecified: Secondary | ICD-10-CM

## 2022-07-21 DIAGNOSIS — Z630 Problems in relationship with spouse or partner: Secondary | ICD-10-CM | POA: Diagnosis not present

## 2022-07-21 DIAGNOSIS — F4323 Adjustment disorder with mixed anxiety and depressed mood: Secondary | ICD-10-CM

## 2022-07-22 NOTE — Progress Notes (Signed)
[place] Counselor/Therapist Progress Note  Patient ID: CASON SEAGREN, MRN: [id number],    Date: 07/21/2022  Time Spent: 60 minutes  Treatment Type: Individual Therapy  Reported Symptoms: The patient attended a face-to-face individual therapy session in the office today.  The patient reports having feelings of insecurity and depression.  Mental Status Exam: Appearance:  Casual     Behavior: Appropriate  Motor: Normal  Speech/Language:  Normal Rate  Affect: Blunt  Mood: pleasant  Thought process: normal  Thought content:   WNL  Sensory/Perceptual disturbances:   WNL  Orientation: oriented to person, place, time/date, and situation  Attention: Good  Concentration: Good  Memory: WNL  Fund of knowledge:  Good  Insight:   Good  Judgment:  Good  Impulse Control: Good   Risk Assessment: Danger to Self:  No Self-injurious Behavior: No Danger to Others: No Duty to Warn:no Physical Aggression / Violence:No  Access to Firearms a concern: No  Gang Involvement:No   Subjective: The patient attended a face-to-face individual therapy session in the office today.  The patient presents as pleasant and cooperative.  The patient reports that he feels like he has been doing well since I saw him last.  The patient states that he and his wife have been doing well and that they have been out of town taking care of their daughters because both daughters have had issues with break-ups with their boyfriends recently.  We talked about him feeling like he has been able to make good decisions and has not been as reactive like he used to be.  He feels like he is doing well and has made progress.  We talked about whether he wanted to graduate or whether he felt like he needed to continue therapy and he says that he would like to continue to do check-in's once in a while and we scheduled an appointment in several weeks.  The patient is doing very well.  Interventions: Cognitive  Behavioral Therapy, Assertiveness/Communication, and Insight-Oriented, EMDR  Diagnosis:PTSD (post-traumatic stress disorder)  Adjustment disorder with mixed anxiety and depressed mood  Marital conflict  Plan: Client Abilities/Strengths  Insightful, motivated, supportive husband  Client Treatment Preferences  Outpatient Individual therapy/EMDR  Client Statement of Needs  "I am having nightmares and my therapist wondered if EMDR would be helpful"  Treatment Level  Outpatient Individual therapy  Symptoms  Demonstrates an exaggerated startle response.: No Description Entered (Status: improved). Depressed  or irritable mood.:  Displays a  significant decline in interest and engagement in activities.: (Status: maintained). Displays significant psychological and/or physiological distress resulting from internal and external  clues that are reminiscent of the traumatic event.:(Status: maintained).  Experiences disturbances in sleep.: (Status: maintained). Experiences disturbing  and persistent thoughts, images, and/or perceptions of the traumatic event.:  (Status:maintained). Experiences frequent nightmares.: (Status: improved).  Feelings of hopelessness, worthlessness, or inappropriate guilt.: (Status:  improved). Has been exposed to a traumatic event involving actual or perceived threat of death or  serious injury.:  (Status: maintained). Impairment in social, occupational, or  other areas of functioning.: . Intentionally avoids activities,  places, people, or objects (e.g., up-armored vehicles) that evoke memories of the event.: (Status: improved). Reports difficulty concentrating as  well as feelings of guilt.:  Problems Addressed  Goals 1. Develop healthy thinking patterns and beliefs about self, others, and the world that lead to the alleviation and help prevent the relapse of  depression. Objective Identify and replace thoughts and beliefs that support depression. Target  Date:09/17/2022 Frequency: Weekly Progress: 60 Modality: individual Related Interventions 1. Explore and restructure underlying assumptions and beliefs reflected in biased self-talk that  may put the client at risk for relapse or recurrence. 2. Conduct Cognitive-Behavioral Therapy (see Cognitive Behavior Therapy by Olevia Bowens; Overcoming Depression by Lynita Lombard al.), beginning with helping the client learn the connection among  cognition, depressive feelings, and actions. 2. Eliminate or reduce the negative impact trauma related symptoms have  on social, occupational, and family functioning. Objective Learn and implement personal skills to manage challenging situations related to trauma. Target Date: 09/17/2022 Frequency: Weekly Progress: 50 Modality: individual 3. No longer avoids persons, places, activities, and objects that are  reminiscent of the traumatic event. Objective Participate in Eye Movement Desensitization and Reprocessing (EMDR) to reduce emotional distress  related to traumatic thoughts, feelings, and images. Target Date: 09/17/2022 Frequency: Weekly Progress: 60 Modality: individual   Related Interventions 1. Utilize Eye Movement Desensitization and Reprocessing (EMDR) to reduce the client's  emotional reactivity to the traumatic event and reduce PTSD symptoms. Objective Learn and implement guided self-dialogue to manage thoughts, feelings, and urges brought on by  encounters with trauma-related situations. Target Date: 05/16/2024Frequency: Weekly Progress: 50 Modality: individual Related Interventions 1. Teach the client a guided self-dialogue procedure in which he/she learns to recognize  maladaptive self-talk, challenges its biases, copes with engendered feelings, overcomes  avoidance, and reinforces his/her accomplishments; review and reinforce progress, problemsolve obstacles. 4. No longer experiences intrusive event recollections, avoidance of event  reminders,  intense arousal, or disinterest in activities or  relationships. 5. Thinks about or openly discusses the traumatic event with others  without experiencing psychological or physiological distress. Diagnosis:  Adjustment Disorder with mixed anxiety and depression Conditions For Discharge Achievement of treatment goals and objectives  Kennadi Albany G Kang Ishida, LCSW

## 2022-07-23 ENCOUNTER — Other Ambulatory Visit: Payer: Self-pay | Admitting: Internal Medicine

## 2022-07-23 ENCOUNTER — Telehealth: Payer: Self-pay | Admitting: Internal Medicine

## 2022-07-23 NOTE — Telephone Encounter (Signed)
Patient is calling to see why his Fluoxetine 40mg  was not refilled. It appears to have been taken off of his med list. He is still taking it and he was seen on 06/03/22 and was confirmed to still have that on his med list. He would like for it to be filled at [facility], [address]. If it can not be filled, he would like a call back at [phone].

## 2022-07-23 NOTE — Telephone Encounter (Signed)
Med is not on med list. Pls advise.Marland KitchenJohny Chess

## 2022-07-25 MED ORDER — FLUOXETINE HCL 40 MG PO CAPS: 40.0000 mg | ORAL_CAPSULE | Freq: Every day | ORAL | 1 refills | Status: DC

## 2022-07-25 NOTE — Telephone Encounter (Signed)
Okay.  Done.  Thanks 

## 2022-07-28 ENCOUNTER — Ambulatory Visit (INDEPENDENT_AMBULATORY_CARE_PROVIDER_SITE_OTHER): Payer: Medicare Other | Admitting: Psychology

## 2022-07-28 DIAGNOSIS — F4310 Post-traumatic stress disorder, unspecified: Secondary | ICD-10-CM

## 2022-07-28 DIAGNOSIS — Z630 Problems in relationship with spouse or partner: Secondary | ICD-10-CM

## 2022-07-28 DIAGNOSIS — F4323 Adjustment disorder with mixed anxiety and depressed mood: Secondary | ICD-10-CM

## 2022-07-28 NOTE — Progress Notes (Signed)
[facility] Counselor/Therapist Progress Note  Patient ID: Jared Alvarez, MRN: [id number],    Date: 07/28/2022  Time Spent: 60 minutes  Treatment Type: Individual Therapy  Reported Symptoms: The patient attended a face-to-face individual therapy session in the office today.  The patient reports having feelings of insecurity and depression.  Mental Status Exam: Appearance:  Casual     Behavior: Appropriate  Motor: Normal  Speech/Language:  Normal Rate  Affect: Blunt  Mood: pleasant  Thought process: normal  Thought content:   WNL  Sensory/Perceptual disturbances:   WNL  Orientation: oriented to person, place, time/date, and situation  Attention: Good  Concentration: Good  Memory: WNL  Fund of knowledge:  Good  Insight:   Good  Judgment:  Good  Impulse Control: Good   Risk Assessment: Danger to Self:  No Self-injurious Behavior: No Danger to Others: No Duty to Warn:no Physical Aggression / Violence:No  Access to Firearms a concern: No  Gang Involvement:No   Subjective: The patient attended a face-to-face individual therapy session in the office today.  The patient presents as pleasant and cooperative.  The patient called the office today wanting to get and quickly.  The patient reports that he had a dream last night and he wanted to have a conversation about the dream and also about the dynamic that is going on between he and his wife.  The patient seems to be making excellent progress with the EMDR that we have done.  He does not seem to be as insecure about making decisions and we talked about the dream being clearly  representative of what is going on internally.  We talked about some of the dynamics that he has going on with his wife and we talked about how to respond to her as opposed to absorbing what she is saying as a criticism and feeling bad about himself.  He understood the concepts discussed and will call the office if he would like to schedule more  appointments. Interventions: Cognitive Behavioral Therapy, Assertiveness/Communication, and Insight-Oriented, EMDR  Diagnosis:PTSD (post-traumatic stress disorder)  Adjustment disorder with mixed anxiety and depressed mood  Marital conflict  Plan: Client Abilities/Strengths  Insightful, motivated, supportive husband  Client Treatment Preferences  Outpatient Individual therapy/EMDR  Client Statement of Needs  "I am having nightmares and my therapist wondered if EMDR would be helpful"  Treatment Level  Outpatient Individual therapy  Symptoms  Demonstrates an exaggerated startle response.: No Description Entered (Status: improved). Depressed  or irritable mood.:  Displays a  significant decline in interest and engagement in activities.: (Status: maintained). Displays significant psychological and/or physiological distress resulting from internal and external  clues that are reminiscent of the traumatic event.:(Status: maintained).  Experiences disturbances in sleep.: (Status: maintained). Experiences disturbing  and persistent thoughts, images, and/or perceptions of the traumatic event.:  (Status:maintained). Experiences frequent nightmares.: (Status: improved).  Feelings of hopelessness, worthlessness, or inappropriate guilt.: (Status:  improved). Has been exposed to a traumatic event involving actual or perceived threat of death or  serious injury.:  (Status: maintained). Impairment in social, occupational, or  other areas of functioning.: . Intentionally avoids activities,  places, people, or objects (e.g., up-armored vehicles) that evoke memories of the event.: (Status: improved). Reports difficulty concentrating as  well as feelings of guilt.:  Problems Addressed  Goals 1. Develop healthy thinking patterns and beliefs about self, others, and the world that lead to the alleviation and help prevent the relapse of  depression. Objective Identify and replace thoughts and beliefs  that support depression. Target Date:09/17/2022 Frequency: Weekly Progress: 60 Modality: individual Related Interventions 1. Explore and restructure underlying assumptions and beliefs reflected in biased self-talk that  may put the client at risk for relapse or recurrence. 2. Conduct Cognitive-Behavioral Therapy (see Cognitive Behavior Therapy by Olevia Bowens; Overcoming Depression by Lynita Lombard al.), beginning with helping the client learn the connection among  cognition, depressive feelings, and actions. 2. Eliminate or reduce the negative impact trauma related symptoms have  on social, occupational, and family functioning. Objective Learn and implement personal skills to manage challenging situations related to trauma. Target Date: 09/17/2022 Frequency: Weekly Progress: 50 Modality: individual 3. No longer avoids persons, places, activities, and objects that are  reminiscent of the traumatic event. Objective Participate in Eye Movement Desensitization and Reprocessing (EMDR) to reduce emotional distress  related to traumatic thoughts, feelings, and images. Target Date: 09/17/2022 Frequency: Weekly Progress: 60 Modality: individual   Related Interventions 1. Utilize Eye Movement Desensitization and Reprocessing (EMDR) to reduce the client's  emotional reactivity to the traumatic event and reduce PTSD symptoms. Objective Learn and implement guided self-dialogue to manage thoughts, feelings, and urges brought on by  encounters with trauma-related situations. Target Date: 05/16/2024Frequency: Weekly Progress: 50 Modality: individual Related Interventions 1. Teach the client a guided self-dialogue procedure in which he/she learns to recognize  maladaptive self-talk, challenges its biases, copes with engendered feelings, overcomes  avoidance, and reinforces his/her accomplishments; review and reinforce progress, problemsolve obstacles. 4. No longer experiences intrusive event recollections,  avoidance of event  reminders, intense arousal, or disinterest in activities or  relationships. 5. Thinks about or openly discusses the traumatic event with others  without experiencing psychological or physiological distress. Diagnosis:  Adjustment Disorder with mixed anxiety and depression Conditions For Discharge Achievement of treatment goals and objectives  Zailah Zagami G Delton Stelle, LCSW

## 2022-08-09 DIAGNOSIS — R051 Acute cough: Secondary | ICD-10-CM | POA: Diagnosis not present

## 2022-08-09 DIAGNOSIS — J209 Acute bronchitis, unspecified: Secondary | ICD-10-CM | POA: Diagnosis not present

## 2022-08-13 NOTE — Progress Notes (Signed)
CARDIOLOGY CONSULT NOTE       Patient ID: Jared Alvarez MRN: [id number] DOB/AGE: 07-04-1950 72 y.o.  Admit date: (Not on file) Referring Physician: [name] Primary Physician: [name], MD Primary Cardiologist: [name]     HPI:  72 y.o. referred by Dr [name] for CAD Seen after 2 year abscence on 07/25/21 .History of stent to LAD in 1997 in [place].  CRF;s include HTN, HLD , DM Known false positive ECG;s on stress testing normal perfusion on test 01/25/18 History of neurally mediated syncope since teenage years Has had PVC;s in past with no significant NSVT  Monitor with nocturnal bradycardia so has not been on beta blocker EF normal on TTE 01/06/18 Thoracic aorta 4.2 cm on CT 2019   Sees Dr Marina Goodell for colon polyps 3 more polyps removed 05/13/21   Lived  in [place] during COVID  Now back in [place] and Saw a cardiologist at [facility] He indicates having a CT scan spring 2023  and aortic aneurysm gone down in size   He still plays the base. He is active swimming daily and renovating a house. BP low and norvasc decreased 09/26/21   He has had a couple of vagal like episodes with low BP He is taking cialis in am and cardura at night for his prostate which is not ideal. Told him to ask Dr [name] about proscar or non vasodilatory Rx Or referral to urology for more definitive Rx that would not require vasodilators   ROS All other systems reviewed and negative except as noted above  Past Medical History:  Diagnosis Date   Allergy    Anxiety    no per pt   Aortic aneurysm    Coronary artery disease    Depression    no per pt   DM type 2 (diabetes mellitus, type 2)    GERD (gastroesophageal reflux disease)    Gout    Hearing loss    Hyperlipidemia    Hypertension    Hypogonadism male    Inguinal hernia    right   LBP (low back pain)    Leg cramps    Osteoarthritis    Skin neoplasm    Tinnitus    since 1997   Vertigo    Vitamin B12 deficiency     Family History   Problem Relation Age of Onset   Heart disease Father    Arthritis Sister    Lupus Sister    Heart disease Brother    Coronary artery disease Other    Hypertension Other    Colon cancer Neg Hx    Esophageal cancer Neg Hx    Stomach cancer Neg Hx    Rectal cancer Neg Hx    Colon polyps Neg Hx     Social History   Socioeconomic History   Marital status: Married    Spouse name: Not on file   Number of children: Not on file   Years of education: Not on file   Highest education level: Not on file  Occupational History   Occupation: [employer] education officer: [employer]  Tobacco Use   Smoking status: Never   Smokeless tobacco: Never  Vaping Use   Vaping Use: Never used  Substance and Sexual Activity   Alcohol use: Yes    Alcohol/week: 7.0 standard drinks of alcohol    Types: 7 Glasses of wine per week    Comment: socially   Drug use: No  Sexual activity: Yes  Other Topics Concern   Not on file  Social History Narrative   Not on file   Social Determinants of Health   Financial Resource Strain: Low Risk  (02/05/2021)   Overall Financial Resource Strain (CARDIA)    Difficulty of Paying Living Expenses: Not hard at all  Food Insecurity: No Food Insecurity (02/05/2021)   Hunger Vital Sign    Worried About Running Out of Food in the Last Year: Never true    Ran Out of Food in the Last Year: Never true  Transportation Needs: No Transportation Needs (02/05/2021)   PRAPARE - Administrator, Civil Service (Medical): No    Lack of Transportation (Non-Medical): No  Physical Activity: Sufficiently Active (02/05/2021)   Exercise Vital Sign    Days of Exercise per Week: 7 days    Minutes of Exercise per Session: 30 min  Stress: No Stress Concern Present (02/05/2021)   [employer] of Occupational Health - Occupational Stress Questionnaire    Feeling of Stress : Not at all  Social Connections: Unknown (02/05/2021)   Social Connection and  Isolation Panel [NHANES]    Frequency of Communication with Friends and Family: More than three times a week    Frequency of Social Gatherings with Friends and Family: More than three times a week    Attends Religious Services: Patient declined    Database administrator or Organizations: Patient declined    Attends Banker Meetings: Patient declined    Marital Status: Married  Catering manager Violence: Not At Risk (02/05/2021)   Humiliation, Afraid, Rape, and Kick questionnaire    Fear of Current or Ex-Partner: No    Emotionally Abused: No    Physically Abused: No    Sexually Abused: No    Past Surgical History:  Procedure Laterality Date   COLONOSCOPY     FOOT SURGERY     07-2019 triple bone fusion   FOOT SURGERY  08/2020   repeat fusion   heart stent     in 1997   INGUINAL HERNIA REPAIR     POLYPECTOMY        Current Outpatient Medications:    acetaminophen (TYLENOL) 500 MG tablet, Take 1 tablet (500 mg total) by mouth every 6 (six) hours as needed., Disp: 30 tablet, Rfl: 2   aspirin 81 MG tablet, Take 1 tablet (81 mg total) by mouth daily., Disp: 30 tablet, Rfl: 2   colchicine 0.6 MG tablet, Take 1 tablet (0.6 mg total) by mouth 2 (two) times daily. If patient has flare up with gout, Disp: 30 tablet, Rfl: 5   Cyanocobalamin (VITAMIN B-12) 500 MCG SUBL, Place 1 tablet (500 mcg total) under the tongue daily., Disp: 150 tablet, Rfl: 2   doxazosin (CARDURA) 2 MG tablet, Take 1 tablet (2 mg total) by mouth daily., Disp: 90 tablet, Rfl: 3   FLUoxetine (PROZAC) 40 MG capsule, Take 1 capsule (40 mg total) by mouth daily., Disp: 90 capsule, Rfl: 1   Homeopathic Products (ARNICARE ARNICA) OINT, Apply 1 application topically 2 (two) times daily as needed (arthritis)., Disp: , Rfl:    ibuprofen (ADVIL) 200 MG tablet, Ibuprofen cream PRN, Disp: , Rfl:    IBUPROFEN-LINIMENT CO, by Combination route., Disp: , Rfl:    LORazepam (ATIVAN) 1 MG tablet, Take 0.5-1 tablets (0.5-1 mg  total) by mouth 2 (two) times daily as needed for anxiety., Disp: 60 tablet, Rfl: 3   losartan (COZAAR) 100 MG tablet, TAKE  1 TABLET BY MOUTH EVERY DAY, Disp: 90 tablet, Rfl: 3   metFORMIN (GLUCOPHAGE) 1000 MG tablet, TAKE ONE TABLET BY MOUTH TWICE A DAY WITH A MEAL, Disp: 180 tablet, Rfl: 3   nitroGLYCERIN (NITROSTAT) 0.4 MG SL tablet, Place 1 tablet (0.4 mg total) under the tongue every 5 (five) minutes as needed for chest pain., Disp: 25 tablet, Rfl: 3   pantoprazole (PROTONIX) 40 MG tablet, Take 1 tablet (40 mg total) by mouth 2 (two) times daily., Disp: 180 tablet, Rfl: 3   rosuvastatin (CRESTOR) 20 MG tablet, Take 1 tablet (20 mg total) by mouth 3 (three) times a week. Mon, Wed, and Fri, Disp: 90 tablet, Rfl: 1   tadalafil (CIALIS) 20 MG tablet, Take 1 tablet (20 mg total) by mouth every other day as needed for erectile dysfunction., Disp: 90 tablet, Rfl: 1   tadalafil (CIALIS) 5 MG tablet, Take 5 mg by mouth daily at 6 (six) AM., Disp: , Rfl:    triamcinolone (NASACORT) 55 MCG/ACT AERO nasal inhaler, Place 2 sprays into the nose daily., Disp: 1 each, Rfl: 12   triamcinolone cream (KENALOG) 0.1 %, Apply 1 application topically 2 (two) times daily., Disp: 100 g, Rfl: 0   amLODipine (NORVASC) 10 MG tablet, Take 10 mg by mouth daily. (Patient not taking: Reported on 08/26/2022), Disp: , Rfl:      Physical Exam: Blood pressure (!) 142/82, pulse (!) 57, height 5\' 8"  (1.727 m), weight 170 lb (77.1 kg), SpO2 98 %.    Affect appropriate Healthy:  appears stated age HEENT: normal Neck supple with no adenopathy JVP normal no bruits no thyromegaly Lungs clear with no wheezing and good diaphragmatic motion Heart:  S1/S2 no murmur, no rub, gallop or click PMI normal Abdomen: benighn, BS positve, no tenderness, no AAA no bruit.  No HSM or HJR Distal pulses intact with no bruits No edema Neuro non-focal Skin warm and dry No muscular weakness   Labs:   Lab Results  Component Value Date    WBC 4.7 06/11/2022   HGB 11.8 (L) 06/11/2022   HCT 34.5 (L) 06/11/2022   MCV 94.4 06/11/2022   PLT 205.0 06/11/2022    No results for input(s): "NA", "K", "CL", "CO2", "BUN", "CREATININE", "CALCIUM", "PROT", "BILITOT", "ALKPHOS", "ALT", "AST", "GLUCOSE" in the last 168 hours.  Invalid input(s): "LABALBU"  No results found for: "CKTOTAL", "CKMB", "CKMBINDEX", "TROPONINI"  Lab Results  Component Value Date   CHOL 147 06/11/2022   CHOL 142 07/23/2021   CHOL 139 10/15/2020   Lab Results  Component Value Date   HDL 75.00 06/11/2022   HDL 67.90 07/23/2021   HDL 57.40 10/15/2020   Lab Results  Component Value Date   LDLCALC 64 06/11/2022   LDLCALC 68 07/23/2021   LDLCALC 72 10/15/2020   Lab Results  Component Value Date   TRIG 41.0 06/11/2022   TRIG 29.0 07/23/2021   TRIG 47.0 10/15/2020   Lab Results  Component Value Date   CHOLHDL 2 06/11/2022   CHOLHDL 2 07/23/2021   CHOLHDL 2 10/15/2020   Lab Results  Component Value Date   LDLDIRECT 149.6 12/15/2011      Radiology: No results found.  EKG: 08/26/2022 rate 57 normal    ASSESSMENT AND PLAN:   CAD: stent to [place]AD 1997 [place]. Normal perfusion on myovue 2019 with known false positive treadmill tests No beta blocker with ambient bradycardia.  ASA/Statin Active no angina observe  HTN:  BP low d/c norvasc all together  DM:  Discussed low carb diet.  Target hemoglobin A1c is 6.5 or less.  Continue current medications.  A1c 6.4  GI:  post colonoscopy with polyp removal f/u Dr Marina Goodell  HLD:  on statin LDL 72 10/15/20 Aneurysm:  Ascending thoracic aorta 4.2 cm 2019  stable by CT done at [facility] Spring 2023  will request records  Prostate:  f/u primary consider stopping cardura/cialis and using proscar or non vasodilatory Rx or referral to GU given his propensity for vagal episodes and pre syncope   F/U in a year   Signed: Charlton Haws 08/26/2022, 9:04 AM

## 2022-08-26 ENCOUNTER — Ambulatory Visit: Payer: Medicare Other | Attending: Cardiovascular Disease | Admitting: Cardiovascular Disease

## 2022-08-26 ENCOUNTER — Encounter: Payer: Self-pay | Admitting: Internal Medicine

## 2022-08-26 ENCOUNTER — Encounter: Payer: Self-pay | Admitting: Cardiovascular Disease

## 2022-08-26 VITALS — BP 142/82 | HR 57 | Ht 68.0 in | Wt 170.0 lb

## 2022-08-26 DIAGNOSIS — I2510 Atherosclerotic heart disease of native coronary artery without angina pectoris: Secondary | ICD-10-CM | POA: Diagnosis not present

## 2022-08-26 DIAGNOSIS — I712 Thoracic aortic aneurysm, without rupture, unspecified: Secondary | ICD-10-CM | POA: Diagnosis not present

## 2022-08-26 DIAGNOSIS — E785 Hyperlipidemia, unspecified: Secondary | ICD-10-CM | POA: Insufficient documentation

## 2022-08-26 NOTE — Patient Instructions (Signed)
Medication Instructions:  Your physician recommends that you continue on your current medications as directed. Please refer to the Current Medication list given to you today.  *If you need a refill on your cardiac medications before your next appointment, please call your pharmacy*  Lab Work: If you have labs (blood work) drawn today and your tests are completely normal, you will receive your results only by: MyChart Message (if you have MyChart) OR A paper copy in the mail If you have any lab test that is abnormal or we need to change your treatment, we will call you to review the results.  Testing/Procedures: None ordered today.  Follow-Up: At [facility], you and your health needs are our priority.  As part of our continuing mission to provide you with exceptional heart care, we have created designated Provider Care Teams.  These Care Teams include your primary Cardiologist (physician) and Advanced Practice Providers (APPs -  Physician Assistants and Nurse Practitioners) who all work together to provide you with the care you need, when you need it.  We recommend signing up for the patient portal called "MyChart".  Sign up information is provided on this After Visit Summary.  MyChart is used to connect with patients for Virtual Visits (Telemedicine).  Patients are able to view lab/test results, encounter notes, upcoming appointments, etc.  Non-urgent messages can be sent to your provider as well.   To learn more about what you can do with MyChart, go to [email or web address].    Your next appointment:   1 year(s)  Provider:   Peter Nishan, MD      

## 2022-08-29 ENCOUNTER — Other Ambulatory Visit: Payer: Self-pay | Admitting: Internal Medicine

## 2022-08-29 DIAGNOSIS — N320 Bladder-neck obstruction: Secondary | ICD-10-CM

## 2022-08-29 DIAGNOSIS — N529 Male erectile dysfunction, unspecified: Secondary | ICD-10-CM

## 2022-09-01 ENCOUNTER — Ambulatory Visit (INDEPENDENT_AMBULATORY_CARE_PROVIDER_SITE_OTHER): Payer: Medicare Other | Admitting: Psychology

## 2022-09-01 DIAGNOSIS — F4323 Adjustment disorder with mixed anxiety and depressed mood: Secondary | ICD-10-CM | POA: Diagnosis not present

## 2022-09-01 DIAGNOSIS — Z630 Problems in relationship with spouse or partner: Secondary | ICD-10-CM

## 2022-09-01 DIAGNOSIS — F4310 Post-traumatic stress disorder, unspecified: Secondary | ICD-10-CM

## 2022-09-01 NOTE — Progress Notes (Signed)
[facility] Counselor/Therapist Progress Note  Patient ID: Jared Alvarez, MRN: [id number],    Date: 09/01/2022  Time Spent: 60 minutes  Treatment Type: Individual Therapy  Reported Symptoms: The patient attended a face-to-face individual therapy session in the office today.  The patient reports having feelings of insecurity and depression.  Mental Status Exam: Appearance:  Casual     Behavior: Appropriate  Motor: Normal  Speech/Language:  Normal Rate  Affect: Blunt  Mood: pleasant  Thought process: normal  Thought content:   WNL  Sensory/Perceptual disturbances:   WNL  Orientation: oriented to person, place, time/date, and situation  Attention: Good  Concentration: Good  Memory: WNL  Fund of knowledge:  Good  Insight:   Good  Judgment:  Good  Impulse Control: Good   Risk Assessment: Danger to Self:  No Self-injurious Behavior: No Danger to Others: No Duty to Warn:no Physical Aggression / Violence:No  Access to Firearms a concern: No  Gang Involvement:No   Subjective: The patient attended a face-to-face individual therapy session in the office today.  The patient presents as pleasant and cooperative.  The patient reports that things have been going okay since I last saw him.  He seems to be doing much better with not taking things as personally and not reacting as much as he used to do.  He also reports that his dreams have changed and now he is having more positive dreams that are meaningful and not as negative.  He talked about his wife and concerns that he has that she might need some assistance and refuses to get it.  I think he has decided to stay married and would like for her to be able to get the help that she needs but he is not taking her issues personally anymore.  This is directly related to the EMDR that we have done.  The patient request to stay on my schedule at least every month and is going to call the office to schedule.  Interventions:  Cognitive Behavioral Therapy, Assertiveness/Communication, and Insight-Oriented, EMDR  Diagnosis:PTSD (post-traumatic stress disorder)  Marital conflict  Adjustment disorder with mixed anxiety and depressed mood  Plan: Client Abilities/Strengths  Insightful, motivated, supportive husband  Client Treatment Preferences  Outpatient Individual therapy/EMDR  Client Statement of Needs  "I am having nightmares and my therapist wondered if EMDR would be helpful"  Treatment Level  Outpatient Individual therapy  Symptoms  Demonstrates an exaggerated startle response.: No Description Entered (Status: improved). Depressed  or irritable mood.:  Displays a  significant decline in interest and engagement in activities.: (Status: maintained). Displays significant psychological and/or physiological distress resulting from internal and external  clues that are reminiscent of the traumatic event.:(Status: maintained).  Experiences disturbances in sleep.: (Status: maintained). Experiences disturbing  and persistent thoughts, images, and/or perceptions of the traumatic event.:  (Status:maintained). Experiences frequent nightmares.: (Status: improved).  Feelings of hopelessness, worthlessness, or inappropriate guilt.: (Status:  improved). Has been exposed to a traumatic event involving actual or perceived threat of death or  serious injury.:  (Status: maintained). Impairment in social, occupational, or  other areas of functioning.: . Intentionally avoids activities,  places, people, or objects (e.g., up-armored vehicles) that evoke memories of the event.: (Status: improved). Reports difficulty concentrating as  well as feelings of guilt.:  Problems Addressed  Goals 1. Develop healthy thinking patterns and beliefs about self, others, and the world that lead to the alleviation and help prevent the relapse of  depression. Objective Identify  and replace thoughts and beliefs that support  depression. Target Date:09/17/2023 Frequency: monthly Progress: 80 Modality: individual Related Interventions 1. Explore and restructure underlying assumptions and beliefs reflected in biased self-talk that  may put the client at risk for relapse or recurrence. 2. Conduct Cognitive-Behavioral Therapy (see Cognitive Behavior Therapy by Reola Calkins; Overcoming Depression by Agapito Games al.), beginning with helping the client learn the connection among  cognition, depressive feelings, and actions. 2. Eliminate or reduce the negative impact trauma related symptoms have  on social, occupational, and family functioning. Objective Learn and implement personal skills to manage challenging situations related to trauma. Target Date: 09/17/2023 Frequency: monthly Progress: 80 Modality: individual 3. No longer avoids persons, places, activities, and objects that are  reminiscent of the traumatic event. Objective Participate in Eye Movement Desensitization and Reprocessing (EMDR) to reduce emotional distress  related to traumatic thoughts, feelings, and images. Target Date: 09/17/2023 Frequency: monthly Progress: 80 Modality: individual   Related Interventions 1. Utilize Eye Movement Desensitization and Reprocessing (EMDR) to reduce the client's  emotional reactivity to the traumatic event and reduce PTSD symptoms. Objective Learn and implement guided self-dialogue to manage thoughts, feelings, and urges brought on by  encounters with trauma-related situations. Target Date: 09/17/2023 Frequency: monthly Progress: 80 Modality: individual Related Interventions 1. Teach the client a guided self-dialogue procedure in which he/she learns to recognize  maladaptive self-talk, challenges its biases, copes with engendered feelings, overcomes  avoidance, and reinforces his/her accomplishments; review and reinforce progress, problemsolve obstacles. 4. No longer experiences intrusive event recollections,  avoidance of event  reminders, intense arousal, or disinterest in activities or  relationships. 5. Thinks about or openly discusses the traumatic event with others  without experiencing psychological or physiological distress. Diagnosis:  Adjustment Disorder with mixed anxiety and depression Conditions For Discharge Achievement of treatment goals and objectives  Jared Alvarez G Xoie Kreuser, LCSW

## 2022-09-22 ENCOUNTER — Ambulatory Visit (INDEPENDENT_AMBULATORY_CARE_PROVIDER_SITE_OTHER): Payer: Medicare Other | Admitting: Psychology

## 2022-09-22 DIAGNOSIS — F4323 Adjustment disorder with mixed anxiety and depressed mood: Secondary | ICD-10-CM | POA: Diagnosis not present

## 2022-09-22 DIAGNOSIS — Z630 Problems in relationship with spouse or partner: Secondary | ICD-10-CM

## 2022-09-22 DIAGNOSIS — F4310 Post-traumatic stress disorder, unspecified: Secondary | ICD-10-CM | POA: Diagnosis not present

## 2022-09-23 NOTE — Progress Notes (Signed)
[facility] Counselor/Therapist Progress Note  Patient ID: KAILASH GLASSBURN, MRN: [id number],    Date:09/22/2022  Time Spent: 60 minutes  Treatment Type: Individual Therapy  Reported Symptoms: The patient attended a face-to-face individual therapy session in the office today.  The patient reports having feelings of insecurity and depression.  Mental Status Exam: Appearance:  Casual     Behavior: Appropriate  Motor: Normal  Speech/Language:  Normal Rate  Affect: Blunt  Mood: pleasant  Thought process: normal  Thought content:   WNL  Sensory/Perceptual disturbances:   WNL  Orientation: oriented to person, place, time/date, and situation  Attention: Good  Concentration: Good  Memory: WNL  Fund of knowledge:  Good  Insight:   Good  Judgment:  Good  Impulse Control: Good   Risk Assessment: Danger to Self:  No Self-injurious Behavior: No Danger to Others: No Duty to Warn:no Physical Aggression / Violence:No  Access to Firearms a concern: No  Gang Involvement:No   Subjective: The patient attended a face-to-face individual therapy session in the office today.  The patient presents as pleasant and cooperative.  The patient reports that he wanted to make today to talk about the situation with his wife.  Apparently she is not dealing with some of her own issues.  He talked about her saying that he needed to work on being less sensitive.  We talked about ways for him to respond to her in a healthier way as opposed to him taking on her problems.  I taught him how to do some reflective listening.  I also recommended that he be mindful that her problems are not his to fix and that he needs to do what he needs to do to take care of himself and to put things back on her to make her own problems better.  She is not willing to go to therapy or to go to couples therapy.  Encouraged him to be supportive but not take on her problems.  Interventions: Cognitive Behavioral Therapy,  Assertiveness/Communication, and Insight-Oriented, EMDR  Diagnosis:Marital conflict  PTSD (post-traumatic stress disorder)  Adjustment disorder with mixed anxiety and depressed mood  Plan: Client Abilities/Strengths  Insightful, motivated, supportive husband  Client Treatment Preferences  Outpatient Individual therapy/EMDR  Client Statement of Needs  "I am having nightmares and my therapist wondered if EMDR would be helpful"  Treatment Level  Outpatient Individual therapy  Symptoms  Demonstrates an exaggerated startle response.: No Description Entered (Status: improved). Depressed  or irritable mood.:  Displays a  significant decline in interest and engagement in activities.: (Status: maintained). Displays significant psychological and/or physiological distress resulting from internal and external  clues that are reminiscent of the traumatic event.:(Status: maintained).  Experiences disturbances in sleep.: (Status: maintained). Experiences disturbing  and persistent thoughts, images, and/or perceptions of the traumatic event.:  (Status:maintained). Experiences frequent nightmares.: (Status: improved).  Feelings of hopelessness, worthlessness, or inappropriate guilt.: (Status:  improved). Has been exposed to a traumatic event involving actual or perceived threat of death or  serious injury.:  (Status: maintained). Impairment in social, occupational, or  other areas of functioning.: . Intentionally avoids activities,  places, people, or objects (e.g., up-armored vehicles) that evoke memories of the event.: (Status: improved). Reports difficulty concentrating as  well as feelings of guilt.:  Problems Addressed  Goals 1. Develop healthy thinking patterns and beliefs about self, others, and the world that lead to the alleviation and help prevent the relapse of  depression. Objective Identify and replace thoughts and  beliefs that support depression. Target Date:09/17/2023 Frequency:  monthly Progress: 80 Modality: individual Related Interventions 1. Explore and restructure underlying assumptions and beliefs reflected in biased self-talk that  may put the client at risk for relapse or recurrence. 2. Conduct Cognitive-Behavioral Therapy (see Cognitive Behavior Therapy by Reola Calkins; Overcoming Depression by Agapito Games al.), beginning with helping the client learn the connection among  cognition, depressive feelings, and actions. 2. Eliminate or reduce the negative impact trauma related symptoms have  on social, occupational, and family functioning. Objective Learn and implement personal skills to manage challenging situations related to trauma. Target Date: 09/17/2023 Frequency: monthly Progress: 80 Modality: individual 3. No longer avoids persons, places, activities, and objects that are  reminiscent of the traumatic event. Objective Participate in Eye Movement Desensitization and Reprocessing (EMDR) to reduce emotional distress  related to traumatic thoughts, feelings, and images. Target Date: 09/17/2023 Frequency: monthly Progress: 80 Modality: individual   Related Interventions 1. Utilize Eye Movement Desensitization and Reprocessing (EMDR) to reduce the client's  emotional reactivity to the traumatic event and reduce PTSD symptoms. Objective Learn and implement guided self-dialogue to manage thoughts, feelings, and urges brought on by  encounters with trauma-related situations. Target Date: 09/17/2023 Frequency: monthly Progress: 80 Modality: individual Related Interventions 1. Teach the client a guided self-dialogue procedure in which he/she learns to recognize  maladaptive self-talk, challenges its biases, copes with engendered feelings, overcomes  avoidance, and reinforces his/her accomplishments; review and reinforce progress, problemsolve obstacles. 4. No longer experiences intrusive event recollections, avoidance of event  reminders, intense arousal, or  disinterest in activities or  relationships. 5. Thinks about or openly discusses the traumatic event with others  without experiencing psychological or physiological distress. Diagnosis:  Adjustment Disorder with mixed anxiety and depression Conditions For Discharge Achievement of treatment goals and objectives  Crue Otero G Ismael Karge, LCSW

## 2022-09-25 ENCOUNTER — Telehealth: Payer: Self-pay | Admitting: Radiology

## 2022-09-25 NOTE — Telephone Encounter (Signed)
Contacted Jared Alvarez to schedule their annual wellness visit. Patient declined to schedule AWV at this time.  Vergia Chea K. CMA

## 2022-10-01 DIAGNOSIS — N401 Enlarged prostate with lower urinary tract symptoms: Secondary | ICD-10-CM | POA: Diagnosis not present

## 2022-10-01 DIAGNOSIS — N5201 Erectile dysfunction due to arterial insufficiency: Secondary | ICD-10-CM | POA: Diagnosis not present

## 2022-10-01 DIAGNOSIS — R3912 Poor urinary stream: Secondary | ICD-10-CM | POA: Diagnosis not present

## 2022-10-04 ENCOUNTER — Encounter: Payer: Self-pay | Admitting: Internal Medicine

## 2022-10-07 ENCOUNTER — Ambulatory Visit (INDEPENDENT_AMBULATORY_CARE_PROVIDER_SITE_OTHER): Payer: Medicare Other | Admitting: Internal Medicine

## 2022-10-07 ENCOUNTER — Encounter: Payer: Self-pay | Admitting: Internal Medicine

## 2022-10-07 VITALS — BP 120/68 | HR 55 | Temp 98.2°F | Ht 68.0 in | Wt 167.0 lb

## 2022-10-07 DIAGNOSIS — Z7984 Long term (current) use of oral hypoglycemic drugs: Secondary | ICD-10-CM | POA: Diagnosis not present

## 2022-10-07 DIAGNOSIS — E1159 Type 2 diabetes mellitus with other circulatory complications: Secondary | ICD-10-CM

## 2022-10-07 DIAGNOSIS — I10 Essential (primary) hypertension: Secondary | ICD-10-CM | POA: Diagnosis not present

## 2022-10-07 DIAGNOSIS — B351 Tinea unguium: Secondary | ICD-10-CM | POA: Diagnosis not present

## 2022-10-07 MED ORDER — JUBLIA 10 % EX SOLN: 1.0000 "application " | Freq: Every morning | CUTANEOUS | 2 refills | Status: DC

## 2022-10-07 NOTE — Assessment & Plan Note (Signed)
Check A1c On Metformin Well-controlled

## 2022-10-07 NOTE — Progress Notes (Signed)
Subjective:  Patient ID: Jared Alvarez, male    DOB: 1950-09-22  Age: 72 y.o. MRN: [id number]  CC: Nail Problem   HPI Jared Alvarez presents for syncope on Doxazosyn - pt stopped; seeing Dr Cardell Peach (Urology) C/o onychomycosis - R foot  Outpatient Medications Prior to Visit  Medication Sig Dispense Refill   acetaminophen (TYLENOL) 500 MG tablet Take 1 tablet (500 mg total) by mouth every 6 (six) hours as needed. 30 tablet 2   alfuzosin (UROXATRAL) 10 MG 24 hr tablet Take 10 mg by mouth daily.     aspirin 81 MG tablet Take 1 tablet (81 mg total) by mouth daily. 30 tablet 2   colchicine 0.6 MG tablet Take 1 tablet (0.6 mg total) by mouth 2 (two) times daily. If patient has flare up with gout 30 tablet 5   Cyanocobalamin (VITAMIN B-12) 500 MCG SUBL Place 1 tablet (500 mcg total) under the tongue daily. 150 tablet 2   FLUoxetine (PROZAC) 40 MG capsule Take 1 capsule (40 mg total) by mouth daily. 90 capsule 1   Homeopathic Products (ARNICARE ARNICA) OINT Apply 1 application topically 2 (two) times daily as needed (arthritis).     ibuprofen (ADVIL) 200 MG tablet Ibuprofen cream PRN     IBUPROFEN-LINIMENT CO by Combination route.     LORazepam (ATIVAN) 1 MG tablet Take 0.5-1 tablets (0.5-1 mg total) by mouth 2 (two) times daily as needed for anxiety. 60 tablet 3   losartan (COZAAR) 100 MG tablet TAKE 1 TABLET BY MOUTH EVERY DAY 90 tablet 3   metFORMIN (GLUCOPHAGE) 1000 MG tablet TAKE ONE TABLET BY MOUTH TWICE A DAY WITH A MEAL 180 tablet 3   nitroGLYCERIN (NITROSTAT) 0.4 MG SL tablet Place 1 tablet (0.4 mg total) under the tongue every 5 (five) minutes as needed for chest pain. 25 tablet 3   pantoprazole (PROTONIX) 40 MG tablet Take 1 tablet (40 mg total) by mouth 2 (two) times daily. 180 tablet 3   rosuvastatin (CRESTOR) 20 MG tablet Take 1 tablet (20 mg total) by mouth 3 (three) times a week. Mon, Wed, and Fri 90 tablet 1   tadalafil (CIALIS) 20 MG tablet Take 1 tablet (20 mg total) by mouth  every other day as needed for erectile dysfunction. 90 tablet 1   tadalafil (CIALIS) 5 MG tablet Take 5 mg by mouth daily at 6 (six) AM.     triamcinolone (NASACORT) 55 MCG/ACT AERO nasal inhaler Place 2 sprays into the nose daily. 1 each 12   triamcinolone cream (KENALOG) 0.1 % Apply 1 application topically 2 (two) times daily. 100 g 0   amLODipine (NORVASC) 10 MG tablet Take 10 mg by mouth daily. (Patient not taking: Reported on 08/26/2022)     doxazosin (CARDURA) 2 MG tablet Take 1 tablet (2 mg total) by mouth daily. (Patient not taking: Reported on 10/07/2022) 90 tablet 3   No facility-administered medications prior to visit.    ROS: Review of Systems  Constitutional:  Positive for fatigue. Negative for appetite change and unexpected weight change.  HENT:  Negative for congestion, nosebleeds, sneezing, sore throat and trouble swallowing.   Eyes:  Negative for itching and visual disturbance.  Respiratory:  Negative for cough.   Cardiovascular:  Negative for chest pain, palpitations and leg swelling.  Gastrointestinal:  Negative for abdominal distention, blood in stool, diarrhea and nausea.  Genitourinary:  Positive for frequency and urgency. Negative for hematuria.  Musculoskeletal:  Positive for back pain. Negative for  gait problem, joint swelling and neck pain.  Skin:  Negative for rash.  Neurological:  Negative for dizziness, tremors, speech difficulty and weakness.  Psychiatric/Behavioral:  Negative for agitation, dysphoric mood and sleep disturbance. The patient is not nervous/anxious.     Objective:  BP 120/68 (BP Location: Right Arm, Patient Position: Sitting, Cuff Size: Large)   Pulse (!) 55   Temp 98.2 F (36.8 C) (Oral)   Ht 5\' 8"  (1.727 m)   Wt 167 lb (75.8 kg)   SpO2 98%   BMI 25.39 kg/m   BP Readings from Last 3 Encounters:  10/07/22 120/68  08/26/22 (!) 142/82  06/03/22 124/82    Wt Readings from Last 3 Encounters:  10/07/22 167 lb (75.8 kg)  08/26/22 170 lb  (77.1 kg)  06/03/22 171 lb (77.6 kg)    Physical Exam Constitutional:      General: He is not in acute distress.    Appearance: He is well-developed.     Comments: NAD  Eyes:     Conjunctiva/sclera: Conjunctivae normal.     Pupils: Pupils are equal, round, and reactive to light.  Neck:     Thyroid: No thyromegaly.     Vascular: No JVD.  Cardiovascular:     Rate and Rhythm: Normal rate and regular rhythm.     Heart sounds: Normal heart sounds. No murmur heard.    No friction rub. No gallop.  Pulmonary:     Effort: Pulmonary effort is normal. No respiratory distress.     Breath sounds: Normal breath sounds. No wheezing or rales.  Chest:     Chest wall: No tenderness.  Abdominal:     General: Bowel sounds are normal. There is no distension.     Palpations: Abdomen is soft. There is no mass.     Tenderness: There is no abdominal tenderness. There is no guarding or rebound.  Musculoskeletal:        General: No tenderness. Normal range of motion.     Cervical back: Normal range of motion.  Lymphadenopathy:     Cervical: No cervical adenopathy.  Skin:    General: Skin is warm and dry.     Findings: No rash.  Neurological:     Mental Status: He is alert and oriented to person, place, and time.     Cranial Nerves: No cranial nerve deficit.     Motor: No abnormal muscle tone.     Coordination: Coordination normal.     Gait: Gait normal.     Deep Tendon Reflexes: Reflexes are normal and symmetric.  Psychiatric:        Behavior: Behavior normal.        Thought Content: Thought content normal.        Judgment: Judgment normal.     Lab Results  Component Value Date   WBC 4.7 06/11/2022   HGB 11.8 (L) 06/11/2022   HCT 34.5 (L) 06/11/2022   PLT 205.0 06/11/2022   GLUCOSE 115 (H) 06/11/2022   CHOL 147 06/11/2022   TRIG 41.0 06/11/2022   HDL 75.00 06/11/2022   LDLDIRECT 149.6 12/15/2011   LDLCALC 64 06/11/2022   ALT 9 06/11/2022   AST 12 06/11/2022   NA 142 06/11/2022    K 4.8 06/11/2022   CL 104 06/11/2022   CREATININE 0.97 06/11/2022   BUN 23 06/11/2022   CO2 27 06/11/2022   TSH 1.61 06/11/2022   PSA 0.91 06/11/2022   INR 0.98 06/03/2010   HGBA1C 6.0 06/11/2022  MICROALBUR 1.3 10/15/2020    DG Knee Complete 4 Views Left  Result Date: 02/17/2018 CLINICAL DATA:  Acute LEFT knee pain, fell recently while walking the dog EXAM: LEFT KNEE - COMPLETE 4+ VIEW COMPARISON:  None FINDINGS: Osseous mineralization normal. Mild medial compartment joint space narrowing. Scattered chondrocalcinosis question CPPD. No acute fracture, dislocation, or bone destruction. Question minimal calcific debris at suprapatellar recess. No knee joint effusion. IMPRESSION: Minimal degenerative changes and suspected CPPD of the LEFT knee with question minimal calcific debris at the suprapatellar recess. No acute abnormalities. Electronically Signed   By: Ulyses Southward M.D.   On: 02/17/2018 16:35    Assessment & Plan:   Problem List Items Addressed This Visit     DM type 2 causing vascular disease (HCC)    Check A1c On Metformin Well-controlled      Essential hypertension   Other Visit Diagnoses     Onychomycosis    -  Primary   Relevant Medications   Efinaconazole (JUBLIA) 10 % SOLN   Other Relevant Orders   Ambulatory referral to Podiatry         Meds ordered this encounter  Medications   Efinaconazole (JUBLIA) 10 % SOLN    Sig: Apply 1 application  topically every morning.    Dispense:  4 mL    Refill:  2      Follow-up: Return in about 3 months (around 01/07/2023) for a follow-up visit.  Sonda Primes, MD

## 2022-10-08 ENCOUNTER — Ambulatory Visit (INDEPENDENT_AMBULATORY_CARE_PROVIDER_SITE_OTHER): Payer: Medicare Other | Admitting: Psychology

## 2022-10-08 DIAGNOSIS — F4323 Adjustment disorder with mixed anxiety and depressed mood: Secondary | ICD-10-CM

## 2022-10-08 DIAGNOSIS — F4310 Post-traumatic stress disorder, unspecified: Secondary | ICD-10-CM | POA: Diagnosis not present

## 2022-10-08 DIAGNOSIS — Z630 Problems in relationship with spouse or partner: Secondary | ICD-10-CM | POA: Diagnosis not present

## 2022-10-09 ENCOUNTER — Telehealth: Payer: Self-pay | Admitting: Internal Medicine

## 2022-10-09 NOTE — Progress Notes (Signed)
[facility] Counselor/Therapist Progress Note  Patient ID: Jared Alvarez, MRN: [id number],    Date:10/08/2022  Time Spent: 60 minutes  Treatment Type: Individual Therapy  Reported Symptoms: The patient attended a face-to-face individual therapy session in the office today.  The patient reports having feelings of insecurity and depression.  Mental Status Exam: Appearance:  Casual     Behavior: Appropriate  Motor: Normal  Speech/Language:  Normal Rate  Affect: Blunt  Mood: pleasant  Thought process: normal  Thought content:   WNL  Sensory/Perceptual disturbances:   WNL  Orientation: oriented to person, place, time/date, and situation  Attention: Good  Concentration: Good  Memory: WNL  Fund of knowledge:  Good  Insight:   Good  Judgment:  Good  Impulse Control: Good   Risk Assessment: Danger to Self:  No Self-injurious Behavior: No Danger to Others: No Duty to Warn:no Physical Aggression / Violence:No  Access to Firearms a concern: No  Gang Involvement:No   Subjective: The patient attended a face-to-face individual therapy session in the office today.  The patient presents as pleasant and cooperative.  The patient continues to struggle with what he wants to do about the relationship with his wife.  His wife is not open to getting therapy herself and thinks that she reacts a lot and projecting onto him problems he is experiencing.  We talked about him having to decide what it is that he wants to do.  I think he feels that his life would be much simpler if he just were by himself.  The patient also feels that he is wasting his time.  He talked a little about getting older and has concerned that he is missing out on things because he is dealing with family issues.  The patient has been doing better about not taking on her issues but she still struggles with making a decision and dealing with the conflict.  He is trying to find an easy solution. Interventions:  Cognitive Behavioral Therapy, Assertiveness/Communication, and Insight-Oriented, EMDR  Diagnosis:Marital conflict  PTSD (post-traumatic stress disorder)  Adjustment disorder with mixed anxiety and depressed mood  Plan: Client Abilities/Strengths  Insightful, motivated, supportive husband  Client Treatment Preferences  Outpatient Individual therapy/EMDR  Client Statement of Needs  "I am having nightmares and my therapist wondered if EMDR would be helpful"  Treatment Level  Outpatient Individual therapy  Symptoms  Demonstrates an exaggerated startle response.: No Description Entered (Status: improved). Depressed  or irritable mood.:  Displays a  significant decline in interest and engagement in activities.: (Status: maintained). Displays significant psychological and/or physiological distress resulting from internal and external  clues that are reminiscent of the traumatic event.:(Status: maintained).  Experiences disturbances in sleep.: (Status: maintained). Experiences disturbing  and persistent thoughts, images, and/or perceptions of the traumatic event.:  (Status:maintained). Experiences frequent nightmares.: (Status: improved).  Feelings of hopelessness, worthlessness, or inappropriate guilt.: (Status:  improved). Has been exposed to a traumatic event involving actual or perceived threat of death or  serious injury.:  (Status: maintained). Impairment in social, occupational, or  other areas of functioning.: . Intentionally avoids activities,  places, people, or objects (e.g., up-armored vehicles) that evoke memories of the event.: (Status: improved). Reports difficulty concentrating as  well as feelings of guilt.:  Problems Addressed  Goals 1. Develop healthy thinking patterns and beliefs about self, others, and the world that lead to the alleviation and help prevent the relapse of  depression. Objective Identify and replace thoughts and beliefs that support  depression.  Target Date:09/17/2023 Frequency: monthly Progress: 80 Modality: individual Related Interventions 1. Explore and restructure underlying assumptions and beliefs reflected in biased self-talk that  may put the client at risk for relapse or recurrence. 2. Conduct Cognitive-Behavioral Therapy (see Cognitive Behavior Therapy by Reola Calkins; Overcoming Depression by Agapito Games al.), beginning with helping the client learn the connection among  cognition, depressive feelings, and actions. 2. Eliminate or reduce the negative impact trauma related symptoms have  on social, occupational, and family functioning. Objective Learn and implement personal skills to manage challenging situations related to trauma. Target Date: 09/17/2023 Frequency: monthly Progress: 80 Modality: individual 3. No longer avoids persons, places, activities, and objects that are  reminiscent of the traumatic event. Objective Participate in Eye Movement Desensitization and Reprocessing (EMDR) to reduce emotional distress  related to traumatic thoughts, feelings, and images. Target Date: 09/17/2023 Frequency: monthly Progress: 80 Modality: individual   Related Interventions 1. Utilize Eye Movement Desensitization and Reprocessing (EMDR) to reduce the client's  emotional reactivity to the traumatic event and reduce PTSD symptoms. Objective Learn and implement guided self-dialogue to manage thoughts, feelings, and urges brought on by  encounters with trauma-related situations. Target Date: 09/17/2023 Frequency: monthly Progress: 80 Modality: individual Related Interventions 1. Teach the client a guided self-dialogue procedure in which he/she learns to recognize  maladaptive self-talk, challenges its biases, copes with engendered feelings, overcomes  avoidance, and reinforces his/her accomplishments; review and reinforce progress, problemsolve obstacles. 4. No longer experiences intrusive event recollections,  avoidance of event  reminders, intense arousal, or disinterest in activities or  relationships. 5. Thinks about or openly discusses the traumatic event with others  without experiencing psychological or physiological distress. Diagnosis:  Adjustment Disorder with mixed anxiety and depression Conditions For Discharge Achievement of treatment goals and objectives  Jemiah Cuadra G Beck Cofer, LCSW

## 2022-10-09 NOTE — Telephone Encounter (Signed)
Patient called and is unable to get the Jublia because of the price.  He would like something else sent in.  Please send it to  [facility] on [address].  Please call patient:  [phone]

## 2022-10-11 ENCOUNTER — Other Ambulatory Visit: Payer: Self-pay | Admitting: Internal Medicine

## 2022-10-11 IMAGING — DX DG WRIST COMPLETE 3+V*L*
4 series · 4 of 4 positions shown · non-contrast
Comparison: No prior.

CLINICAL DATA: Left wrist pain.  No known injury.

EXAM:
LEFT WRIST - COMPLETE 3+ VIEW

[wrist ap]
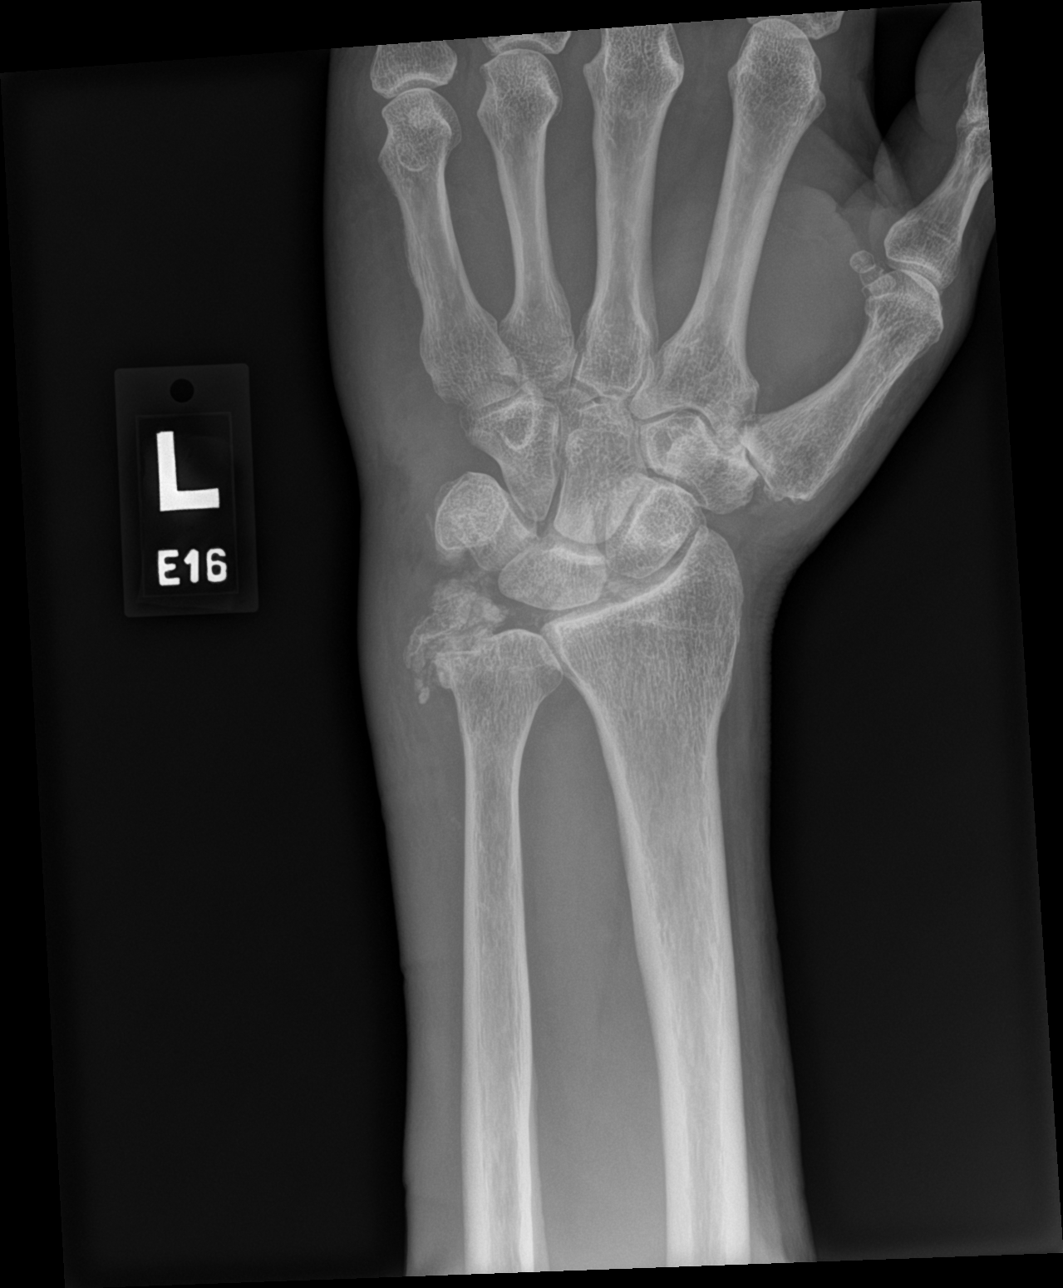

[wrist obl]
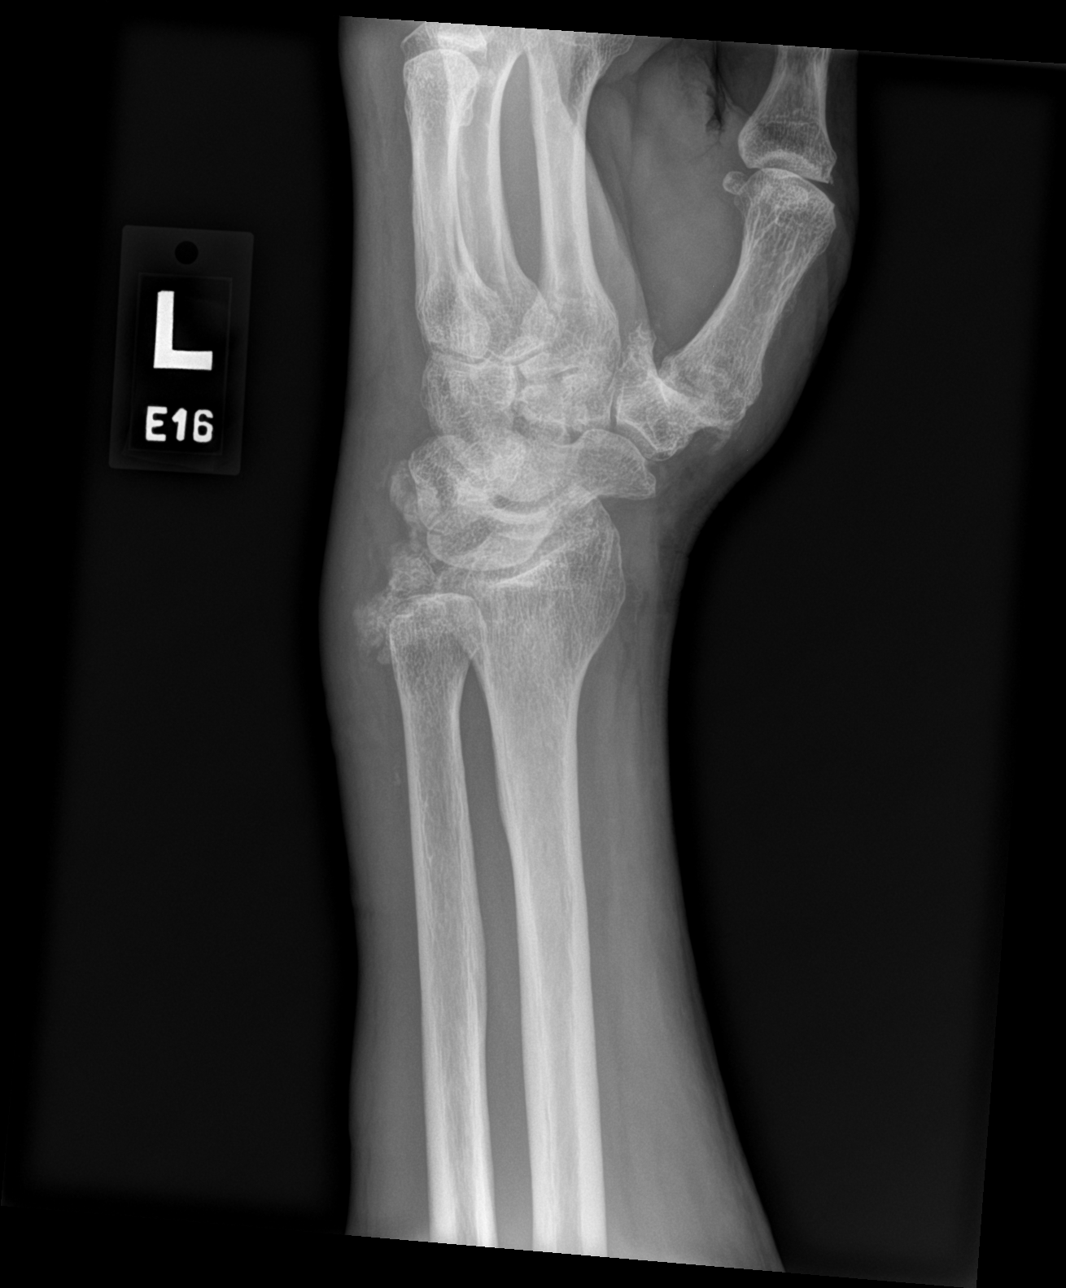

[wrist lat]
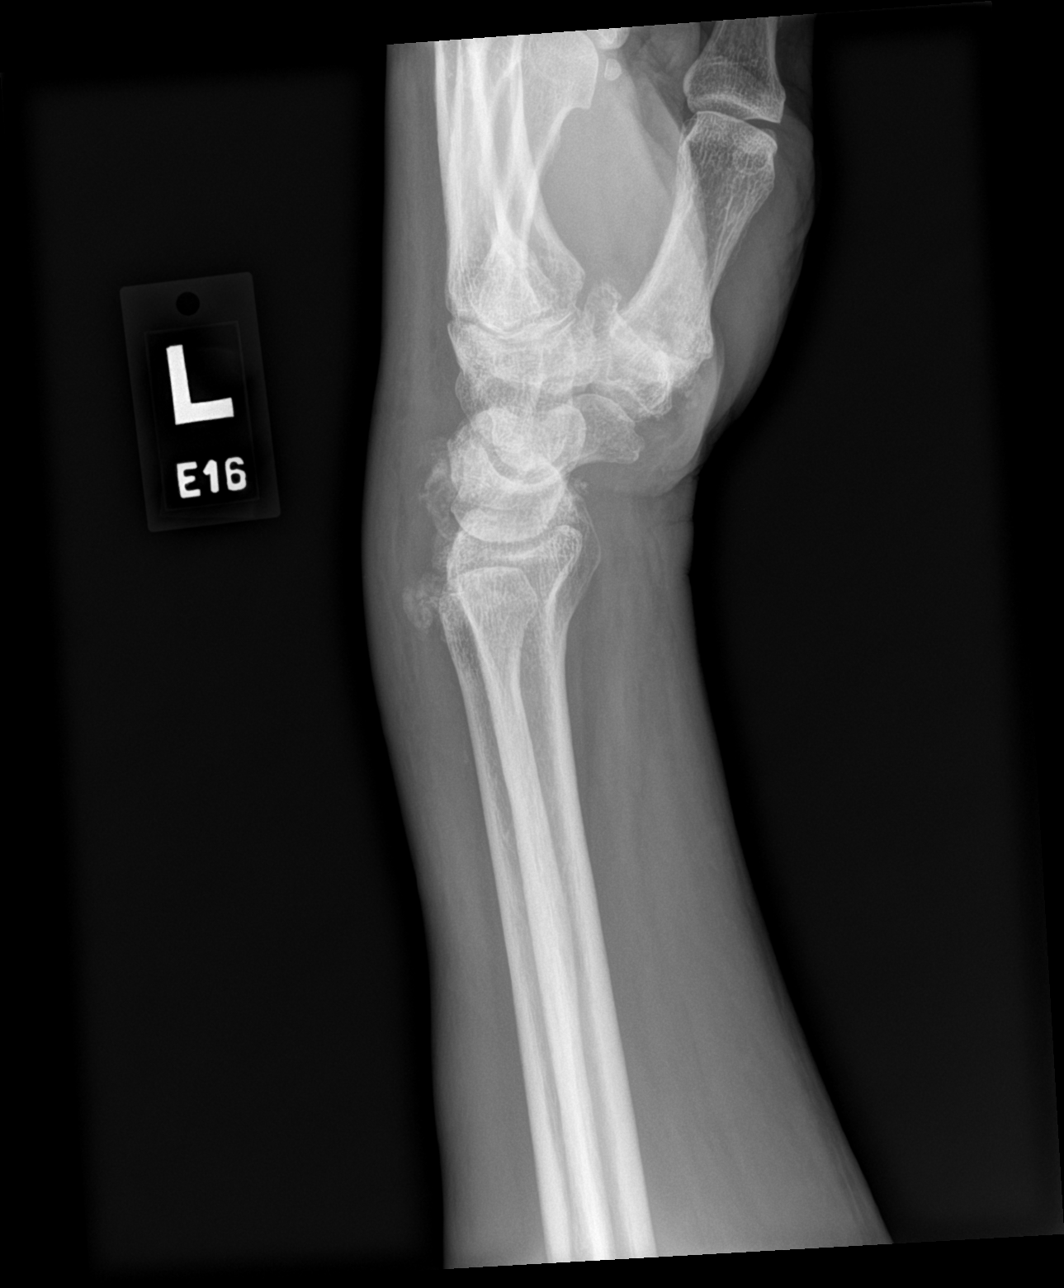

[scaphoid]
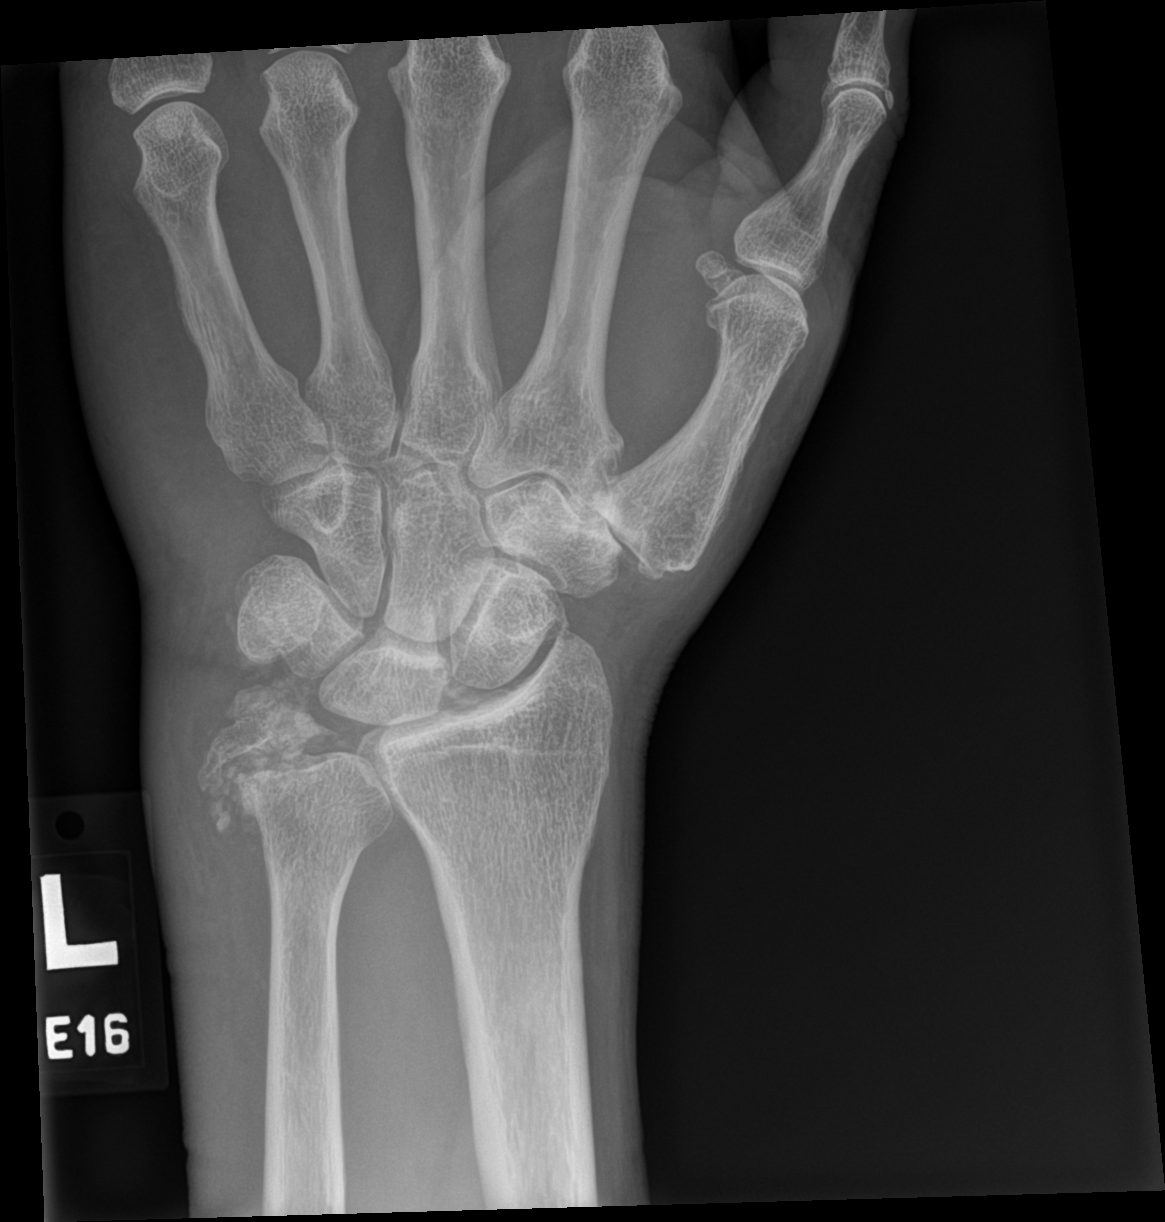

[4 of 4 positions shown; findings below may reference images not displayed]

FINDINGS: Diffuse soft tissue swelling. Diffuse prominent degenerative change.
Chondrocalcinosis. No erosive changes noted. No evidence of fracture
or dislocation. No radiopaque foreign body.
IMPRESSION: Diffuse soft tissue swelling. Diffuse prominent degenerative change.
Chondrocalcinosis noted. This could be secondary to degenerative
change. [HOSPITAL] induced arthropathy cannot be excluded. No erosive
changes noted. No evidence of fracture or dislocation.

## 2022-10-12 MED ORDER — CICLOPIROX 8 % EX SOLN: Freq: Every day | CUTANEOUS | 0 refills | Status: DC

## 2022-10-12 NOTE — Telephone Encounter (Signed)
Penlac Thx

## 2022-10-13 NOTE — Telephone Encounter (Signed)
Notified pt MD sent Penlac to [facility].../lmb

## 2022-10-14 ENCOUNTER — Other Ambulatory Visit: Payer: Self-pay | Admitting: Internal Medicine

## 2022-11-25 DIAGNOSIS — N5201 Erectile dysfunction due to arterial insufficiency: Secondary | ICD-10-CM | POA: Diagnosis not present

## 2022-11-25 DIAGNOSIS — R3912 Poor urinary stream: Secondary | ICD-10-CM | POA: Diagnosis not present

## 2022-11-25 DIAGNOSIS — N401 Enlarged prostate with lower urinary tract symptoms: Secondary | ICD-10-CM | POA: Diagnosis not present

## 2022-12-17 ENCOUNTER — Ambulatory Visit: Payer: Medicare Other | Admitting: Psychology

## 2022-12-21 ENCOUNTER — Other Ambulatory Visit: Payer: Self-pay | Admitting: Internal Medicine

## 2022-12-22 ENCOUNTER — Ambulatory Visit (INDEPENDENT_AMBULATORY_CARE_PROVIDER_SITE_OTHER): Payer: Medicare Other | Admitting: Psychology

## 2022-12-22 DIAGNOSIS — F4323 Adjustment disorder with mixed anxiety and depressed mood: Secondary | ICD-10-CM

## 2022-12-22 DIAGNOSIS — F4310 Post-traumatic stress disorder, unspecified: Secondary | ICD-10-CM | POA: Diagnosis not present

## 2022-12-22 DIAGNOSIS — Z630 Problems in relationship with spouse or partner: Secondary | ICD-10-CM | POA: Diagnosis not present

## 2022-12-23 NOTE — Progress Notes (Signed)
[facility] Counselor/Therapist Progress Note  Patient ID: Jared Alvarez, MRN: [id number],    Date:12/22/2022  Time Spent: 60 minutes  Time In: 2:00  Time out:3:00  Treatment Type: Individual Therapy  Reported Symptoms: The patient attended a face-to-face individual therapy session in the office today.  The patient reports having feelings of insecurity and depression.  Mental Status Exam: Appearance:  Casual     Behavior: Appropriate  Motor: Normal  Speech/Language:  Normal Rate  Affect: Blunt  Mood: pleasant  Thought process: normal  Thought content:   WNL  Sensory/Perceptual disturbances:   WNL  Orientation: oriented to person, place, time/date, and situation  Attention: Good  Concentration: Good  Memory: WNL  Fund of knowledge:  Good  Insight:   Good  Judgment:  Good  Impulse Control: Good   Risk Assessment: Danger to Self:  No Self-injurious Behavior: No Danger to Others: No Duty to Warn:no Physical Aggression / Violence:No  Access to Firearms a concern: No  Gang Involvement:No   Subjective: The patient attended a face-to-face individual therapy session in the office today.  The patient presents as pleasant and cooperative.  The patient reports that he and his wife went up to [place] for about a month.  He states that things went very well on their trip and they got back and it appears that his wife started having a moment where she was struggling with the marriage again.  We talked about what his options are and I explained to him that I felt like she was not doing her part as far as looking introspectively at herself to see how she impacts the relationship.  We talked about the options being 1 ) them talking about going to counseling together,2) them each going to their own separate counselor,3) things continuing to stay the same, and 4) deciding to end it.  The patient states that he is not sure if he needs to go to a logger or not and I encouraged  him to do so just because I am not sure what implications it would have for he and his wife because they are both retired at this point and he is the one with the money.  I explained that it does not hurt to have information and that he can always make a different decision.  The patient wanted to make more appointments and we did as he is concerned that this situation will deteriorate.  Interventions: Cognitive Behavioral Therapy, Assertiveness/Communication, and Insight-Oriented, EMDR  Diagnosis:Marital conflict  Adjustment disorder with mixed anxiety and depressed mood  PTSD (post-traumatic stress disorder)  Plan: Client Abilities/Strengths  Insightful, motivated, supportive husband  Client Treatment Preferences  Outpatient Individual therapy/EMDR  Client Statement of Needs  "I am having nightmares and my therapist wondered if EMDR would be helpful"  Treatment Level  Outpatient Individual therapy  Symptoms  Demonstrates an exaggerated startle response.: No Description Entered (Status: improved). Depressed  or irritable mood.:  Displays a  significant decline in interest and engagement in activities.: (Status: maintained). Displays significant psychological and/or physiological distress resulting from internal and external  clues that are reminiscent of the traumatic event.:(Status: maintained).  Experiences disturbances in sleep.: (Status: maintained). Experiences disturbing  and persistent thoughts, images, and/or perceptions of the traumatic event.:  (Status:maintained). Experiences frequent nightmares.: (Status: improved).  Feelings of hopelessness, worthlessness, or inappropriate guilt.: (Status:  improved). Has been exposed to a traumatic event involving actual or perceived threat of death or  serious injury.:  (Status: maintained).  Impairment in social, occupational, or  other areas of functioning.: . Intentionally avoids activities,  places, people, or objects (e.g.,  up-armored vehicles) that evoke memories of the event.: (Status: improved). Reports difficulty concentrating as  well as feelings of guilt.:  Problems Addressed  Goals 1. Develop healthy thinking patterns and beliefs about self, others, and the world that lead to the alleviation and help prevent the relapse of  depression. Objective Identify and replace thoughts and beliefs that support depression. Target Date:09/17/2023 Frequency: monthly Progress: 80 Modality: individual Related Interventions 1. Explore and restructure underlying assumptions and beliefs reflected in biased self-talk that  may put the client at risk for relapse or recurrence. 2. Conduct Cognitive-Behavioral Therapy (see Cognitive Behavior Therapy by Reola Calkins; Overcoming Depression by Agapito Games al.), beginning with helping the client learn the connection among  cognition, depressive feelings, and actions. 2. Eliminate or reduce the negative impact trauma related symptoms have  on social, occupational, and family functioning. Objective Learn and implement personal skills to manage challenging situations related to trauma. Target Date: 09/17/2023 Frequency: monthly Progress: 80 Modality: individual 3. No longer avoids persons, places, activities, and objects that are  reminiscent of the traumatic event. Objective Participate in Eye Movement Desensitization and Reprocessing (EMDR) to reduce emotional distress  related to traumatic thoughts, feelings, and images. Target Date: 09/17/2023 Frequency: monthly Progress: 80 Modality: individual   Related Interventions 1. Utilize Eye Movement Desensitization and Reprocessing (EMDR) to reduce the client's  emotional reactivity to the traumatic event and reduce PTSD symptoms. Objective Learn and implement guided self-dialogue to manage thoughts, feelings, and urges brought on by  encounters with trauma-related situations. Target Date: 09/17/2023 Frequency: monthly Progress: 80  Modality: individual Related Interventions 1. Teach the client a guided self-dialogue procedure in which he/she learns to recognize  maladaptive self-talk, challenges its biases, copes with engendered feelings, overcomes  avoidance, and reinforces his/her accomplishments; review and reinforce progress, problemsolve obstacles. 4. No longer experiences intrusive event recollections, avoidance of event  reminders, intense arousal, or disinterest in activities or  relationships. 5. Thinks about or openly discusses the traumatic event with others  without experiencing psychological or physiological distress. Diagnosis:  Adjustment Disorder with mixed anxiety and depression Conditions For Discharge Achievement of treatment goals and objectives  Deem Marmol G Jaslen Adcox, LCSW

## 2022-12-29 ENCOUNTER — Ambulatory Visit (INDEPENDENT_AMBULATORY_CARE_PROVIDER_SITE_OTHER): Payer: Medicare Other | Admitting: Psychology

## 2022-12-29 DIAGNOSIS — Z630 Problems in relationship with spouse or partner: Secondary | ICD-10-CM | POA: Diagnosis not present

## 2022-12-29 DIAGNOSIS — F4310 Post-traumatic stress disorder, unspecified: Secondary | ICD-10-CM | POA: Diagnosis not present

## 2022-12-29 DIAGNOSIS — F4323 Adjustment disorder with mixed anxiety and depressed mood: Secondary | ICD-10-CM | POA: Diagnosis not present

## 2022-12-30 NOTE — Progress Notes (Signed)
[facility] Counselor/Therapist Progress Note  Patient ID: MASCUD WHICHARD, MRN: [id number],    Date:12/29/2022  Time Spent: 60 minutes  Time In: 2:00  Time out:3:00  Treatment Type: Individual Therapy  Reported Symptoms: The patient attended a face-to-face individual therapy session in the office today.  The patient reports having feelings of insecurity and depression.  Mental Status Exam: Appearance:  Casual     Behavior: Appropriate  Motor: Normal  Speech/Language:  Normal Rate  Affect: Blunt  Mood: pleasant  Thought process: normal  Thought content:   WNL  Sensory/Perceptual disturbances:   WNL  Orientation: oriented to person, place, time/date, and situation  Attention: Good  Concentration: Good  Memory: WNL  Fund of knowledge:  Good  Insight:   Good  Judgment:  Good  Impulse Control: Good   Risk Assessment: Danger to Self:  No Self-injurious Behavior: No Danger to Others: No Duty to Warn:no Physical Aggression / Violence:No  Access to Firearms a concern: No  Gang Involvement:No   Subjective: The patient attended a face-to-face individual therapy session in the office today.  The patient presents as pleasant and cooperative.  The patient reports that he and his wife had some conversations about their marriage and she even said that she might go to therapy with him to work things out.  He stated that she wanted to be happy with him more than she wanted to be happy without him.  He seems cautiously optimistic about this and I explained that some of it would be dependent on how they move forward.  I encouraged him to go ahead and think about making an appointment with a therapist so that that could go ahead and start.  The patient also talked about some self-discovery and being more aware of his role in relationships.  We talked about him being a nice person and that he cannot go back and change anything but that all he can do is learn from it and move  forward.  He is doing a lot of reflecting on his life since he is aging.  This is normal. Interventions: Cognitive Behavioral Therapy, Assertiveness/Communication, and Insight-Oriented, EMDR  Diagnosis:Marital conflict  Adjustment disorder with mixed anxiety and depressed mood  PTSD (post-traumatic stress disorder)  Plan: Client Abilities/Strengths  Insightful, motivated, supportive husband  Client Treatment Preferences  Outpatient Individual therapy/EMDR  Client Statement of Needs  "I am having nightmares and my therapist wondered if EMDR would be helpful"  Treatment Level  Outpatient Individual therapy  Symptoms  Demonstrates an exaggerated startle response.: No Description Entered (Status: improved). Depressed  or irritable mood.:  Displays a  significant decline in interest and engagement in activities.: (Status: maintained). Displays significant psychological and/or physiological distress resulting from internal and external  clues that are reminiscent of the traumatic event.:(Status: maintained).  Experiences disturbances in sleep.: (Status: maintained). Experiences disturbing  and persistent thoughts, images, and/or perceptions of the traumatic event.:  (Status:maintained). Experiences frequent nightmares.: (Status: improved).  Feelings of hopelessness, worthlessness, or inappropriate guilt.: (Status:  improved). Has been exposed to a traumatic event involving actual or perceived threat of death or  serious injury.:  (Status: maintained). Impairment in social, occupational, or  other areas of functioning.: . Intentionally avoids activities,  places, people, or objects (e.g., up-armored vehicles) that evoke memories of the event.: (Status: improved). Reports difficulty concentrating as  well as feelings of guilt.:  Problems Addressed  Goals 1. Develop healthy thinking patterns and beliefs about self, others, and the world that  lead to the alleviation and help prevent the  relapse of  depression. Objective Identify and replace thoughts and beliefs that support depression. Target Date:09/17/2023 Frequency: monthly Progress: 80 Modality: individual Related Interventions 1. Explore and restructure underlying assumptions and beliefs reflected in biased self-talk that  may put the client at risk for relapse or recurrence. 2. Conduct Cognitive-Behavioral Therapy (see Cognitive Behavior Therapy by Reola Calkins; Overcoming Depression by Agapito Games al.), beginning with helping the client learn the connection among  cognition, depressive feelings, and actions. 2. Eliminate or reduce the negative impact trauma related symptoms have  on social, occupational, and family functioning. Objective Learn and implement personal skills to manage challenging situations related to trauma. Target Date: 09/17/2023 Frequency: monthly Progress: 80 Modality: individual 3. No longer avoids persons, places, activities, and objects that are  reminiscent of the traumatic event. Objective Participate in Eye Movement Desensitization and Reprocessing (EMDR) to reduce emotional distress  related to traumatic thoughts, feelings, and images. Target Date: 09/17/2023 Frequency: monthly Progress: 80 Modality: individual   Related Interventions 1. Utilize Eye Movement Desensitization and Reprocessing (EMDR) to reduce the client's  emotional reactivity to the traumatic event and reduce PTSD symptoms. Objective Learn and implement guided self-dialogue to manage thoughts, feelings, and urges brought on by  encounters with trauma-related situations. Target Date: 09/17/2023 Frequency: monthly Progress: 80 Modality: individual Related Interventions 1. Teach the client a guided self-dialogue procedure in which he/she learns to recognize  maladaptive self-talk, challenges its biases, copes with engendered feelings, overcomes  avoidance, and reinforces his/her accomplishments; review and reinforce  progress, problemsolve obstacles. 4. No longer experiences intrusive event recollections, avoidance of event  reminders, intense arousal, or disinterest in activities or  relationships. 5. Thinks about or openly discusses the traumatic event with others  without experiencing psychological or physiological distress. Diagnosis:  Adjustment Disorder with mixed anxiety and depression Conditions For Discharge Achievement of treatment goals and objectives  Anelly Samarin G Paxton Kanaan, LCSW

## 2023-01-06 ENCOUNTER — Ambulatory Visit (INDEPENDENT_AMBULATORY_CARE_PROVIDER_SITE_OTHER): Payer: Medicare Other | Admitting: Psychology

## 2023-01-06 DIAGNOSIS — F4323 Adjustment disorder with mixed anxiety and depressed mood: Secondary | ICD-10-CM

## 2023-01-06 DIAGNOSIS — F4310 Post-traumatic stress disorder, unspecified: Secondary | ICD-10-CM

## 2023-01-06 DIAGNOSIS — Z630 Problems in relationship with spouse or partner: Secondary | ICD-10-CM

## 2023-01-06 NOTE — Progress Notes (Signed)
[facility] Counselor/Therapist Progress Note  Patient ID: DAMANI Alvarez, MRN: [id number],    Date:01/06/2023  Time Spent: 60 minutes  Time In: 12:00  Time out:1:00  Treatment Type: Individual Therapy  Reported Symptoms: The patient attended a face-to-face individual therapy session in the office today.  The patient reports having feelings of insecurity and depression.  Mental Status Exam: Appearance:  Casual     Behavior: Appropriate  Motor: Normal  Speech/Language:  Normal Rate  Affect: Blunt  Mood: depressed  Thought process: normal  Thought content:   WNL  Sensory/Perceptual disturbances:   WNL  Orientation: oriented to person, place, time/date, and situation  Attention: Good  Concentration: Good  Memory: WNL  Fund of knowledge:  Good  Insight:   Good  Judgment:  Good  Impulse Control: Good   Risk Assessment: Danger to Self:  No Self-injurious Behavior: No Danger to Others: No Duty to Warn:no Physical Aggression / Violence:No  Access to Firearms a concern: No  Gang Involvement:No   Subjective: The patient attended a face-to-face individual therapy session in the office today.  The patient presents as depressed.  The patient came in and said that he feels like he has not made any progress and that he is totally lost.  He said that he continues to be unhappy with the way he is and he is reevaluating his life at 20 and he does not feel like he has any direction.  We processed things a bit more and it seems that part of this is his frustration with himself because he cannot make a decision about his marriage.  He realizes that he and his wife are not a good fit together yet he does not want to make the hard decisions.  We talked about him being a person who is sensitive and that he is not leaning into the characteristics that are he is.  I told him that I thought some of this sadness and depression is that he is taking on the situation himself and he is  taking all the responsibility for things not going well.  I told him that I thought he had recreated the situation with his wife from his childhood with his parents and that his angst was likely being caused by the fact that he does not want to make a decision that is difficult to make.  I encouraged him to come back next time and we will continue to process and talk about things moving forward.  Interventions: Cognitive Behavioral Therapy, Assertiveness/Communication, and Insight-Oriented, EMDR  Diagnosis:Marital conflict  Adjustment disorder with mixed anxiety and depressed mood  PTSD (post-traumatic stress disorder)  Plan: Client Abilities/Strengths  Insightful, motivated, supportive husband  Client Treatment Preferences  Outpatient Individual therapy/EMDR  Client Statement of Needs  "I am having nightmares and my therapist wondered if EMDR would be helpful"  Treatment Level  Outpatient Individual therapy  Symptoms  Demonstrates an exaggerated startle response.: No Description Entered (Status: improved). Depressed  or irritable mood.:  Displays a  significant decline in interest and engagement in activities.: (Status: maintained). Displays significant psychological and/or physiological distress resulting from internal and external  clues that are reminiscent of the traumatic event.:(Status: maintained).  Experiences disturbances in sleep.: (Status: maintained). Experiences disturbing  and persistent thoughts, images, and/or perceptions of the traumatic event.:  (Status:maintained). Experiences frequent nightmares.: (Status: improved).  Feelings of hopelessness, worthlessness, or inappropriate guilt.: (Status:  improved). Has been exposed to a traumatic event involving actual or perceived  threat of death or  serious injury.:  (Status: maintained). Impairment in social, occupational, or  other areas of functioning.: . Intentionally avoids activities,  places, people, or objects (e.g.,  up-armored vehicles) that evoke memories of the event.: (Status: improved). Reports difficulty concentrating as  well as feelings of guilt.:  Problems Addressed  Goals 1. Develop healthy thinking patterns and beliefs about self, others, and the world that lead to the alleviation and help prevent the relapse of  depression. Objective Identify and replace thoughts and beliefs that support depression. Target Date:09/17/2023 Frequency: monthly Progress: 80 Modality: individual Related Interventions 1. Explore and restructure underlying assumptions and beliefs reflected in biased self-talk that  may put the client at risk for relapse or recurrence. 2. Conduct Cognitive-Behavioral Therapy (see Cognitive Behavior Therapy by Reola Calkins; Overcoming Depression by Agapito Games al.), beginning with helping the client learn the connection among  cognition, depressive feelings, and actions. 2. Eliminate or reduce the negative impact trauma related symptoms have  on social, occupational, and family functioning. Objective Learn and implement personal skills to manage challenging situations related to trauma. Target Date: 09/17/2023 Frequency: monthly Progress: 80 Modality: individual 3. No longer avoids persons, places, activities, and objects that are  reminiscent of the traumatic event. Objective Participate in Eye Movement Desensitization and Reprocessing (EMDR) to reduce emotional distress  related to traumatic thoughts, feelings, and images. Target Date: 09/17/2023 Frequency: monthly Progress: 80 Modality: individual   Related Interventions 1. Utilize Eye Movement Desensitization and Reprocessing (EMDR) to reduce the client's  emotional reactivity to the traumatic event and reduce PTSD symptoms. Objective Learn and implement guided self-dialogue to manage thoughts, feelings, and urges brought on by  encounters with trauma-related situations. Target Date: 09/17/2023 Frequency: monthly Progress: 80  Modality: individual Related Interventions 1. Teach the client a guided self-dialogue procedure in which he/she learns to recognize  maladaptive self-talk, challenges its biases, copes with engendered feelings, overcomes  avoidance, and reinforces his/her accomplishments; review and reinforce progress, problemsolve obstacles. 4. No longer experiences intrusive event recollections, avoidance of event  reminders, intense arousal, or disinterest in activities or  relationships. 5. Thinks about or openly discusses the traumatic event with others  without experiencing psychological or physiological distress. Diagnosis:  Adjustment Disorder with mixed anxiety and depression Conditions For Discharge Achievement of treatment goals and objectives  Juanantonio Stolar G Telma Pyeatt, LCSW

## 2023-01-09 ENCOUNTER — Other Ambulatory Visit: Payer: Self-pay

## 2023-01-09 ENCOUNTER — Emergency Department (HOSPITAL_COMMUNITY)
Admission: EM | Admit: 2023-01-09 | Discharge: 2023-01-09 | Disposition: A | Discharge status: Home / Self Care | Payer: Medicare Other | Attending: Emergency Medicine | Admitting: Emergency Medicine

## 2023-01-09 ENCOUNTER — Encounter (HOSPITAL_COMMUNITY): Payer: Self-pay

## 2023-01-09 ENCOUNTER — Emergency Department (HOSPITAL_COMMUNITY): Payer: Medicare Other

## 2023-01-09 DIAGNOSIS — I771 Stricture of artery: Secondary | ICD-10-CM | POA: Diagnosis not present

## 2023-01-09 DIAGNOSIS — Z7984 Long term (current) use of oral hypoglycemic drugs: Secondary | ICD-10-CM | POA: Insufficient documentation

## 2023-01-09 DIAGNOSIS — Z7982 Long term (current) use of aspirin: Secondary | ICD-10-CM | POA: Diagnosis not present

## 2023-01-09 DIAGNOSIS — R079 Chest pain, unspecified: Secondary | ICD-10-CM | POA: Diagnosis not present

## 2023-01-09 DIAGNOSIS — I2510 Atherosclerotic heart disease of native coronary artery without angina pectoris: Secondary | ICD-10-CM | POA: Insufficient documentation

## 2023-01-09 DIAGNOSIS — W19XXXA Unspecified fall, initial encounter: Secondary | ICD-10-CM

## 2023-01-09 DIAGNOSIS — E119 Type 2 diabetes mellitus without complications: Secondary | ICD-10-CM | POA: Diagnosis not present

## 2023-01-09 DIAGNOSIS — Z79899 Other long term (current) drug therapy: Secondary | ICD-10-CM | POA: Insufficient documentation

## 2023-01-09 DIAGNOSIS — I10 Essential (primary) hypertension: Secondary | ICD-10-CM | POA: Diagnosis not present

## 2023-01-09 DIAGNOSIS — R0781 Pleurodynia: Secondary | ICD-10-CM | POA: Diagnosis not present

## 2023-01-09 DIAGNOSIS — W010XXA Fall on same level from slipping, tripping and stumbling without subsequent striking against object, initial encounter: Secondary | ICD-10-CM | POA: Diagnosis not present

## 2023-01-09 MED ORDER — LIDOCAINE 5 % EX PTCH
1.0000 | MEDICATED_PATCH | CUTANEOUS | Status: DC
  Administered 2023-01-09: 1 via TRANSDERMAL
  Filled 2023-01-09: qty 1

## 2023-01-09 NOTE — ED Provider Notes (Signed)
[place] EMERGENCY DEPARTMENT AT [facility] Provider Note   CSN: [id number] Arrival date & time: 01/09/23  1225     History  Chief Complaint  Patient presents with   Jared Alvarez is a 72 y.o. male with a past medical history significant for hypertension, CAD, type 2 diabetes, aortic aneurysm, and hyperlipidemia who presents to the ED after a mechanical fall.  He notes he tripped over roots of a tree landing on his anterior chest wall.  Admits to left-sided chest wall/rib pain.  Admits to hitting the anterior aspect of his head.  No LOC.  On ASA 81 mg however, no other blood thinners.  Denies nausea and vomiting.  No speech or visual changes.  Denies unilateral weakness.  Patient states left-sided chest wall pain and rib pain worse with movement and deep inspiration.  No other injuries.  History obtained from patient and past medical records. No interpreter used during encounter.       Home Medications Prior to Admission medications   Medication Sig Start Date End Date Taking? Authorizing Provider  acetaminophen (TYLENOL) 500 MG tablet Take 1 tablet (500 mg total) by mouth every 6 (six) hours as needed. 07/25/21   Plotnikov, Georgina Quint, MD  alfuzosin (UROXATRAL) 10 MG 24 hr tablet Take 10 mg by mouth daily. 10/01/22   [provider]  aspirin 81 MG tablet Take 1 tablet (81 mg total) by mouth daily. 06/23/17   Plotnikov, Georgina Quint, MD  ciclopirox (PENLAC) 8 % solution Apply topically at bedtime. Apply over nail and surrounding skin. Apply daily over previous coat. After seven (7) days, may remove with alcohol and continue cycle. 10/12/22   Plotnikov, Georgina Quint, MD  colchicine 0.6 MG tablet Take 1 tablet (0.6 mg total) by mouth 2 (two) times daily. If patient has flare up with gout 10/28/20   Rodolph Bong, MD  Cyanocobalamin (VITAMIN B-12) 500 MCG SUBL Place 1 tablet (500 mcg total) under the tongue daily. 06/23/17   Plotnikov, Georgina Quint, MD  Efinaconazole  (JUBLIA) 10 % SOLN Apply 1 application  topically every morning. 10/07/22   Plotnikov, Georgina Quint, MD  FLUoxetine (PROZAC) 40 MG capsule TAKE 1 CAPSULE (40 MG TOTAL) BY MOUTH DAILY. 12/22/22   Plotnikov, Georgina Quint, MD  Homeopathic Products (ARNICARE ARNICA) OINT Apply 1 application topically 2 (two) times daily as needed (arthritis).    [provider]  ibuprofen (ADVIL) 200 MG tablet Ibuprofen cream PRN    [provider]  IBUPROFEN-LINIMENT CO by Combination route.    [provider]  LORazepam (ATIVAN) 1 MG tablet Take 0.5-1 tablets (0.5-1 mg total) by mouth 2 (two) times daily as needed for anxiety. 06/03/22   Plotnikov, Georgina Quint, MD  losartan (COZAAR) 100 MG tablet TAKE 1 TABLET BY MOUTH EVERY DAY 04/26/22   Plotnikov, Georgina Quint, MD  metFORMIN (GLUCOPHAGE) 1000 MG tablet TAKE ONE TABLET BY MOUTH TWICE A DAY WITH A MEAL 07/23/22   Plotnikov, Georgina Quint, MD  nitroGLYCERIN (NITROSTAT) 0.4 MG SL tablet Place 1 tablet (0.4 mg total) under the tongue every 5 (five) minutes as needed for chest pain. 06/23/17   Plotnikov, Georgina Quint, MD  pantoprazole (PROTONIX) 40 MG tablet TAKE 1 TABLET BY MOUTH TWICE A DAY 10/14/22   Plotnikov, Georgina Quint, MD  rosuvastatin (CRESTOR) 20 MG tablet TAKE 1 TABLET (20 MG TOTAL) BY MOUTH 3 (THREE) TIMES A WEEK. MON, WED, AND FRI 10/12/22   Plotnikov, Georgina Quint,  MD  tadalafil (CIALIS) 20 MG tablet Take 1 tablet (20 mg total) by mouth every other day as needed for erectile dysfunction. 06/03/22   Plotnikov, Georgina Quint, MD  tadalafil (CIALIS) 5 MG tablet Take 5 mg by mouth daily at 6 (six) AM.    [provider]  triamcinolone (NASACORT) 55 MCG/ACT AERO nasal inhaler Place 2 sprays into the nose daily. 01/30/21   Corwin Levins, MD  triamcinolone cream (KENALOG) 0.1 % Apply 1 application topically 2 (two) times daily. 04/08/21   Myrlene Broker, MD      Allergies    Atorvastatin, Bupropion hcl, Doxazosin, Duloxetine, Fluticasone, Nexium  [esomeprazole magnesium], Simvastatin, Venlafaxine, Viibryd [vilazodone hcl], Vilazodone, Ketorolac, and Tamsulosin    Review of Systems   Review of Systems  Cardiovascular:  Positive for chest pain (chest wall pain).  Neurological:  Negative for weakness, numbness and headaches.    Physical Exam Updated Vital Signs BP 136/74 (BP Location: Right Arm)   Pulse (!) 58   Temp 97.8 F (36.6 C) (Oral)   Resp 18   Ht 5\' 7"  (1.702 m)   Wt 76.2 kg   SpO2 98%   BMI 26.31 kg/m  Physical Exam Vitals and nursing note reviewed.  Constitutional:      General: He is not in acute distress.    Appearance: He is not ill-appearing.  HENT:     Head: Normocephalic.  Eyes:     Pupils: Pupils are equal, round, and reactive to light.  Cardiovascular:     Rate and Rhythm: Normal rate and regular rhythm.     Pulses: Normal pulses.     Heart sounds: Normal heart sounds. No murmur heard.    No friction rub. No gallop.  Pulmonary:     Effort: Pulmonary effort is normal.     Breath sounds: Normal breath sounds.  Chest:     Comments: No anterior chest wall tendering. No tenderness throughout left ribs. No crepitus.  Abdominal:     General: Abdomen is flat. There is no distension.     Palpations: Abdomen is soft.     Tenderness: There is no abdominal tenderness. There is no guarding or rebound.  Musculoskeletal:        General: Normal range of motion.     Cervical back: Neck supple.  Skin:    General: Skin is warm and dry.  Neurological:     General: No focal deficit present.     Mental Status: He is alert.     Comments: Speech is clear, able to follow commands CN III-XII intact Normal strength in upper and lower extremities bilaterally including dorsiflexion and plantar flexion, strong and equal grip strength Sensation grossly intact throughout Moves extremities without ataxia, coordination intact No pronator drift  Psychiatric:        Mood and Affect: Mood normal.        Behavior:  Behavior normal.     ED Results / Procedures / Treatments   Labs (all labs ordered are listed, but only abnormal results are displayed) Labs Reviewed - No data to display  EKG EKG Interpretation Date/Time:  Saturday January 09 2023 12:38:03 EDT Ventricular Rate:  56 PR Interval:  184 QRS Duration:  84 QT Interval:  408 QTC Calculation: 393 R Axis:   49  Text Interpretation: Sinus bradycardia Anteroseptal infarct , age undetermined Abnormal ECG When compared with ECG of 16-Dec-2017 10:51, PREVIOUS ECG IS PRESENT Confirmed by Kristine Royal [phone]) on 01/09/2023 2:35:04 PM  Radiology DG Chest 2 View  Result Date: 01/09/2023 CLINICAL DATA:  Fall this morning.  Chest pain. EXAM: CHEST - 2 VIEW COMPARISON:  None Available. FINDINGS: The heart size and mediastinal contours are within normal limits. Stable tortuosity of thoracic aorta. No evidence of pneumothorax or hemothorax. Both lungs are clear. The visualized skeletal structures are unremarkable. IMPRESSION: No active cardiopulmonary disease. Electronically Signed   By: Danae Orleans M.D.   On: 01/09/2023 14:37    Procedures Procedures    Medications Ordered in ED Medications  lidocaine (LIDODERM) 5 % 1 patch (1 patch Transdermal Patch Applied 01/09/23 1443)    ED Course/ Medical Decision Making/ A&P                                 Medical Decision Making Amount and/or Complexity of Data Reviewed Radiology: ordered and independent interpretation performed. Decision-making details documented in ED Course. ECG/medicine tests: ordered and independent interpretation performed. Decision-making details documented in ED Course.  Risk Prescription drug management.   72 year old male presents to the ED after a mechanical fall.  Tripped on tree roots landing on his anterior chest wall.  Admits to hitting his head.  No LOC.  On ASA 81 mg however, no other blood thinners.  Admits to left-sided chest wall and rib pain.  Upon arrival  patient afebrile, not tachycardic or hypoxic.  Patient in no acute distress.  No tenderness to anterior chest wall or crepitus.  Normal neurological exam without any neurological deficits. Lungs clear to auscultation bilaterally. Chest x-ray and EKG ordered in triage.  Offered CT head and cervical spine given head injury however, patient declined which I find to be reasonable given my suspicion for any intracranial bleed or other emergent injuries is low.    CXR personally reviewed and interpreted which is negative for any bony fractures.  No evidence of pneumothorax.  EKG demonstrates sinus bradycardia.  Low suspicion for ACS. Suspect muscular related pain. Advised patient to take OTC ibuprofen or tylenol as needed for pain. Follow-up with PCP within next few days for recheck or return for worsening symptoms. Strict ED precautions discussed with patient. Patient states understanding and agrees to plan. Patient discharged home in no acute distress and stable vitals  Lives at home Has PCP Hx HTN, CAD  Discussed with Dr. Rodena Medin who agrees with assessment and plan.        Final Clinical Impression(s) / ED Diagnoses Final diagnoses:  Fall, initial encounter  Rib pain on left side    Rx / DC Orders ED Discharge Orders     None         Jesusita Oka 01/09/23 1445    Wynetta Fines, MD 01/09/23 1616

## 2023-01-09 NOTE — Discharge Instructions (Signed)
It was a pleasure taking care of you today.  As discussed, your x-ray did not show any rib fractures.  No evidence of lung injury.  Continue to take ibuprofen or Tylenol as needed for pain.  You may also purchase over-the-counter Lidoderm patches or Voltaren gel for added pain relief.  Please follow-up with PCP early next week for recheck.  Return to the ER for any worsening symptoms.

## 2023-01-09 NOTE — ED Triage Notes (Addendum)
Pt arrived POV from hiking where he tripped over a trip branch and fell forward and smacked his chest on a tree root. Pt is having some pain that is tender to touch near his left shoulder and upper chest area. Pt did take 400mg  of ibuprofen before arriving.

## 2023-01-09 NOTE — ED Provider Triage Note (Signed)
Emergency Medicine Provider Triage Evaluation Note  Jared Alvarez , a 72 y.o. male  was evaluated in triage.  Pt here for evaluation after fall.  Patient tripped over a branch, fell forward and smacked his chest on a tree root.  Denies any chest pain before the fall.  Denies any shortness of breath, nausea, vomiting, bruises, laceration.  Review of Systems  Positive: As above Negative: As above  Physical Exam  BP 136/74 (BP Location: Right Arm)   Pulse (!) 58   Temp 97.8 F (36.6 C) (Oral)   Resp 18   Ht 5\' 7"  (1.702 m)   Wt 76.2 kg   SpO2 98%   BMI 26.31 kg/m  Gen:   Awake, no distress   Resp:  Normal effort  MSK:   Moves extremities without difficulty  Other:  Tenderness to palpation to left-sided chest.  Medical Decision Making  Medically screening exam initiated at 1:01 PM.  Appropriate orders placed.  Jared Alvarez was informed that the remainder of the evaluation will be completed by another provider, this initial triage assessment does not replace that evaluation, and the importance of remaining in the ED until their evaluation is complete.    Jeanelle Malling, [place] 01/09/23 1304

## 2023-01-13 ENCOUNTER — Ambulatory Visit: Payer: Medicare Other | Admitting: Psychology

## 2023-01-19 ENCOUNTER — Ambulatory Visit (INDEPENDENT_AMBULATORY_CARE_PROVIDER_SITE_OTHER): Payer: Medicare Other | Admitting: Psychology

## 2023-01-19 DIAGNOSIS — F4323 Adjustment disorder with mixed anxiety and depressed mood: Secondary | ICD-10-CM | POA: Diagnosis not present

## 2023-01-19 DIAGNOSIS — Z630 Problems in relationship with spouse or partner: Secondary | ICD-10-CM | POA: Diagnosis not present

## 2023-01-19 DIAGNOSIS — F4310 Post-traumatic stress disorder, unspecified: Secondary | ICD-10-CM

## 2023-01-20 NOTE — Progress Notes (Signed)
[facility] Counselor/Therapist Progress Note  Patient ID: SHRISH STATE, MRN: [id number],    Date:01/19/2023  Time Spent: 60 minutes  Time In: 2:00  Time out:3:00  Treatment Type: Individual Therapy  Reported Symptoms: The patient attended a face-to-face individual therapy session in the office today.  The patient reports having feelings of insecurity and depression.  Mental Status Exam: Appearance:  Casual     Behavior: Appropriate  Motor: Normal  Speech/Language:  Normal Rate  Affect: Blunt  Mood: depressed  Thought process: normal  Thought content:   WNL  Sensory/Perceptual disturbances:   WNL  Orientation: oriented to person, place, time/date, and situation  Attention: Good  Concentration: Good  Memory: WNL  Fund of knowledge:  Good  Insight:   Good  Judgment:  Good  Impulse Control: Good   Risk Assessment: Danger to Self:  No Self-injurious Behavior: No Danger to Others: No Duty to Warn:no Physical Aggression / Violence:No  Access to Firearms a concern: No  Gang Involvement:No   Subjective: The patient attended a face-to-face individual therapy session in the office today.  The patient continues to lament over what to do about the situation that he is in.  We talked again about his unhappiness and being in a relationship with his wife.  He feels that he does not want to upset her and he just continues to stay in the relationship as it is.  We talked about ways for him to have conversations so that he can compromise and do more of what he wants to do.  He does not like that idea either because he feels like he is going to have to deal with her being upset with him around those things.  I continue to push him to make some decisions about what he wants to do because his general go to is to just agree to keep doing the same old thing. Interventions: Cognitive Behavioral Therapy, Assertiveness/Communication, and Insight-Oriented, EMDR  Diagnosis:Marital  conflict  Adjustment disorder with mixed anxiety and depressed mood  PTSD (post-traumatic stress disorder)  Plan: Client Abilities/Strengths  Insightful, motivated, supportive husband  Client Treatment Preferences  Outpatient Individual therapy/EMDR  Client Statement of Needs  "I am having nightmares and my therapist wondered if EMDR would be helpful"  Treatment Level  Outpatient Individual therapy  Symptoms  Demonstrates an exaggerated startle response.: No Description Entered (Status: improved). Depressed  or irritable mood.:  Displays a  significant decline in interest and engagement in activities.: (Status: maintained). Displays significant psychological and/or physiological distress resulting from internal and external  clues that are reminiscent of the traumatic event.:(Status: maintained).  Experiences disturbances in sleep.: (Status: maintained). Experiences disturbing  and persistent thoughts, images, and/or perceptions of the traumatic event.:  (Status:maintained). Experiences frequent nightmares.: (Status: improved).  Feelings of hopelessness, worthlessness, or inappropriate guilt.: (Status:  improved). Has been exposed to a traumatic event involving actual or perceived threat of death or  serious injury.:  (Status: maintained). Impairment in social, occupational, or  other areas of functioning.: . Intentionally avoids activities,  places, people, or objects (e.g., up-armored vehicles) that evoke memories of the event.: (Status: improved). Reports difficulty concentrating as  well as feelings of guilt.:  Problems Addressed  Goals 1. Develop healthy thinking patterns and beliefs about self, others, and the world that lead to the alleviation and help prevent the relapse of  depression. Objective Identify and replace thoughts and beliefs that support depression. Target Date:09/17/2023 Frequency: monthly Progress: 80 Modality: individual Related  Interventions 1. Explore  and restructure underlying assumptions and beliefs reflected in biased self-talk that  may put the client at risk for relapse or recurrence. 2. Conduct Cognitive-Behavioral Therapy (see Cognitive Behavior Therapy by Reola Calkins; Overcoming Depression by Agapito Games al.), beginning with helping the client learn the connection among  cognition, depressive feelings, and actions. 2. Eliminate or reduce the negative impact trauma related symptoms have  on social, occupational, and family functioning. Objective Learn and implement personal skills to manage challenging situations related to trauma. Target Date: 09/17/2023 Frequency: monthly Progress: 80 Modality: individual 3. No longer avoids persons, places, activities, and objects that are  reminiscent of the traumatic event. Objective Participate in Eye Movement Desensitization and Reprocessing (EMDR) to reduce emotional distress  related to traumatic thoughts, feelings, and images. Target Date: 09/17/2023 Frequency: monthly Progress: 80 Modality: individual   Related Interventions 1. Utilize Eye Movement Desensitization and Reprocessing (EMDR) to reduce the client's  emotional reactivity to the traumatic event and reduce PTSD symptoms. Objective Learn and implement guided self-dialogue to manage thoughts, feelings, and urges brought on by  encounters with trauma-related situations. Target Date: 09/17/2023 Frequency: monthly Progress: 80 Modality: individual Related Interventions 1. Teach the client a guided self-dialogue procedure in which he/she learns to recognize  maladaptive self-talk, challenges its biases, copes with engendered feelings, overcomes  avoidance, and reinforces his/her accomplishments; review and reinforce progress, problemsolve obstacles. 4. No longer experiences intrusive event recollections, avoidance of event  reminders, intense arousal, or disinterest in activities or  relationships. 5. Thinks about or openly  discusses the traumatic event with others  without experiencing psychological or physiological distress. Diagnosis:  Adjustment Disorder with mixed anxiety and depression Conditions For Discharge Achievement of treatment goals and objectives  Naoki Migliaccio G Miray Mancino, LCSW

## 2023-02-02 ENCOUNTER — Ambulatory Visit (INDEPENDENT_AMBULATORY_CARE_PROVIDER_SITE_OTHER): Payer: Medicare Other | Admitting: Psychology

## 2023-02-02 DIAGNOSIS — F4310 Post-traumatic stress disorder, unspecified: Secondary | ICD-10-CM | POA: Diagnosis not present

## 2023-02-02 DIAGNOSIS — Z630 Problems in relationship with spouse or partner: Secondary | ICD-10-CM

## 2023-02-02 DIAGNOSIS — F4323 Adjustment disorder with mixed anxiety and depressed mood: Secondary | ICD-10-CM | POA: Diagnosis not present

## 2023-02-03 NOTE — Progress Notes (Addendum)
[facility] Counselor/Therapist Progress Note  Patient ID: ARRIE ZUERCHER, MRN: [id number],    Date:02/02/2023  Time Spent:  Time In: 10:00  Time out:10:59  Treatment Type: Individual Therapy  Reported Symptoms: The patient attended a face-to-face individual therapy session in the office today.  The patient reports having feelings of insecurity and depression.  Mental Status Exam: Appearance:  Casual     Behavior: Appropriate  Motor: Normal  Speech/Language:  Normal Rate  Affect: Blunt  Mood: depressed  Thought process: normal  Thought content:   WNL  Sensory/Perceptual disturbances:   WNL  Orientation: oriented to person, place, time/date, and situation  Attention: Good  Concentration: Good  Memory: WNL  Fund of knowledge:  Good  Insight:   Good  Judgment:  Good  Impulse Control: Good   Risk Assessment: Danger to Self:  No Self-injurious Behavior: No Danger to Others: No Duty to Warn:no Physical Aggression / Violence:No  Access to Firearms a concern: No  Gang Involvement:No   Subjective: The patient attended a face-to-face individual therapy session in the office today.  The patient presents as pleasant and cooperative.  The patient seems to have more clarity about his situation today than he has had the last few times have seen him.  The patient reports that he and his wife had a conversation and it seems that that they may be at a place where they can come up with a plan to separate but not separate.  The patient understands that he reacts to his wife's triggers.  We talked about him having a better understanding where he could detach from that and I explained that the reason that therapy is working is that he is gotten to the place where he understands the dynamics that are going on.  We talked about him continuing to try to stay detach from her triggers and outbursts.  He tends to take them on as he is causing the problem.  We talked about a  potential solution being that they stay married and she just moved somewhere else or go somewhere else to find herself and that way they would not have to tell the children anything about them separating or divorcing.  The patient seems to be doing better and he did talk about a couple of dreams that he had that do not seem negative at all.  Interventions: Cognitive Behavioral Therapy, Assertiveness/Communication, and Insight-Oriented, EMDR  Diagnosis:Marital conflict  Adjustment disorder with mixed anxiety and depressed mood  PTSD (post-traumatic stress disorder)  Plan: Client Abilities/Strengths  Insightful, motivated, supportive husband  Client Treatment Preferences  Outpatient Individual therapy/EMDR  Client Statement of Needs  "I am having nightmares and my therapist wondered if EMDR would be helpful"  Treatment Level  Outpatient Individual therapy  Symptoms  Demonstrates an exaggerated startle response.: No Description Entered (Status: improved). Depressed  or irritable mood.:  Displays a  significant decline in interest and engagement in activities.: (Status: maintained). Displays significant psychological and/or physiological distress resulting from internal and external  clues that are reminiscent of the traumatic event.:(Status: maintained).  Experiences disturbances in sleep.: (Status: maintained). Experiences disturbing  and persistent thoughts, images, and/or perceptions of the traumatic event.:  (Status:maintained). Experiences frequent nightmares.: (Status: improved).  Feelings of hopelessness, worthlessness, or inappropriate guilt.: (Status:  improved). Has been exposed to a traumatic event involving actual or perceived threat of death or  serious injury.:  (Status: maintained). Impairment in social, occupational, or  other areas of functioning.: .  Intentionally avoids activities,  places, people, or objects (e.g., up-armored vehicles) that evoke memories of the event.:  (Status: improved). Reports difficulty concentrating as  well as feelings of guilt.:  Problems Addressed  Goals 1. Develop healthy thinking patterns and beliefs about self, others, and the world that lead to the alleviation and help prevent the relapse of  depression. Objective Identify and replace thoughts and beliefs that support depression. Target Date:09/17/2023 Frequency: monthly Progress: 80 Modality: individual Related Interventions 1. Explore and restructure underlying assumptions and beliefs reflected in biased self-talk that  may put the client at risk for relapse or recurrence. 2. Conduct Cognitive-Behavioral Therapy (see Cognitive Behavior Therapy by Reola Calkins; Overcoming Depression by Agapito Games al.), beginning with helping the client learn the connection among  cognition, depressive feelings, and actions. 2. Eliminate or reduce the negative impact trauma related symptoms have  on social, occupational, and family functioning. Objective Learn and implement personal skills to manage challenging situations related to trauma. Target Date: 09/17/2023 Frequency: monthly Progress: 80 Modality: individual 3. No longer avoids persons, places, activities, and objects that are  reminiscent of the traumatic event. Objective Participate in Eye Movement Desensitization and Reprocessing (EMDR) to reduce emotional distress  related to traumatic thoughts, feelings, and images. Target Date: 09/17/2023 Frequency: monthly Progress: 80 Modality: individual   Related Interventions 1. Utilize Eye Movement Desensitization and Reprocessing (EMDR) to reduce the client's  emotional reactivity to the traumatic event and reduce PTSD symptoms. Objective Learn and implement guided self-dialogue to manage thoughts, feelings, and urges brought on by  encounters with trauma-related situations. Target Date: 09/17/2023 Frequency: monthly Progress: 80 Modality: individual Related Interventions 1. Teach  the client a guided self-dialogue procedure in which he/she learns to recognize  maladaptive self-talk, challenges its biases, copes with engendered feelings, overcomes  avoidance, and reinforces his/her accomplishments; review and reinforce progress, problemsolve obstacles. 4. No longer experiences intrusive event recollections, avoidance of event  reminders, intense arousal, or disinterest in activities or  relationships. 5. Thinks about or openly discusses the traumatic event with others  without experiencing psychological or physiological distress. Diagnosis:  Adjustment Disorder with mixed anxiety and depression Conditions For Discharge Achievement of treatment goals and objectives  Bea Duren G Merly Hinkson, LCSW

## 2023-02-23 DIAGNOSIS — Z23 Encounter for immunization: Secondary | ICD-10-CM | POA: Diagnosis not present

## 2023-03-12 ENCOUNTER — Telehealth: Payer: Self-pay | Admitting: Internal Medicine

## 2023-03-12 NOTE — Telephone Encounter (Signed)
Prescription Request  03/12/2023  LOV: 10/07/2022  What is the name of the medication or equipment?  LORazepam (ATIVAN) 1 MG tablet   Have you contacted your pharmacy to request a refill? No   Which pharmacy would you like this sent to?  [id number] [place], [place] - [address] [place] [address] Phone: [phone] Fax: [phone]    Patient notified that their request is being sent to the clinical staff for review and that they should receive a response within 2 business days.   Please advise at Mobile [phone] (mobile)

## 2023-03-16 MED ORDER — LORAZEPAM 1 MG PO TABS: 0.5000 mg | ORAL_TABLET | Freq: Two times a day (BID) | ORAL | 3 refills | Status: DC | PRN

## 2023-03-16 NOTE — Telephone Encounter (Signed)
Okay.  Thanks.

## 2023-03-19 ENCOUNTER — Ambulatory Visit: Payer: Medicare Other | Admitting: Psychology

## 2023-04-06 ENCOUNTER — Encounter: Payer: Self-pay | Admitting: Internal Medicine

## 2023-04-06 ENCOUNTER — Ambulatory Visit: Payer: Medicare Other | Admitting: Internal Medicine

## 2023-04-06 VITALS — BP 120/82 | HR 47 | Temp 98.0°F | Ht 67.0 in | Wt 175.0 lb

## 2023-04-06 DIAGNOSIS — F419 Anxiety disorder, unspecified: Secondary | ICD-10-CM | POA: Diagnosis not present

## 2023-04-06 DIAGNOSIS — Z7984 Long term (current) use of oral hypoglycemic drugs: Secondary | ICD-10-CM | POA: Diagnosis not present

## 2023-04-06 DIAGNOSIS — E1159 Type 2 diabetes mellitus with other circulatory complications: Secondary | ICD-10-CM | POA: Diagnosis not present

## 2023-04-06 DIAGNOSIS — G479 Sleep disorder, unspecified: Secondary | ICD-10-CM

## 2023-04-06 MED ORDER — TADALAFIL 5 MG PO TABS: 5.0000 mg | ORAL_TABLET | Freq: Every day | ORAL | 1 refills | Status: DC

## 2023-04-06 NOTE — Assessment & Plan Note (Signed)
Check A1c On Metformin Well-controlled

## 2023-04-06 NOTE — Progress Notes (Signed)
Subjective:  Patient ID: Jared Alvarez, male    DOB: 01-16-1951  Age: 72 y.o. MRN: [id number]  CC: Medication Problem   HPI Jared Alvarez presents for BPH, ED, anxiety No CP C/o fatigue C/o mucus - he saw ENT F/u GERD - on BID Protonix   Outpatient Medications Prior to Visit  Medication Sig Dispense Refill   acetaminophen (TYLENOL) 500 MG tablet Take 1 tablet (500 mg total) by mouth every 6 (six) hours as needed. 30 tablet 2   alfuzosin (UROXATRAL) 10 MG 24 hr tablet Take 10 mg by mouth daily.     aspirin 81 MG tablet Take 1 tablet (81 mg total) by mouth daily. 30 tablet 2   ciclopirox (PENLAC) 8 % solution Apply topically at bedtime. Apply over nail and surrounding skin. Apply daily over previous coat. After seven (7) days, may remove with alcohol and continue cycle. 6.6 mL 0   colchicine 0.6 MG tablet Take 1 tablet (0.6 mg total) by mouth 2 (two) times daily. If patient has flare up with gout 30 tablet 5   Cyanocobalamin (VITAMIN B-12) 500 MCG SUBL Place 1 tablet (500 mcg total) under the tongue daily. 150 tablet 2   Efinaconazole (JUBLIA) 10 % SOLN Apply 1 application  topically every morning. 4 mL 2   FLUoxetine (PROZAC) 40 MG capsule TAKE 1 CAPSULE (40 MG TOTAL) BY MOUTH DAILY. 90 capsule 1   Homeopathic Products (ARNICARE ARNICA) OINT Apply 1 application topically 2 (two) times daily as needed (arthritis).     ibuprofen (ADVIL) 200 MG tablet Ibuprofen cream PRN     IBUPROFEN-LINIMENT CO by Combination route.     LORazepam (ATIVAN) 1 MG tablet Take 0.5-1 tablets (0.5-1 mg total) by mouth 2 (two) times daily as needed for anxiety. 60 tablet 3   metFORMIN (GLUCOPHAGE) 1000 MG tablet TAKE ONE TABLET BY MOUTH TWICE A DAY WITH A MEAL 180 tablet 3   nitroGLYCERIN (NITROSTAT) 0.4 MG SL tablet Place 1 tablet (0.4 mg total) under the tongue every 5 (five) minutes as needed for chest pain. 25 tablet 3   pantoprazole (PROTONIX) 40 MG tablet TAKE 1 TABLET BY MOUTH TWICE A DAY 180  tablet 3   rosuvastatin (CRESTOR) 20 MG tablet TAKE 1 TABLET (20 MG TOTAL) BY MOUTH 3 (THREE) TIMES A WEEK. MON, WED, AND FRI 36 tablet 4   triamcinolone (NASACORT) 55 MCG/ACT AERO nasal inhaler Place 2 sprays into the nose daily. 1 each 12   triamcinolone cream (KENALOG) 0.1 % Apply 1 application topically 2 (two) times daily. 100 g 0   losartan (COZAAR) 100 MG tablet TAKE 1 TABLET BY MOUTH EVERY DAY 90 tablet 3   tadalafil (CIALIS) 20 MG tablet Take 1 tablet (20 mg total) by mouth every other day as needed for erectile dysfunction. 90 tablet 1   tadalafil (CIALIS) 5 MG tablet Take 5 mg by mouth daily at 6 (six) AM.     No facility-administered medications prior to visit.    ROS: Review of Systems  Constitutional:  Positive for fatigue. Negative for appetite change and unexpected weight change.  HENT:  Negative for congestion, nosebleeds, sneezing, sore throat and trouble swallowing.   Eyes:  Negative for itching and visual disturbance.  Respiratory:  Negative for cough.   Cardiovascular:  Negative for chest pain, palpitations and leg swelling.  Gastrointestinal:  Negative for abdominal distention, blood in stool, diarrhea and nausea.  Genitourinary:  Positive for frequency and urgency. Negative for hematuria.  Musculoskeletal:  Positive for arthralgias. Negative for back pain, gait problem, joint swelling and neck pain.  Skin:  Negative for rash.  Neurological:  Negative for dizziness, tremors, speech difficulty and weakness.  Psychiatric/Behavioral:  Negative for agitation, dysphoric mood, sleep disturbance and suicidal ideas. The patient is nervous/anxious.     Objective:  BP 120/82 (BP Location: Left Arm, Patient Position: Sitting, Cuff Size: Normal)   Pulse (!) 47   Temp 98 F (36.7 C) (Oral)   Ht 5\' 7"  (1.702 m)   Wt 175 lb (79.4 kg)   SpO2 98%   BMI 27.41 kg/m   BP Readings from Last 3 Encounters:  04/06/23 120/82  01/09/23 (!) 152/78  10/07/22 120/68    Wt Readings  from Last 3 Encounters:  04/06/23 175 lb (79.4 kg)  01/09/23 168 lb (76.2 kg)  10/07/22 167 lb (75.8 kg)    Physical Exam Constitutional:      General: He is not in acute distress.    Appearance: He is well-developed. He is obese.     Comments: NAD  Eyes:     Conjunctiva/sclera: Conjunctivae normal.     Pupils: Pupils are equal, round, and reactive to light.  Neck:     Thyroid: No thyromegaly.     Vascular: No JVD.  Cardiovascular:     Rate and Rhythm: Normal rate and regular rhythm.     Heart sounds: Normal heart sounds. No murmur heard.    No friction rub. No gallop.  Pulmonary:     Effort: Pulmonary effort is normal. No respiratory distress.     Breath sounds: Normal breath sounds. No wheezing or rales.  Chest:     Chest wall: No tenderness.  Abdominal:     General: Bowel sounds are normal. There is no distension.     Palpations: Abdomen is soft. There is no mass.     Tenderness: There is no abdominal tenderness. There is no guarding or rebound.  Musculoskeletal:        General: No tenderness. Normal range of motion.     Cervical back: Normal range of motion.  Lymphadenopathy:     Cervical: No cervical adenopathy.  Skin:    General: Skin is warm and dry.     Findings: No rash.  Neurological:     Mental Status: He is alert and oriented to person, place, and time.     Cranial Nerves: No cranial nerve deficit.     Motor: No abnormal muscle tone.     Coordination: Coordination normal.     Gait: Gait normal.     Deep Tendon Reflexes: Reflexes are normal and symmetric.  Psychiatric:        Behavior: Behavior normal.        Thought Content: Thought content normal.        Judgment: Judgment normal.     Lab Results  Component Value Date   WBC 4.7 06/11/2022   HGB 11.8 (L) 06/11/2022   HCT 34.5 (L) 06/11/2022   PLT 205.0 06/11/2022   GLUCOSE 115 (H) 06/11/2022   CHOL 147 06/11/2022   TRIG 41.0 06/11/2022   HDL 75.00 06/11/2022   LDLDIRECT 149.6 12/15/2011    LDLCALC 64 06/11/2022   ALT 9 06/11/2022   AST 12 06/11/2022   NA 142 06/11/2022   K 4.8 06/11/2022   CL 104 06/11/2022   CREATININE 0.97 06/11/2022   BUN 23 06/11/2022   CO2 27 06/11/2022   TSH 1.61 06/11/2022   PSA 0.91  06/11/2022   INR 0.98 06/03/2010   HGBA1C 6.0 06/11/2022   MICROALBUR 1.3 10/15/2020    DG Chest 2 View  Result Date: 01/09/2023 CLINICAL DATA:  Fall this morning.  Chest pain. EXAM: CHEST - 2 VIEW COMPARISON:  None Available. FINDINGS: The heart size and mediastinal contours are within normal limits. Stable tortuosity of thoracic aorta. No evidence of pneumothorax or hemothorax. Both lungs are clear. The visualized skeletal structures are unremarkable. IMPRESSION: No active cardiopulmonary disease. Electronically Signed   By: Danae Orleans M.D.   On: 01/09/2023 14:37    Assessment & Plan:   Problem List Items Addressed This Visit     DM type 2 causing vascular disease (HCC) - Primary   Check A1c On Metformin Well-controlled      Relevant Medications   tadalafil (CIALIS) 5 MG tablet   Anxiety disorder   Elijah Birk is taking Lorazepam at hs prn      Sleep disorder   R/o OSA and RLS Sleep Med referral         Meds ordered this encounter  Medications   tadalafil (CIALIS) 5 MG tablet    Sig: Take 1 tablet (5 mg total) by mouth daily at 6 (six) AM.    Dispense:  180 tablet    Refill:  1      Follow-up: Return in about 3 months (around 07/05/2023) for a follow-up visit.  Sonda Primes, MD

## 2023-04-06 NOTE — Assessment & Plan Note (Signed)
Jared Alvarez is taking Lorazepam at hs prn

## 2023-04-06 NOTE — Patient Instructions (Addendum)
Hold Rosuvastatin x 4 weeks  Gluten free trial for 4-6 weeks. OK to use gluten-free bread and gluten-free pasta.    Gluten-Free Diet for Celiac Disease, Adult The gluten-free diet includes all foods that do not contain gluten. Gluten is a protein that is found in wheat, rye, barley, and some other grains. Following the gluten-free diet is the only treatment for people with celiac disease. It helps to prevent damage to the intestines and improves or eliminates the symptoms of celiac disease. Following the gluten-free diet requires some planning. It can be challenging at first, but it gets easier with time and practice. There are more gluten-free options available today than ever before. If you need help finding gluten-free foods or if you have questions, talk with your diet and nutrition specialist (registered dietitian) or your health care provider. What do I need to know about a gluten-free diet? All fruits, vegetables, and meats are safe to eat and do not contain gluten. When grocery shopping, start by shopping in the produce, meat, and dairy sections. These sections are more likely to contain gluten-free foods. Then move to the aisles that contain packaged foods if you need to. Read all food labels. Gluten is often added to foods. Always check the ingredient list and look for warnings, such as "may contain gluten." Talk with your dietitian or health care provider before taking a gluten-free multivitamin or mineral supplement. Be aware of gluten-free foods having contact with foods that contain gluten (cross-contamination). This can happen at home and with any processed foods. Talk with your health care provider or dietitian about how to reduce the risk of cross-contamination in your home. If you have questions about how a food is processed, ask the manufacturer. What key words help to identify gluten? Foods that list any of these key words on the label usually contain gluten: Wheat, flour,  enriched flour, bromated flour, white flour, durum flour, graham flour, phosphated flour, self-rising flour, semolina, farina, barley (malt), rye, and oats. Starch, dextrin, modified food starch, or cereal. Thickening, fillers, or emulsifiers. Malt flavoring, malt extract, or malt syrup. Hydrolyzed vegetable protein.  In the [place] packaged foods that are gluten-free are required to be labeled "GF." These foods should be easy to identify and are safe to eat. In the [place] food companies are also required to list common food allergens, including wheat, on their labels. Recommended foods Grains Amaranth, bean flours, 100% buckwheat flour, corn, millet, nut flours or nut meals, GF oats, quinoa, rice, sorghum, teff, rice wafers, pure cornmeal tortillas, popcorn, and hot cereals made from cornmeal. Hominy, rice, wild rice. Some Asian rice noodles or bean noodles. Arrowroot starch, corn bran, corn flour, corn germ, cornmeal, corn starch, potato flour, potato starch flour, and rice bran. Plain, brown, and sweet rice flours. Rice polish, soy flour, and tapioca starch. Vegetables All plain fresh, frozen, and canned vegetables. Fruits All plain fresh, frozen, canned, and dried fruits, and 100% fruit juices. Meats and other protein foods All fresh beef, pork, poultry, fish, seafood, and eggs. Fish canned in water, oil, brine, or vegetable broth. Plain nuts and seeds, peanut butter. Some lunch meat and some frankfurters. Dried beans, dried peas, and lentils. Dairy Fresh plain, dry, evaporated, or condensed milk. Cream, butter, sour cream, whipping cream, and most yogurts. Unprocessed cheese, most processed cheeses, some cottage cheese, some cream cheeses. Beverages Coffee, tea, most herbal teas. Carbonated beverages and some root beers. Wine, sake, and distilled spirits, such as gin, vodka, and whiskey. Most hard ciders.  Fats and oils Butter, margarine, vegetable oil, hydrogenated butter, olive oil,  shortening, lard, cream, and some mayonnaise. Some commercial salad dressings. Olives. Sweets and desserts Sugar, honey, some syrups, molasses, jelly, and jam. Plain hard candy, marshmallows, and gumdrops. Pure cocoa powder. Plain chocolate. Custard and some pudding mixes. Gelatin desserts, sorbets, frozen ice pops, and sherbet. Cake, cookies, and other desserts prepared with allowed flours. Some commercial ice creams. Cornstarch, tapioca, and rice puddings. Seasoning and other foods Some canned or frozen soups. Monosodium glutamate (MSG). Cider, rice, and wine vinegar. Baking soda and baking powder. Cream of tartar. Baking and nutritional yeast. Certain soy sauces made without wheat (ask your dietitian about specific brands that are allowed). Nuts, coconut, and chocolate. Salt, pepper, herbs, spices, flavoring extracts, imitation or artificial flavorings, natural flavorings, and food colorings. Some medicines and supplements. Some lip glosses and other cosmetics. Rice syrups. The items listed may not be a complete list. Talk with your dietitian about what dietary choices are best for you. Foods to avoid Grains Barley, bran, bulgur, couscous, cracked wheat, [place], farro, graham, malt, matzo, semolina, wheat germ, and all wheat and rye cereals including spelt and kamut. Cereals containing malt as a flavoring, such as rice cereal. Noodles, spaghetti, macaroni, most packaged rice mixes, and all mixes containing wheat, rye, barley, or triticale. Vegetables Most creamed vegetables and most vegetables canned in sauces. Some commercially prepared vegetables and salads. Fruits Thickened or prepared fruits and some pie fillings. Some fruit snacks and fruit roll-ups. Meats and other protein foods Any meat or meat alternative containing wheat, rye, barley, or gluten stabilizers. These are often marinated or packaged meats and lunch meats. Bread-containing products, such as Swiss steak, croquettes, meatballs, and  meatloaf. Most tuna canned in vegetable broth and [place] with hydrolyzed vegetable protein (HVP) injected as part of the basting. Seitan. Imitation fish. Eggs in sauces made from ingredients to avoid. Dairy Commercial chocolate milk drinks and malted milk. Some non-dairy creamers. Any cheese product containing ingredients to avoid. Beverages Certain cereal beverages. Beer, ale, malted milk, and some root beers. Some hard ciders. Some instant flavored coffees. Some herbal teas made with barley or with barley malt added. Fats and oils Some commercial salad dressings. Sour cream containing modified food starch. Sweets and desserts Some toffees. Chocolate-coated nuts (may be rolled in wheat flour) and some commercial candies and candy bars. Most cakes, cookies, donuts, pastries, and other baked goods. Some commercial ice cream. Ice cream cones. Commercially prepared mixes for cakes, cookies, and other desserts. Bread pudding and other puddings thickened with flour. Products containing brown rice syrup made with barley malt enzyme. Desserts and sweets made with malt flavoring. Seasoning and other foods Some curry powders, some dry seasoning mixes, some gravy extracts, some meat sauces, some ketchups, some prepared mustards, and horseradish. Certain soy sauces. Malt vinegar. Bouillon and bouillon cubes that contain HVP. Some chip dips, and some chewing gum. Yeast extract. Brewer's yeast. Caramel color. Some medicines and supplements. Some lip glosses and other cosmetics. The items listed may not be a complete list. Talk with your dietitian about what dietary choices are best for you. Summary Gluten is a protein that is found in wheat, rye, barley, and some other grains. The gluten-free diet includes all foods that do not contain gluten. If you need help finding gluten-free foods or if you have questions, talk with your diet and nutrition specialist (registered dietitian) or your health care provider. Read  all food labels. Gluten is often added to foods.  Always check the ingredient list and look for warnings, such as "may contain gluten." This information is not intended to replace advice given to you by your health care provider. Make sure you discuss any questions you have with your health care provider. Document Released: 04/20/2005 Document Revised: 02/03/2016 Document Reviewed: 02/03/2016 Elsevier Interactive Patient Education  2018 [employer].

## 2023-04-06 NOTE — Assessment & Plan Note (Signed)
R/o OSA and RLS Sleep Med referral

## 2023-04-08 DIAGNOSIS — N5201 Erectile dysfunction due to arterial insufficiency: Secondary | ICD-10-CM | POA: Diagnosis not present

## 2023-04-08 DIAGNOSIS — R3912 Poor urinary stream: Secondary | ICD-10-CM | POA: Diagnosis not present

## 2023-04-08 DIAGNOSIS — N401 Enlarged prostate with lower urinary tract symptoms: Secondary | ICD-10-CM | POA: Diagnosis not present

## 2023-04-13 ENCOUNTER — Other Ambulatory Visit: Payer: Self-pay | Admitting: Internal Medicine

## 2023-04-26 ENCOUNTER — Telehealth: Payer: Self-pay | Admitting: Cardiovascular Disease

## 2023-04-26 DIAGNOSIS — I712 Thoracic aortic aneurysm, without rupture, unspecified: Secondary | ICD-10-CM

## 2023-04-26 NOTE — Telephone Encounter (Signed)
Will forward to Dr. [name] for order of test.

## 2023-04-26 NOTE — Telephone Encounter (Signed)
Left message for patient to call back  

## 2023-04-26 NOTE — Telephone Encounter (Signed)
  The patient said Dr. [name] informed him during his last visit that he wanted to schedule a scan. He forgot what kind of scan it was, but Dr. [name] mentioned it was something new. The patient would like to have the scan done prior to his appointment with Dr. [name] on 08/18/23.

## 2023-04-27 NOTE — Telephone Encounter (Signed)
Patient returned call. Informed patient that Dr. Eden Alvarez wants to get a Gated chest CTA for aortic aneurysm. Placed order and informed patient that someone will call him from [facility] to schedule. Patient verbalized understanding.

## 2023-04-29 ENCOUNTER — Telehealth: Payer: Self-pay | Admitting: Internal Medicine

## 2023-04-29 NOTE — Telephone Encounter (Signed)
Patient has covid and has a really bad headache and non stop coughing has not gotten any sleep since he has gotten covid he would like something called in he would like a call back

## 2023-04-29 NOTE — Telephone Encounter (Signed)
Copied from CRM [id number]. Topic: Clinical - Medication Question >> Apr 29, 2023 11:26 AM Presley Raddle C wrote: Reason for CRM: Patient called and stated that he spoke with another agent earlier this morning in regards to the possibility of receiving a prescription to treat Covid. Patient was advised that another message would be sent back to follow up on the status of the previous message. Please call and advise at : [phone].

## 2023-04-30 MED ORDER — HYDROCODONE BIT-HOMATROP MBR 5-1.5 MG/5ML PO SOLN: 5.0000 mL | Freq: Three times a day (TID) | ORAL | 0 refills | Status: DC | PRN

## 2023-04-30 NOTE — Telephone Encounter (Signed)
Okay Hycodan.  Office visit if not better.  Thank you

## 2023-04-30 NOTE — Telephone Encounter (Signed)
 Spoke with the pt and was able to inform him of Dr. Loren Racer instructions. Pt has stated understanding.

## 2023-05-11 ENCOUNTER — Ambulatory Visit (HOSPITAL_COMMUNITY): Payer: Medicare Other

## 2023-05-21 ENCOUNTER — Ambulatory Visit (HOSPITAL_COMMUNITY)
Admission: RE | Admit: 2023-05-21 | Discharge: 2023-05-21 | Disposition: A | Discharge status: Home / Self Care | Payer: Medicare Other | Source: Ambulatory Visit | Attending: Cardiovascular Disease | Admitting: Cardiovascular Disease

## 2023-05-21 DIAGNOSIS — I712 Thoracic aortic aneurysm, without rupture, unspecified: Secondary | ICD-10-CM | POA: Insufficient documentation

## 2023-05-21 DIAGNOSIS — I2510 Atherosclerotic heart disease of native coronary artery without angina pectoris: Secondary | ICD-10-CM | POA: Diagnosis not present

## 2023-05-21 DIAGNOSIS — I700 Atherosclerosis of aorta: Secondary | ICD-10-CM | POA: Diagnosis not present

## 2023-05-21 DIAGNOSIS — J9811 Atelectasis: Secondary | ICD-10-CM | POA: Diagnosis not present

## 2023-05-21 MED ORDER — IOHEXOL 350 MG/ML SOLN
75.0000 mL | Freq: Once | INTRAVENOUS | Status: AC | PRN
  Administered 2023-05-21: 75 mL via INTRAVENOUS

## 2023-06-04 ENCOUNTER — Encounter: Payer: Self-pay | Admitting: Cardiovascular Disease

## 2023-07-14 ENCOUNTER — Other Ambulatory Visit: Payer: Self-pay | Admitting: Internal Medicine

## 2023-07-22 ENCOUNTER — Ambulatory Visit: Admitting: Psychology

## 2023-07-22 DIAGNOSIS — F341 Dysthymic disorder: Secondary | ICD-10-CM

## 2023-07-22 DIAGNOSIS — F4323 Adjustment disorder with mixed anxiety and depressed mood: Secondary | ICD-10-CM

## 2023-07-22 DIAGNOSIS — F411 Generalized anxiety disorder: Secondary | ICD-10-CM

## 2023-07-22 NOTE — Progress Notes (Addendum)
 Initial Adult Exam  Name: Jared Alvarez Date: 07/22/2023 MRN: [id number] DOB: 1950/08/14 PCP: Genia Kettering, MD  Time spent:  Guardian/Payee:  self    Paperwork requested: No   Reason for Visit /Presenting Problem: seeking referral to meet psychotherapeutic goals  Mental Status Exam: Appearance:   Casual     Behavior:  Appropriate  Motor:  Normal  Speech/Language:   Normal Rate  Affect:  Appropriate  Mood:  normal  Thought process:  normal  Thought content:    WNL  Sensory/Perceptual disturbances:    WNL  Orientation:  oriented to person, place, and situation  Attention:  Good  Concentration:  Good  Memory:  WNL  Fund of knowledge:   Good  Insight:    Good  Judgment:   Fair  Impulse Control:  Good     Reported Symptoms:  Patient reports symtoms of worry, anxiety and depressed mood  Risk Assessment: Danger to Self:  No Self-injurious Behavior: No Danger to Others: No Duty to Warn:no Physical Aggression / Violence:No  Access to Firearms a concern: No  Gang Involvement:No  Patient / guardian was educated about steps to take if suicide or homicide risk level increases between visits: yes While future psychiatric events cannot be accurately predicted, the patient does not currently require acute inpatient psychiatric care and does not currently meet [place]  involuntary commitment criteria.  Substance Abuse History: Current substance abuse: No     Past Psychiatric History:   Previous psychological history is significant for ADHD, anxiety, and depression Outpatient Providers:Merriel Zinger, Bambi Cottle History of Psych Hospitalization: No  Psychological Testing:  N/A    Abuse History:  Victim of: No.,  N/A    Report needed: No. Victim of Neglect:No. Perpetrator of  N/A   Witness / Exposure to Domestic Violence: No   Protective Services Involvement: No  Witness to [employer] Violence:  No   Family History:  Family History   Problem Relation Age of Onset   Heart disease Father    Arthritis Sister    Lupus Sister    Heart disease Brother    Coronary artery disease Other    Hypertension Other    Colon cancer Neg Hx    Esophageal cancer Neg Hx    Stomach cancer Neg Hx    Rectal cancer Neg Hx    Colon polyps Neg Hx     Living situation: the patient lives with their spouse  Sexual Orientation: Straight  Relationship Status: married  Name of spouse / other:Karen If a parent, number of children / ages:2 adult daughter and adult son  Support Systems: N/A  Surveyor, quantity Stress:  No   Income/Employment/Disability: Neurosurgeon: No   Educational History: Education: Risk manager: unknown  Any cultural differences that may affect / interfere with treatment:  not applicable   Recreation/Hobbies: music  Stressors: Marital or family conflict   Other: self-identity    Strengths: limited  Barriers:  relative isolation   Legal History: Pending legal issue / charges: The patient has no significant history of legal issues. History of legal issue / charges:  N/A  Medical History/Surgical History: reviewed Past Medical History:  Diagnosis Date   Allergy    Anxiety    no per pt   Aortic aneurysm (HCC)    Coronary artery disease    Depression    no per pt   DM type 2 (diabetes mellitus, type  2) (HCC)    GERD (gastroesophageal reflux disease)    Gout    Hearing loss    Hyperlipidemia    Hypertension    Hypogonadism male    Inguinal hernia    right   LBP (low back pain)    Leg cramps    Osteoarthritis    Skin neoplasm    Tinnitus    since 1997   Vertigo    Vitamin B12 deficiency     Past Surgical History:  Procedure Laterality Date   COLONOSCOPY     FOOT SURGERY     07-2019 triple bone fusion   FOOT SURGERY  08/2020   repeat fusion   heart stent     in 1997   INGUINAL HERNIA REPAIR     POLYPECTOMY       Medications: Current Outpatient Medications  Medication Sig Dispense Refill   acetaminophen  (TYLENOL ) 500 MG tablet Take 1 tablet (500 mg total) by mouth every 6 (six) hours as needed. 30 tablet 2   alfuzosin (UROXATRAL) 10 MG 24 hr tablet Take 10 mg by mouth daily.     aspirin  81 MG tablet Take 1 tablet (81 mg total) by mouth daily. 30 tablet 2   ciclopirox  (PENLAC ) 8 % solution Apply topically at bedtime. Apply over nail and surrounding skin. Apply daily over previous coat. After seven (7) days, may remove with alcohol and continue cycle. 6.6 mL 0   colchicine  0.6 MG tablet Take 1 tablet (0.6 mg total) by mouth 2 (two) times daily. If patient has flare up with gout 30 tablet 5   Cyanocobalamin  (VITAMIN B-12) 500 MCG SUBL Place 1 tablet (500 mcg total) under the tongue daily. 150 tablet 2   Efinaconazole  (JUBLIA ) 10 % SOLN Apply 1 application  topically every morning. 4 mL 2   FLUoxetine  (PROZAC ) 40 MG capsule TAKE 1 CAPSULE (40 MG TOTAL) BY MOUTH DAILY. 90 capsule 1   Homeopathic Products (ARNICARE ARNICA) OINT Apply 1 application topically 2 (two) times daily as needed (arthritis).     HYDROcodone  bit-homatropine (HYCODAN) 5-1.5 MG/5ML syrup Take 5 mLs by mouth every 8 (eight) hours as needed for cough. 240 mL 0   ibuprofen (ADVIL) 200 MG tablet Ibuprofen cream PRN     IBUPROFEN-LINIMENT CO by Combination route.     LORazepam  (ATIVAN ) 1 MG tablet Take 0.5-1 tablets (0.5-1 mg total) by mouth 2 (two) times daily as needed for anxiety. 60 tablet 3   losartan  (COZAAR ) 100 MG tablet TAKE 1 TABLET BY MOUTH EVERY DAY 90 tablet 3   metFORMIN  (GLUCOPHAGE ) 1000 MG tablet TAKE ONE TABLET BY MOUTH TWICE A DAY WITH A MEAL 180 tablet 3   nitroGLYCERIN  (NITROSTAT ) 0.4 MG SL tablet Place 1 tablet (0.4 mg total) under the tongue every 5 (five) minutes as needed for chest pain. 25 tablet 3   pantoprazole  (PROTONIX ) 40 MG tablet TAKE 1 TABLET BY MOUTH TWICE A DAY 180 tablet 3   rosuvastatin  (CRESTOR ) 20 MG  tablet TAKE 1 TABLET (20 MG TOTAL) BY MOUTH 3 (THREE) TIMES A WEEK. MON, WED, AND FRI 36 tablet 4   tadalafil  (CIALIS ) 5 MG tablet Take 1 tablet (5 mg total) by mouth daily at 6 (six) AM. 180 tablet 1   triamcinolone  (NASACORT ) 55 MCG/ACT AERO nasal inhaler Place 2 sprays into the nose daily. 1 each 12   triamcinolone  cream (KENALOG ) 0.1 % Apply 1 application topically 2 (two) times daily. 100 g 0   No current facility-administered  medications for this visit.    Allergies  Allergen Reactions   Atorvastatin     REACTION: pains   Bupropion Hcl     REACTION: side effects   Doxazosin      Dizzy, syncope    Duloxetine     REACTION: dizzy   Fluticasone     Other reaction(s): other/intolerance   Nexium  [Esomeprazole  Magnesium ]     Rash on legs   Simvastatin     REACTION: pain   Venlafaxine     REACTION: side effects   Viibryd  [Vilazodone  Hcl]     nightmares   Vilazodone      Other reaction(s): psychological reaction Nightmares    Ketorolac  Palpitations   Tamsulosin  Palpitations   Patient was seen face to face in Provider's office. Initial session: He is back after a couple of years after having EMDR with Crisoforo Dolores for about a year. He says he is still dealing with many of the same issues of depression, poor relationship and various anxieties. He has made the decision to go into a "different direction" and wants to reconvene. He does not want to focus on his marriage and rather wants to get to understand himself better and set realistic expectation and goals. He knows he likely has ADHD and want to understand what he can do about it. He also want strategies to be less agitated and to be more positive. He is working on self care with exercise, positive self talk, weight management. Wants suggestions for how to explore these issues. He does say he has been able to embrace his selfishness and creativity. Wife is completely retired (x2 years). He says that, as he evaluates relationship, the  thinks that wife has male characteristics in a male body and in some ways, he feels feminine characterics in a male body. Says "I'm not saying I'm gay", but there is some synergy that also causes problems. Says wife spends much of her time on the phone. [name] spends his time working out (swimming), having lunch, hanging with grandchildren. Not as involved with his music as he would like. Wife wants to spend more time together than he wants to because he does enjoy being alone. He says he needs to decide how to interact with wife, saying "she intimidates" him.  Daughters are doing well. Oldest daughter after Chrys Cranker was working as a reporter, but decided she didn't like it. She is getting a Scientist, water quality in [employer] and is working part time. She also lives with a guy. Other daughter in [place]  that went into fashion, has a great job in [place]  and is making very good money. His son is in [place] with wife and has 2 kids (2 different moms). Son and wife both have jobs and Hinton Luis spends a lot of time with them.  He is most interested on what it takes for him to be happy. Needs to execute his interests given current life circumstances. Diagnoses:  Adjustment disorder with mixed anxiety and depressed mood  Treatment Plan/Goals: Patient returned to get a referral for ongoing psychotherapy to meet the above goals (stated in note). He wanted to consult with this provider given our professional history together. Would like counseling to focus on issues related to other factors than those we had addressed together in treatment. Will make referral to another clinician based on criteria discussed with the patient.   Plan of Care: Outpatient psychotherapy.

## 2023-08-02 DIAGNOSIS — F411 Generalized anxiety disorder: Secondary | ICD-10-CM | POA: Diagnosis not present

## 2023-08-03 ENCOUNTER — Other Ambulatory Visit: Payer: Self-pay | Admitting: Internal Medicine

## 2023-08-10 NOTE — Progress Notes (Signed)
 CARDIOLOGY CONSULT NOTE       Patient ID: Jared Alvarez MRN: [id number] DOB/AGE: 11/15/1950 73 y.o.  Admit date: (Not on file) Referring Physician: [name] Primary Physician: Genia Kettering, MD Primary Cardiologist: Stann Earnest     HPI:  73 y.o. referred by Dr Amparo Kansas for CAD .History of stent to LAD in 1997 in [place] .  CRF;s include HTN, HLD , DM Known false positive ECG;s on stress testing normal perfusion on test 01/25/18 History of neurally mediated syncope since teenage years Has had PVC;s in past with no significant NSVT  Monitor with nocturnal bradycardia so has not been on beta blocker EF normal on TTE 01/06/18 Thoracic aorta 4.2 cm on CT 2019   Sees Dr Elvin Hammer for colon polyps 3 more polyps removed 05/13/21   Lived  in [place] during COVID  Now back in [place] and Saw a cardiologist at [facility] He indicates having a CT scan spring 2023  and aortic aneurysm gone down in size   He still plays the base. He is active swimming daily and renovating a house. BP low and norvasc decreased 09/26/21   He has had a couple of vagal like episodes with low BP He is taking cialis in am and cardura at night for his prostate which is not ideal. Told him to ask Dr Amparo Kansas about proscar or non vasodilatory Rx Or referral to urology for more definitive Rx that would not require vasodilators   Gated chest CTA 05/26/23 only 3.8 cm ? Prior measurement artifact due to blurring    ROS All other systems reviewed and negative except as noted above  Past Medical History:  Diagnosis Date   Allergy    Anxiety    no per pt   Aortic aneurysm (HCC)    Coronary artery disease    Depression    no per pt   DM type 2 (diabetes mellitus, type 2) (HCC)    GERD (gastroesophageal reflux disease)    Gout    Hearing loss    Hyperlipidemia    Hypertension    Hypogonadism male    Inguinal hernia    right   LBP (low back pain)    Leg cramps    Osteoarthritis    Skin neoplasm    Tinnitus    since 1997    Vertigo    Vitamin B12 deficiency     Family History  Problem Relation Age of Onset   Heart disease Father    Arthritis Sister    Lupus Sister    Heart disease Brother    Coronary artery disease Other    Hypertension Other    Colon cancer Neg Hx    Esophageal cancer Neg Hx    Stomach cancer Neg Hx    Rectal cancer Neg Hx    Colon polyps Neg Hx     Social History   Socioeconomic History   Marital status: Married    Spouse name: Not on file   Number of children: Not on file   Years of education: Not on file   Highest education level: Not on file  Occupational History   Occupation: United Engineer, maintenance    Employer: [employer]  Tobacco Use   Smoking status: Never   Smokeless tobacco: Never  Vaping Use   Vaping status: Never Used  Substance and Sexual Activity   Alcohol use: Yes    Alcohol/week: 7.0 standard drinks of alcohol    Types: 7 Glasses of wine per week  Comment: socially   Drug use: No   Sexual activity: Yes  Other Topics Concern   Not on file  Social History Narrative   Not on file   Social Drivers of Health   Financial Resource Strain: Low Risk  (02/05/2021)   Overall Financial Resource Strain (CARDIA)    Difficulty of Paying Living Expenses: Not hard at all  Food Insecurity: No Food Insecurity (02/05/2021)   Hunger Vital Sign    Worried About Running Out of Food in the Last Year: Never true    Ran Out of Food in the Last Year: Never true  Transportation Needs: No Transportation Needs (02/05/2021)   PRAPARE - Administrator, Civil Service (Medical): No    Lack of Transportation (Non-Medical): No  Physical Activity: Sufficiently Active (02/05/2021)   Exercise Vital Sign    Days of Exercise per Week: 7 days    Minutes of Exercise per Session: 30 min  Stress: No Stress Concern Present (02/05/2021)   [employer] of Occupational Health - Occupational Stress Questionnaire    Feeling of Stress : Not at all  Social  Connections: Unknown (02/05/2021)   Social Connection and Isolation Panel [NHANES]    Frequency of Communication with Friends and Family: More than three times a week    Frequency of Social Gatherings with Friends and Family: More than three times a week    Attends Religious Services: Patient declined    Database administrator or Organizations: Patient declined    Attends Banker Meetings: Patient declined    Marital Status: Married  Catering manager Violence: Not At Risk (02/05/2021)   Humiliation, Afraid, Rape, and Kick questionnaire    Fear of Current or Ex-Partner: No    Emotionally Abused: No    Physically Abused: No    Sexually Abused: No    Past Surgical History:  Procedure Laterality Date   COLONOSCOPY     FOOT SURGERY     07-2019 triple bone fusion   FOOT SURGERY  08/2020   repeat fusion   heart stent     in 1997   INGUINAL HERNIA REPAIR     POLYPECTOMY        Current Outpatient Medications:    acetaminophen (TYLENOL) 500 MG tablet, Take 1 tablet (500 mg total) by mouth every 6 (six) hours as needed., Disp: 30 tablet, Rfl: 2   alfuzosin (UROXATRAL) 10 MG 24 hr tablet, Take 10 mg by mouth daily., Disp: , Rfl:    aspirin 81 MG tablet, Take 1 tablet (81 mg total) by mouth daily., Disp: 30 tablet, Rfl: 2   ciclopirox (PENLAC) 8 % solution, APPLY TOPICALLY AT BEDTIME OVER NAIL AND SURROUNDING SKIN. APPLY DAILY OVER PREVIOUS COAT. AFTER SEVEN 7 DAYS, MAY REMOVE WITH ALCOHOL AND CONTINUE CYCLE., Disp: 6.6 mL, Rfl: 0   colchicine 0.6 MG tablet, Take 1 tablet (0.6 mg total) by mouth 2 (two) times daily. If patient has flare up with gout, Disp: 30 tablet, Rfl: 5   Cyanocobalamin (VITAMIN B-12) 500 MCG SUBL, Place 1 tablet (500 mcg total) under the tongue daily., Disp: 150 tablet, Rfl: 2   FLUoxetine (PROZAC) 40 MG capsule, TAKE 1 CAPSULE (40 MG TOTAL) BY MOUTH DAILY., Disp: 90 capsule, Rfl: 1   Homeopathic Products (ARNICARE ARNICA) OINT, Apply 1 application topically 2  (two) times daily as needed (arthritis)., Disp: , Rfl:    ibuprofen (ADVIL) 200 MG tablet, Ibuprofen cream PRN, Disp: , Rfl:    IBUPROFEN-LINIMENT  CO, by Combination route., Disp: , Rfl:    LORazepam (ATIVAN) 1 MG tablet, Take 0.5-1 tablets (0.5-1 mg total) by mouth 2 (two) times daily as needed for anxiety., Disp: 60 tablet, Rfl: 3   losartan (COZAAR) 100 MG tablet, TAKE 1 TABLET BY MOUTH EVERY DAY, Disp: 90 tablet, Rfl: 3   metFORMIN (GLUCOPHAGE) 1000 MG tablet, TAKE ONE TABLET BY MOUTH TWICE A DAY WITH A MEAL, Disp: 180 tablet, Rfl: 3   nitroGLYCERIN (NITROSTAT) 0.4 MG SL tablet, Place 1 tablet (0.4 mg total) under the tongue every 5 (five) minutes as needed for chest pain., Disp: 25 tablet, Rfl: 3   pantoprazole (PROTONIX) 40 MG tablet, TAKE 1 TABLET BY MOUTH TWICE A DAY, Disp: 180 tablet, Rfl: 3   rosuvastatin (CRESTOR) 20 MG tablet, TAKE 1 TABLET (20 MG TOTAL) BY MOUTH 3 (THREE) TIMES A WEEK. MON, WED, AND FRI, Disp: 36 tablet, Rfl: 4   tadalafil (CIALIS) 5 MG tablet, Take 1 tablet (5 mg total) by mouth daily at 6 (six) AM., Disp: 180 tablet, Rfl: 1   triamcinolone (NASACORT) 55 MCG/ACT AERO nasal inhaler, Place 2 sprays into the nose daily., Disp: 1 each, Rfl: 12   triamcinolone cream (KENALOG) 0.1 %, Apply 1 application topically 2 (two) times daily., Disp: 100 g, Rfl: 0   Efinaconazole (JUBLIA) 10 % SOLN, Apply 1 application  topically every morning. (Patient not taking: Reported on 08/18/2023), Disp: 4 mL, Rfl: 2   HYDROcodone bit-homatropine (HYCODAN) 5-1.5 MG/5ML syrup, Take 5 mLs by mouth every 8 (eight) hours as needed for cough. (Patient not taking: Reported on 08/18/2023), Disp: 240 mL, Rfl: 0     Physical Exam: Blood pressure 118/62, pulse (!) 55, height 5\' 7"  (1.702 m), weight 170 lb (77.1 kg), SpO2 95%.    Affect appropriate Healthy:  appears stated age HEENT: normal Neck supple with no adenopathy JVP normal no bruits no thyromegaly Lungs clear with no wheezing and good  diaphragmatic motion Heart:  S1/S2 no murmur, no rub, gallop or click PMI normal Abdomen: benighn, BS positve, no tenderness, no AAA no bruit.  No HSM or HJR Distal pulses intact with no bruits No edema Neuro non-focal Skin warm and dry No muscular weakness   Labs:   Lab Results  Component Value Date   WBC 4.7 06/11/2022   HGB 11.8 (L) 06/11/2022   HCT 34.5 (L) 06/11/2022   MCV 94.4 06/11/2022   PLT 205.0 06/11/2022    No results for input(s): "NA", "K", "CL", "CO2", "BUN", "CREATININE", "CALCIUM", "PROT", "BILITOT", "ALKPHOS", "ALT", "AST", "GLUCOSE" in the last 168 hours.  Invalid input(s): "LABALBU"  No results found for: "CKTOTAL", "CKMB", "CKMBINDEX", "TROPONINI"  Lab Results  Component Value Date   CHOL 147 06/11/2022   CHOL 142 07/23/2021   CHOL 139 10/15/2020   Lab Results  Component Value Date   HDL 75.00 06/11/2022   HDL 67.90 07/23/2021   HDL 57.40 10/15/2020   Lab Results  Component Value Date   LDLCALC 64 06/11/2022   LDLCALC 68 07/23/2021   LDLCALC 72 10/15/2020   Lab Results  Component Value Date   TRIG 41.0 06/11/2022   TRIG 29.0 07/23/2021   TRIG 47.0 10/15/2020   Lab Results  Component Value Date   CHOLHDL 2 06/11/2022   CHOLHDL 2 07/23/2021   CHOLHDL 2 10/15/2020   Lab Results  Component Value Date   LDLDIRECT 149.6 12/15/2011      Radiology: No results found.  EKG: 08/18/2023 rate 57 normal  ASSESSMENT AND PLAN:   CAD: stent to LAD 1997 in [place] . Normal perfusion on myovue 2019 with known false positive treadmill tests No beta blocker with ambient bradycardia.  ASA/Statin Active no angina observe   HTN:  BP low d/c norvasc all together  DM:  Discussed low carb diet.  Target hemoglobin A1c is 6.5 or less.  Continue current medications.  A1c 6.4  GI:  post colonoscopy with polyp removal f/u Dr Elvin Hammer  HLD:  on statin LDL 72 10/15/20 Aneurysm:  Ascending thoracic aorta only 3.8 cm on 05/26/23  Prostate:  f/u primary consider  stopping cardura/cialis and using proscar or non vasodilatory Rx or referral to GU given his propensity for vagal episodes and pre syncope   F/U in a year   Signed: Janelle Mediate 08/18/2023, 11:23 AM

## 2023-08-18 ENCOUNTER — Ambulatory Visit: Payer: Medicare Other | Attending: Cardiovascular Disease | Admitting: Cardiovascular Disease

## 2023-08-18 ENCOUNTER — Encounter: Payer: Self-pay | Admitting: Cardiovascular Disease

## 2023-08-18 VITALS — BP 118/62 | HR 55 | Ht 67.0 in | Wt 170.0 lb

## 2023-08-18 DIAGNOSIS — I712 Thoracic aortic aneurysm, without rupture, unspecified: Secondary | ICD-10-CM | POA: Diagnosis not present

## 2023-08-18 DIAGNOSIS — I2510 Atherosclerotic heart disease of native coronary artery without angina pectoris: Secondary | ICD-10-CM | POA: Diagnosis not present

## 2023-08-18 DIAGNOSIS — E785 Hyperlipidemia, unspecified: Secondary | ICD-10-CM | POA: Diagnosis not present

## 2023-08-18 NOTE — Patient Instructions (Signed)
 Medication Instructions:  Your physician recommends that you continue on your current medications as directed. Please refer to the Current Medication list given to you today.  *If you need a refill on your cardiac medications before your next appointment, please call your pharmacy*  Lab Work: If you have labs (blood work) drawn today and your tests are completely normal, you will receive your results only by: MyChart Message (if you have MyChart) OR A paper copy in the mail If you have any lab test that is abnormal or we need to change your treatment, we will call you to review the results.  Testing/Procedures: None ordered today.  Follow-Up: At [facility], you and your health needs are our priority.  As part of our continuing mission to provide you with exceptional heart care, our providers are all part of one team.  This team includes your primary Cardiologist (physician) and Advanced Practice Providers or APPs (Physician Assistants and Nurse Practitioners) who all work together to provide you with the care you need, when you need it.  Your next appointment:   1 year(s)  Provider:   Charlton Haws, MD     We recommend signing up for the patient portal called "MyChart".  Sign up information is provided on this After Visit Summary.  MyChart is used to connect with patients for Virtual Visits (Telemedicine).  Patients are able to view lab/test results, encounter notes, upcoming appointments, etc.  Non-urgent messages can be sent to your provider as well.   To learn more about what you can do with MyChart, go to [email or web address].   Other Instructions       1st Floor: - Lobby - Registration  - Pharmacy  - Lab - Cafe  2nd Floor: - PV Lab - Diagnostic Testing (echo, CT, nuclear med)  3rd Floor: - Vacant  4th Floor: - TCTS (cardiothoracic surgery) - AFib Clinic - Structural Heart Clinic - Vascular Surgery  - Vascular Ultrasound  5th Floor: - HeartCare  Cardiology (general and EP) - Clinical Pharmacy for coumadin, hypertension, lipid, weight-loss medications, and med management appointments    Valet parking services will be available as well.

## 2023-09-03 ENCOUNTER — Other Ambulatory Visit: Payer: Self-pay | Admitting: Internal Medicine

## 2023-09-09 DIAGNOSIS — F411 Generalized anxiety disorder: Secondary | ICD-10-CM | POA: Diagnosis not present

## 2023-09-16 DIAGNOSIS — F411 Generalized anxiety disorder: Secondary | ICD-10-CM | POA: Diagnosis not present

## 2023-09-30 DIAGNOSIS — F411 Generalized anxiety disorder: Secondary | ICD-10-CM | POA: Diagnosis not present

## 2023-10-01 ENCOUNTER — Encounter: Payer: Self-pay | Admitting: Internal Medicine

## 2023-10-01 DIAGNOSIS — Z0000 Encounter for general adult medical examination without abnormal findings: Secondary | ICD-10-CM

## 2023-10-01 DIAGNOSIS — E1159 Type 2 diabetes mellitus with other circulatory complications: Secondary | ICD-10-CM

## 2023-10-01 DIAGNOSIS — R972 Elevated prostate specific antigen [PSA]: Secondary | ICD-10-CM

## 2023-10-01 DIAGNOSIS — E785 Hyperlipidemia, unspecified: Secondary | ICD-10-CM

## 2023-10-01 DIAGNOSIS — E538 Deficiency of other specified B group vitamins: Secondary | ICD-10-CM

## 2023-10-05 ENCOUNTER — Telehealth: Payer: Self-pay | Admitting: Internal Medicine

## 2023-10-05 NOTE — Telephone Encounter (Signed)
 Copied from CRM [id number]. Topic: General - Other >> Oct 05, 2023 11:53 AM [place] J wrote: Reason for CRM: Patient calling in to schedule his appointment for blood work. I was able to relay the message from the medical assistant, to the patient in reference to following up 1-months out. The patient does not want to wait out that long so we scheduled his blood work for June 17th and his follow-up on June 24th. >> Oct 05, 2023 11:57 AM [place] J wrote: Correction: Appointment is a 6 month follow-up. I misread initial message that was sent out regarding the patient's need for scheduling.

## 2023-10-05 NOTE — Telephone Encounter (Signed)
 Copied from CRM [id number]. Topic: Clinical - Request for Lab/Test Order >> Oct 05, 2023 11:55 AM [place] J wrote: Reason for CRM: Patient scheduled his 6 month follow-up for labs on June 17th and is needing lab orders sent in per Sandria Cruise CMA.

## 2023-10-07 DIAGNOSIS — F411 Generalized anxiety disorder: Secondary | ICD-10-CM | POA: Diagnosis not present

## 2023-10-11 ENCOUNTER — Other Ambulatory Visit: Payer: Self-pay | Admitting: Internal Medicine

## 2023-10-19 ENCOUNTER — Other Ambulatory Visit

## 2023-10-22 ENCOUNTER — Other Ambulatory Visit (INDEPENDENT_AMBULATORY_CARE_PROVIDER_SITE_OTHER)

## 2023-10-22 ENCOUNTER — Other Ambulatory Visit: Payer: Self-pay | Admitting: Internal Medicine

## 2023-10-22 DIAGNOSIS — E538 Deficiency of other specified B group vitamins: Secondary | ICD-10-CM | POA: Diagnosis not present

## 2023-10-22 DIAGNOSIS — I10 Essential (primary) hypertension: Secondary | ICD-10-CM | POA: Diagnosis not present

## 2023-10-22 DIAGNOSIS — Z0000 Encounter for general adult medical examination without abnormal findings: Secondary | ICD-10-CM

## 2023-10-22 DIAGNOSIS — E1159 Type 2 diabetes mellitus with other circulatory complications: Secondary | ICD-10-CM

## 2023-10-22 DIAGNOSIS — E785 Hyperlipidemia, unspecified: Secondary | ICD-10-CM | POA: Diagnosis not present

## 2023-10-22 DIAGNOSIS — R972 Elevated prostate specific antigen [PSA]: Secondary | ICD-10-CM

## 2023-10-22 LAB — LIPID PANEL
Cholesterol: 193 mg/dL (ref 0–200)
HDL: 67.5 mg/dL (ref >39.00)
LDL Cholesterol: 119 mg/dL — ABNORMAL HIGH (ref 0–99)
NonHDL: 125.9
Total CHOL/HDL Ratio: 3
Triglycerides: 37 mg/dL (ref 0.0–149.0)
VLDL: 7.4 mg/dL (ref 0.0–40.0)

## 2023-10-22 LAB — CBC WITH DIFFERENTIAL/PLATELET
Basophils Absolute: 0 10*3/uL (ref 0.0–0.1)
Basophils Relative: 0.4 % (ref 0.0–3.0)
Eosinophils Absolute: 0.1 10*3/uL (ref 0.0–0.7)
Eosinophils Relative: 2.3 % (ref 0.0–5.0)
HCT: 35.8 % — ABNORMAL LOW (ref 39.0–52.0)
Hemoglobin: 12.1 g/dL — ABNORMAL LOW (ref 13.0–17.0)
Lymphocytes Relative: 23.5 % (ref 12.0–46.0)
Lymphs Abs: 0.8 10*3/uL (ref 0.7–4.0)
MCHC: 33.9 g/dL (ref 30.0–36.0)
MCV: 93.8 fl (ref 78.0–100.0)
Monocytes Absolute: 0.4 10*3/uL (ref 0.1–1.0)
Monocytes Relative: 12.1 % — ABNORMAL HIGH (ref 3.0–12.0)
Neutro Abs: 2.1 10*3/uL (ref 1.4–7.7)
Neutrophils Relative %: 61.7 % (ref 43.0–77.0)
Platelets: 226 10*3/uL (ref 150.0–400.0)
RBC: 3.82 Mil/uL — ABNORMAL LOW (ref 4.22–5.81)
RDW: 14 % (ref 11.5–15.5)
WBC: 3.4 10*3/uL — ABNORMAL LOW (ref 4.0–10.5)

## 2023-10-22 LAB — COMPREHENSIVE METABOLIC PANEL WITH GFR
ALT: 10 U/L (ref 0–53)
AST: 12 U/L (ref 0–37)
Albumin: 4.2 g/dL (ref 3.5–5.2)
Alkaline Phosphatase: 94 U/L (ref 39–117)
BUN: 29 mg/dL — ABNORMAL HIGH (ref 6–23)
CO2: 27 meq/L (ref 19–32)
Calcium: 9.5 mg/dL (ref 8.4–10.5)
Chloride: 101 meq/L (ref 96–112)
Creatinine, Ser: 1.01 mg/dL (ref 0.40–1.50)
GFR: 74.04 mL/min (ref >60.00)
Glucose, Bld: 116 mg/dL — ABNORMAL HIGH (ref 70–99)
Potassium: 4.9 meq/L (ref 3.5–5.1)
Sodium: 136 meq/L (ref 135–145)
Total Bilirubin: 0.4 mg/dL (ref 0.2–1.2)
Total Protein: 6.3 g/dL (ref 6.0–8.3)

## 2023-10-22 LAB — URINALYSIS
Bilirubin Urine: NEGATIVE
Hgb urine dipstick: NEGATIVE
Ketones, ur: NEGATIVE
Leukocytes,Ua: NEGATIVE
Nitrite: NEGATIVE
Specific Gravity, Urine: 1.02 (ref 1.000–1.030)
Total Protein, Urine: NEGATIVE
Urine Glucose: NEGATIVE
Urobilinogen, UA: 0.2 (ref 0.0–1.0)
pH: 6 (ref 5.0–8.0)

## 2023-10-22 LAB — PSA: PSA: 1.06 ng/mL (ref 0.10–4.00)

## 2023-10-22 LAB — VITAMIN B12: Vitamin B-12: 335 pg/mL (ref 211–911)

## 2023-10-22 LAB — HEMOGLOBIN A1C: Hgb A1c MFr Bld: 6 % (ref 4.6–6.5)

## 2023-10-25 DIAGNOSIS — F411 Generalized anxiety disorder: Secondary | ICD-10-CM | POA: Diagnosis not present

## 2023-10-26 ENCOUNTER — Encounter: Payer: Self-pay | Admitting: Internal Medicine

## 2023-10-26 ENCOUNTER — Ambulatory Visit (INDEPENDENT_AMBULATORY_CARE_PROVIDER_SITE_OTHER): Admitting: Internal Medicine

## 2023-10-26 VITALS — BP 120/70 | HR 65 | Temp 98.6°F | Ht 67.0 in | Wt 168.0 lb

## 2023-10-26 DIAGNOSIS — R5383 Other fatigue: Secondary | ICD-10-CM

## 2023-10-26 DIAGNOSIS — Z7984 Long term (current) use of oral hypoglycemic drugs: Secondary | ICD-10-CM

## 2023-10-26 DIAGNOSIS — F419 Anxiety disorder, unspecified: Secondary | ICD-10-CM

## 2023-10-26 DIAGNOSIS — E1159 Type 2 diabetes mellitus with other circulatory complications: Secondary | ICD-10-CM

## 2023-10-26 DIAGNOSIS — F339 Major depressive disorder, recurrent, unspecified: Secondary | ICD-10-CM | POA: Diagnosis not present

## 2023-10-26 DIAGNOSIS — R7989 Other specified abnormal findings of blood chemistry: Secondary | ICD-10-CM | POA: Insufficient documentation

## 2023-10-26 MED ORDER — ROSUVASTATIN CALCIUM 20 MG PO TABS: 20.0000 mg | ORAL_TABLET | ORAL | 4 refills | Status: AC

## 2023-10-26 NOTE — Assessment & Plan Note (Signed)
Check A1c On Metformin Well-controlled

## 2023-10-26 NOTE — Progress Notes (Signed)
 Subjective:  Patient ID: Jared Alvarez, male    DOB: 06-Apr-1951  Age: 73 y.o. MRN: [id number]  CC: Medical Management of Chronic Issues (6 mnth f/u, Pt has some concerns about dizzy spells, discuss meds and labs)   HPI Jared Alvarez presents for abn CBC, older brother died w/CVA F/u on CAD, HTN  Outpatient Medications Prior to Visit  Medication Sig Dispense Refill   acetaminophen  (TYLENOL ) 500 MG tablet Take 1 tablet (500 mg total) by mouth every 6 (six) hours as needed. 30 tablet 2   alfuzosin (UROXATRAL) 10 MG 24 hr tablet Take 10 mg by mouth daily.     aspirin  81 MG tablet Take 1 tablet (81 mg total) by mouth daily. 30 tablet 2   ciclopirox  (PENLAC ) 8 % solution APPLY TOPICALLY AT BEDTIME OVER NAIL AND SURROUNDING SKIN. APPLY DAILY OVER PREVIOUS COAT. AFTER SEVEN 7 DAYS, MAY REMOVE WITH ALCOHOL AND CONTINUE CYCLE. 6.6 mL 0   colchicine  0.6 MG tablet Take 1 tablet (0.6 mg total) by mouth 2 (two) times daily. If patient has flare up with gout 30 tablet 5   Cyanocobalamin  (VITAMIN B-12) 500 MCG SUBL Place 1 tablet (500 mcg total) under the tongue daily. 150 tablet 2   FLUoxetine  (PROZAC ) 40 MG capsule TAKE 1 CAPSULE (40 MG TOTAL) BY MOUTH DAILY. 90 capsule 1   Homeopathic Products (ARNICARE ARNICA) OINT Apply 1 application topically 2 (two) times daily as needed (arthritis).     ibuprofen (ADVIL) 200 MG tablet Ibuprofen cream PRN     IBUPROFEN-LINIMENT CO by Combination route.     LORazepam  (ATIVAN ) 1 MG tablet Take 0.5-1 tablets (0.5-1 mg total) by mouth 2 (two) times daily as needed for anxiety. 60 tablet 3   losartan  (COZAAR ) 100 MG tablet TAKE 1 TABLET BY MOUTH EVERY DAY 90 tablet 3   metFORMIN  (GLUCOPHAGE ) 1000 MG tablet TAKE ONE TABLET BY MOUTH TWICE A DAY WITH A MEAL 180 tablet 3   pantoprazole  (PROTONIX ) 40 MG tablet TAKE 1 TABLET BY MOUTH TWICE A DAY 180 tablet 3   tadalafil  (CIALIS ) 5 MG tablet Take 1 tablet by mouth once daily 90 tablet 0   triamcinolone  (NASACORT ) 55  MCG/ACT AERO nasal inhaler Place 2 sprays into the nose daily. 1 each 12   triamcinolone  cream (KENALOG ) 0.1 % Apply 1 application topically 2 (two) times daily. 100 g 0   rosuvastatin  (CRESTOR ) 20 MG tablet TAKE 1 TABLET (20 MG TOTAL) BY MOUTH 3 (THREE) TIMES A WEEK. MON, WED, AND FRI 36 tablet 4   Efinaconazole  (JUBLIA ) 10 % SOLN Apply 1 application  topically every morning. (Patient not taking: Reported on 10/26/2023) 4 mL 2   HYDROcodone  bit-homatropine (HYCODAN) 5-1.5 MG/5ML syrup Take 5 mLs by mouth every 8 (eight) hours as needed for cough. (Patient not taking: Reported on 10/26/2023) 240 mL 0   No facility-administered medications prior to visit.    ROS: Review of Systems  Constitutional:  Negative for appetite change, fatigue and unexpected weight change.  HENT:  Negative for congestion, nosebleeds, sneezing, sore throat and trouble swallowing.   Eyes:  Negative for itching and visual disturbance.  Respiratory:  Negative for cough.   Cardiovascular:  Negative for chest pain, palpitations and leg swelling.  Gastrointestinal:  Negative for abdominal distention, blood in stool, diarrhea and nausea.  Genitourinary:  Negative for frequency and hematuria.  Musculoskeletal:  Negative for back pain, gait problem, joint swelling and neck pain.  Skin:  Negative for rash.  Neurological:  Negative for dizziness, tremors, speech difficulty and weakness.  Psychiatric/Behavioral:  Negative for agitation, dysphoric mood, sleep disturbance and suicidal ideas. The patient is not nervous/anxious.     Objective:  BP 120/70   Pulse 65   Temp 98.6 F (37 C) (Oral)   Ht 5' 7 (1.702 m)   Wt 168 lb (76.2 kg)   SpO2 97%   BMI 26.31 kg/m   BP Readings from Last 3 Encounters:  10/26/23 120/70  08/18/23 118/62  04/06/23 120/82    Wt Readings from Last 3 Encounters:  10/26/23 168 lb (76.2 kg)  08/18/23 170 lb (77.1 kg)  04/06/23 175 lb (79.4 kg)    Physical Exam Constitutional:       General: He is not in acute distress.    Appearance: He is well-developed. He is obese.     Comments: NAD   Eyes:     Conjunctiva/sclera: Conjunctivae normal.     Pupils: Pupils are equal, round, and reactive to light.   Neck:     Thyroid : No thyromegaly.     Vascular: No JVD.   Cardiovascular:     Rate and Rhythm: Normal rate and regular rhythm.     Heart sounds: Normal heart sounds. No murmur heard.    No friction rub. No gallop.  Pulmonary:     Effort: Pulmonary effort is normal. No respiratory distress.     Breath sounds: Normal breath sounds. No wheezing or rales.  Chest:     Chest wall: No tenderness.  Abdominal:     General: Bowel sounds are normal. There is no distension.     Palpations: Abdomen is soft. There is no mass.     Tenderness: There is no abdominal tenderness. There is no guarding or rebound.   Musculoskeletal:        General: No tenderness. Normal range of motion.     Cervical back: Normal range of motion.  Lymphadenopathy:     Cervical: No cervical adenopathy.   Skin:    General: Skin is warm and dry.     Findings: No rash.   Neurological:     Mental Status: He is alert and oriented to person, place, and time.     Cranial Nerves: No cranial nerve deficit.     Motor: No abnormal muscle tone.     Coordination: Coordination normal.     Gait: Gait normal.     Deep Tendon Reflexes: Reflexes are normal and symmetric.   Psychiatric:        Behavior: Behavior normal.        Thought Content: Thought content normal.        Judgment: Judgment normal.     Lab Results  Component Value Date   WBC 3.4 (L) 10/22/2023   HGB 12.1 (L) 10/22/2023   HCT 35.8 (L) 10/22/2023   PLT 226.0 10/22/2023   GLUCOSE 116 (H) 10/22/2023   CHOL 193 10/22/2023   TRIG 37.0 10/22/2023   HDL 67.50 10/22/2023   LDLDIRECT 149.6 12/15/2011   LDLCALC 119 (H) 10/22/2023   ALT 10 10/22/2023   AST 12 10/22/2023   NA 136 10/22/2023   K 4.9 10/22/2023   CL 101 10/22/2023    CREATININE 1.01 10/22/2023   BUN 29 (H) 10/22/2023   CO2 27 10/22/2023   TSH 1.61 06/11/2022   PSA 1.06 10/22/2023   INR 0.98 06/03/2010   HGBA1C 6.0 10/22/2023   MICROALBUR 1.3 10/15/2020    CT ANGIO CHEST AORTA W/ &  OR WO/CM & GATING ([place] ONLY) Result Date: 05/26/2023 CLINICAL DATA:  Thoracic aortic aneurysm EXAM: CT ANGIOGRAPHY CHEST WITH CONTRAST TECHNIQUE: Multidetector CT imaging of the chest was performed using the standard protocol during bolus administration of intravenous contrast. Multiplanar CT image reconstructions and MIPs were obtained to evaluate the vascular anatomy. RADIATION DOSE REDUCTION: This exam was performed according to the departmental dose-optimization program which includes automated exposure control, adjustment of the mA and/or kV according to patient size and/or use of iterative reconstruction technique. CONTRAST:  75mL OMNIPAQUE  IOHEXOL  350 MG/ML SOLN COMPARISON:  Prior CT scan of the chest 12/16/2017 FINDINGS: Cardiovascular: Two vessel aortic arch anatomy. The right brachiocephalic and left common carotid arteries share a common origin. Atherosclerotic calcifications present throughout the transverse aorta. The aortic root is normal in caliber measuring 3.7 cm at the sinuses of Valsalva. No effacement of the sino-tubular junction. The ascending thoracic aorta is normal in caliber at 3.8 cm. The arch and descending thoracic aorta are normal in caliber. No evidence of dissection. Calcifications present throughout the coronary arteries. The heart is normal in size. No pericardial effusion. Mediastinum/Nodes: Unremarkable CT appearance of the thyroid  gland. No suspicious mediastinal or hilar adenopathy. No soft tissue mediastinal mass. The thoracic esophagus is unremarkable. Lungs/Pleura: Lungs are clear. No pleural effusion or pneumothorax. Trace dependent atelectasis. No suspicious pulmonary nodule or mass. Upper Abdomen: No acute abnormality. Musculoskeletal: No  chest wall abnormality. No acute or significant osseous findings. Review of the MIP images confirms the above findings. IMPRESSION: 1. No evidence of thoracic aortic aneurysm or dissection. The prior measurement of 4.2 cm in the ascending thoracic aorta from the 2019 study was likely exaggerated due to blurring of the aortic margins due to cardiac motion artifact. The aorta is precisely measured on today's gated study at a maximum of 3.8 cm which is nonaneurysmal. 2. Coronary artery atherosclerotic calcifications. 3. Aortic atherosclerosis. Aortic Atherosclerosis (ICD10-I70.0). Electronically Signed   By: Wilkie Lent M.D.   On: 05/26/2023 10:39    Assessment & Plan:   Problem List Items Addressed This Visit     DM type 2 causing vascular disease (HCC)   Check A1c On Metformin  Well-controlled      Relevant Medications   rosuvastatin  (CRESTOR ) 20 MG tablet (Start on 10/27/2023)   Anxiety disorder   Charlena is taking Lorazepam  at hs prn      Fatigue   Chronic Labs were reviewed Rest more      Depression   Wife has cancer Cont w/ Fluoxetine        Abnormal CBC - Primary   Chronic - unknown etiology 2021 [facility]  - bone marrow bx was offered and declined Hematology ref - he is interested in bone marrow bx now      Relevant Orders   Ambulatory referral to Hematology / Oncology      Meds ordered this encounter  Medications   rosuvastatin  (CRESTOR ) 20 MG tablet    Sig: Take 1 tablet (20 mg total) by mouth 3 (three) times a week. Mon, Wed, and Fri    Dispense:  36 tablet    Refill:  4      Follow-up: Return in about 6 months (around 04/26/2024) for a follow-up visit.  Marolyn Noel, MD

## 2023-10-26 NOTE — Assessment & Plan Note (Signed)
 Jared Alvarez is taking Lorazepam at hs prn

## 2023-10-26 NOTE — Assessment & Plan Note (Signed)
 Wife has cancer Cont w/ Fluoxetine 

## 2023-10-26 NOTE — Assessment & Plan Note (Signed)
 Chronic Labs were reviewed Rest more

## 2023-10-26 NOTE — Assessment & Plan Note (Addendum)
 Chronic - unknown etiology 2021 [facility]  - bone marrow bx was offered and declined Hematology ref - he is interested in bone marrow bx now

## 2023-11-11 ENCOUNTER — Telehealth: Payer: Self-pay | Admitting: Physician Assistant

## 2023-11-11 DIAGNOSIS — F411 Generalized anxiety disorder: Secondary | ICD-10-CM | POA: Diagnosis not present

## 2023-11-17 ENCOUNTER — Inpatient Hospital Stay

## 2023-11-17 ENCOUNTER — Inpatient Hospital Stay: Admitting: Physician Assistant

## 2023-11-22 DIAGNOSIS — F411 Generalized anxiety disorder: Secondary | ICD-10-CM | POA: Diagnosis not present

## 2023-11-24 ENCOUNTER — Emergency Department (HOSPITAL_COMMUNITY)

## 2023-11-24 ENCOUNTER — Other Ambulatory Visit: Payer: Self-pay

## 2023-11-24 ENCOUNTER — Observation Stay (HOSPITAL_COMMUNITY)
Admission: EM | Admit: 2023-11-24 | Discharge: 2023-11-25 | Disposition: A | Discharge status: Home / Self Care | Attending: Internal Medicine | Admitting: Internal Medicine

## 2023-11-24 ENCOUNTER — Encounter (HOSPITAL_COMMUNITY): Payer: Self-pay

## 2023-11-24 DIAGNOSIS — I480 Paroxysmal atrial fibrillation: Secondary | ICD-10-CM | POA: Diagnosis not present

## 2023-11-24 DIAGNOSIS — Z7984 Long term (current) use of oral hypoglycemic drugs: Secondary | ICD-10-CM | POA: Diagnosis not present

## 2023-11-24 DIAGNOSIS — E119 Type 2 diabetes mellitus without complications: Secondary | ICD-10-CM | POA: Diagnosis not present

## 2023-11-24 DIAGNOSIS — R55 Syncope and collapse: Principal | ICD-10-CM | POA: Insufficient documentation

## 2023-11-24 DIAGNOSIS — S42022A Displaced fracture of shaft of left clavicle, initial encounter for closed fracture: Secondary | ICD-10-CM | POA: Insufficient documentation

## 2023-11-24 DIAGNOSIS — R519 Headache, unspecified: Secondary | ICD-10-CM | POA: Diagnosis present

## 2023-11-24 DIAGNOSIS — W19XXXA Unspecified fall, initial encounter: Secondary | ICD-10-CM | POA: Diagnosis not present

## 2023-11-24 DIAGNOSIS — M1712 Unilateral primary osteoarthritis, left knee: Secondary | ICD-10-CM | POA: Diagnosis not present

## 2023-11-24 DIAGNOSIS — E785 Hyperlipidemia, unspecified: Secondary | ICD-10-CM | POA: Insufficient documentation

## 2023-11-24 DIAGNOSIS — M19012 Primary osteoarthritis, left shoulder: Secondary | ICD-10-CM | POA: Diagnosis not present

## 2023-11-24 DIAGNOSIS — N179 Acute kidney failure, unspecified: Secondary | ICD-10-CM | POA: Insufficient documentation

## 2023-11-24 DIAGNOSIS — F1090 Alcohol use, unspecified, uncomplicated: Secondary | ICD-10-CM | POA: Insufficient documentation

## 2023-11-24 DIAGNOSIS — Z7901 Long term (current) use of anticoagulants: Secondary | ICD-10-CM | POA: Diagnosis not present

## 2023-11-24 DIAGNOSIS — M47811 Spondylosis without myelopathy or radiculopathy, occipito-atlanto-axial region: Secondary | ICD-10-CM | POA: Diagnosis not present

## 2023-11-24 DIAGNOSIS — D638 Anemia in other chronic diseases classified elsewhere: Secondary | ICD-10-CM | POA: Insufficient documentation

## 2023-11-24 DIAGNOSIS — M47812 Spondylosis without myelopathy or radiculopathy, cervical region: Secondary | ICD-10-CM | POA: Diagnosis not present

## 2023-11-24 DIAGNOSIS — Z955 Presence of coronary angioplasty implant and graft: Secondary | ICD-10-CM | POA: Diagnosis not present

## 2023-11-24 DIAGNOSIS — Z043 Encounter for examination and observation following other accident: Secondary | ICD-10-CM | POA: Diagnosis not present

## 2023-11-24 DIAGNOSIS — M50221 Other cervical disc displacement at C4-C5 level: Secondary | ICD-10-CM | POA: Diagnosis not present

## 2023-11-24 DIAGNOSIS — T07XXXA Unspecified multiple injuries, initial encounter: Secondary | ICD-10-CM

## 2023-11-24 DIAGNOSIS — I2510 Atherosclerotic heart disease of native coronary artery without angina pectoris: Secondary | ICD-10-CM | POA: Insufficient documentation

## 2023-11-24 DIAGNOSIS — Z794 Long term (current) use of insulin: Secondary | ICD-10-CM | POA: Diagnosis not present

## 2023-11-24 DIAGNOSIS — Z79899 Other long term (current) drug therapy: Secondary | ICD-10-CM | POA: Diagnosis not present

## 2023-11-24 DIAGNOSIS — I4891 Unspecified atrial fibrillation: Secondary | ICD-10-CM | POA: Diagnosis present

## 2023-11-24 DIAGNOSIS — Z7982 Long term (current) use of aspirin: Secondary | ICD-10-CM | POA: Insufficient documentation

## 2023-11-24 DIAGNOSIS — Z8679 Personal history of other diseases of the circulatory system: Secondary | ICD-10-CM | POA: Diagnosis not present

## 2023-11-24 DIAGNOSIS — T148XXA Other injury of unspecified body region, initial encounter: Secondary | ICD-10-CM | POA: Insufficient documentation

## 2023-11-24 DIAGNOSIS — M11262 Other chondrocalcinosis, left knee: Secondary | ICD-10-CM | POA: Diagnosis not present

## 2023-11-24 DIAGNOSIS — S80212A Abrasion, left knee, initial encounter: Secondary | ICD-10-CM | POA: Diagnosis not present

## 2023-11-24 DIAGNOSIS — Z23 Encounter for immunization: Secondary | ICD-10-CM | POA: Diagnosis not present

## 2023-11-24 DIAGNOSIS — S42032A Displaced fracture of lateral end of left clavicle, initial encounter for closed fracture: Secondary | ICD-10-CM | POA: Diagnosis not present

## 2023-11-24 DIAGNOSIS — S42002A Fracture of unspecified part of left clavicle, initial encounter for closed fracture: Secondary | ICD-10-CM | POA: Diagnosis present

## 2023-11-24 DIAGNOSIS — M79622 Pain in left upper arm: Secondary | ICD-10-CM | POA: Diagnosis present

## 2023-11-24 DIAGNOSIS — R0902 Hypoxemia: Secondary | ICD-10-CM | POA: Diagnosis not present

## 2023-11-24 LAB — BASIC METABOLIC PANEL WITH GFR
Anion gap: 11 (ref 5–15)
BUN: 28 mg/dL — ABNORMAL HIGH (ref 8–23)
CO2: 19 mmol/L — ABNORMAL LOW (ref 22–32)
Calcium: 8.9 mg/dL (ref 8.9–10.3)
Chloride: 107 mmol/L (ref 98–111)
Creatinine, Ser: 1.3 mg/dL — ABNORMAL HIGH (ref 0.61–1.24)
GFR, Estimated: 58 mL/min — ABNORMAL LOW (ref >60)
Glucose, Bld: 150 mg/dL — ABNORMAL HIGH (ref 70–99)
Potassium: 3.5 mmol/L (ref 3.5–5.1)
Sodium: 137 mmol/L (ref 135–145)

## 2023-11-24 LAB — CBC WITH DIFFERENTIAL/PLATELET
Abs Immature Granulocytes: 0.01 K/uL (ref 0.00–0.07)
Basophils Absolute: 0 K/uL (ref 0.0–0.1)
Basophils Relative: 0 %
Eosinophils Absolute: 0.1 K/uL (ref 0.0–0.5)
Eosinophils Relative: 1 %
HCT: 32.8 % — ABNORMAL LOW (ref 39.0–52.0)
Hemoglobin: 11 g/dL — ABNORMAL LOW (ref 13.0–17.0)
Immature Granulocytes: 0 %
Lymphocytes Relative: 17 %
Lymphs Abs: 0.9 K/uL (ref 0.7–4.0)
MCH: 32.1 pg (ref 26.0–34.0)
MCHC: 33.5 g/dL (ref 30.0–36.0)
MCV: 95.6 fL (ref 80.0–100.0)
Monocytes Absolute: 0.5 K/uL (ref 0.1–1.0)
Monocytes Relative: 11 %
Neutro Abs: 3.4 K/uL (ref 1.7–7.7)
Neutrophils Relative %: 71 %
Platelets: 227 K/uL (ref 150–400)
RBC: 3.43 MIL/uL — ABNORMAL LOW (ref 4.22–5.81)
RDW: 13.2 % (ref 11.5–15.5)
WBC: 4.9 K/uL (ref 4.0–10.5)
nRBC: 0 % (ref 0.0–0.2)

## 2023-11-24 LAB — PROTIME-INR
INR: 1 (ref 0.8–1.2)
Prothrombin Time: 13.9 s (ref 11.4–15.2)

## 2023-11-24 LAB — CBG MONITORING, ED: Glucose-Capillary: 126 mg/dL — ABNORMAL HIGH (ref 70–99)

## 2023-11-24 MED ORDER — ACETAMINOPHEN 500 MG PO TABS
1000.0000 mg | ORAL_TABLET | Freq: Once | ORAL | Status: AC
  Administered 2023-11-24: 1000 mg via ORAL
  Filled 2023-11-24: qty 2

## 2023-11-24 MED ORDER — SODIUM CHLORIDE 0.9 % IV BOLUS
1000.0000 mL | Freq: Once | INTRAVENOUS | Status: AC
  Administered 2023-11-25: 1000 mL via INTRAVENOUS

## 2023-11-24 MED ORDER — TETANUS-DIPHTH-ACELL PERTUSSIS 5-2.5-18.5 LF-MCG/0.5 IM SUSY
0.5000 mL | PREFILLED_SYRINGE | Freq: Once | INTRAMUSCULAR | Status: AC
  Administered 2023-11-24: 0.5 mL via INTRAMUSCULAR
  Filled 2023-11-24: qty 0.5

## 2023-11-24 NOTE — Progress Notes (Signed)
 Orthopedic Tech Progress Note Patient Details:  Jared Alvarez 27-May-1950 [id number]  Ortho Devices Type of Ortho Device: Sling immobilizer Ortho Device/Splint Location: LUE Ortho Device/Splint Interventions: Ordered, Application   Post Interventions Patient Tolerated: Well Instructions Provided: Care of device   [name] [name] 11/24/2023, 11:05 PM

## 2023-11-24 NOTE — ED Triage Notes (Addendum)
 [place] the bar [place], PT is a base player in the band and noticed to have a 5-10 second spell of syncope per [facility]. PT brought to ER by EMS and given bolus en route. PT has a history of syncope per PT. PT A&OX4 and VSS. PT states LOC and left mid arm pain from the fall

## 2023-11-24 NOTE — ED Provider Notes (Signed)
 [place] [facility]ERGENC[facility] DEPARTMENT AT [facility] Provider Note   CSN: [id number] Arrival date & time: 11/24/23  2135     Patient presents with: Near Syncope   Jared Alvarez is a 73 y.o. male.  {Add pertinent medical, surgical, social history, OB history to YEP:67052} The history is provided by the patient, medical records and the EMS personnel. No language interpreter was used.  Near Syncope     73 year old male history of CAD status post drug-eluting stent, hypertension, HLD, DM, brought here via EMS for evaluation of syncope.  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  Patient states he was playing the last song and doing the process he felt lightheadedness and subsequently found himself on the ground with people surrounding him.  He was able to get up on his own.  He denies any significant confusion no tongue biting no urinary incontinence.  He endorsed a mild headache and mild tenderness to his left upper arm.  He denies any associated chest pain or trouble breathing no abdominal pain no back pain no urinary symptoms.  Denies any new numbness or new weakness.  States he felt fine prior to the event and he has been eating and drinking fine.  He mentioned he has had a few syncopal episodes in the past which was thought to be vagal episodes.  He is unsure his last tetanus status.  No recent medication changes.  Prior to Admission medications   Medication Sig Start Date End Date Taking? Authorizing Provider  acetaminophen  (TYLENOL ) 500 MG tablet Take 1 tablet (500 mg total) by mouth every 6 (six) hours as needed. 07/25/21   Plotnikov, Aleksei V, MD  alfuzosin (UROXATRAL) 10 MG 24 hr tablet Take 10 mg by mouth daily. 10/01/22   [provider]  aspirin  81 MG tablet Take 1 tablet (81 mg total) by mouth daily. 06/23/17   Plotnikov, Karlynn GAILS, MD  ciclopirox  (PENLAC ) 8 % solution APPLY  TOPICALLY AT BEDTIME OVER NAIL AND SURROUNDING SKIN. APPLY DAILY OVER PREVIOUS COAT. AFTER SEVEN 7 DAYS, MAY REMOVE WITH ALCOHOL AND CONTINUE CYCLE. 08/04/23   Plotnikov, Aleksei V, MD  colchicine  0.6 MG tablet Take 1 tablet (0.6 mg total) by mouth 2 (two) times daily. If patient has flare up with gout 10/28/20   Corey, Evan S, MD  Cyanocobalamin  (VITAMIN B-12) 500 MCG SUBL Place 1 tablet (500 mcg total) under the tongue daily. 06/23/17   Plotnikov, Aleksei V, MD  Efinaconazole  (JUBLIA ) 10 % SOLN Apply 1 application  topically every morning. Patient not taking: Reported on 10/26/2023 10/07/22   Plotnikov, Karlynn GAILS, MD  FLUoxetine  (PROZAC ) 40 MG capsule TAKE 1 CAPSULE (40 MG TOTAL) BY MOUTH DAILY. 07/14/23   Plotnikov, Aleksei V, MD  Homeopathic Products (ARNICARE ARNICA) OINT Apply 1 application topically 2 (two) times daily as needed (arthritis).    [provider]  HYDROcodone  bit-homatropine (HYCODAN) 5-1.5 MG/5ML syrup Take 5 mLs by mouth every 8 (eight) hours as needed for cough. Patient not taking: Reported on 10/26/2023 04/30/23   Plotnikov, Aleksei V, MD  ibuprofen (ADVIL) 200 MG tablet Ibuprofen cream PRN    [provider]  IBUPROFEN-LINIMENT CO by Combination route.    [provider]  LORazepam  (ATIVAN ) 1 MG tablet Take 0.5-1 tablets (0.5-1 mg total) by mouth 2 (two) times daily as needed for anxiety. 03/16/23   Plotnikov, Karlynn GAILS, MD  losartan  (COZAAR ) 100 MG tablet TAKE 1 TABLET BY MOUTH EVERY DAY 04/13/23   Plotnikov, Aleksei V, MD  metFORMIN  (GLUCOPHAGE ) 1000 MG tablet TAKE ONE TABLET BY MOUTH TWICE A DAY WITH A MEAL 07/14/23   Plotnikov, Aleksei V, MD  pantoprazole  (PROTONIX ) 40 MG tablet TAKE 1 TABLET BY MOUTH TWICE A DAY 10/11/23   Plotnikov, Aleksei V, MD  rosuvastatin  (CRESTOR ) 20 MG tablet Take 1 tablet (20 mg total) by mouth 3 (three) times a week. Mon, Wed, and Fri 10/27/23   Plotnikov, Karlynn GAILS, MD  tadalafil  (CIALIS ) 5 MG tablet Take 1 tablet by mouth once  daily 09/03/23   Plotnikov, Aleksei V, MD  triamcinolone  (NASACORT ) 55 MCG/ACT AERO nasal inhaler Place 2 sprays into the nose daily. 01/30/21   Norleen Lynwood ORN, MD  triamcinolone  cream (KENALOG ) 0.1 % Apply 1 application topically 2 (two) times daily. 04/08/21   Rollene Almarie LABOR, MD    Allergies: Atorvastatin, Bupropion hcl, Doxazosin , Duloxetine, Fluticasone, Nexium  [esomeprazole  magnesium ], Simvastatin, Venlafaxine, Viibryd  [vilazodone  hcl], Vilazodone , Ketorolac , and Tamsulosin     Review of Systems  Cardiovascular:  Positive for near-syncope.  All other systems reviewed and are negative.   Updated Vital Signs BP 108/64 (BP Location: Right Arm)   Pulse 70   Temp 97.7 F (36.5 C) (Oral)   Resp 16   Ht 5' 8 (1.727 m)   Wt 75.3 kg   SpO2 100%   BMI 25.24 kg/m   Physical Exam Constitutional:      General: He is not in acute distress.    Appearance: He is well-developed.  HENT:     Head: Normocephalic.     Comments: Very minimal skin tear noted to the lateral aspect of the right eyebrow without significant tenderness.  No midface tenderness no Battle sign no raccoon eyes Eyes:     Extraocular Movements: Extraocular movements intact.     Conjunctiva/sclera: Conjunctivae normal.     Pupils: Pupils are equal, round, and reactive to light.  Cardiovascular:     Rate and Rhythm: Rhythm irregular.     Pulses: Normal pulses.     Heart sounds: Normal heart sounds.  Pulmonary:     Effort: Pulmonary effort is normal.     Breath sounds: Normal breath sounds. No wheezing, rhonchi or rales.  Abdominal:     Palpations: Abdomen is soft.     Tenderness: There is no abdominal tenderness.  Musculoskeletal:        General: Tenderness (Left arm: Mild tenderness to proximal humeral region without any bruising.  Left shoulder with full range of motion.) present.     Cervical back: Normal range of motion and neck supple. No rigidity or tenderness.  Skin:    Findings: No rash.     Comments:  Small skin tear noted to left anterior knee with normal knee flexion and extension and no deformity noted.  Neurological:     Mental Status: He is alert and oriented to person, place, and time.     GCS: GCS eye subscore is 4. GCS verbal subscore is 5. GCS motor subscore is 6.     Cranial Nerves: Cranial nerves 2-12 are intact.     Sensory: Sensation is intact.     Motor: Motor function is intact.     Coordination: Coordination is intact.     (all labs ordered are listed, but only abnormal results are displayed) Labs Reviewed  CBC WITH DIFFERENTIAL/PLATELET - Abnormal; Notable for the following components:  Result Value   RBC 3.43 (*)    Hemoglobin 11.0 (*)    HCT 32.8 (*)    All other components within normal limits  BASIC METABOLIC PANEL WITH GFR - Abnormal; Notable for the following components:   CO2 19 (*)    Glucose, Bld 150 (*)    BUN 28 (*)    Creatinine, Ser 1.30 (*)    GFR, Estimated 58 (*)    All other components within normal limits  CBG MONITORING, ED - Abnormal; Notable for the following components:   Glucose-Capillary 126 (*)    All other components within normal limits  PROTIME-INR    EKG: EKG Interpretation Date/Time:  Wednesday November 24 2023 21:57:19 EDT Ventricular Rate:  73 PR Interval:    QRS Duration:  104 QT Interval:  385 QTC Calculation: 425 R Axis:   51  Text Interpretation: Atrial fibrillation Borderline T wave abnormalities Confirmed by Lorette Mayo [phone]) on 11/24/2023 11:24:09 PM  Radiology: CT Head Wo Contrast Result Date: 11/24/2023 CLINICAL DATA:  Syncope EXAM: CT HEAD WITHOUT CONTRAST CT CERVICAL SPINE WITHOUT CONTRAST TECHNIQUE: Multidetector CT imaging of the head and cervical spine was performed following the standard protocol without intravenous contrast. Multiplanar CT image reconstructions of the cervical spine were also generated. RADIATION DOSE REDUCTION: This exam was performed according to the departmental dose-optimization  program which includes automated exposure control, adjustment of the mA and/or kV according to patient size and/or use of iterative reconstruction technique. COMPARISON:  None Available. FINDINGS: CT HEAD FINDINGS Brain: No acute territorial infarction, hemorrhage, or intracranial mass. Mild atrophy. The ventricles are nonenlarged Vascular: No hyperdense vessels.  Carotid vascular calcification Skull: Normal. Negative for fracture or focal lesion. Sinuses/Orbits: No acute finding. Other: None CT CERVICAL SPINE FINDINGS Alignment: Trace anterolisthesis C2 on C3. Trace retrolisthesis C4 on C5 and trace anterolisthesis C6 on C7. Facet alignment is normal Skull base and vertebrae: No acute fracture. No primary bone lesion or focal pathologic process. Soft tissues and spinal canal: No prevertebral fluid or swelling. No visible canal hematoma. Disc levels: Advanced disc space narrowing and degenerative change C3 through C7. Advanced C1-C2 degenerative change. Multilevel facet degenerative change with foraminal narrowing Upper chest: Negative. Other: None IMPRESSION: 1. No CT evidence for acute intracranial abnormality. Mild atrophy. 2. Degenerative changes of the cervical spine. No acute osseous abnormality. Electronically Signed   By: Luke Bun M.D.   On: 11/24/2023 22:58   CT Cervical Spine Wo Contrast Result Date: 11/24/2023 CLINICAL DATA:  Syncope EXAM: CT HEAD WITHOUT CONTRAST CT CERVICAL SPINE WITHOUT CONTRAST TECHNIQUE: Multidetector CT imaging of the head and cervical spine was performed following the standard protocol without intravenous contrast. Multiplanar CT image reconstructions of the cervical spine were also generated. RADIATION DOSE REDUCTION: This exam was performed according to the departmental dose-optimization program which includes automated exposure control, adjustment of the mA and/or kV according to patient size and/or use of iterative reconstruction technique. COMPARISON:  None Available.  FINDINGS: CT HEAD FINDINGS Brain: No acute territorial infarction, hemorrhage, or intracranial mass. Mild atrophy. The ventricles are nonenlarged Vascular: No hyperdense vessels.  Carotid vascular calcification Skull: Normal. Negative for fracture or focal lesion. Sinuses/Orbits: No acute finding. Other: None CT CERVICAL SPINE FINDINGS Alignment: Trace anterolisthesis C2 on C3. Trace retrolisthesis C4 on C5 and trace anterolisthesis C6 on C7. Facet alignment is normal Skull base and vertebrae: No acute fracture. No primary bone lesion or focal pathologic process. Soft tissues and spinal canal: No prevertebral fluid or  swelling. No visible canal hematoma. Disc levels: Advanced disc space narrowing and degenerative change C3 through C7. Advanced C1-C2 degenerative change. Multilevel facet degenerative change with foraminal narrowing Upper chest: Negative. Other: None IMPRESSION: 1. No CT evidence for acute intracranial abnormality. Mild atrophy. 2. Degenerative changes of the cervical spine. No acute osseous abnormality. Electronically Signed   By: Luke Bun M.D.   On: 11/24/2023 22:58   DG Humerus Left Result Date: 11/24/2023 CLINICAL DATA:  fall EXAM: LEFT HUMERUS - 2+ VIEW COMPARISON:  CTA chest 05/21/2023 FINDINGS: Acute inferiorly displaced distal clavicular fracture. Severe degenerative changes of the acromioclavicular joint. No acute displaced fracture or dislocation of the humerus. Elbow is grossly unremarkable. Shoulder otherwise unremarkable. Soft tissues are unremarkable. IMPRESSION: Acute inferiorly displaced left distal clavicular fracture. Electronically Signed   By: Morgane  Naveau M.D.   On: 11/24/2023 22:35   DG Knee Complete 4 Views Left Result Date: 11/24/2023 CLINICAL DATA:  fall EXAM: LEFT KNEE - COMPLETE 4+ VIEW COMPARISON:  X-ray left knee 02/17/2018 FINDINGS: No evidence of fracture, dislocation, or joint effusion. Mild tricompartmental degenerative changes of the knee with associated  chondrocalcinosis. No aggressive appearing focal osseous lesion. Soft tissues are unremarkable. Vascular calcifications. IMPRESSION: No acute displaced fracture or dislocation. Electronically Signed   By: Morgane  Naveau M.D.   On: 11/24/2023 22:31    {Document cardiac monitor, telemetry assessment procedure when appropriate:32947} Procedures   Medications Ordered in the ED - No data to display    {Click here for ABCD2, HEART and other calculators REFRESH Note before signing:1}      CHA2DS2-VASc Score: 4                        Medical Decision Making Amount and/or Complexity of Data Reviewed Labs: ordered. Radiology: ordered.  Risk OTC drugs. Prescription drug management.   BP 108/64 (BP Location: Right Arm)   Pulse 70   Temp 97.7 F (36.5 C) (Oral)   Resp 16   Ht 5' 8 (1.727 m)   Wt 75.3 kg   SpO2 100%   BMI 25.24 kg/m   10:45 PM Jared Alvarez is a 73 yo male presenting to the ED following a syncopal episodes. 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 He does not recall the exact manner in which he fell though his glasses were bent on the left side and he obtained a left lateral eyebrow abrasion, left knee abrasion, and mild pain in the left shoulder and head. Aside from dizziness immediately preceding the LOC, he denies experiencing any other symptoms today. He states he has maintained adequate hydration and nutritional intact but does report he consumed two shots of liquor approximately 7:30 this evening. He has experienced similar episodes, most recently approximately a month ago when he was working outside. He started feeling lightheaded and had to lay down to rest, which resolved his symptoms. He has previously had syncopal episodes including last year when he lost consciousness and EMS was called. After receiving IV fluids he felt better and declined further care or transport. He has a notable history of HTN and stent placement in 1997  as well as T2DM. He does not check his blood pressure or blood sugar at home. He is followed by Cardiology and denies any history of arrhythmias.   On exam this is a well-appearing elderly male resting comfortably appears to be in no acute discomfort.  Exam  notable for an small skin tear to his left orbital region near the eyebrow without significant tenderness.  No evidence of ocular entrapment.  Small skin tear/abrasion to left anterior knee and some tenderness to left upper arm.  Patient otherwise mentating appropriately, he does not exhibit any focal neurodeficit.  He does have irregular irregular heart rhythm and denies any prior history of atrial fibrillation.  His CHADS2 vascular score of 4 which puts him at increased risk of stroke event.  He would benefit from anticoagulation.  -Labs ordered, independently viewed and interpreted by me.  Labs remarkable for Cr 1.3 suggestive for mild AKI, IVF given.   -The patient was maintained on a cardiac monitor.  I personally viewed and interpreted the cardiac monitored which showed an underlying rhythm of: afib (new onset) -Imaging independently viewed and interpreted by me and I agree with radiologist's interpretation.  Result remarkable for head/cspine CT unremarkable.  Xray L humerus showing acute inferiorly displace left distal clavicular fx.  This is a closed injury.  Sling provided.  Interestingly pt does not have point tenderness to affected area.  His tenderness is more lateral deltoid.  His ROM is full -This patient presents to the ED for concern of syncope, this involves an extensive number of treatment options, and is a complaint that carries with it a high risk of complications and morbidity.  The differential diagnosis includes vasovagal syncope, cardiac arrhythmia, anemia, electrolytes imbalance, stroke, PE -Co morbidities that complicate the patient evaluation includes DM, HTN, CAD, AAA -Treatment includes tylenol , tdap, IVF -Reevaluation of  the patient after these medicines showed that the patient improved -PCP office notes or outside notes reviewed -Discussion with specialist *** -Escalation to admission/observation considered: patient agreeable with admission.   {Document critical care time when appropriate  Document review of labs and clinical decision tools ie CHADS2VASC2, etc  Document your independent review of radiology images and any outside records  Document your discussion with family members, caretakers and with consultants  Document social determinants of health affecting pt's care  Document your decision making why or why not admission, treatments were needed:32947:::1}   Final diagnoses:  None    ED Discharge Orders     None

## 2023-11-25 ENCOUNTER — Other Ambulatory Visit (HOSPITAL_COMMUNITY): Payer: Self-pay

## 2023-11-25 ENCOUNTER — Telehealth (HOSPITAL_COMMUNITY): Payer: Self-pay | Admitting: Pharmacy Technician

## 2023-11-25 ENCOUNTER — Observation Stay (HOSPITAL_COMMUNITY)

## 2023-11-25 ENCOUNTER — Inpatient Hospital Stay (HOSPITAL_BASED_OUTPATIENT_CLINIC_OR_DEPARTMENT_OTHER)
Admit: 2023-11-25 | Discharge: 2023-11-25 | Disposition: A | Discharge status: Home / Self Care | Attending: Cardiology | Admitting: Cardiology

## 2023-11-25 ENCOUNTER — Encounter (HOSPITAL_COMMUNITY): Payer: Self-pay | Admitting: Internal Medicine

## 2023-11-25 DIAGNOSIS — D638 Anemia in other chronic diseases classified elsewhere: Secondary | ICD-10-CM

## 2023-11-25 DIAGNOSIS — I4891 Unspecified atrial fibrillation: Secondary | ICD-10-CM | POA: Diagnosis not present

## 2023-11-25 DIAGNOSIS — I771 Stricture of artery: Secondary | ICD-10-CM | POA: Diagnosis not present

## 2023-11-25 DIAGNOSIS — I517 Cardiomegaly: Secondary | ICD-10-CM | POA: Diagnosis not present

## 2023-11-25 DIAGNOSIS — I2510 Atherosclerotic heart disease of native coronary artery without angina pectoris: Secondary | ICD-10-CM | POA: Diagnosis not present

## 2023-11-25 DIAGNOSIS — S42002A Fracture of unspecified part of left clavicle, initial encounter for closed fracture: Secondary | ICD-10-CM | POA: Diagnosis present

## 2023-11-25 DIAGNOSIS — R55 Syncope and collapse: Secondary | ICD-10-CM

## 2023-11-25 DIAGNOSIS — E782 Mixed hyperlipidemia: Secondary | ICD-10-CM | POA: Diagnosis not present

## 2023-11-25 DIAGNOSIS — N179 Acute kidney failure, unspecified: Secondary | ICD-10-CM | POA: Diagnosis not present

## 2023-11-25 DIAGNOSIS — Z8679 Personal history of other diseases of the circulatory system: Secondary | ICD-10-CM

## 2023-11-25 DIAGNOSIS — S42022A Displaced fracture of shaft of left clavicle, initial encounter for closed fracture: Secondary | ICD-10-CM | POA: Diagnosis not present

## 2023-11-25 DIAGNOSIS — I700 Atherosclerosis of aorta: Secondary | ICD-10-CM | POA: Diagnosis not present

## 2023-11-25 DIAGNOSIS — E119 Type 2 diabetes mellitus without complications: Secondary | ICD-10-CM | POA: Diagnosis not present

## 2023-11-25 LAB — ECHOCARDIOGRAM COMPLETE
AR max vel: 2.57 cm2
AV Peak grad: 10 mmHg
Ao pk vel: 1.59 m/s
Area-P 1/2: 3.48 cm2
Est EF: 55
Height: 68 in
S' Lateral: 3.6 cm
Weight: 2729.6 [oz_av]

## 2023-11-25 LAB — CBC WITH DIFFERENTIAL/PLATELET
Abs Immature Granulocytes: 0.01 K/uL (ref 0.00–0.07)
Basophils Absolute: 0 K/uL (ref 0.0–0.1)
Basophils Relative: 1 %
Eosinophils Absolute: 0.1 K/uL (ref 0.0–0.5)
Eosinophils Relative: 1 %
HCT: 28.4 % — ABNORMAL LOW (ref 39.0–52.0)
Hemoglobin: 9.8 g/dL — ABNORMAL LOW (ref 13.0–17.0)
Immature Granulocytes: 0 %
Lymphocytes Relative: 19 %
Lymphs Abs: 1.1 K/uL (ref 0.7–4.0)
MCH: 32.2 pg (ref 26.0–34.0)
MCHC: 34.5 g/dL (ref 30.0–36.0)
MCV: 93.4 fL (ref 80.0–100.0)
Monocytes Absolute: 0.6 K/uL (ref 0.1–1.0)
Monocytes Relative: 10 %
Neutro Abs: 3.8 K/uL (ref 1.7–7.7)
Neutrophils Relative %: 69 %
Platelets: 172 K/uL (ref 150–400)
RBC: 3.04 MIL/uL — ABNORMAL LOW (ref 4.22–5.81)
RDW: 13.2 % (ref 11.5–15.5)
WBC: 5.5 K/uL (ref 4.0–10.5)
nRBC: 0 % (ref 0.0–0.2)

## 2023-11-25 LAB — RAPID URINE DRUG SCREEN, HOSP PERFORMED
Amphetamines: NOT DETECTED
Barbiturates: NOT DETECTED
Benzodiazepines: NOT DETECTED
Cocaine: NOT DETECTED
Opiates: NOT DETECTED
Tetrahydrocannabinol: NOT DETECTED

## 2023-11-25 LAB — URINALYSIS, COMPLETE (UACMP) WITH MICROSCOPIC
Bacteria, UA: NONE SEEN
Bilirubin Urine: NEGATIVE
Glucose, UA: NEGATIVE mg/dL
Hgb urine dipstick: NEGATIVE
Ketones, ur: NEGATIVE mg/dL
Leukocytes,Ua: NEGATIVE
Nitrite: NEGATIVE
Protein, ur: NEGATIVE mg/dL
Specific Gravity, Urine: 1.016 (ref 1.005–1.030)
pH: 5 (ref 5.0–8.0)

## 2023-11-25 LAB — COMPREHENSIVE METABOLIC PANEL WITH GFR
ALT: 10 U/L (ref 0–44)
AST: 15 U/L (ref 15–41)
Albumin: 3 g/dL — ABNORMAL LOW (ref 3.5–5.0)
Alkaline Phosphatase: 78 U/L (ref 38–126)
Anion gap: 10 (ref 5–15)
BUN: 25 mg/dL — ABNORMAL HIGH (ref 8–23)
CO2: 18 mmol/L — ABNORMAL LOW (ref 22–32)
Calcium: 8.5 mg/dL — ABNORMAL LOW (ref 8.9–10.3)
Chloride: 106 mmol/L (ref 98–111)
Creatinine, Ser: 1.09 mg/dL (ref 0.61–1.24)
GFR, Estimated: >60 mL/min (ref >60)
Glucose, Bld: 184 mg/dL — ABNORMAL HIGH (ref 70–99)
Potassium: 3.9 mmol/L (ref 3.5–5.1)
Sodium: 134 mmol/L — ABNORMAL LOW (ref 135–145)
Total Bilirubin: 0.6 mg/dL (ref 0.0–1.2)
Total Protein: 5.1 g/dL — ABNORMAL LOW (ref 6.5–8.1)

## 2023-11-25 LAB — MAGNESIUM: Magnesium: 1.2 mg/dL — ABNORMAL LOW (ref 1.7–2.4)

## 2023-11-25 LAB — TSH: TSH: 1.559 u[IU]/mL (ref 0.350–4.500)

## 2023-11-25 LAB — CBG MONITORING, ED: Glucose-Capillary: 89 mg/dL (ref 70–99)

## 2023-11-25 LAB — D-DIMER, QUANTITATIVE: D-Dimer, Quant: 0.76 ug{FEU}/mL — ABNORMAL HIGH (ref 0.00–0.50)

## 2023-11-25 LAB — HEPARIN LEVEL (UNFRACTIONATED): Heparin Unfractionated: 0.54 [IU]/mL (ref 0.30–0.70)

## 2023-11-25 MED ORDER — LORAZEPAM 1 MG PO TABS: 0.5000 mg | ORAL_TABLET | Freq: Two times a day (BID) | ORAL | Status: DC | PRN

## 2023-11-25 MED ORDER — MAGNESIUM SULFATE 2 GM/50ML IV SOLN
2.0000 g | Freq: Once | INTRAVENOUS | Status: AC
  Administered 2023-11-25: 2 g via INTRAVENOUS
  Filled 2023-11-25: qty 50

## 2023-11-25 MED ORDER — ONDANSETRON HCL 4 MG/2ML IJ SOLN: 4.0000 mg | Freq: Four times a day (QID) | INTRAMUSCULAR | Status: DC | PRN

## 2023-11-25 MED ORDER — INSULIN ASPART 100 UNIT/ML IJ SOLN: 0.0000 [IU] | Freq: Three times a day (TID) | INTRAMUSCULAR | Status: DC

## 2023-11-25 MED ORDER — MAGNESIUM 400 MG PO TABS: 400.0000 mg | ORAL_TABLET | Freq: Every day | ORAL | 0 refills | Status: DC

## 2023-11-25 MED ORDER — LACTATED RINGERS IV SOLN: INTRAVENOUS | Status: DC

## 2023-11-25 MED ORDER — HEPARIN (PORCINE) 25000 UT/250ML-% IV SOLN
1100.0000 [IU]/h | INTRAVENOUS | Status: DC
  Administered 2023-11-25: 1100 [IU]/h via INTRAVENOUS
  Filled 2023-11-25: qty 250

## 2023-11-25 MED ORDER — MELATONIN 3 MG PO TABS: 3.0000 mg | ORAL_TABLET | Freq: Every evening | ORAL | Status: DC | PRN

## 2023-11-25 MED ORDER — ACETAMINOPHEN 650 MG RE SUPP: 650.0000 mg | Freq: Four times a day (QID) | RECTAL | Status: DC | PRN

## 2023-11-25 MED ORDER — ROSUVASTATIN CALCIUM 20 MG PO TABS: 20.0000 mg | ORAL_TABLET | ORAL | Status: DC

## 2023-11-25 MED ORDER — HEPARIN BOLUS VIA INFUSION
4000.0000 [IU] | Freq: Once | INTRAVENOUS | Status: AC
  Administered 2023-11-25: 4000 [IU] via INTRAVENOUS
  Filled 2023-11-25: qty 4000

## 2023-11-25 MED ORDER — ACETAMINOPHEN 325 MG PO TABS: 650.0000 mg | ORAL_TABLET | Freq: Four times a day (QID) | ORAL | Status: DC | PRN

## 2023-11-25 NOTE — ED Notes (Signed)
 Patient's wife to patient bedside

## 2023-11-25 NOTE — H&P (Signed)
 History and Physical      Jared Alvarez FMW:[id number] DOB: 08-Jun-1950 DOA: 11/24/2023; DOS: 11/25/2023  PCP: Garald Karlynn GAILS, MD  Patient coming from: home   I have personally briefly reviewed patient's old medical records in [facility]  Chief Complaint: Episode of loss of consciousness  HPI: Jared Alvarez is a 72 y.o. male with medical history significant for type 2 diabetes mellitus, coronary artery disease, essential pretension, hyperlipidemia, anemia of chronic disease associated baseline hemoglobin 11-12, who is admitted to [facility] on 11/24/2023 with episode of syncope as well as new diagnosis of atrial fibrillation after presenting from home to [facility] ED complaining of episode of loss of consciousness.   The patient reports a single episode of loss of consciousness that occurred earlier today as he was playing the bass guitar.  He noted that this was associated with prolonged standing, and was on the final song that he anticipated playing when he developed dizziness, lightheadedness, and subsequently experienced formal loss of consciousness resulting in a fall forward, ground-level, striking the left portion of his chest and left knee.  This was not associated any chest pain or shortness of breath nor any palpitations.   He reports that he experienced similar dizziness/lightheadedness of approximately 1 month ago when he was working for prolonged period of time on his deck in the hot sun.  At that time however, his dizziness/lightheadedness did not lead to formal loss of consciousness.   Denies any known history of atrial fibrillation.  Is on daily baby aspirin , but otherwise on no formal anticoagulation.  Most recent prior echocardiogram occurred in September 2019, which is notable for LVEF 55 to 60%, no focal wall motion abnormalities, grade 1 diastolic dysfunction mildly dilated left atrium and no significant valvular pathology.  He follows with Dr. Barnetta in  the setting of a documented history of coronary artery disease.     ED Course:  Vital signs in the ED were notable for the following: Afebrile; heart rates in the 60s to 70s; systolic blood pressures in the low 100s to 130s; respiratory rate 17-21, oxygen saturation 100% on room air.  Labs were notable for the following: BMP was notable for the following: Sodium 137, Tessman 3.5, creatinine 1.30, relative demonstrates prior creatinine data point of 1.01 on 10/22/2023, BUN to creatinine ratio greater than 20, glucose 150.  Magnesium  of 1.2.  CBC notable for white cell count 4900, hemoglobin 11.0 associated with normocytic/Norocarp properties and nonelevated RDW, relative to most recent prior hemoglobin data collected 12.1 on 10/22/2023.  INR 1.0.  Per my interpretation, EKG in ED demonstrated the following: Atrial fibrillation with heart rate 73, no evidence of T wave or ST changes, including no evidence of ST elevation.  Imaging in the ED, per corresponding formal radiology read, was notable for the following: CT head showed no evidence of acute intracranial process.  CT cervical spine showed no evidence of acute cervical spine fracture or subluxation injury.  Plain film of the left humerus showed acute inferiorly displaced left distal clavicular fracture.  Plain films of the left knee show evidence of acute fracture or dislocation.  In the setting of acute left distal clavicular fracture, orthopedic tech was consulted, and left upper extremity sling was placed.  While in the ED, the following were administered: Acetaminophen  1 g p.o. x 1, normal saline x 1 L bolus.  Subsequently, the patient was admitted for overnight observation for single episode of syncope with prodrome, with finding of  new diagnosis of rate controlled atrial fibrillation, acute kidney injury, with labs notable for hypomagnesemia.     Review of Systems: As per HPI otherwise 10 point review of systems negative.   Past Medical  History:  Diagnosis Date   Allergy    Anxiety    no per pt   Aortic aneurysm (HCC)    Coronary artery disease    Depression    no per pt   DM type 2 (diabetes mellitus, type 2) (HCC)    GERD (gastroesophageal reflux disease)    Gout    Hearing loss    Hyperlipidemia    Hypertension    Hypogonadism male    Inguinal hernia    right   LBP (low back pain)    Leg cramps    Osteoarthritis    Skin neoplasm    Tinnitus    since 1997   Vertigo    Vitamin B12 deficiency     Past Surgical History:  Procedure Laterality Date   COLONOSCOPY     FOOT SURGERY     07-2019 triple bone fusion   FOOT SURGERY  08/2020   repeat fusion   heart stent     in 1997   INGUINAL HERNIA REPAIR     POLYPECTOMY      Social History:  reports that he has never smoked. He has never used smokeless tobacco. He reports current alcohol use of about 7.0 standard drinks of alcohol per week. He reports that he does not use drugs.   Allergies  Allergen Reactions   Atorvastatin     REACTION: pains   Bupropion Hcl     REACTION: side effects   Doxazosin      Dizzy, syncope    Duloxetine     REACTION: dizzy   Fluticasone     Other reaction(s): other/intolerance   Nexium  [Esomeprazole  Magnesium ]     Rash on legs   Simvastatin     REACTION: pain   Venlafaxine     REACTION: side effects   Viibryd  [Vilazodone  Hcl]     nightmares   Vilazodone      Other reaction(s): psychological reaction Nightmares    Ketorolac  Palpitations   Tamsulosin  Palpitations    Family History  Problem Relation Age of Onset   Heart disease Father    Arthritis Sister    Lupus Sister    Heart disease Brother    CVA Brother    Coronary artery disease Other    Hypertension Other    Colon cancer Neg Hx    Esophageal cancer Neg Hx    Stomach cancer Neg Hx    Rectal cancer Neg Hx    Colon polyps Neg Hx     Family history reviewed and not pertinent    Prior to Admission medications   Medication Sig Start Date End  Date Taking? Authorizing Provider  acetaminophen  (TYLENOL ) 500 MG tablet Take 1 tablet (500 mg total) by mouth every 6 (six) hours as needed. 07/25/21   Plotnikov, Aleksei V, MD  alfuzosin (UROXATRAL) 10 MG 24 hr tablet Take 10 mg by mouth daily. 10/01/22   [provider]  aspirin  81 MG tablet Take 1 tablet (81 mg total) by mouth daily. 06/23/17   Plotnikov, Aleksei V, MD  ciclopirox  (PENLAC ) 8 % solution APPLY TOPICALLY AT BEDTIME OVER NAIL AND SURROUNDING SKIN. APPLY DAILY OVER PREVIOUS COAT. AFTER SEVEN 7 DAYS, MAY REMOVE WITH ALCOHOL AND CONTINUE CYCLE. 08/04/23   Plotnikov, Aleksei V, MD  colchicine  0.6  MG tablet Take 1 tablet (0.6 mg total) by mouth 2 (two) times daily. If patient has flare up with gout 10/28/20   Corey, Evan S, MD  Cyanocobalamin  (VITAMIN B-12) 500 MCG SUBL Place 1 tablet (500 mcg total) under the tongue daily. 06/23/17   Plotnikov, Aleksei V, MD  Efinaconazole  (JUBLIA ) 10 % SOLN Apply 1 application  topically every morning. Patient not taking: Reported on 10/26/2023 10/07/22   Plotnikov, Karlynn GAILS, MD  FLUoxetine  (PROZAC ) 40 MG capsule TAKE 1 CAPSULE (40 MG TOTAL) BY MOUTH DAILY. 07/14/23   Plotnikov, Aleksei V, MD  Homeopathic Products (ARNICARE ARNICA) OINT Apply 1 application topically 2 (two) times daily as needed (arthritis).    [provider]  HYDROcodone  bit-homatropine (HYCODAN) 5-1.5 MG/5ML syrup Take 5 mLs by mouth every 8 (eight) hours as needed for cough. Patient not taking: Reported on 10/26/2023 04/30/23   Plotnikov, Aleksei V, MD  ibuprofen (ADVIL) 200 MG tablet Ibuprofen cream PRN    [provider]  IBUPROFEN-LINIMENT CO by Combination route.    [provider]  LORazepam  (ATIVAN ) 1 MG tablet Take 0.5-1 tablets (0.5-1 mg total) by mouth 2 (two) times daily as needed for anxiety. 03/16/23   Plotnikov, Aleksei V, MD  losartan  (COZAAR ) 100 MG tablet TAKE 1 TABLET BY MOUTH EVERY DAY 04/13/23   Plotnikov, Aleksei V, MD  metFORMIN   (GLUCOPHAGE ) 1000 MG tablet TAKE ONE TABLET BY MOUTH TWICE A DAY WITH A MEAL 07/14/23   Plotnikov, Aleksei V, MD  pantoprazole  (PROTONIX ) 40 MG tablet TAKE 1 TABLET BY MOUTH TWICE A DAY 10/11/23   Plotnikov, Aleksei V, MD  rosuvastatin  (CRESTOR ) 20 MG tablet Take 1 tablet (20 mg total) by mouth 3 (three) times a week. Mon, Wed, and Fri 10/27/23   Plotnikov, Karlynn GAILS, MD  tadalafil  (CIALIS ) 5 MG tablet Take 1 tablet by mouth once daily 09/03/23   Plotnikov, Aleksei V, MD  triamcinolone  (NASACORT ) 55 MCG/ACT AERO nasal inhaler Place 2 sprays into the nose daily. 01/30/21   Norleen Lynwood ORN, MD  triamcinolone  cream (KENALOG ) 0.1 % Apply 1 application topically 2 (two) times daily. 04/08/21   Rollene Almarie LABOR, MD     Objective    Physical Exam: Vitals:   11/24/23 2153 11/24/23 2215 11/24/23 2230 11/24/23 2245  BP:  101/61 117/71   Pulse:  71 69 71  Resp:  (!) 21 18 17   Temp:      TempSrc:      SpO2:  100% 100% 100%  Weight: 75.3 kg     Height: 5' 8 (1.727 m)       General: appears to be stated age; alert, oriented Skin: warm, dry, no rash Head:  AT/[place] Mouth:  Oral mucosa membranes appear dry, normal dentition Neck: supple; trachea midline Heart: Irregular, but rate controlled; 2/6 systolic murmur Lungs: CTAB, did not appreciate any wheezes, rales, or rhonchi Abdomen: + BS; soft, ND, NT Vascular: 2+ pedal pulses b/l; 2+ radial pulses b/l Extremities: no peripheral edema, no muscle wasting    Labs on Admission: I have personally reviewed following labs and imaging studies  CBC: Recent Labs  Lab 11/24/23 2206  WBC 4.9  NEUTROABS 3.4  HGB 11.0*  HCT 32.8*  MCV 95.6  PLT 227   Basic Metabolic Panel: Recent Labs  Lab 11/24/23 2206  NA 137  K 3.5  CL 107  CO2 19*  GLUCOSE 150*  BUN 28*  CREATININE 1.30*  CALCIUM  8.9   GFR: Estimated Creatinine Clearance: 49  mL/min (A) (by C-G formula based on SCr of 1.3 mg/dL (H)). Liver Function Tests: No results for input(s):  AST, ALT, ALKPHOS, BILITOT, PROT, ALBUMIN in the last 168 hours. No results for input(s): LIPASE, AMYLASE in the last 168 hours. No results for input(s): AMMONIA in the last 168 hours. Coagulation Profile: Recent Labs  Lab 11/24/23 2206  INR 1.0   Cardiac Enzymes: No results for input(s): CKTOTAL, CKMB, CKMBINDEX, TROPONINI in the last 168 hours. BNP (last 3 results) No results for input(s): PROBNP in the last 8760 hours. HbA1C: No results for input(s): HGBA1C in the last 72 hours. CBG: Recent Labs  Lab 11/24/23 2338  GLUCAP 126*   Lipid Profile: No results for input(s): CHOL, HDL, LDLCALC, TRIG, CHOLHDL, LDLDIRECT in the last 72 hours. Thyroid  Function Tests: No results for input(s): TSH, T4TOTAL, FREET4, T3FREE, THYROIDAB in the last 72 hours. Anemia Panel: No results for input(s): VITAMINB12, FOLATE, FERRITIN, TIBC, IRON, RETICCTPCT in the last 72 hours. Urine analysis:    Component Value Date/Time   COLORURINE YELLOW 10/22/2023 0831   APPEARANCEUR CLEAR 10/22/2023 0831   LABSPEC 1.020 10/22/2023 0831   PHURINE 6.0 10/22/2023 0831   GLUCOSEU NEGATIVE 10/22/2023 0831   HGBUR NEGATIVE 10/22/2023 0831   BILIRUBINUR NEGATIVE 10/22/2023 0831   KETONESUR NEGATIVE 10/22/2023 0831   UROBILINOGEN 0.2 10/22/2023 0831   NITRITE NEGATIVE 10/22/2023 0831   LEUKOCYTESUR NEGATIVE 10/22/2023 0831    Radiological Exams on Admission: CT Head Wo Contrast Result Date: 11/24/2023 CLINICAL DATA:  Syncope EXAM: CT HEAD WITHOUT CONTRAST CT CERVICAL SPINE WITHOUT CONTRAST TECHNIQUE: Multidetector CT imaging of the head and cervical spine was performed following the standard protocol without intravenous contrast. Multiplanar CT image reconstructions of the cervical spine were also generated. RADIATION DOSE REDUCTION: This exam was performed according to the departmental dose-optimization program which includes automated exposure  control, adjustment of the mA and/or kV according to patient size and/or use of iterative reconstruction technique. COMPARISON:  None Available. FINDINGS: CT HEAD FINDINGS Brain: No acute territorial infarction, hemorrhage, or intracranial mass. Mild atrophy. The ventricles are nonenlarged Vascular: No hyperdense vessels.  Carotid vascular calcification Skull: Normal. Negative for fracture or focal lesion. Sinuses/Orbits: No acute finding. Other: None CT CERVICAL SPINE FINDINGS Alignment: Trace anterolisthesis C2 on C3. Trace retrolisthesis C4 on C5 and trace anterolisthesis C6 on C7. Facet alignment is normal Skull base and vertebrae: No acute fracture. No primary bone lesion or focal pathologic process. Soft tissues and spinal canal: No prevertebral fluid or swelling. No visible canal hematoma. Disc levels: Advanced disc space narrowing and degenerative change C3 through C7. Advanced C1-C2 degenerative change. Multilevel facet degenerative change with foraminal narrowing Upper chest: Negative. Other: None IMPRESSION: 1. No CT evidence for acute intracranial abnormality. Mild atrophy. 2. Degenerative changes of the cervical spine. No acute osseous abnormality. Electronically Signed   By: Luke Bun M.D.   On: 11/24/2023 22:58   CT Cervical Spine Wo Contrast Result Date: 11/24/2023 CLINICAL DATA:  Syncope EXAM: CT HEAD WITHOUT CONTRAST CT CERVICAL SPINE WITHOUT CONTRAST TECHNIQUE: Multidetector CT imaging of the head and cervical spine was performed following the standard protocol without intravenous contrast. Multiplanar CT image reconstructions of the cervical spine were also generated. RADIATION DOSE REDUCTION: This exam was performed according to the departmental dose-optimization program which includes automated exposure control, adjustment of the mA and/or kV according to patient size and/or use of iterative reconstruction technique. COMPARISON:  None Available. FINDINGS: CT HEAD FINDINGS Brain: No acute  territorial infarction,  hemorrhage, or intracranial mass. Mild atrophy. The ventricles are nonenlarged Vascular: No hyperdense vessels.  Carotid vascular calcification Skull: Normal. Negative for fracture or focal lesion. Sinuses/Orbits: No acute finding. Other: None CT CERVICAL SPINE FINDINGS Alignment: Trace anterolisthesis C2 on C3. Trace retrolisthesis C4 on C5 and trace anterolisthesis C6 on C7. Facet alignment is normal Skull base and vertebrae: No acute fracture. No primary bone lesion or focal pathologic process. Soft tissues and spinal canal: No prevertebral fluid or swelling. No visible canal hematoma. Disc levels: Advanced disc space narrowing and degenerative change C3 through C7. Advanced C1-C2 degenerative change. Multilevel facet degenerative change with foraminal narrowing Upper chest: Negative. Other: None IMPRESSION: 1. No CT evidence for acute intracranial abnormality. Mild atrophy. 2. Degenerative changes of the cervical spine. No acute osseous abnormality. Electronically Signed   By: Luke Bun M.D.   On: 11/24/2023 22:58   DG Humerus Left Result Date: 11/24/2023 CLINICAL DATA:  fall EXAM: LEFT HUMERUS - 2+ VIEW COMPARISON:  CTA chest 05/21/2023 FINDINGS: Acute inferiorly displaced distal clavicular fracture. Severe degenerative changes of the acromioclavicular joint. No acute displaced fracture or dislocation of the humerus. Elbow is grossly unremarkable. Shoulder otherwise unremarkable. Soft tissues are unremarkable. IMPRESSION: Acute inferiorly displaced left distal clavicular fracture. Electronically Signed   By: Morgane  Naveau M.D.   On: 11/24/2023 22:35   DG Knee Complete 4 Views Left Result Date: 11/24/2023 CLINICAL DATA:  fall EXAM: LEFT KNEE - COMPLETE 4+ VIEW COMPARISON:  X-ray left knee 02/17/2018 FINDINGS: No evidence of fracture, dislocation, or joint effusion. Mild tricompartmental degenerative changes of the knee with associated chondrocalcinosis. No aggressive appearing  focal osseous lesion. Soft tissues are unremarkable. Vascular calcifications. IMPRESSION: No acute displaced fracture or dislocation. Electronically Signed   By: Morgane  Naveau M.D.   On: 11/24/2023 22:31      Assessment/Plan   Principal Problem:   Syncope Active Problems:   DM2 (diabetes mellitus, type 2) (HCC)   Anemia of chronic disease   HLD (hyperlipidemia)   Paroxysmal atrial fibrillation (HCC)   AKI (acute kidney injury) (HCC)   Closed left clavicular fracture   Hypomagnesemia   History of essential hypertension     # (Syncope: Single episode of syncope associated prodrome with differential including vasovagal syncope given prolonged period of standing, with potential exacerbation on the basis of clinical evidence of dehydration, particular given evidence of acute prerenal azotemia.  This is in addition to pharmacologic exacerbation of these physiologic features as a consequence of multiple medications inhibiting compensatory vasodilation, including outpatient use of losartan  as well as alpha Zosyn.  Additionally, this is all superimposed on new finding of atrial fibrillation, which may also have provided a contributory factor given associated relative decline in cardiac output due to associated loss of atrial kick.  ACS appears less likely in the absence of any chest pain, while there are no evidence of T wave or ST changes on presenting EKG, including no evidence of ST elevation.  Given new diagnosis of atrial fibrillation in the setting of presenting syncope, in the absence of interval anticoagulation, will check D-dimer at this time.  Plan: Orthostatic vital signs followed by continuous lactated Ringer's at 75 cc/h x 10 hours.  Monitor strict I's and O's and daily weights.  Hold home alpha Zosyn and losartan  for now.  Fall precautions ordered.  Echocardiogram the morning.  Check D-dimer.  Further evaluation management of presenting hypomagnesemia, as below.  Repeat CMP, CBC in the  morning.  Further evaluation management of  new finding of rate controlled atrial fibrillation, as above.                  #) New diagnosis of atrial fibrillation: Presenting EKG shows rate controlled atrial fibrillation without overt evidence of acute ischemic changes.  This appears to be a new diagnosis for the patient who is not formally anticoagulated at this time.  CHA2DS2-VASc score is noted to be 4, conferring a indication for initiation of chronic anticoagulation for thromboembolic prophylaxis.  Consequently, I have not initiated heparin  via inpatient pharmacy consult.  Will check updated echocardiogram.  Will refrain from initiation of AV nodal blocking agent, with heart rates currently in the 60s.  Plan: Echocardiogram the morning.  Monitor on telemetry.  Heparin  drip [facility] and patient pharmacy consult.  Check D-dimer.  Chest x-ray.  Repeat CMP, CBC.  Further evaluation man check urinary drug screen.  Agement presenting hypomagnesemia, as below.                    #) Acute Kidney Injury:   Presenting creatinine 1.30 compared to most recent prior value 1.01 on 6/202025.  Suspect a prerenal element, with suspicion for contribution from intravascular depletion, noting acute prerenal azotemia, with potential pharmacologic exacerbation in the setting of outpatient use of losartan .  Plan: monitor strict I's & O's and daily weights. Attempt to avoid nephrotoxic agents.  Hold home losartan  for now.  Refrain from NSAIDs. Repeat CMP in the morning.  Check urinalysis with microscopy.  Gentle IV fluids, as above.                    # Acute displaced left total macular fracture: As a result of fall standing from today syncopal episode, plain film of the humerus reveals acute inferiorly displaced left distal clavicular fracture, with left upper extremity appearing neurovascular intact.  EDP is consulted orthopedic tech, who has applied left upper extremity sling.   No overt indication for urgent surgical intervention.  Plan: Sling per orthopedic surgery tech.  Prn acetaminophen .                   #) Hypomagnesemia: presenting serum mag level noted to be 1.2.    Plan: magnesium  sulfate 2 g IV over 2 hours. Monitor on tele. Repeat serum mag level in the AM.                        #) Type 2 Diabetes Mellitus: documented history of such. Home insulin  regimen: None. Home oral hypoglycemic agents: Metformin . presenting blood sugar: 150. Most recent A1c noted to be 6.0% when checked on 10/22/2023.   Plan: accuchecks QAC and HS with low dose SSI.  Hold home metformin  during this hospitalization.                     #) Essential Hypertension: documented h/o such, with outpatient antihypertensive regimen including losartan .  Also noted to be on alfuzosin as outpatient low 100s to 130s. SBP's in the  mmHg.   Plan: Close monitoring of subsequent BP via routine VS. in setting of presenting syncope, will will next doses of losartan  and off as a send for now.  Follow results of orthostatic vital signs.  Monitor strict I's and O's and daily weights.                      #) Hyperlipidemia: documented h/o such. On rosuvastatin  as  outpatient.   Plan: continue home statin.                      #) Anemia of chronic disease: Documented history of such, a/w with baseline hgb range 11-12, with presenting hgb consistent with this range, in the absence of any overt evidence of active bleed.  Close monitoring in setting hemoglobin trend, particularly given initial plan for initiation of anticoagulation in the context of new diagnosis of atrial fibrillation, as above.   Plan: Repeat CBC in the morning.         DVT prophylaxis: SCD's + heparin  drip, as above Code Status: Full code Family Communication: none Disposition Plan: Per Rounding Team Consults called: none;   Admission status: Observation     I SPENT GREATER THAN 75  MINUTES IN CLINICAL CARE TIME/MEDICAL DECISION-MAKING IN COMPLETING THIS ADMISSION.      Eva NOVAK Shahad Mazurek DO [facility]  From 7PM - 7AM   11/25/2023, 1:25 AM

## 2023-11-25 NOTE — ED Notes (Signed)
 RN called CCMD

## 2023-11-25 NOTE — Progress Notes (Signed)
 PHARMACY - ANTICOAGULATION CONSULT NOTE  Pharmacy Consult for Heparin  Indication: atrial fibrillation  Allergies  Allergen Reactions   Atorvastatin     REACTION: pains   Bupropion Hcl     REACTION: side effects   Doxazosin      Dizzy, syncope    Duloxetine     REACTION: dizzy   Fluticasone     Other reaction(s): other/intolerance   Nexium  [Esomeprazole  Magnesium ]     Rash on legs   Simvastatin     REACTION: pain   Venlafaxine     REACTION: side effects   Viibryd  [Vilazodone  Hcl]     nightmares   Vilazodone      Other reaction(s): psychological reaction Nightmares    Ketorolac  Palpitations   Tamsulosin  Palpitations    Patient Measurements: Height: 5' 8 (172.7 cm) Weight: 75.3 kg (166 lb) IBW/kg (Calculated) : 68.4 HEPARIN  DW (KG): 75.3  Vital Signs: Temp: 97.9 F (36.6 C) (07/24 0126) Temp Source: Oral (07/23 2149) BP: 139/69 (07/24 0130) Pulse Rate: 67 (07/24 0130)  Labs: Recent Labs    11/24/23 2206  HGB 11.0*  HCT 32.8*  PLT 227  LABPROT 13.9  INR 1.0  CREATININE 1.30*    Estimated Creatinine Clearance: 49 mL/min (A) (by C-G formula based on SCr of 1.3 mg/dL (H)).   Medical History: Past Medical History:  Diagnosis Date   Allergy    Anxiety    no per pt   Aortic aneurysm (HCC)    Coronary artery disease    Depression    no per pt   DM type 2 (diabetes mellitus, type 2) (HCC)    GERD (gastroesophageal reflux disease)    Gout    Hearing loss    Hyperlipidemia    Hypertension    Hypogonadism male    Inguinal hernia    right   LBP (low back pain)    Leg cramps    Osteoarthritis    Skin neoplasm    Tinnitus    since 1997   Vertigo    Vitamin B12 deficiency     Medications:  No current facility-administered medications on file prior to encounter.   Current Outpatient Medications on File Prior to Encounter  Medication Sig Dispense Refill   acetaminophen  (TYLENOL ) 500 MG tablet Take 1 tablet (500 mg total) by mouth every 6 (six)  hours as needed. 30 tablet 2   alfuzosin (UROXATRAL) 10 MG 24 hr tablet Take 10 mg by mouth daily.     aspirin  81 MG tablet Take 1 tablet (81 mg total) by mouth daily. 30 tablet 2   ciclopirox  (PENLAC ) 8 % solution APPLY TOPICALLY AT BEDTIME OVER NAIL AND SURROUNDING SKIN. APPLY DAILY OVER PREVIOUS COAT. AFTER SEVEN 7 DAYS, MAY REMOVE WITH ALCOHOL AND CONTINUE CYCLE. 6.6 mL 0   colchicine  0.6 MG tablet Take 1 tablet (0.6 mg total) by mouth 2 (two) times daily. If patient has flare up with gout 30 tablet 5   Cyanocobalamin  (VITAMIN B-12) 500 MCG SUBL Place 1 tablet (500 mcg total) under the tongue daily. 150 tablet 2   Efinaconazole  (JUBLIA ) 10 % SOLN Apply 1 application  topically every morning. (Patient not taking: Reported on 10/26/2023) 4 mL 2   FLUoxetine  (PROZAC ) 40 MG capsule TAKE 1 CAPSULE (40 MG TOTAL) BY MOUTH DAILY. 90 capsule 1   Homeopathic Products (ARNICARE ARNICA) OINT Apply 1 application topically 2 (two) times daily as needed (arthritis).     HYDROcodone  bit-homatropine (HYCODAN) 5-1.5 MG/5ML syrup Take 5 mLs by  mouth every 8 (eight) hours as needed for cough. (Patient not taking: Reported on 10/26/2023) 240 mL 0   ibuprofen (ADVIL) 200 MG tablet Ibuprofen cream PRN     IBUPROFEN-LINIMENT CO by Combination route.     LORazepam  (ATIVAN ) 1 MG tablet Take 0.5-1 tablets (0.5-1 mg total) by mouth 2 (two) times daily as needed for anxiety. 60 tablet 3   losartan  (COZAAR ) 100 MG tablet TAKE 1 TABLET BY MOUTH EVERY DAY 90 tablet 3   metFORMIN  (GLUCOPHAGE ) 1000 MG tablet TAKE ONE TABLET BY MOUTH TWICE A DAY WITH A MEAL 180 tablet 3   pantoprazole  (PROTONIX ) 40 MG tablet TAKE 1 TABLET BY MOUTH TWICE A DAY 180 tablet 3   rosuvastatin  (CRESTOR ) 20 MG tablet Take 1 tablet (20 mg total) by mouth 3 (three) times a week. Mon, Wed, and Fri 36 tablet 4   tadalafil  (CIALIS ) 5 MG tablet Take 1 tablet by mouth once daily 90 tablet 0   triamcinolone  (NASACORT ) 55 MCG/ACT AERO nasal inhaler Place 2 sprays  into the nose daily. 1 each 12   triamcinolone  cream (KENALOG ) 0.1 % Apply 1 application topically 2 (two) times daily. 100 g 0     Assessment: 73 y.o. male with syncope and Afib for heparin  Goal of Therapy:  Heparin  level 0.3-0.7 units/ml Monitor platelets by anticoagulation protocol: Yes   Plan:  Heparin  4000 units IV bolus, then start heparin  1100 units/hr Check heparin  level in 8 hours.   Dail Cordella Misty 11/25/2023,1:42 AM

## 2023-11-25 NOTE — Progress Notes (Signed)
 DISCHARGE NOTE HOME Garth Diffley Badley to be discharged Home per MD order. Discussed prescriptions and follow up appointments with the patient. Prescriptions given to patient; medication list explained in detail. Patient verbalized understanding.  Skin clean, dry and intact without evidence of skin break down, no evidence of skin tears noted. IV catheter discontinued intact. Site without signs and symptoms of complications. Dressing and pressure applied. Pt denies pain at the site currently. No complaints noted.  Patient free of lines, drains, and wounds.   An After Visit Summary (AVS) was printed and given to the patient. Patient escorted via wheelchair, and discharged home via private auto.  Peyton SHAUNNA Pepper, RN

## 2023-11-25 NOTE — Progress Notes (Signed)
                                                                                       PHARMACY - ANTICOAGULATION CONSULT NOTE  Pharmacy Consult for Heparin  Indication: atrial fibrillation  Allergies  Allergen Reactions   Atorvastatin     REACTION: pains   Bupropion Hcl     REACTION: side effects   Doxazosin      Dizzy, syncope    Duloxetine     REACTION: dizzy   Fluticasone     Other reaction(s): other/intolerance   Nexium  [Esomeprazole  Magnesium ]     Rash on legs   Simvastatin     REACTION: pain   Venlafaxine     REACTION: side effects   Viibryd  [Vilazodone  Hcl]     nightmares   Vilazodone      Other reaction(s): psychological reaction Nightmares    Ketorolac  Palpitations   Tamsulosin  Palpitations    Patient Measurements: Height: 5' 8 (172.7 cm) Weight: 75.3 kg (166 lb) IBW/kg (Calculated) : 68.4 HEPARIN  DW (KG): 75.3  Vital Signs: Temp: 98.5 F (36.9 C) (07/24 0800) Temp Source: Oral (07/24 0800) BP: 153/79 (07/24 0953) Pulse Rate: 58 (07/24 0953)  Labs: Recent Labs    11/24/23 2206 11/25/23 0442 11/25/23 1007  HGB 11.0* 9.8*  --   HCT 32.8* 28.4*  --   PLT 227 172  --   LABPROT 13.9  --   --   INR 1.0  --   --   HEPARINUNFRC  --   --  0.54  CREATININE 1.30* 1.09  --     Estimated Creatinine Clearance: 58.4 mL/min (by C-G formula based on SCr of 1.09 mg/dL).   Medical History: Past Medical History:  Diagnosis Date   Allergy    Anxiety    no per pt   Aortic aneurysm (HCC)    Coronary artery disease    Depression    no per pt   DM type 2 (diabetes mellitus, type 2) (HCC)    GERD (gastroesophageal reflux disease)    Gout    Hearing loss    Hyperlipidemia    Hypertension    Hypogonadism male    Inguinal hernia    right   LBP (low back pain)    Leg cramps    Osteoarthritis    Skin neoplasm    Tinnitus    since 1997   Vertigo    Vitamin B12  deficiency    Assessment: Jared Alvarez is a 73 y.o. year old male admitted on 11/24/2023 with concern for syncope and found to have new onset afib. No anticoagulation prior to admission. Pharmacy consulted to dose heparin .  Heparin  level 0.54, therapeutic No issues with infusion or bleeding per RN.   Goal of Therapy:  Heparin  level 0.3-0.7 units/ml Monitor platelets by anticoagulation protocol: Yes   Plan:  Continue heparin  infusion at 1100 units/hr Daily heparin  level, CBC, and monitoring for bleeding F/u plans for anticoagulation and ability to switch to oral therapy  Thank you for allowing pharmacy to participate in this patient's care.  Jared Alvarez, PharmD Emergency Medicine Clinical Pharmacist 11/25/2023,10:50 AM

## 2023-11-25 NOTE — Discharge Summary (Signed)
 Physician Discharge Summary  Jared Alvarez FMW:[id number] DOB: [birth year] DOA: 11/24/2023  PCP: Garald Karlynn GAILS, MD  Admit date: 11/24/2023 Discharge date: 11/25/2023  Admitted From: Home Disposition: Home  Recommendations for Outpatient Follow-up:  Follow up with PCP in 1-2 weeks Please obtain BMP/CBC/magnesium /phosphorus in one week Cardiology to schedule follow-up  Discharge Condition: Stable CODE STATUS: Full code Diet recommendation: Low-salt diet  Discharge summary:  73 year old gentleman with history of coronary artery disease status post LAD stent in 1997, hypertension, hyperlipidemia, type 2 diabetes on metformin  who is suffering from very rare sporadic syncopal episodes since last 6 years, had prodromal syndrome dizziness lightheadedness and blacked out and fell while playing guitar and standing for about 3 hours.  In the emergency room hemodynamically stable.  Negative for orthostatics.  1 L of fluid given.  Initial EKG with A-fib but rate controlled.  Asymptomatic since admission.  Syncope: Suggestive of vasovagal or orthostatic.  Does have chronic bradycardia.  Also found to have rate controlled A-fib which spontaneously converted to sinus rhythm. Negative for orthostatic symptoms.  Echocardiogram with slightly reduced ejection fraction but otherwise stable.  No valvular heart disease. Suggested compression socks while standing.  Adequate hydration. Rate controlled paroxysmal A-fib, anticoagulation suggested-patient declined.  He is on aspirin . Seen by cardiology, they will schedule outpatient follow-up.  Patient was treated with Zio patch for ambulatory monitoring of arrhythmias before discharge.  Fall/distal clavicle fracture on x-ray: Patient without any pain.  This is likely old avulsion fracture.  Since she has full mobility and no pain, currently not needing any immobilization or sling.  He can use it with full range of motion.  CAD: No evidence of acute coronary  syndrome.  Already optimized on treatment.  Hypomagnesemia: Significant hypomagnesemia with magnesium  of 1.2 on presentation.  Given poor gram of IV magnesium  sulfate.  Will discharge patient on magnesium  oxide 400 mg daily.  Will need recheck in 1 to 2 weeks.  Stable to discharge home.  Discussed with cardiology before discharge.   Discharge Diagnoses:  Principal Problem:   Syncope Active Problems:   DM2 (diabetes mellitus, type 2) (HCC)   Anemia of chronic disease   HLD (hyperlipidemia)   New onset atrial fibrillation (HCC)   AKI (acute kidney injury) (HCC)   Closed left clavicular fracture   Hypomagnesemia   History of essential hypertension   Coronary artery disease involving native coronary artery of native heart without angina pectoris   Syncope and collapse    Discharge Instructions  Discharge Instructions     Diet - low sodium heart healthy   Complete by: As directed    Discharge instructions   Complete by: As directed    Wear compression socks  Cardiology to schedule follow up   Increase activity slowly   Complete by: As directed       Allergies as of 11/25/2023       Reactions   Atorvastatin    REACTION: pains   Bupropion Hcl    REACTION: side effects   Doxazosin     Dizzy, syncope   Duloxetine    REACTION: dizzy   Fluticasone    Other reaction(s): other/intolerance   Nexium  [esomeprazole  Magnesium ]    Rash on legs   Simvastatin    REACTION: pain   Venlafaxine    REACTION: side effects   Viibryd  [vilazodone  Hcl]    nightmares   Vilazodone     Other reaction(s): psychological reaction Nightmares    Ketorolac  Palpitations   Tamsulosin  Palpitations  Medication List     STOP taking these medications    ciclopirox  8 % solution Commonly known as: PENLAC    HYDROcodone  bit-homatropine 5-1.5 MG/5ML syrup Commonly known as: HYCODAN   Jublia  10 % Soln Generic drug: Efinaconazole    tadalafil  5 MG tablet Commonly known as: CIALIS     triamcinolone  55 MCG/ACT Aero nasal inhaler Commonly known as: NASACORT    triamcinolone  cream 0.1 % Commonly known as: KENALOG        TAKE these medications    acetaminophen  500 MG tablet Commonly known as: TYLENOL  Take 1 tablet (500 mg total) by mouth every 6 (six) hours as needed. What changed: reasons to take this   alfuzosin 10 MG 24 hr tablet Commonly known as: UROXATRAL Take 10 mg by mouth every evening.   Arnicare Arnica Oint Apply 1 application topically 2 (two) times daily as needed (arthritis).   aspirin  81 MG tablet Take 1 tablet (81 mg total) by mouth daily.   colchicine  0.6 MG tablet Take 1 tablet (0.6 mg total) by mouth 2 (two) times daily. If patient has flare up with gout   FLUoxetine  40 MG capsule Commonly known as: PROZAC  TAKE 1 CAPSULE (40 MG TOTAL) BY MOUTH DAILY.   ibuprofen 200 MG tablet Commonly known as: ADVIL Take 200 mg by mouth every 6 (six) hours as needed for mild pain (pain score 1-3).   IBUPROFEN-LINIMENT CO by Combination route.   LORazepam  1 MG tablet Commonly known as: ATIVAN  Take 0.5-1 tablets (0.5-1 mg total) by mouth 2 (two) times daily as needed for anxiety. What changed:  how much to take when to take this   losartan  100 MG tablet Commonly known as: COZAAR  TAKE 1 TABLET BY MOUTH EVERY DAY What changed: when to take this   Magnesium  400 MG Tabs Take 400 mg by mouth daily at 6 (six) AM.   metFORMIN  1000 MG tablet Commonly known as: GLUCOPHAGE  TAKE ONE TABLET BY MOUTH TWICE A DAY WITH A MEAL   pantoprazole  40 MG tablet Commonly known as: PROTONIX  TAKE 1 TABLET BY MOUTH TWICE A DAY   rosuvastatin  20 MG tablet Commonly known as: CRESTOR  Take 1 tablet (20 mg total) by mouth 3 (three) times a week. Mon, Wed, and Fri   Vitamin B-12 500 MCG Subl Place 1 tablet (500 mcg total) under the tongue daily. What changed:  when to take this additional instructions   Voltaren 1 % Gel Generic drug: diclofenac Sodium Apply  2 g topically daily as needed (for shoulder pain).        Allergies  Allergen Reactions   Atorvastatin     REACTION: pains   Bupropion Hcl     REACTION: side effects   Doxazosin      Dizzy, syncope    Duloxetine     REACTION: dizzy   Fluticasone     Other reaction(s): other/intolerance   Nexium  [Esomeprazole  Magnesium ]     Rash on legs   Simvastatin     REACTION: pain   Venlafaxine     REACTION: side effects   Viibryd  [Vilazodone  Hcl]     nightmares   Vilazodone      Other reaction(s): psychological reaction Nightmares    Ketorolac  Palpitations   Tamsulosin  Palpitations    Consultations: Cardiology   Procedures/Studies: DG Chest Port 1 View Result Date: 11/25/2023 CLINICAL DATA:  106001.  Syncope.  Fell. EXAM: PORTABLE CHEST 1 VIEW COMPARISON:  Chest PA Lat 01/09/2023. FINDINGS: There is mild cardiomegaly. No evidence of CHF. Stable mediastinum with  aortic calcific plaques and tortuosity. The lungs are clear. There is no substantial pleural effusion. Thoracic spondylosis. The bones are demineralized.  No acute osseous findings are seen AP. IMPRESSION: 1. No acute radiographic chest findings. 2. Cardiomegaly. 3. Aortic atherosclerosis. 4. Osteopenia. Electronically Signed   By: Francis Quam M.D.   On: 11/25/2023 07:15   CT Head Wo Contrast Result Date: 11/24/2023 CLINICAL DATA:  Syncope EXAM: CT HEAD WITHOUT CONTRAST CT CERVICAL SPINE WITHOUT CONTRAST TECHNIQUE: Multidetector CT imaging of the head and cervical spine was performed following the standard protocol without intravenous contrast. Multiplanar CT image reconstructions of the cervical spine were also generated. RADIATION DOSE REDUCTION: This exam was performed according to the departmental dose-optimization program which includes automated exposure control, adjustment of the mA and/or kV according to patient size and/or use of iterative reconstruction technique. COMPARISON:  None Available. FINDINGS: CT HEAD  FINDINGS Brain: No acute territorial infarction, hemorrhage, or intracranial mass. Mild atrophy. The ventricles are nonenlarged Vascular: No hyperdense vessels.  Carotid vascular calcification Skull: Normal. Negative for fracture or focal lesion. Sinuses/Orbits: No acute finding. Other: None CT CERVICAL SPINE FINDINGS Alignment: Trace anterolisthesis C2 on C3. Trace retrolisthesis C4 on C5 and trace anterolisthesis C6 on C7. Facet alignment is normal Skull base and vertebrae: No acute fracture. No primary bone lesion or focal pathologic process. Soft tissues and spinal canal: No prevertebral fluid or swelling. No visible canal hematoma. Disc levels: Advanced disc space narrowing and degenerative change C3 through C7. Advanced C1-C2 degenerative change. Multilevel facet degenerative change with foraminal narrowing Upper chest: Negative. Other: None IMPRESSION: 1. No CT evidence for acute intracranial abnormality. Mild atrophy. 2. Degenerative changes of the cervical spine. No acute osseous abnormality. Electronically Signed   By: Luke Bun M.D.   On: 11/24/2023 22:58   CT Cervical Spine Wo Contrast Result Date: 11/24/2023 CLINICAL DATA:  Syncope EXAM: CT HEAD WITHOUT CONTRAST CT CERVICAL SPINE WITHOUT CONTRAST TECHNIQUE: Multidetector CT imaging of the head and cervical spine was performed following the standard protocol without intravenous contrast. Multiplanar CT image reconstructions of the cervical spine were also generated. RADIATION DOSE REDUCTION: This exam was performed according to the departmental dose-optimization program which includes automated exposure control, adjustment of the mA and/or kV according to patient size and/or use of iterative reconstruction technique. COMPARISON:  None Available. FINDINGS: CT HEAD FINDINGS Brain: No acute territorial infarction, hemorrhage, or intracranial mass. Mild atrophy. The ventricles are nonenlarged Vascular: No hyperdense vessels.  Carotid vascular  calcification Skull: Normal. Negative for fracture or focal lesion. Sinuses/Orbits: No acute finding. Other: None CT CERVICAL SPINE FINDINGS Alignment: Trace anterolisthesis C2 on C3. Trace retrolisthesis C4 on C5 and trace anterolisthesis C6 on C7. Facet alignment is normal Skull base and vertebrae: No acute fracture. No primary bone lesion or focal pathologic process. Soft tissues and spinal canal: No prevertebral fluid or swelling. No visible canal hematoma. Disc levels: Advanced disc space narrowing and degenerative change C3 through C7. Advanced C1-C2 degenerative change. Multilevel facet degenerative change with foraminal narrowing Upper chest: Negative. Other: None IMPRESSION: 1. No CT evidence for acute intracranial abnormality. Mild atrophy. 2. Degenerative changes of the cervical spine. No acute osseous abnormality. Electronically Signed   By: Luke Bun M.D.   On: 11/24/2023 22:58   DG Humerus Left Result Date: 11/24/2023 CLINICAL DATA:  fall EXAM: LEFT HUMERUS - 2+ VIEW COMPARISON:  CTA chest 05/21/2023 FINDINGS: Acute inferiorly displaced distal clavicular fracture. Severe degenerative changes of the acromioclavicular joint. No acute displaced  fracture or dislocation of the humerus. Elbow is grossly unremarkable. Shoulder otherwise unremarkable. Soft tissues are unremarkable. IMPRESSION: Acute inferiorly displaced left distal clavicular fracture. Electronically Signed   By: Morgane  Naveau M.D.   On: 11/24/2023 22:35   DG Knee Complete 4 Views Left Result Date: 11/24/2023 CLINICAL DATA:  fall EXAM: LEFT KNEE - COMPLETE 4+ VIEW COMPARISON:  X-ray left knee 02/17/2018 FINDINGS: No evidence of fracture, dislocation, or joint effusion. Mild tricompartmental degenerative changes of the knee with associated chondrocalcinosis. No aggressive appearing focal osseous lesion. Soft tissues are unremarkable. Vascular calcifications. IMPRESSION: No acute displaced fracture or dislocation. Electronically  Signed   By: Morgane  Naveau M.D.   On: 11/24/2023 22:31   (Echo, Carotid, EGD, Colonoscopy, ERCP)    Subjective: Patient seen and examined in the morning rounds and again in the afternoon for discharge readiness.  Wife at bedside.  Getting echocardiogram.  Eager to go home.   Discharge Exam: Vitals:   11/25/23 0953 11/25/23 1241  BP: (!) 153/79 (!) 168/83  Pulse: (!) 58   Resp: 15 17  Temp:  98.2 F (36.8 C)  SpO2: 100%    Vitals:   11/25/23 0950 11/25/23 0951 11/25/23 0953 11/25/23 1241  BP: (!) 159/83 (!) 167/76 (!) 153/79 (!) 168/83  Pulse: (!) 58 (!) 56 (!) 58   Resp: 16 12 15 17   Temp:    98.2 F (36.8 C)  TempSrc:    Oral  SpO2: 100% 100% 100%   Weight:    77.4 kg  Height:    5' 8 (1.727 m)    General: Pt is alert, awake, not in acute distress Cardiovascular: RRR, S1/S2 +, no rubs, no gallops Respiratory: CTA bilaterally, no wheezing, no rhonchi Abdominal: Soft, NT, ND, bowel sounds + Extremities: no edema, no cyanosis    The results of significant diagnostics from this hospitalization (including imaging, microbiology, ancillary and laboratory) are listed below for reference.     Microbiology: No results found for this or any previous visit (from the past 240 hours).   Labs: BNP (last 3 results) No results for input(s): BNP in the last 8760 hours. Basic Metabolic Panel: Recent Labs  Lab 11/24/23 2206 11/25/23 0442  NA 137 134*  K 3.5 3.9  CL 107 106  CO2 19* 18*  GLUCOSE 150* 184*  BUN 28* 25*  CREATININE 1.30* 1.09  CALCIUM  8.9 8.5*  MG  --  1.2*   Liver Function Tests: Recent Labs  Lab 11/25/23 0442  AST 15  ALT 10  ALKPHOS 78  BILITOT 0.6  PROT 5.1*  ALBUMIN 3.0*   No results for input(s): LIPASE, AMYLASE in the last 168 hours. No results for input(s): AMMONIA in the last 168 hours. CBC: Recent Labs  Lab 11/24/23 2206 11/25/23 0442  WBC 4.9 5.5  NEUTROABS 3.4 3.8  HGB 11.0* 9.8*  HCT 32.8* 28.4*  MCV 95.6 93.4   PLT 227 172   Cardiac Enzymes: No results for input(s): CKTOTAL, CKMB, CKMBINDEX, TROPONINI in the last 168 hours. BNP: Invalid input(s): POCBNP CBG: Recent Labs  Lab 11/24/23 2338 11/25/23 0758  GLUCAP 126* 89   D-Dimer Recent Labs    11/25/23 1007  DDIMER 0.76*   Hgb A1c No results for input(s): HGBA1C in the last 72 hours. Lipid Profile No results for input(s): CHOL, HDL, LDLCALC, TRIG, CHOLHDL, LDLDIRECT in the last 72 hours. Thyroid  function studies Recent Labs    11/25/23 0442  TSH 1.559   Anemia work up No  results for input(s): VITAMINB12, FOLATE, FERRITIN, TIBC, IRON, RETICCTPCT in the last 72 hours. Urinalysis    Component Value Date/Time   COLORURINE YELLOW 11/25/2023 0706   APPEARANCEUR CLEAR 11/25/2023 0706   LABSPEC 1.016 11/25/2023 0706   PHURINE 5.0 11/25/2023 0706   GLUCOSEU NEGATIVE 11/25/2023 0706   GLUCOSEU NEGATIVE 10/22/2023 0831   HGBUR NEGATIVE 11/25/2023 0706   BILIRUBINUR NEGATIVE 11/25/2023 0706   KETONESUR NEGATIVE 11/25/2023 0706   PROTEINUR NEGATIVE 11/25/2023 0706   UROBILINOGEN 0.2 10/22/2023 0831   NITRITE NEGATIVE 11/25/2023 0706   LEUKOCYTESUR NEGATIVE 11/25/2023 0706   Sepsis Labs Recent Labs  Lab 11/24/23 2206 11/25/23 0442  WBC 4.9 5.5   Microbiology No results found for this or any previous visit (from the past 240 hours).   Time coordinating discharge: 28 minutes  SIGNED:   Renato Applebaum, MD  [facility] 11/25/2023, 3:57 PM

## 2023-11-25 NOTE — Telephone Encounter (Signed)
 Patient Product/process development scientist completed.    The patient is insured through [employer]. Patient has Medicare and is not eligible for a copay card, but may be able to apply for patient assistance or Medicare RX Payment Plan (Patient Must reach out to their plan, if eligible for payment plan), if available.    Ran test claim for Eliquis 5 mg and the current 30 day co-pay is $541.02 due to a $590.00 deductible.  Ran test claim for Xarelto 20 mg and the current 30 day co-pay is $539.09 due to a $590.00 deductible.  This test claim was processed through [facility]- copay amounts may vary at other pharmacies due to pharmacy/plan contracts, or as the patient moves through the different stages of their insurance plan.     Jared Alvarez, CPHT Pharmacy Technician III Certified Patient Advocate [facility] Patient Advocate Team Direct Number: [phone]  Fax: [phone]

## 2023-11-25 NOTE — ED Provider Notes (Signed)
 I provided a substantive portion of the care of this patient.  I personally made/approved the management plan for this patient and take responsibility for the patient management.   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 Found to be in afib here. Creatinine above baseline. otherwise feels well. Back in sinus rhythm prior to my evlauation.   CRITICAL CARE Performed by: Selinda Sias Total critical care time: 35 minutes Critical care time was exclusive of separately billable procedures and treating other patients. Critical care was necessary to treat or prevent imminent or life-threatening deterioration. Critical care was time spent personally by me on the following activities: development of treatment plan with patient and/or surrogate as well as nursing, discussions with consultants, evaluation of patient's response to treatment, examination of patient, obtaining history from patient or surrogate, ordering and performing treatments and interventions, ordering and review of laboratory studies, ordering and review of radiographic studies, pulse oximetry and re-evaluation of patient's condition.   EKG Interpretation Date/Time:  Wednesday November 24 2023 21:57:19 EDT Ventricular Rate:  73 PR Interval:    QRS Duration:  104 QT Interval:  385 QTC Calculation: 425 R Axis:   51  Text Interpretation: Atrial fibrillation Borderline T wave abnormalities Confirmed by Sias Selinda [phone]) on 11/24/2023 11:24:09 PM    Shalene Gallen, Selinda, MD 11/25/23 [phone]

## 2023-11-25 NOTE — Discharge Instructions (Signed)
-  avoid dehydration. Often it requires high volumes of fluids, often with salt/electrolytes included, to stay hydrated.  -if tolerated, compression stocking can assist with fluid management and prevent pooling in the legs. -slow position changes are recommended -if there is a feeling of severe lightheadedness, like near to passing out, recommend lying on the floor on the back, with legs elevated up on a chair or up against the wall.

## 2023-11-25 NOTE — ED Notes (Addendum)
 Orthostatic blood pressures performed and documented. Patient remained asymptomatic. Patient temporarily off monitor to ambulate to bathroom with steady gait and SBA

## 2023-11-25 NOTE — Progress Notes (Signed)
 Patient seen and examined in the morning rounds at the emergency room.  He had no complaints today.  Telemetry monitor shows sinus rhythm with a heart rate well-controlled.  Admitted early morning hours by nighttime hospitalist.  See H&P assessment plan for details.  Syncopal episode in the context of prolonged standing, possible orthostatic New onset A-fib with controlled rate Recurrent syncope, very sporadic 1 or twice in a year.  Previously investigated in 2019 with a Holter monitor for possible bradycardia.  Plan: Patient remains quite stable today.  Orthostatics were negative. Sinus rhythm.  Started on heparin  overnight. 2D echocardiogram pending. TSH not done, will add to previous lab. Given patient's significant syncope causing fall patient may benefit with loop recorder for prolonged monitoring. Recommended thigh-high compression socks mostly when out of bed or performing and standing. Possible discharge later today if stable. Magnesium  was very low, he had received 2 g of magnesium .  Will give him 2 additional gram of magnesium .  Will send home with oral magnesium  oxide.   Same-day admit.  No charge visit.  Possible discharge if cleared by cardiology.

## 2023-11-25 NOTE — Progress Notes (Signed)
 Echocardiogram 2D Echocardiogram has been performed.  Jared Alvarez 11/25/2023, 3:57 PM

## 2023-11-25 NOTE — Consult Note (Signed)
 Cardiology Consultation  Patient ID: Jared Alvarez MRN: [id number]; DOB: 27-Jan-1951  Admit date: 11/24/2023 Date of Consult: 11/25/2023  PCP:  Garald Karlynn GAILS, MD    [facility]Care Providers Cardiologist:  Maude Emmer, MD     Patient Profile: Jared Alvarez is a 73 y.o. male with a hx of CAD s/p stent to LAD (1997 in [place] ), hypertension, hyperlipidemia, type 2 diabetes, anemia of chronic disease, history of PVCs, nocturnal bradycardia noted on previous monitor who is being seen 11/25/2023 for the evaluation of syncope, new atrial fibrillation at the request of Dr. Raenelle.  History of Present Illness: [name] has past medical history as stated above.  He presented to the [facility] emergency department on 11/24/2023 complaining of syncope.  He is a bass player, who was playing music downtown when he had a witnessed syncopal episode, lasting about 5 to 10 seconds.  Patient reported that while he was playing the last song, he felt lightheaded and then subsequently found himself on the ground.  He does report that he was standing for prolonged period of time, saying the concert lasted about 3 hours. He reports being able to get up on his own, denies any significant confusion.  When presenting to the emergency department he just endorsed a mild headache, mild tenderness to his left upper arm.  He reports that he felt fine in the day prior to the event, eating and drinking normally.  He reports a similar episode of dizziness/lightheadedness that occurred approximately one month prior while he was working for a prolonged period of time on his deck, while it was very hot outside.  During this episode however he did not have a formal loss of consciousness.  Relevant workup while in the ED includes: EKG shows atrial fibrillation, HR 73, CBC fairly stable with chronic anemia, BMP shows elevated creatinine at 1.30 > 1.09 after IV fluids, left knee XR showed no fracture/dislocation,  left humerus XR showed acute inferiorly displaced left distal clavicular fracture, head CT showed no acute intracranial abnormality with degenerative changes of C-spine,CXR showed cardiomegaly but no acute findings, UA normal, UDS negative.  He was given IV fluids, started on IV heparin  after being found in atrial fibrillation, electrolytes have been replenished.  He was admitted to the medicine service and cardiology was asked to consult in the setting of recurrent syncope, new atrial fibrillation.  He is a patient of Dr. Emmer who was last seen 08/18/2023 for follow up.  At this appointment he was doing well, reported a few vagal like episodes with low blood pressure.  He reported that he was taking some new vasodilators for his prostate which could have been contributing to his symptoms.  His medications at this time included: ASA 81 mg daily, Crestor  20 mg daily, BP meds were discontinued in the setting of hypotension, BP at this visit was 118/62.  After speaking with the patient, he tells me that he has always had a low HR with his resting typically being around 50s and going down to the 30s when he sleeps. This has been since as long as he can remember. He reports that these syncopal or near syncopal events have only been occurring in the past few years. He tells me that he always has a prodrome of dizziness/lightheadedness and he can feel them coming.  He admits that he does not typically have much time between feeling it coming on and knowing he is going to potentially pass out. Almost all of his  previous episodes have been when outside, in the heat, doing yard work or under other instances where he is dehydrated. 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 He tells me that he felt it coming on, but because he was in the middle of performing he tried to work thru it. He then stopped playing and ended up having a syncopal episode.  Past Medical History:  Diagnosis Date    Allergy    Anxiety    no per pt   Aortic aneurysm (HCC)    Coronary artery disease    Depression    no per pt   DM type 2 (diabetes mellitus, type 2) (HCC)    GERD (gastroesophageal reflux disease)    Gout    Hearing loss    Hyperlipidemia    Hypertension    Hypogonadism male    Inguinal hernia    right   LBP (low back pain)    Leg cramps    Osteoarthritis    Skin neoplasm    Tinnitus    since 1997   Vertigo    Vitamin B12 deficiency    Past Surgical History:  Procedure Laterality Date   COLONOSCOPY     FOOT SURGERY     07-2019 triple bone fusion   FOOT SURGERY  08/2020   repeat fusion   heart stent     in 1997   INGUINAL HERNIA REPAIR     POLYPECTOMY      Home Medications:  Prior to Admission medications   Medication Sig Start Date End Date Taking? Authorizing Provider  acetaminophen  (TYLENOL ) 500 MG tablet Take 1 tablet (500 mg total) by mouth every 6 (six) hours as needed. 07/25/21   Plotnikov, Aleksei V, MD  alfuzosin (UROXATRAL) 10 MG 24 hr tablet Take 10 mg by mouth daily. 10/01/22   [provider]  aspirin  81 MG tablet Take 1 tablet (81 mg total) by mouth daily. 06/23/17   Plotnikov, Karlynn GAILS, MD  ciclopirox  (PENLAC ) 8 % solution APPLY TOPICALLY AT BEDTIME OVER NAIL AND SURROUNDING SKIN. APPLY DAILY OVER PREVIOUS COAT. AFTER SEVEN 7 DAYS, MAY REMOVE WITH ALCOHOL AND CONTINUE CYCLE. 08/04/23   Plotnikov, Aleksei V, MD  colchicine  0.6 MG tablet Take 1 tablet (0.6 mg total) by mouth 2 (two) times daily. If patient has flare up with gout 10/28/20   Corey, Evan S, MD  Cyanocobalamin  (VITAMIN B-12) 500 MCG SUBL Place 1 tablet (500 mcg total) under the tongue daily. 06/23/17   Plotnikov, Aleksei V, MD  Efinaconazole  (JUBLIA ) 10 % SOLN Apply 1 application  topically every morning. Patient not taking: Reported on 10/26/2023 10/07/22   Plotnikov, Karlynn GAILS, MD  FLUoxetine  (PROZAC ) 40 MG capsule TAKE 1 CAPSULE (40 MG TOTAL) BY MOUTH DAILY. 07/14/23   Plotnikov, Aleksei V,  MD  Homeopathic Products (ARNICARE ARNICA) OINT Apply 1 application topically 2 (two) times daily as needed (arthritis).    [provider]  HYDROcodone  bit-homatropine (HYCODAN) 5-1.5 MG/5ML syrup Take 5 mLs by mouth every 8 (eight) hours as needed for cough. Patient not taking: Reported on 10/26/2023 04/30/23   Plotnikov, Aleksei V, MD  ibuprofen (ADVIL) 200 MG tablet Ibuprofen cream PRN    [provider]  IBUPROFEN-LINIMENT CO by Combination route.    [provider]  LORazepam  (ATIVAN ) 1 MG tablet Take 0.5-1 tablets (0.5-1 mg total) by mouth 2 (two) times daily as needed for anxiety. 03/16/23   Plotnikov, Karlynn GAILS, MD  losartan  (COZAAR ) 100 MG tablet TAKE 1 TABLET BY MOUTH EVERY DAY 04/13/23   Plotnikov, Aleksei V, MD  metFORMIN  (GLUCOPHAGE ) 1000 MG tablet TAKE ONE TABLET BY MOUTH TWICE A DAY WITH A MEAL 07/14/23   Plotnikov, Aleksei V, MD  pantoprazole  (PROTONIX ) 40 MG tablet TAKE 1 TABLET BY MOUTH TWICE A DAY 10/11/23   Plotnikov, Aleksei V, MD  rosuvastatin  (CRESTOR ) 20 MG tablet Take 1 tablet (20 mg total) by mouth 3 (three) times a week. Mon, Wed, and Fri 10/27/23   Plotnikov, Karlynn GAILS, MD  tadalafil  (CIALIS ) 5 MG tablet Take 1 tablet by mouth once daily 09/03/23   Plotnikov, Aleksei V, MD  triamcinolone  (NASACORT ) 55 MCG/ACT AERO nasal inhaler Place 2 sprays into the nose daily. 01/30/21   Norleen Lynwood ORN, MD  triamcinolone  cream (KENALOG ) 0.1 % Apply 1 application topically 2 (two) times daily. 04/08/21   Rollene Almarie LABOR, MD   Scheduled Meds:  insulin  aspart  0-9 Units Subcutaneous TID WC   [START ON 11/26/2023] rosuvastatin   20 mg Oral Once per day on Monday Wednesday Friday   Continuous Infusions:  heparin  1,100 Units/hr (11/25/23 1008)   PRN Meds: acetaminophen  **OR** acetaminophen , LORazepam , melatonin, ondansetron  (ZOFRAN ) IV  Allergies:    Allergies  Allergen Reactions   Atorvastatin     REACTION: pains   Bupropion Hcl     REACTION: side effects    Doxazosin      Dizzy, syncope    Duloxetine     REACTION: dizzy   Fluticasone     Other reaction(s): other/intolerance   Nexium  [Esomeprazole  Magnesium ]     Rash on legs   Simvastatin     REACTION: pain   Venlafaxine     REACTION: side effects   Viibryd  [Vilazodone  Hcl]     nightmares   Vilazodone      Other reaction(s): psychological reaction Nightmares    Ketorolac  Palpitations   Tamsulosin  Palpitations   Social History:   Social History   Socioeconomic History   Marital status: Married    Spouse name: Not on file   Number of children: Not on file   Years of education: Not on file   Highest education level: Not on file  Occupational History   Occupation: United Engineer, maintenance    Employer: [employer]  Tobacco Use   Smoking status: Never   Smokeless tobacco: Never  Vaping Use   Vaping status: Never Used  Substance and Sexual Activity   Alcohol use: Yes    Alcohol/week: 7.0 standard drinks of alcohol    Types: 7 Glasses of wine per week    Comment: socially   Drug use: No   Sexual activity: Yes  Other Topics Concern   Not on file  Social History Narrative   Not on file   Social Drivers of Health   Financial Resource Strain: Low Risk  (02/05/2021)   Overall Financial Resource Strain (CARDIA)    Difficulty of Paying Living Expenses: Not hard at all  Food Insecurity: No Food Insecurity (02/05/2021)   Hunger Vital Sign    Worried About Running Out of Food in the Last Year: Never true    Ran Out of Food in the Last Year: Never true  Transportation Needs: No Transportation Needs (02/05/2021)   PRAPARE - Administrator, Civil Service (Medical): No    Lack of Transportation (Non-Medical): No  Physical Activity: Sufficiently Active (02/05/2021)   Exercise Vital Sign    Days of Exercise per  Week: 7 days    Minutes of Exercise per Session: 30 min  Stress: No Stress Concern Present (02/05/2021)   [employer] of Occupational Health -  Occupational Stress Questionnaire    Feeling of Stress : Not at all  Social Connections: Unknown (02/05/2021)   Social Connection and Isolation Panel    Frequency of Communication with Friends and Family: More than three times a week    Frequency of Social Gatherings with Friends and Family: More than three times a week    Attends Religious Services: Patient declined    Database administrator or Organizations: Patient declined    Attends Banker Meetings: Patient declined    Marital Status: Married  Catering manager Violence: Not At Risk (02/05/2021)   Humiliation, Afraid, Rape, and Kick questionnaire    Fear of Current or Ex-Partner: No    Emotionally Abused: No    Physically Abused: No    Sexually Abused: No    Family History:   Family History  Problem Relation Age of Onset   Heart disease Father    Arthritis Sister    Lupus Sister    Heart disease Brother    CVA Brother    Coronary artery disease Other    Hypertension Other    Colon cancer Neg Hx    Esophageal cancer Neg Hx    Stomach cancer Neg Hx    Rectal cancer Neg Hx    Colon polyps Neg Hx     ROS:  Please see the history of present illness.  All other ROS reviewed and negative.     Physical Exam/Data: Vitals:   11/25/23 0950 11/25/23 0951 11/25/23 0953 11/25/23 1241  BP: (!) 159/83 (!) 167/76 (!) 153/79 (!) 168/83  Pulse: (!) 58 (!) 56 (!) 58   Resp: 16 12 15 17   Temp:    98.2 F (36.8 C)  TempSrc:    Oral  SpO2: 100% 100% 100%   Weight:    77.4 kg  Height:    5' 8 (1.727 m)    Intake/Output Summary (Last 24 hours) at 11/25/2023 1441 Last data filed at 11/25/2023 0959 Gross per 24 hour  Intake 1300 ml  Output --  Net 1300 ml      11/25/2023   12:41 PM 11/24/2023    9:53 PM 10/26/2023   10:28 AM  Last 3 Weights  Weight (lbs) 170 lb 9.6 oz 166 lb 168 lb  Weight (kg) 77.384 kg 75.297 kg 76.204 kg     Body mass index is 25.94 kg/m.   General:  Well nourished, well developed, in no  acute distress HEENT: normal Neck: no JVD Vascular: Distal pulses 2+ bilaterally Cardiac:  normal S1, S2; RRR; no murmur  Lungs:  clear to auscultation bilaterally, no wheezing, rhonchi or rales  Abd: soft, nontender, no hepatomegaly  Ext: no edema Musculoskeletal:  No deformities Skin: warm and dry  Neuro: no focal abnormalities noted Psych:  Normal affect   EKG:  The EKG was personally reviewed and demonstrates:  atrial fibrillation, HR 76  Telemetry:  Telemetry was personally reviewed and demonstrates:  sinus bradycardia, HR 50-60s  Relevant CV Studies:  Echocardiogram, 11/25/2023 Ordered, pending results   Holter monitor, 01/06/2018 Basic rhythm is normal sinus rhythm Instances of bradycardia into the low 40s, occasionally during times when the patient should be awake. No symptoms reported Frequent PVCs with longest run 6 beats. PVC burden 12%. No atrial fibrillation. Occasional PACs with rare supraventricular run No  correlation between complaints and rhythm disturbance. HEART RATE EPISODES Minimum HR: 35 BPM at 8:40:20 AM Maximum HR: 129 BPM at 7:21:42 PM Average HR: 73 BPM  Stress test, 01/27/2018 The left ventricular ejection fraction is moderately decreased (30-44%). Nuclear stress EF: 42%. ST segment depression of 2 mm was noted during stress in the II, III, aVF, V5 and V6 leads. This is an intermediate risk study.   ECG positive for ischemia with 2 mm ST depression and T wave inversion inferior lateral leads with frequent PVCls Perfusion images are normal with no ischemia or infarct EF estimated at at 42% with diffuse hypokinesis but may Be affected by frequent PVCls   CT Angio Chest, 05/21/2023 No evidence of thoracic aortic aneurysm or dissection. The prior measurement of 4.2 cm in the ascending thoracic aorta from the 2019 study was likely exaggerated due to blurring of the aortic margins due to cardiac motion artifact. The aorta is precisely measured on today's  gated study at a maximum of 3.8 cm which is nonaneurysmal. Coronary artery atherosclerotic calcifications. Aortic atherosclerosis.  Laboratory Data: High Sensitivity Troponin:  No results for input(s): TROPONINIHS in the last 720 hours.   Chemistry Recent Labs  Lab 11/24/23 2206 11/25/23 0442  NA 137 134*  K 3.5 3.9  CL 107 106  CO2 19* 18*  GLUCOSE 150* 184*  BUN 28* 25*  CREATININE 1.30* 1.09  CALCIUM  8.9 8.5*  MG  --  1.2*  GFRNONAA 58* >60  ANIONGAP 11 10    Recent Labs  Lab 11/25/23 0442  PROT 5.1*  ALBUMIN 3.0*  AST 15  ALT 10  ALKPHOS 78  BILITOT 0.6   Lipids No results for input(s): CHOL, TRIG, HDL, LABVLDL, LDLCALC, CHOLHDL in the last 168 hours.  Hematology Recent Labs  Lab 11/24/23 2206 11/25/23 0442  WBC 4.9 5.5  RBC 3.43* 3.04*  HGB 11.0* 9.8*  HCT 32.8* 28.4*  MCV 95.6 93.4  MCH 32.1 32.2  MCHC 33.5 34.5  RDW 13.2 13.2  PLT 227 172   Thyroid   Recent Labs  Lab 11/25/23 0442  TSH 1.559    BNPNo results for input(s): BNP, PROBNP in the last 168 hours.  DDimer  Recent Labs  Lab 11/25/23 1007  DDIMER 0.76*   Radiology/Studies:  DG Chest Port 1 View Result Date: 11/25/2023 CLINICAL DATA:  106001.  Syncope.  Fell. EXAM: PORTABLE CHEST 1 VIEW COMPARISON:  Chest PA Lat 01/09/2023. FINDINGS: There is mild cardiomegaly. No evidence of CHF. Stable mediastinum with aortic calcific plaques and tortuosity. The lungs are clear. There is no substantial pleural effusion. Thoracic spondylosis. The bones are demineralized.  No acute osseous findings are seen AP. IMPRESSION: 1. No acute radiographic chest findings. 2. Cardiomegaly. 3. Aortic atherosclerosis. 4. Osteopenia. Electronically Signed   By: Francis Quam M.D.   On: 11/25/2023 07:15   CT Head Wo Contrast Result Date: 11/24/2023 CLINICAL DATA:  Syncope EXAM: CT HEAD WITHOUT CONTRAST CT CERVICAL SPINE WITHOUT CONTRAST TECHNIQUE: Multidetector CT imaging of the head and cervical  spine was performed following the standard protocol without intravenous contrast. Multiplanar CT image reconstructions of the cervical spine were also generated. RADIATION DOSE REDUCTION: This exam was performed according to the departmental dose-optimization program which includes automated exposure control, adjustment of the mA and/or kV according to patient size and/or use of iterative reconstruction technique. COMPARISON:  None Available. FINDINGS: CT HEAD FINDINGS Brain: No acute territorial infarction, hemorrhage, or intracranial mass. Mild atrophy. The ventricles are nonenlarged Vascular: No  hyperdense vessels.  Carotid vascular calcification Skull: Normal. Negative for fracture or focal lesion. Sinuses/Orbits: No acute finding. Other: None CT CERVICAL SPINE FINDINGS Alignment: Trace anterolisthesis C2 on C3. Trace retrolisthesis C4 on C5 and trace anterolisthesis C6 on C7. Facet alignment is normal Skull base and vertebrae: No acute fracture. No primary bone lesion or focal pathologic process. Soft tissues and spinal canal: No prevertebral fluid or swelling. No visible canal hematoma. Disc levels: Advanced disc space narrowing and degenerative change C3 through C7. Advanced C1-C2 degenerative change. Multilevel facet degenerative change with foraminal narrowing Upper chest: Negative. Other: None IMPRESSION: 1. No CT evidence for acute intracranial abnormality. Mild atrophy. 2. Degenerative changes of the cervical spine. No acute osseous abnormality. Electronically Signed   By: Luke Bun M.D.   On: 11/24/2023 22:58   CT Cervical Spine Wo Contrast Result Date: 11/24/2023 CLINICAL DATA:  Syncope EXAM: CT HEAD WITHOUT CONTRAST CT CERVICAL SPINE WITHOUT CONTRAST TECHNIQUE: Multidetector CT imaging of the head and cervical spine was performed following the standard protocol without intravenous contrast. Multiplanar CT image reconstructions of the cervical spine were also generated. RADIATION DOSE REDUCTION:  This exam was performed according to the departmental dose-optimization program which includes automated exposure control, adjustment of the mA and/or kV according to patient size and/or use of iterative reconstruction technique. COMPARISON:  None Available. FINDINGS: CT HEAD FINDINGS Brain: No acute territorial infarction, hemorrhage, or intracranial mass. Mild atrophy. The ventricles are nonenlarged Vascular: No hyperdense vessels.  Carotid vascular calcification Skull: Normal. Negative for fracture or focal lesion. Sinuses/Orbits: No acute finding. Other: None CT CERVICAL SPINE FINDINGS Alignment: Trace anterolisthesis C2 on C3. Trace retrolisthesis C4 on C5 and trace anterolisthesis C6 on C7. Facet alignment is normal Skull base and vertebrae: No acute fracture. No primary bone lesion or focal pathologic process. Soft tissues and spinal canal: No prevertebral fluid or swelling. No visible canal hematoma. Disc levels: Advanced disc space narrowing and degenerative change C3 through C7. Advanced C1-C2 degenerative change. Multilevel facet degenerative change with foraminal narrowing Upper chest: Negative. Other: None IMPRESSION: 1. No CT evidence for acute intracranial abnormality. Mild atrophy. 2. Degenerative changes of the cervical spine. No acute osseous abnormality. Electronically Signed   By: Luke Bun M.D.   On: 11/24/2023 22:58   DG Humerus Left Result Date: 11/24/2023 CLINICAL DATA:  fall EXAM: LEFT HUMERUS - 2+ VIEW COMPARISON:  CTA chest 05/21/2023 FINDINGS: Acute inferiorly displaced distal clavicular fracture. Severe degenerative changes of the acromioclavicular joint. No acute displaced fracture or dislocation of the humerus. Elbow is grossly unremarkable. Shoulder otherwise unremarkable. Soft tissues are unremarkable. IMPRESSION: Acute inferiorly displaced left distal clavicular fracture. Electronically Signed   By: Morgane  Naveau M.D.   On: 11/24/2023 22:35   DG Knee Complete 4 Views  Left Result Date: 11/24/2023 CLINICAL DATA:  fall EXAM: LEFT KNEE - COMPLETE 4+ VIEW COMPARISON:  X-ray left knee 02/17/2018 FINDINGS: No evidence of fracture, dislocation, or joint effusion. Mild tricompartmental degenerative changes of the knee with associated chondrocalcinosis. No aggressive appearing focal osseous lesion. Soft tissues are unremarkable. Vascular calcifications. IMPRESSION: No acute displaced fracture or dislocation. Electronically Signed   By: Morgane  Naveau M.D.   On: 11/24/2023 22:31   Assessment and Plan:  Recurrent syncope Patient has history of recurrent syncope since he was a teenager Presented to the emergency department after having a witnessed syncopal episode while playing the bass guitar Orthostatic vital signs negative UA normal UDS negative On arrival magnesium  was 1.2, supplemented  Holter monitor from 2019 showed sinus rhythm, no A. Fib, some bradycardia, frequent PVCs Pending echocardiogram -- if echo returns as normal, would likely be able to discharge home today  Plan for live cardiac monitor to be placed prior to discharge for syncope and to monitor for A. Fib recurrence   New onset atrial fibrillation History of bradycardia No known prior history of atrial fibrillation Holter monitor from 01/2018 noted no A. Fib  EKG in emergency department showed atrial fibrillation, HR 76 Patient was started on IV heparin  while admitted  Currently in sinus bradycardia with HR 50s  Patient reports his resting HR is typically in the 50s with drops into the 30s at night  Pending echocardiogram  Plan for live cardiac monitor as above  In terms of anticoagulation, Eliquis was found to be $540 with patient's insurance, would recommend generic dabigatran 150 mg BID, using GoodRx (at outside pharmacy) as it seems to be a more affordable option  Patient currently wanting to defer anticoagulation at this time, he is going to speak to his wife prior to making final decision,  but wanting to defer at this time and wait for his monitor results. Educated on the potential risks by deferring [facility], patient is understanding   Acute displaced clavicular fracture, left Decently displaced fracture on XR Not currently reporting any pain with palpation Patient reports he was given a sling but has not been using it because it caused pain Patient would likely require sling/shoulder immobilizer and go to ortho as outpatient Defer to primary team   Per primary Acute displaced clavicular fracture Electrolyte abnormalities Anemia of chronic disease CAD Hypertension Hyperlipidemia Diabetes  Risk Assessment/Risk Scores:      CHA2DS2-VASc Score = 4   This indicates a 4.8% annual risk of stroke. The patient's score is based upon: CHF History: 0 HTN History: 1 Diabetes History: 1 Stroke History: 0 Vascular Disease History: 1 Age Score: 1 Gender Score: 0       For questions or updates, please contact [facility] Please consult [email or web address] for contact info under    Signed, Waddell DELENA Donath, PA-C  11/25/2023 2:41 PM

## 2023-11-25 NOTE — TOC Transition Note (Signed)
 Transition of Care [facility]) - Discharge Note   Patient Details  Name: Jared Alvarez MRN: [id number] Date of Birth: [birth year]  [facility] of [facility]) CM/SW Contact:  Andrez JULIANNA George, RN Phone Number: 11/25/2023, 4:23 PM   Clinical Narrative:     Pt is discharging home with self care. No needs per TOC.   Final next level of care: Home/Self Care Barriers to Discharge: No Barriers Identified   Patient Goals and CMS Choice            Discharge Placement                       Discharge Plan and Services Additional resources added to the After Visit Summary for                                       Social Drivers of Health (SDOH) Interventions SDOH Screenings   Food Insecurity: No Food Insecurity (11/25/2023)  Housing: Low Risk  (11/25/2023)  Transportation Needs: No Transportation Needs (11/25/2023)  Utilities: Not At Risk (11/25/2023)  Alcohol Screen: Low Risk  (02/05/2021)  Depression (PHQ2-9): Low Risk  (10/26/2023)  Financial Resource Strain: Low Risk  (02/05/2021)  Physical Activity: Sufficiently Active (02/05/2021)  Social Connections: Moderately Isolated (11/25/2023)  Stress: No Stress Concern Present (02/05/2021)  Tobacco Use: Low Risk  (11/25/2023)     Readmission Risk Interventions     No data to display

## 2023-11-26 ENCOUNTER — Inpatient Hospital Stay

## 2023-11-26 ENCOUNTER — Ambulatory Visit (HOSPITAL_BASED_OUTPATIENT_CLINIC_OR_DEPARTMENT_OTHER): Admitting: Physician Assistant

## 2023-11-26 ENCOUNTER — Encounter (HOSPITAL_BASED_OUTPATIENT_CLINIC_OR_DEPARTMENT_OTHER): Payer: Self-pay | Admitting: Physician Assistant

## 2023-11-26 ENCOUNTER — Inpatient Hospital Stay: Admitting: Physician Assistant

## 2023-11-26 ENCOUNTER — Ambulatory Visit (HOSPITAL_BASED_OUTPATIENT_CLINIC_OR_DEPARTMENT_OTHER)

## 2023-11-26 DIAGNOSIS — S42032D Displaced fracture of lateral end of left clavicle, subsequent encounter for fracture with routine healing: Secondary | ICD-10-CM

## 2023-11-26 DIAGNOSIS — M25512 Pain in left shoulder: Secondary | ICD-10-CM

## 2023-11-26 DIAGNOSIS — S42032A Displaced fracture of lateral end of left clavicle, initial encounter for closed fracture: Secondary | ICD-10-CM | POA: Diagnosis not present

## 2023-11-26 DIAGNOSIS — R55 Syncope and collapse: Secondary | ICD-10-CM | POA: Diagnosis not present

## 2023-11-26 DIAGNOSIS — G8929 Other chronic pain: Secondary | ICD-10-CM

## 2023-11-26 NOTE — Progress Notes (Signed)
 Office Visit Note   Patient: Jared Alvarez           Date of Birth: [birth year]           MRN: [id number] Visit Date: 11/26/2023              Requested by: Garald Karlynn GAILS, MD [address] [place],  [place] [address] PCP: Plotnikov, Karlynn GAILS, MD   Assessment & Plan: Visit Diagnoses:  1. Chronic left shoulder pain   2. Traumatic closed displaced fracture of acromial end of clavicle with routine healing, left     Plan: Patient is a pleasant 73 year old gentleman who had a syncopal episode last couple days was seen in the emergency room.  He had a episode of A-fib.  During that time he complained of left shoulder pain x-ray there demonstrated a distal left clavicle fracture.  He is really not having very much pain today.  I did take dedicated clavicle films today he does have a displaced fracture at the distal clavicle along with significant degenerative changes of the acromioclavicular joint.  Based on his presentation today I will treat this conservatively he has been advised not to pick up any heavy objects to be careful when he is playing his guitar would like to see him back in 2 weeks for new x-rays  Follow-Up Instructions: Return in about 2 weeks (around 12/10/2023).   Orders:  Orders Placed This Encounter  Procedures   DG Clavicle Left   No orders of the defined types were placed in this encounter.     Procedures: No procedures performed   Clinical Data: No additional findings.   Subjective: No chief complaint on file.   HPI Patient is a pleasant 73 year old gentleman follows up today referral for evaluation of left clavicle fracture.  No previous history but he does admit he is always had some pain in his shoulders although the right one was more significant than the left. Review of Systems  All other systems reviewed and are negative.    Objective: Vital Signs: There were no vitals taken for this visit.  Physical Exam Constitutional:       Appearance: Normal appearance.  Pulmonary:     Effort: Pulmonary effort is normal.  Skin:    General: Skin is warm and dry.  Neurological:     General: No focal deficit present.     Mental Status: He is alert.     Ortho Exam Examination of his left shoulder he has some ecchymosis.  No evidence of any skin breakdown or skin tenting.  He is focally tender over the acromioclavicular joint.  He has full forward elevation and internal rotation behind his back neurosensory he is intact he has good grip strength good extension and flexion of his elbow Specialty Comments:  No specialty comments available.  Imaging: ECHOCARDIOGRAM COMPLETE Result Date: 11/25/2023    ECHOCARDIOGRAM REPORT   Patient Name:   Jared Alvarez Date of Exam: 11/25/2023 Medical Rec #:  [id number]        Height:       68.0 in Accession #:    [id number]       Weight:       170.6 lb Date of Birth:  June 27, 1950        BSA:          1.910 m Patient Age:    73 years         BP:  153/79 mmHg Patient Gender: M                HR:           59 bpm. Exam Location:  Inpatient Procedure: 2D Echo, Cardiac Doppler and Color Doppler (Both Spectral and Color            Flow Doppler were utilized during procedure). Indications:    Syncope R55  History:        Patient has prior history of Echocardiogram examinations, most                 recent 01/06/2018. Arrythmias:Atrial Fibrillation and Bradycardia,                 Signs/Symptoms:Hypotension, Syncope and Chest Pain; Risk                 Factors:Hypertension, Diabetes and Dyslipidemia.  Sonographer:    Thea Norlander RCS Referring Phys: [id number] JUSTIN B HOWERTER IMPRESSIONS  1. Left ventricular ejection fraction, by estimation, is 55%. The left ventricle has normal function. The left ventricle has no regional wall motion abnormalities. Left ventricular diastolic parameters are consistent with Grade I diastolic dysfunction (impaired relaxation).  2. Right ventricular systolic function is  normal. The right ventricular size is normal. Tricuspid regurgitation signal is inadequate for assessing PA pressure.  3. Left atrial size was mildly dilated.  4. The mitral valve is normal in structure. Mild mitral valve regurgitation. No evidence of mitral stenosis.  5. The aortic valve is tricuspid. There is mild calcification of the aortic valve. Aortic valve regurgitation is mild. No aortic stenosis is present.  6. Aortic dilatation noted. There is mild dilatation of the ascending aorta, measuring 41 mm.  7. The IVC was not visualized. FINDINGS  Left Ventricle: Left ventricular ejection fraction, by estimation, is 55%. The left ventricle has normal function. The left ventricle has no regional wall motion abnormalities. The left ventricular internal cavity size was normal in size. There is no left ventricular hypertrophy. Left ventricular diastolic parameters are consistent with Grade I diastolic dysfunction (impaired relaxation). Right Ventricle: The right ventricular size is normal. No increase in right ventricular wall thickness. Right ventricular systolic function is normal. Tricuspid regurgitation signal is inadequate for assessing PA pressure. Left Atrium: Left atrial size was mildly dilated. Right Atrium: Right atrial size was normal in size. Pericardium: There is no evidence of pericardial effusion. Mitral Valve: The mitral valve is normal in structure. Mild mitral valve regurgitation. No evidence of mitral valve stenosis. Tricuspid Valve: The tricuspid valve is normal in structure. Tricuspid valve regurgitation is trivial. Aortic Valve: The aortic valve is tricuspid. There is mild calcification of the aortic valve. Aortic valve regurgitation is mild. No aortic stenosis is present. Aortic valve peak gradient measures 10.0 mmHg. Pulmonic Valve: The pulmonic valve was normal in structure. Pulmonic valve regurgitation is trivial. Aorta: The aortic root is normal in size and structure and aortic dilatation  noted. There is mild dilatation of the ascending aorta, measuring 41 mm. Venous: The IVC was not visualized. IAS/Shunts: No atrial level shunt detected by color flow Doppler.  LEFT VENTRICLE PLAX 2D LVIDd:         4.80 cm   Diastology LVIDs:         3.60 cm   LV e' medial:    10.00 cm/s LV PW:         1.10 cm   LV E/e' medial:  8.1 LV IVS:  0.80 cm   LV e' lateral:   14.10 cm/s LVOT diam:     2.20 cm   LV E/e' lateral: 5.8 LV SV:         90 LV SV Index:   47 LVOT Area:     3.80 cm  RIGHT VENTRICLE RV S prime:     12.70 cm/s TAPSE (M-mode): 2.1 cm LEFT ATRIUM           Index        RIGHT ATRIUM           Index LA diam:      4.10 cm 2.15 cm/m   RA Area:     16.10 cm LA Vol (A2C): 39.2 ml 20.52 ml/m  RA Volume:   34.50 ml  18.06 ml/m LA Vol (A4C): 66.5 ml 34.81 ml/m  AORTIC VALVE AV Area (Vmax): 2.57 cm AV Vmax:        158.50 cm/s AV Peak Grad:   10.0 mmHg LVOT Vmax:      107.00 cm/s LVOT Vmean:     67.700 cm/s LVOT VTI:       0.238 m  AORTA Ao Root diam: 3.50 cm Ao Asc diam:  4.10 cm MITRAL VALVE MV Area (PHT): 3.48 cm    SHUNTS MV Decel Time: 218 msec    Systemic VTI:  0.24 m MV E velocity: 81.20 cm/s  Systemic Diam: 2.20 cm MV A velocity: 71.30 cm/s MV E/A ratio:  1.14 Dalton McleanMD Electronically signed by Ezra Kanner Signature Date/Time: 11/25/2023/5:19:20 PM    Final      PMFS History: Patient Active Problem List   Diagnosis Date Noted   Traumatic closed displaced fracture of acromial end of clavicle with routine healing, left 11/26/2023   Syncope 11/25/2023   New onset atrial fibrillation (HCC) 11/25/2023   AKI (acute kidney injury) (HCC) 11/25/2023   Closed left clavicular fracture 11/25/2023   Hypomagnesemia 11/25/2023   History of essential hypertension 11/25/2023   Coronary artery disease involving native coronary artery of native heart without angina pectoris 11/25/2023   Syncope and collapse 11/25/2023   Abnormal CBC 10/26/2023   Sleep disorder 04/06/2023   Bladder-neck  obstruction 02/26/2022   Hematospermia 10/07/2021   Orthostatic hypotension 07/23/2021   Somnolence, daytime 07/23/2021   Contact dermatitis due to plants, except food 04/09/2021   Elevated PSA 01/30/2021   Anemia of chronic disease 10/15/2020   Arthritis of foot 08/13/2020   Benign prostatic hyperplasia with urinary frequency 08/13/2020   Hallux rigidus, right foot 08/13/2020   Nonunion of osteotomy site 08/13/2020   Pharyngeal dysphagia 05/28/2020   Myalgia due to statin 03/05/2020   Erythrocytopenia 12/24/2019   Megaloblastic anemia 12/24/2019   Horseshoe tear of retina without detachment, right eye 08/16/2019   Other vitreous opacities, bilateral 08/16/2019   Vitreomacular adhesion, right eye 08/16/2019   Vitreous degeneration, left eye 08/16/2019   Age-related nuclear cataract, bilateral 08/14/2019   Aortic root enlargement (HCC) 03/21/2019   History of depression 03/21/2019   History of gout 03/21/2019   History of syncope 03/21/2019   HLD (hyperlipidemia) 03/21/2019   Sinus bradycardia 03/21/2019   Ventricular premature complex 03/21/2019   Decreased GFR 03/01/2019   Thoracic ascending aortic aneurysm (HCC)    Syncopal episodes 12/15/2017   Chronic pain of left thumb 07/02/2017   Foot pain, right 09/21/2016   Sensorineural hearing loss (SNHL), bilateral 06/11/2016   Insomnia 06/02/2016   Flu-like symptoms 06/07/2015   Cough 11/06/2014   Erectile dysfunction  09/28/2013   GERD (gastroesophageal reflux disease) 09/28/2013   Depression 08/08/2013   Actinic keratoses 08/08/2013   Recurrent major depressive disorder, in partial remission (HCC) 08/08/2013   Ganglion of left wrist 09/30/2012   Urethral lesion 05/19/2012   Constipation 04/11/2012   Decreased hearing 12/15/2011   Chest pain 01/29/2011   Palpitations 01/29/2011   Snoring 01/29/2011   Acute upper respiratory infection 05/22/2010   HYPOGONADISM 03/24/2010   Inguinal hernia 03/24/2010   KNEE PAIN  03/24/2010   B12 deficiency 02/10/2010   Fatigue 02/10/2010   Hearing loss 12/02/2009   Neoplasm of uncertain behavior of skin 05/10/2009   Tinnitus 03/22/2009   VERTIGO 03/22/2009   LOW BACK PAIN 01/15/2009   DM2 (diabetes mellitus, type 2) (HCC) 12/21/2008   Dyslipidemia 12/21/2008   GOUT 12/21/2008   Anxiety disorder 12/21/2008   Adjustment disorder with mixed anxiety and depressed mood 12/21/2008   Essential hypertension 12/21/2008   Coronary atherosclerosis 12/21/2008   Osteoarthritis 12/21/2008   LEG CRAMPS 12/21/2008   Past Medical History:  Diagnosis Date   Allergy    Anxiety    no per pt   Aortic aneurysm (HCC)    Coronary artery disease    Depression    no per pt   DM type 2 (diabetes mellitus, type 2) (HCC)    GERD (gastroesophageal reflux disease)    Gout    Hearing loss    Hyperlipidemia    Hypertension    Hypogonadism male    Inguinal hernia    right   LBP (low back pain)    Leg cramps    Osteoarthritis    Skin neoplasm    Tinnitus    since 1997   Vertigo    Vitamin B12 deficiency     Family History  Problem Relation Age of Onset   Heart disease Father    Arthritis Sister    Lupus Sister    Heart disease Brother    CVA Brother    Coronary artery disease Other    Hypertension Other    Colon cancer Neg Hx    Esophageal cancer Neg Hx    Stomach cancer Neg Hx    Rectal cancer Neg Hx    Colon polyps Neg Hx     Past Surgical History:  Procedure Laterality Date   COLONOSCOPY     FOOT SURGERY     07-2019 triple bone fusion   FOOT SURGERY  08/2020   repeat fusion   heart stent     in 1997   INGUINAL HERNIA REPAIR     POLYPECTOMY     Social History   Occupational History   Occupation: Engineer, agricultural: [employer]  Tobacco Use   Smoking status: Never   Smokeless tobacco: Never  Vaping Use   Vaping status: Never Used  Substance and Sexual Activity   Alcohol use: Yes    Alcohol/week: 7.0 standard  drinks of alcohol    Types: 7 Glasses of wine per week    Comment: socially   Drug use: No   Sexual activity: Yes

## 2023-11-29 ENCOUNTER — Ambulatory Visit: Admitting: Family Medicine

## 2023-11-29 ENCOUNTER — Telehealth: Payer: Self-pay

## 2023-11-29 NOTE — Telephone Encounter (Signed)
 Patient was placed on the scheduled for for Jared Alvarez in a overbook slot Patient is a TOC from the hospital If someone could please rescheduled patient to a TOC Slot

## 2023-12-10 ENCOUNTER — Ambulatory Visit (HOSPITAL_BASED_OUTPATIENT_CLINIC_OR_DEPARTMENT_OTHER): Admitting: Physician Assistant

## 2023-12-21 ENCOUNTER — Telehealth: Payer: Self-pay | Admitting: Cardiovascular Disease

## 2023-12-21 ENCOUNTER — Ambulatory Visit (HOSPITAL_BASED_OUTPATIENT_CLINIC_OR_DEPARTMENT_OTHER)

## 2023-12-21 ENCOUNTER — Ambulatory Visit (INDEPENDENT_AMBULATORY_CARE_PROVIDER_SITE_OTHER): Admitting: Physician Assistant

## 2023-12-21 ENCOUNTER — Ambulatory Visit (INDEPENDENT_AMBULATORY_CARE_PROVIDER_SITE_OTHER)

## 2023-12-21 ENCOUNTER — Encounter (HOSPITAL_BASED_OUTPATIENT_CLINIC_OR_DEPARTMENT_OTHER): Payer: Self-pay | Admitting: Physician Assistant

## 2023-12-21 DIAGNOSIS — S42032D Displaced fracture of lateral end of left clavicle, subsequent encounter for fracture with routine healing: Secondary | ICD-10-CM | POA: Diagnosis not present

## 2023-12-21 DIAGNOSIS — I10 Essential (primary) hypertension: Secondary | ICD-10-CM

## 2023-12-21 DIAGNOSIS — M25511 Pain in right shoulder: Secondary | ICD-10-CM

## 2023-12-21 DIAGNOSIS — I2510 Atherosclerotic heart disease of native coronary artery without angina pectoris: Secondary | ICD-10-CM

## 2023-12-21 DIAGNOSIS — M19011 Primary osteoarthritis, right shoulder: Secondary | ICD-10-CM | POA: Diagnosis not present

## 2023-12-21 DIAGNOSIS — S42032A Displaced fracture of lateral end of left clavicle, initial encounter for closed fracture: Secondary | ICD-10-CM | POA: Diagnosis not present

## 2023-12-21 DIAGNOSIS — M25812 Other specified joint disorders, left shoulder: Secondary | ICD-10-CM | POA: Diagnosis not present

## 2023-12-21 DIAGNOSIS — Z79899 Other long term (current) drug therapy: Secondary | ICD-10-CM

## 2023-12-21 DIAGNOSIS — M85811 Other specified disorders of bone density and structure, right shoulder: Secondary | ICD-10-CM | POA: Diagnosis not present

## 2023-12-21 MED ORDER — LIDOCAINE HCL 1 % IJ SOLN
5.0000 mL | INTRAMUSCULAR | Status: AC | PRN
  Administered 2023-12-21: 5 mL

## 2023-12-21 MED ORDER — METHYLPREDNISOLONE ACETATE 40 MG/ML IJ SUSP
40.0000 mg | INTRAMUSCULAR | Status: AC | PRN
  Administered 2023-12-21: 40 mg via INTRA_ARTICULAR

## 2023-12-21 MED ORDER — MAGNESIUM 200 MG PO TABS: 200.0000 mg | ORAL_TABLET | Freq: Every day | ORAL | Status: AC

## 2023-12-21 NOTE — Telephone Encounter (Signed)
 Pt c/o medication issue:  1. Name of Medication:   Magnesium  400 MG TABS    2. How are you currently taking this medication (dosage and times per day)? As written   3. Are you having a reaction (difficulty breathing--STAT)? No   4. What is your medication issue? Pt called in stating this medication is causing him severe cramps in his bowels. He asked if he can reduce the dose.

## 2023-12-21 NOTE — Progress Notes (Signed)
 Cardiology Office Note   Date:  12/27/2023  ID:  Jared Alvarez, DOB 10-11-1950, MRN [id number] PCP: Garald Karlynn GAILS, MD  [facility] Providers Cardiologist:  Maude Emmer, MD    History of Present Illness Jared Alvarez is a 73 y.o. male with a past medical history of   CAD  Previously had stent to LAD in 1997  Most recent ischemic evaluation was a nuclear stress test in 01/2018 that showed no obvious ischemia noted on perfusion images HTN  HLD  Type 2 DM  Syncope , longstanding bradycardia , paroxysmal atrial fibrillation  Previous cardiac monitor in 2019 showed sinus rhythm with instances of bradycardia into the low 40s. Frequent PVCs with longest run 6 beats, PVC burden 12%.  Patient admitted 7/23 - 11/25/2023 after syncopal episode.  2 episodes of syncope in 07/2022 and then an additional episode of syncope on 7/23.  Had a prodrome prior to syncope.  Patient had been standing for about 3 hours, and had not had much water but had a whiskey and Diet Coke.  Felt lightheaded and had syncopal event.  Came to the ER and was found to have dislocated clavicular fracture.  Found to be in new onset atrial fibrillation It was recommended that patient start anticoagulation but patient preferred to wait for monitor results to come back prior to starting. Echocardiogram 11/21/2023 showed EF 55%, no regional wall motion abnormalities, grade 1 DD, normal RV systolic function, mild MR, mild dilatation of the ascending aorta measuring 41 mm  Today, patient presents for hospital follow-up appointment.  Reports that he has been doing okay since getting out of the hospital.  He continues to feel somewhat fatigued but denies recurrence of syncope or near syncope.  Denies palpitations, chest pain, shortness of breath.  We discussed results from recent monitor.  Monitor had shown an average heart rate of 56 bpm, 1 run VT lasting 5 beats, 45 episodes of SVT longest lasting 13 beats.  Lowest heart  rate was 37 bpm, on my review of monitor, it appears that the majority of bradycardia occurred during sleeping hours.  We discussed sleep study.  Patient has been working to increase his hydration.   Studies Reviewed    Cardiac Studies & Procedures   ______________________________________________________________________________________________   STRESS TESTS  MYOCARDIAL PERFUSION IMAGING 01/27/2018  Interpretation Summary  The left ventricular ejection fraction is moderately decreased (30-44%).  Nuclear stress EF: 42%.  ST segment depression of 2 mm was noted during stress in the II, III, aVF, V5 and V6 leads.  This is an intermediate risk study.  ECG positive for ischemia with 2 mm ST depression and T wave inversion inferior lateral leads with frequent PVCls Perfusion images are normal with no ischemia or infarct EF estimated at at 42% with diffuse hypokinesis but may Be affected by frequent PVCls   ECHOCARDIOGRAM  ECHOCARDIOGRAM COMPLETE 11/25/2023  Narrative ECHOCARDIOGRAM REPORT    Patient Name:   Jared Alvarez Date of Exam: 11/25/2023 Medical Rec #:  [id number]        Height:       68.0 in Accession #:    [id number]       Weight:       170.6 lb Date of Birth:  [birth year]        BSA:          1.910 m Patient Age:    73 years         BP:  153/79 mmHg Patient Gender: M                HR:           59 bpm. Exam Location:  Inpatient  Procedure: 2D Echo, Cardiac Doppler and Color Doppler (Both Spectral and Color Flow Doppler were utilized during procedure).  Indications:    Syncope R55  History:        Patient has prior history of Echocardiogram examinations, most recent 01/06/2018. Arrythmias:Atrial Fibrillation and Bradycardia, Signs/Symptoms:Hypotension, Syncope and Chest Pain; Risk Factors:Hypertension, Diabetes and Dyslipidemia.  Sonographer:    Thea Norlander RCS Referring Phys: [id number] JUSTIN B HOWERTER  IMPRESSIONS   1. Left ventricular  ejection fraction, by estimation, is 55%. The left ventricle has normal function. The left ventricle has no regional wall motion abnormalities. Left ventricular diastolic parameters are consistent with Grade I diastolic dysfunction (impaired relaxation). 2. Right ventricular systolic function is normal. The right ventricular size is normal. Tricuspid regurgitation signal is inadequate for assessing PA pressure. 3. Left atrial size was mildly dilated. 4. The mitral valve is normal in structure. Mild mitral valve regurgitation. No evidence of mitral stenosis. 5. The aortic valve is tricuspid. There is mild calcification of the aortic valve. Aortic valve regurgitation is mild. No aortic stenosis is present. 6. Aortic dilatation noted. There is mild dilatation of the ascending aorta, measuring 41 mm. 7. The IVC was not visualized.  FINDINGS Left Ventricle: Left ventricular ejection fraction, by estimation, is 55%. The left ventricle has normal function. The left ventricle has no regional wall motion abnormalities. The left ventricular internal cavity size was normal in size. There is no left ventricular hypertrophy. Left ventricular diastolic parameters are consistent with Grade I diastolic dysfunction (impaired relaxation).  Right Ventricle: The right ventricular size is normal. No increase in right ventricular wall thickness. Right ventricular systolic function is normal. Tricuspid regurgitation signal is inadequate for assessing PA pressure.  Left Atrium: Left atrial size was mildly dilated.  Right Atrium: Right atrial size was normal in size.  Pericardium: There is no evidence of pericardial effusion.  Mitral Valve: The mitral valve is normal in structure. Mild mitral valve regurgitation. No evidence of mitral valve stenosis.  Tricuspid Valve: The tricuspid valve is normal in structure. Tricuspid valve regurgitation is trivial.  Aortic Valve: The aortic valve is tricuspid. There is mild  calcification of the aortic valve. Aortic valve regurgitation is mild. No aortic stenosis is present. Aortic valve peak gradient measures 10.0 mmHg.  Pulmonic Valve: The pulmonic valve was normal in structure. Pulmonic valve regurgitation is trivial.  Aorta: The aortic root is normal in size and structure and aortic dilatation noted. There is mild dilatation of the ascending aorta, measuring 41 mm.  Venous: The IVC was not visualized.  IAS/Shunts: No atrial level shunt detected by color flow Doppler.   LEFT VENTRICLE PLAX 2D LVIDd:         4.80 cm   Diastology LVIDs:         3.60 cm   LV e' medial:    10.00 cm/s LV PW:         1.10 cm   LV E/e' medial:  8.1 LV IVS:        0.80 cm   LV e' lateral:   14.10 cm/s LVOT diam:     2.20 cm   LV E/e' lateral: 5.8 LV SV:         90 LV SV Index:   47 LVOT Area:  3.80 cm   RIGHT VENTRICLE RV S prime:     12.70 cm/s TAPSE (M-mode): 2.1 cm  LEFT ATRIUM           Index        RIGHT ATRIUM           Index LA diam:      4.10 cm 2.15 cm/m   RA Area:     16.10 cm LA Vol (A2C): 39.2 ml 20.52 ml/m  RA Volume:   34.50 ml  18.06 ml/m LA Vol (A4C): 66.5 ml 34.81 ml/m AORTIC VALVE AV Area (Vmax): 2.57 cm AV Vmax:        158.50 cm/s AV Peak Grad:   10.0 mmHg LVOT Vmax:      107.00 cm/s LVOT Vmean:     67.700 cm/s LVOT VTI:       0.238 m  AORTA Ao Root diam: 3.50 cm Ao Asc diam:  4.10 cm  MITRAL VALVE MV Area (PHT): 3.48 cm    SHUNTS MV Decel Time: 218 msec    Systemic VTI:  0.24 m MV E velocity: 81.20 cm/s  Systemic Diam: 2.20 cm MV A velocity: 71.30 cm/s MV E/A ratio:  1.14  Dalton McleanMD Electronically signed by Ezra Kanner Signature Date/Time: 11/25/2023/5:19:20 PM    Final    MONITORS  LONG TERM MONITOR-LIVE TELEMETRY (3-14 DAYS) 12/22/2023  Narrative Patch Wear Time:  14 days and 0 hours (2025-07-24T14:45:56-0400 to 2025-08-07T14:45:56-0400)  Patient had a min HR of 37 bpm, max HR of 152 bpm, and avg HR of  56 bpm. Predominant underlying rhythm was Sinus Rhythm. First Degree AV Block was present. Bundle Branch Block/IVCD was present. 1 run of Ventricular Tachycardia occurred lasting 5 beats with a max rate of 152 bpm (avg 105 bpm). 45 Supraventricular Tachycardia runs occurred, the run with the fastest interval lasting 6 beats with a max rate of 143 bpm, the longest lasting 13 beats with an avg rate of 98 bpm. Isolated SVEs were occasional (1.6%, 17823), SVE Couplets were rare (<1.0%, 632), and SVE Triplets were rare (<1.0%, 101). Isolated VEs were occasional (1.4%, 15370), VE Couplets were rare (<1.0%, 248), and VE Triplets were rare (<1.0%, 23). Ventricular Bigeminy and Trigeminy were present.  Maude Emmer MD [facility]       ______________________________________________________________________________________________       Risk Assessment/Calculations  CHA2DS2-VASc Score = 4   This indicates a 4.8% annual risk of stroke. The patient's score is based upon: CHF History: 0 HTN History: 1 Diabetes History: 1 Stroke History: 0 Vascular Disease History: 1 Age Score: 1 Gender Score: 0        Physical Exam VS:  BP 122/70   Pulse 79   Ht 5' 8 (1.727 m)   Wt 165 lb 6.4 oz (75 kg)   SpO2 96%   BMI 25.15 kg/m        Wt Readings from Last 3 Encounters:  12/27/23 165 lb 6.4 oz (75 kg)  11/25/23 170 lb 9.6 oz (77.4 kg)  10/26/23 168 lb (76.2 kg)    GEN: Well nourished, well developed in no acute distress.  Sitting comfortably in the chair NECK: No JVD; No carotid bruits CARDIAC: RRR, no murmurs, rubs, gallops.   RESPIRATORY:  Clear to auscultation without rales, wheezing or rhonchi  ABDOMEN: Soft, non-tender, non-distended EXTREMITIES:  No edema in BLE  ASSESSMENT AND PLAN  Syncope  Bradycardia  - Patient has longstanding history of asymptomatic bradycardia  - Had syncopal episode on  7/23 after he had been standing for 3 hours. Had not had much to drink beside a whiskey and diet  coke.  - Echocardiogram showed EF 55%, no regional wall motion abnormalities, grade 1 DD, normal RV systolic function, mild MR, mild dilatation of the ascending aorta measuring 41 mm - Cardiac monitor showed predominantly sinus rhythm with average HR 56 BPM.  No arrhythmias that would cause syncope. Lowest HR was in the 30s and seems that HR lowest during sleeping hours. Discussed that nocturnal bradycardia is not indication for PPM  - Patient does have daytime fatigue and snores loudly. His wife has seen him stop breathing in his sleep. Ordered Sleep study  - Patient denies recurrent syncope or near syncope  - Encouraged hydration   Paroxysmal Atrial  Fibrillation  - After syncopal episode on 7/23, patient was found to be in new onset atrial fibrillation.  He spontaneously converted to normal sinus rhythm shortly after arrival.  Was asymptomatic when in A-fib. - At that time, it was recommended that patient start anticoagulation.  However, he wanted to discuss with his wife and wait for monitor results to come back prior to starting medications.  He understood the risk of stroke if he was having silent A-fib - 14 day zio earlier this month did not show any afib recurrence. Denies palpitations, tachycardia  - Discussed anticoagulation and recommended based on CHADS-VASc 4. Patient would prefer to remain off anticoagulation at this time. He understands stroke risk. May be willing to start in the future if he has a higher burden of afib. Discussed monitoring for afib recurrence with apple watch, kardia mobile   CAD  - Previously had stent to LAD in 1997  - Most recent ischemic evaluation was a nuclear stress test in 01/2018 that showed no obvious ischemia noted on perfusion images - Patient denies chest pain, DOE. Able to walk 2+ miles without angina. No indication for ischemic eval - Continue ASA 81 mg daily, crestor  20 mg 3 days per week   HLD  - LDL 119 in 10/2023 - Continue Crestor  20 mg 3 times  per week   Mild dilatation of the ascending aorta  - Measured 41 mm on echo in 11/2023 - BP well controlled   Electrolyte abnormalities  - Mag was 1.2 on 7/24. He has been on magnesium  supplementation. Improved slightly to 1.4 on 8/20  - Continue mag supplement - Na 129 on 8/20. Patient reports that he has been drinking significantly more water than previously.  - Ordered repeat BMP and mag to be completed in 8 weeks   Dispo: Follow up in 4 months with Dr. Delford   Signed, Jared FABIENE Louder, PA-C

## 2023-12-21 NOTE — Telephone Encounter (Signed)
 Delford Maude BROCKS, MD to [place], RN  Drena Noel Males, RN   12/21/23 12:00 PM Can decrease to 200 mg daily but have him check BMET/Mg levels with labs  Patient aware of Dr. Claiborne advisement. Will place order for lab work.

## 2023-12-21 NOTE — Progress Notes (Signed)
 Office Visit Note   Patient: Jared Alvarez           Date of Birth: [birth year]           MRN: [id number] Visit Date: 12/21/2023              Requested by: Garald Karlynn GAILS, MD [address] [place],  [place] [address] PCP: Plotnikov, Karlynn GAILS, MD  Chief Complaint  Patient presents with   Left Shoulder - Follow-up      HPI: Patient is a pleasant 73 year old gentleman who follows up for his clavicle fracture on the left.  With regards that he is doing quite good he has no pain.  He is wants to talk today more about his right shoulder with which she has had difficulty with  Assessment & Plan: Visit Diagnoses:  1. Acute pain of right shoulder   2. Traumatic closed displaced fracture of acromial end of clavicle with routine healing, left     Plan:  With regard to his left shoulder he has no complaints.  I released him to all activities today he should listen to any painful symptoms.  On the right shoulder he has significant calcific tendinitis and degenerative changes.  I asked if he would like to try an injection today we will go forward with that Follow-Up Instructions: He has had steroid injections in his hands before did not have any difficulties  Ortho Exam  Patient is alert, oriented, no adenopathy, well-dressed, normal affect, normal respiratory effort. Right shoulder he has forward elevation internal rotation behind the back but both are somewhat painful positive empty can test really does not have much pain with external rotation strength is intact.  No radicular findings    Imaging: No results found. No images are attached to the encounter.  Labs: Lab Results  Component Value Date   HGBA1C 6.0 10/22/2023   HGBA1C 6.0 06/11/2022   HGBA1C 6.3 07/23/2021   LABURIC 5.2 10/28/2020   LABURIC 7.1 09/22/2016   LABURIC 8.0 (H) 12/15/2011     Lab Results  Component Value Date   ALBUMIN 3.0 (L) 11/25/2023   ALBUMIN 4.2 10/22/2023   ALBUMIN 4.1 06/11/2022     Lab Results  Component Value Date   MG 1.2 (L) 11/25/2023   Lab Results  Component Value Date   VD25OH 46.16 01/30/2021   VD25OH 22.96 (L) 08/28/2015   VD25OH 52 04/13/2013    No results found for: PREALBUMIN    Latest Ref Rng & Units 11/25/2023    4:42 AM 11/24/2023   10:06 PM 10/22/2023    8:15 AM  CBC EXTENDED  WBC 4.0 - 10.5 K/uL 5.5  4.9  3.4   RBC 4.22 - 5.81 MIL/uL 3.04  3.43  3.82   Hemoglobin 13.0 - 17.0 g/dL 9.8  88.9  87.8   HCT 60.9 - 52.0 % 28.4  32.8  35.8   Platelets 150 - 400 K/uL 172  227  226.0   NEUT# 1.7 - 7.7 K/uL 3.8  3.4  2.1   Lymph# 0.7 - 4.0 K/uL 1.1  0.9  0.8      There is no height or weight on file to calculate BMI.  Orders:  Orders Placed This Encounter  Procedures   DG Clavicle Left   DG Shoulder Right   No orders of the defined types were placed in this encounter.    Procedures: Large Joint Inj: R subacromial bursa on 12/21/2023 10:19 AM Indications: diagnostic evaluation  and pain Details: 1.5 in  Arthrogram: No  Medications: 5 mL lidocaine  1 %; 40 mg methylPREDNISolone  acetate 40 MG/ML Outcome: tolerated well, no immediate complications Procedure, treatment alternatives, risks and benefits explained, specific risks discussed. Consent was given by the patient.     Clinical Data: No additional findings.  ROS:  All other systems negative, except as noted in the HPI. Review of Systems  Objective: Vital Signs: There were no vitals taken for this visit.  Specialty Comments:  No specialty comments available.  PMFS History: Patient Active Problem List   Diagnosis Date Noted   Traumatic closed displaced fracture of acromial end of clavicle with routine healing, left 11/26/2023   Syncope 11/25/2023   New onset atrial fibrillation (HCC) 11/25/2023   AKI (acute kidney injury) (HCC) 11/25/2023   Closed left clavicular fracture 11/25/2023   Hypomagnesemia 11/25/2023   History of essential hypertension 11/25/2023    Coronary artery disease involving native coronary artery of native heart without angina pectoris 11/25/2023   Syncope and collapse 11/25/2023   Abnormal CBC 10/26/2023   Sleep disorder 04/06/2023   Bladder-neck obstruction 02/26/2022   Hematospermia 10/07/2021   Orthostatic hypotension 07/23/2021   Somnolence, daytime 07/23/2021   Contact dermatitis due to plants, except food 04/09/2021   Elevated PSA 01/30/2021   Anemia of chronic disease 10/15/2020   Arthritis of foot 08/13/2020   Benign prostatic hyperplasia with urinary frequency 08/13/2020   Hallux rigidus, right foot 08/13/2020   Nonunion of osteotomy site 08/13/2020   Pharyngeal dysphagia 05/28/2020   Myalgia due to statin 03/05/2020   Erythrocytopenia 12/24/2019   Megaloblastic anemia 12/24/2019   Horseshoe tear of retina without detachment, right eye 08/16/2019   Other vitreous opacities, bilateral 08/16/2019   Vitreomacular adhesion, right eye 08/16/2019   Vitreous degeneration, left eye 08/16/2019   Age-related nuclear cataract, bilateral 08/14/2019   Aortic root enlargement (HCC) 03/21/2019   History of depression 03/21/2019   History of gout 03/21/2019   History of syncope 03/21/2019   HLD (hyperlipidemia) 03/21/2019   Sinus bradycardia 03/21/2019   Ventricular premature complex 03/21/2019   Decreased GFR 03/01/2019   Thoracic ascending aortic aneurysm (HCC)    Syncopal episodes 12/15/2017   Chronic pain of left thumb 07/02/2017   Foot pain, right 09/21/2016   Sensorineural hearing loss (SNHL), bilateral 06/11/2016   Insomnia 06/02/2016   Flu-like symptoms 06/07/2015   Cough 11/06/2014   Erectile dysfunction 09/28/2013   GERD (gastroesophageal reflux disease) 09/28/2013   Depression 08/08/2013   Actinic keratoses 08/08/2013   Recurrent major depressive disorder, in partial remission (HCC) 08/08/2013   Ganglion of left wrist 09/30/2012   Urethral lesion 05/19/2012   Constipation 04/11/2012   Decreased  hearing 12/15/2011   Chest pain 01/29/2011   Palpitations 01/29/2011   Snoring 01/29/2011   Acute upper respiratory infection 05/22/2010   HYPOGONADISM 03/24/2010   Inguinal hernia 03/24/2010   KNEE PAIN 03/24/2010   B12 deficiency 02/10/2010   Fatigue 02/10/2010   Hearing loss 12/02/2009   Neoplasm of uncertain behavior of skin 05/10/2009   Tinnitus 03/22/2009   VERTIGO 03/22/2009   LOW BACK PAIN 01/15/2009   DM2 (diabetes mellitus, type 2) (HCC) 12/21/2008   Dyslipidemia 12/21/2008   GOUT 12/21/2008   Anxiety disorder 12/21/2008   Adjustment disorder with mixed anxiety and depressed mood 12/21/2008   Essential hypertension 12/21/2008   Coronary atherosclerosis 12/21/2008   Osteoarthritis 12/21/2008   LEG CRAMPS 12/21/2008   Past Medical History:  Diagnosis Date   Allergy  Anxiety    no per pt   Aortic aneurysm (HCC)    Coronary artery disease    Depression    no per pt   DM type 2 (diabetes mellitus, type 2) (HCC)    GERD (gastroesophageal reflux disease)    Gout    Hearing loss    Hyperlipidemia    Hypertension    Hypogonadism male    Inguinal hernia    right   LBP (low back pain)    Leg cramps    Osteoarthritis    Skin neoplasm    Tinnitus    since 1997   Vertigo    Vitamin B12 deficiency     Family History  Problem Relation Age of Onset   Heart disease Father    Arthritis Sister    Lupus Sister    Heart disease Brother    CVA Brother    Coronary artery disease Other    Hypertension Other    Colon cancer Neg Hx    Esophageal cancer Neg Hx    Stomach cancer Neg Hx    Rectal cancer Neg Hx    Colon polyps Neg Hx     Past Surgical History:  Procedure Laterality Date   COLONOSCOPY     FOOT SURGERY     07-2019 triple bone fusion   FOOT SURGERY  08/2020   repeat fusion   heart stent     in 1997   INGUINAL HERNIA REPAIR     POLYPECTOMY     Social History   Occupational History   Occupation: Engineer, agricultural:  [employer]  Tobacco Use   Smoking status: Never   Smokeless tobacco: Never  Vaping Use   Vaping status: Never Used  Substance and Sexual Activity   Alcohol use: Yes    Alcohol/week: 7.0 standard drinks of alcohol    Types: 7 Glasses of wine per week    Comment: socially   Drug use: No   Sexual activity: Yes

## 2023-12-22 DIAGNOSIS — I10 Essential (primary) hypertension: Secondary | ICD-10-CM | POA: Diagnosis not present

## 2023-12-22 DIAGNOSIS — I2510 Atherosclerotic heart disease of native coronary artery without angina pectoris: Secondary | ICD-10-CM | POA: Diagnosis not present

## 2023-12-22 DIAGNOSIS — R55 Syncope and collapse: Secondary | ICD-10-CM

## 2023-12-22 DIAGNOSIS — Z79899 Other long term (current) drug therapy: Secondary | ICD-10-CM | POA: Diagnosis not present

## 2023-12-22 NOTE — Addendum Note (Signed)
 Encounter addended by: Malvina Pina A on: 12/22/2023 7:54 AM  Actions taken: Imaging Exam ended

## 2023-12-23 ENCOUNTER — Ambulatory Visit: Payer: Self-pay | Admitting: Cardiovascular Disease

## 2023-12-23 ENCOUNTER — Ambulatory Visit: Admitting: Cardiology

## 2023-12-23 ENCOUNTER — Telehealth: Payer: Self-pay

## 2023-12-23 DIAGNOSIS — F411 Generalized anxiety disorder: Secondary | ICD-10-CM | POA: Diagnosis not present

## 2023-12-23 LAB — BASIC METABOLIC PANEL WITH GFR
BUN/Creatinine Ratio: 18 (ref 10–24)
BUN: 20 mg/dL (ref 8–27)
CO2: 19 mmol/L — ABNORMAL LOW (ref 20–29)
Calcium: 9.7 mg/dL (ref 8.6–10.2)
Chloride: 93 mmol/L — ABNORMAL LOW (ref 96–106)
Creatinine, Ser: 1.1 mg/dL (ref 0.76–1.27)
Glucose: 178 mg/dL — ABNORMAL HIGH (ref 70–99)
Potassium: 4.9 mmol/L (ref 3.5–5.2)
Sodium: 129 mmol/L — ABNORMAL LOW (ref 134–144)
eGFR: 71 mL/min/1.73 (ref >59)

## 2023-12-23 LAB — MAGNESIUM: Magnesium: 1.4 mg/dL — AB (ref 1.6–2.3)

## 2023-12-23 NOTE — Telephone Encounter (Signed)
 Copied from CRM [id number]. Topic: Appointments - Appointment Scheduling >> Dec 23, 2023 10:55 AM Rosina BIRCH wrote: Patient would like a call back because he had lab work done on yesterday at [facility] and some of the labs are concerning him  [phone]

## 2023-12-27 ENCOUNTER — Encounter: Payer: Self-pay | Admitting: Cardiology

## 2023-12-27 ENCOUNTER — Ambulatory Visit: Attending: Cardiology | Admitting: Cardiology

## 2023-12-27 VITALS — BP 122/70 | HR 79 | Ht 68.0 in | Wt 165.4 lb

## 2023-12-27 DIAGNOSIS — R55 Syncope and collapse: Secondary | ICD-10-CM | POA: Diagnosis not present

## 2023-12-27 DIAGNOSIS — I10 Essential (primary) hypertension: Secondary | ICD-10-CM | POA: Insufficient documentation

## 2023-12-27 DIAGNOSIS — I2510 Atherosclerotic heart disease of native coronary artery without angina pectoris: Secondary | ICD-10-CM | POA: Diagnosis not present

## 2023-12-27 DIAGNOSIS — Z79899 Other long term (current) drug therapy: Secondary | ICD-10-CM | POA: Diagnosis not present

## 2023-12-27 DIAGNOSIS — G4730 Sleep apnea, unspecified: Secondary | ICD-10-CM | POA: Insufficient documentation

## 2023-12-27 NOTE — Telephone Encounter (Signed)
 Dr. Delford has addressed abnormal labs.  Thanks

## 2023-12-27 NOTE — Patient Instructions (Signed)
 Medication Instructions:  No changes *If you need a refill on your cardiac medications before your next appointment, please call your pharmacy*  Lab Work: In 8 weeks we are going to draw a Bmet and mag level If you have labs (blood work) drawn today and your tests are completely normal, you will receive your results only by: MyChart Message (if you have MyChart) OR A paper copy in the mail If you have any lab test that is abnormal or we need to change your treatment, we will call you to review the results.  Testing/Procedures: No testing  Follow-Up: At [facility], you and your health needs are our priority.  As part of our continuing mission to provide you with exceptional heart care, our providers are all part of one team.  This team includes your primary Cardiologist (physician) and Advanced Practice Providers or APPs (Physician Assistants and Nurse Practitioners) who all work together to provide you with the care you need, when you need it.  Your next appointment:   4 month(s)  Provider:   Maude Emmer, MD    We recommend signing up for the patient portal called MyChart.  Sign up information is provided on this After Visit Summary.  MyChart is used to connect with patients for Virtual Visits (Telemedicine).  Patients are able to view lab/test results, encounter notes, upcoming appointments, etc.  Non-urgent messages can be sent to your provider as well.   To learn more about what you can do with MyChart, go to [email or web address].   Other Instructions WatchPAT?  Is a FDA cleared portable home sleep study test that uses a watch and 3 points of contact to monitor 7 different channels, including your heart rate, oxygen saturations, body position, snoring, and chest motion.  The study is easy to use from the comfort of your own home and accurately detect sleep apnea.  Before bed, you attach the chest sensor, attached the sleep apnea bracelet to your nondominant hand, and  attach the finger probe.  After the study, the raw data is downloaded from the watch and scored for apnea events.   For more information: [email or web address]  Patient Testing Instructions:  Do not put battery into the device until bedtime when you are ready to begin the test. Please call the support number if you need assistance after following the instructions below: 24 hour support line- [phone] or [facility] support at [phone] (option 2)  Download the [facility] WatchPAT One app through the google play store or App Store  Be sure to turn on or enable access to bluetooth in settlings on your smartphone/ device  Make sure no other bluetooth devices are on and within the vicinity of your smartphone/ device and WatchPAT watch during testing.  Make sure to leave your smart phone/ device plugged in and charging all night.  When ready for bed:  Follow the instructions step by step in the WatchPAT One App to activate the testing device. For additional instructions, including video instruction, visit the WatchPAT One video on Youtube. You can search for WatchPat One within Youtube (video is 4 minutes and 18 seconds) or enter: https://youtube/watch?[email or web address]_vbiwxE Please note: You will be prompted to enter a Pin to connect via bluetooth when starting the test. The PIN will be assigned to you when you receive the test.  The device is disposable, but it recommended that you retain the device until you receive a call letting you know the study has been received and the results have been interpreted.  We will let you know if the study did not transmit to us  properly after the test is completed. You do not need to call us  to confirm the receipt of the test.  Please complete the test within 48 hours of receiving PIN.   Frequently Asked Questions:  What is Watch Bruna one?  A single use fully disposable home sleep apnea testing device and will not need to be returned after completion.   What are the requirements to use WatchPAT one?  The be able to have a successful watchpat one sleep study, you should have your Watch pat one device, your smart phone, watch pat one app, your PIN number and Internet access What type of phone do I need?  You should have a smart phone that uses Android 5.1 and above or any Iphone with IOS 10 and above How can I download the WatchPAT one app?  Based on your device type search for WatchPAT one app either in google play for android devices or APP store for Iphone's Where will I get my PIN for the study?  Your PIN will be provided by your physician's office. It is used for authentication and if you lose/forget your PIN, please reach out to your providers office.  I do not have Internet at home. Can I do WatchPAT one study?  WatchPAT One needs Internet connection throughout the night to be able to transmit the sleep data. You can use your home/local internet or your cellular's data package. However, it is always recommended to use home/local Internet. It is estimated that between 20MB-30MB will be used with each study.However, the application will be looking for space in the phone to start the study.  What happens if I lose internet or bluetooth connection?  During the internet disconnection, your phone will not be able to transmit the sleep data. All the data, will be stored in your phone. As soon as the internet connection is back on, the phone will being sending the sleep data. During the bluetooth disconnection, WatchPAT one will not be able to to send the sleep data to your phone. Data will be kept in the WatchPAT one until two devices have bluetooth connection back on. As soon as the connection is back on, WatchPAT one will send the sleep data to the phone.  How long do I need to wear the WatchPAT one?  After you start the study, you should wear the device at least 6 hours.  How far should I keep my phone from the device?  During the night,  your phone should be within 15 feet.  What happens if I leave the room for restroom or other reasons?  Leaving the room for any reason will not cause any problem. As soon as your get back to the room, both devices will reconnect and will continue to send the sleep data. Can I use my phone during the sleep study?  Yes, you can use your phone as usual during the study. But it is recommended to put your watchpat one on when you are ready to go to bed.  How will I get my study results?  A soon as you completed your study, your sleep data will be sent to the provider. They will then share the results with you when they are ready.

## 2023-12-30 DIAGNOSIS — F411 Generalized anxiety disorder: Secondary | ICD-10-CM | POA: Diagnosis not present

## 2024-01-09 ENCOUNTER — Other Ambulatory Visit: Payer: Self-pay | Admitting: Internal Medicine

## 2024-01-10 DIAGNOSIS — F411 Generalized anxiety disorder: Secondary | ICD-10-CM | POA: Diagnosis not present

## 2024-01-12 ENCOUNTER — Other Ambulatory Visit: Payer: Self-pay | Admitting: Internal Medicine

## 2024-01-24 DIAGNOSIS — F411 Generalized anxiety disorder: Secondary | ICD-10-CM | POA: Diagnosis not present

## 2024-01-31 DIAGNOSIS — F411 Generalized anxiety disorder: Secondary | ICD-10-CM | POA: Diagnosis not present

## 2024-02-21 DIAGNOSIS — F411 Generalized anxiety disorder: Secondary | ICD-10-CM | POA: Diagnosis not present

## 2024-02-22 DIAGNOSIS — Z79899 Other long term (current) drug therapy: Secondary | ICD-10-CM | POA: Diagnosis not present

## 2024-02-22 DIAGNOSIS — I10 Essential (primary) hypertension: Secondary | ICD-10-CM | POA: Diagnosis not present

## 2024-02-22 DIAGNOSIS — I2510 Atherosclerotic heart disease of native coronary artery without angina pectoris: Secondary | ICD-10-CM | POA: Diagnosis not present

## 2024-02-23 ENCOUNTER — Ambulatory Visit: Payer: Self-pay | Admitting: Cardiology

## 2024-02-23 LAB — BASIC METABOLIC PANEL WITH GFR
BUN/Creatinine Ratio: 18 (ref 10–24)
BUN: 17 mg/dL (ref 8–27)
CO2: 24 mmol/L (ref 20–29)
Calcium: 9.9 mg/dL (ref 8.6–10.2)
Chloride: 100 mmol/L (ref 96–106)
Creatinine, Ser: 0.95 mg/dL (ref 0.76–1.27)
Glucose: 108 mg/dL — ABNORMAL HIGH (ref 70–99)
Potassium: 5.1 mmol/L (ref 3.5–5.2)
Sodium: 138 mmol/L (ref 134–144)
eGFR: 85 mL/min/1.73 (ref >59)

## 2024-02-23 LAB — MAGNESIUM: Magnesium: 1.5 mg/dL — ABNORMAL LOW (ref 1.6–2.3)

## 2024-02-28 DIAGNOSIS — F411 Generalized anxiety disorder: Secondary | ICD-10-CM | POA: Diagnosis not present

## 2024-03-06 ENCOUNTER — Encounter: Payer: Self-pay | Admitting: Radiology

## 2024-03-06 DIAGNOSIS — F411 Generalized anxiety disorder: Secondary | ICD-10-CM | POA: Diagnosis not present

## 2024-03-16 ENCOUNTER — Ambulatory Visit (INDEPENDENT_AMBULATORY_CARE_PROVIDER_SITE_OTHER): Admitting: Family Medicine

## 2024-03-16 ENCOUNTER — Ambulatory Visit: Payer: Self-pay | Admitting: *Deleted

## 2024-03-16 ENCOUNTER — Ambulatory Visit (HOSPITAL_BASED_OUTPATIENT_CLINIC_OR_DEPARTMENT_OTHER): Admitting: Family Medicine

## 2024-03-16 ENCOUNTER — Encounter: Payer: Self-pay | Admitting: Family Medicine

## 2024-03-16 VITALS — BP 122/80 | HR 52 | Temp 97.8°F | Ht 68.0 in | Wt 171.6 lb

## 2024-03-16 DIAGNOSIS — J029 Acute pharyngitis, unspecified: Secondary | ICD-10-CM | POA: Diagnosis not present

## 2024-03-16 DIAGNOSIS — R051 Acute cough: Secondary | ICD-10-CM

## 2024-03-16 DIAGNOSIS — R0981 Nasal congestion: Secondary | ICD-10-CM

## 2024-03-16 DIAGNOSIS — J069 Acute upper respiratory infection, unspecified: Secondary | ICD-10-CM

## 2024-03-16 DIAGNOSIS — H6991 Unspecified Eustachian tube disorder, right ear: Secondary | ICD-10-CM | POA: Diagnosis not present

## 2024-03-16 LAB — POCT INFLUENZA A/B
Influenza A, POC: NEGATIVE
Influenza B, POC: NEGATIVE

## 2024-03-16 LAB — POCT RAPID STREP A (OFFICE): Rapid Strep A Screen: NEGATIVE

## 2024-03-16 LAB — POC COVID19 BINAXNOW: SARS Coronavirus 2 Ag: NEGATIVE

## 2024-03-16 NOTE — Progress Notes (Signed)
 Established Patient Office Visit   Subjective  Patient ID: Jared Alvarez, male    DOB: 11/27/1950  Age: 73 y.o. MRN: [id number]  Chief Complaint  Patient presents with   Acute Visit    Cough congestion sore throat body aches right pressure, started 2 days     Patient is a 73 year old male followed by Dr. Garald and seen for acute concern.  Patient endorses loose stools starting yesterday.  Patient then developed a right ear ache that became worse overnight.  Patient also endorses coughing, sneezing, headaches, rhinorrhea.  Denies fever, bilateral TMs full nausea, vomiting, facial pain/pressure, sick contacts.  Tried leftover neomycin drops and ear, Tylenol , and Robitussin DM.    Patient Active Problem List   Diagnosis Date Noted   Traumatic closed displaced fracture of acromial end of clavicle with routine healing, left 11/26/2023   Syncope 11/25/2023   New onset atrial fibrillation (HCC) 11/25/2023   AKI (acute kidney injury) 11/25/2023   Closed left clavicular fracture 11/25/2023   Hypomagnesemia 11/25/2023   History of essential hypertension 11/25/2023   Coronary artery disease involving native coronary artery of native heart without angina pectoris 11/25/2023   Syncope and collapse 11/25/2023   Abnormal CBC 10/26/2023   Sleep disorder 04/06/2023   Bladder-neck obstruction 02/26/2022   Hematospermia 10/07/2021   Orthostatic hypotension 07/23/2021   Somnolence, daytime 07/23/2021   Contact dermatitis due to plants, except food 04/09/2021   Elevated PSA 01/30/2021   Anemia of chronic disease 10/15/2020   Arthritis of foot 08/13/2020   Benign prostatic hyperplasia with urinary frequency 08/13/2020   Hallux rigidus, right foot 08/13/2020   Nonunion of osteotomy site 08/13/2020   Pharyngeal dysphagia 05/28/2020   Myalgia due to statin 03/05/2020   Erythrocytopenia 12/24/2019   Megaloblastic anemia 12/24/2019   Horseshoe tear of retina without detachment, right eye  08/16/2019   Other vitreous opacities, bilateral 08/16/2019   Vitreomacular adhesion, right eye 08/16/2019   Vitreous degeneration, left eye 08/16/2019   Age-related nuclear cataract, bilateral 08/14/2019   Aortic root enlargement 03/21/2019   History of depression 03/21/2019   History of gout 03/21/2019   History of syncope 03/21/2019   HLD (hyperlipidemia) 03/21/2019   Sinus bradycardia 03/21/2019   Ventricular premature complex 03/21/2019   Decreased GFR 03/01/2019   Thoracic ascending aortic aneurysm    Syncopal episodes 12/15/2017   Chronic pain of left thumb 07/02/2017   Foot pain, right 09/21/2016   Sensorineural hearing loss (SNHL), bilateral 06/11/2016   Insomnia 06/02/2016   Flu-like symptoms 06/07/2015   Cough 11/06/2014   Erectile dysfunction 09/28/2013   GERD (gastroesophageal reflux disease) 09/28/2013   Depression 08/08/2013   Actinic keratoses 08/08/2013   Recurrent major depressive disorder, in partial remission 08/08/2013   Ganglion of left wrist 09/30/2012   Urethral lesion 05/19/2012   Constipation 04/11/2012   Decreased hearing 12/15/2011   Chest pain 01/29/2011   Palpitations 01/29/2011   Snoring 01/29/2011   Acute upper respiratory infection 05/22/2010   HYPOGONADISM 03/24/2010   Inguinal hernia 03/24/2010   KNEE PAIN 03/24/2010   B12 deficiency 02/10/2010   Fatigue 02/10/2010   Hearing loss 12/02/2009   Neoplasm of uncertain behavior of skin 05/10/2009   Tinnitus 03/22/2009   VERTIGO 03/22/2009   LOW BACK PAIN 01/15/2009   DM2 (diabetes mellitus, type 2) (HCC) 12/21/2008   Dyslipidemia 12/21/2008   GOUT 12/21/2008   Anxiety disorder 12/21/2008   Adjustment disorder with mixed anxiety and depressed mood 12/21/2008   Essential hypertension 12/21/2008  Coronary atherosclerosis 12/21/2008   Osteoarthritis 12/21/2008   LEG CRAMPS 12/21/2008   Past Medical History:  Diagnosis Date   Allergy    Anxiety    no per pt   Aortic aneurysm     Coronary artery disease    Depression    no per pt   DM type 2 (diabetes mellitus, type 2) (HCC)    GERD (gastroesophageal reflux disease)    Gout    Hearing loss    Hyperlipidemia    Hypertension    Hypogonadism male    Inguinal hernia    right   LBP (low back pain)    Leg cramps    Osteoarthritis    Skin neoplasm    Tinnitus    since 1997   Vertigo    Vitamin B12 deficiency    Past Surgical History:  Procedure Laterality Date   COLONOSCOPY     FOOT SURGERY     07-2019 triple bone fusion   FOOT SURGERY  08/2020   repeat fusion   heart stent     in 1997   INGUINAL HERNIA REPAIR     POLYPECTOMY     Social History   Tobacco Use   Smoking status: Never   Smokeless tobacco: Never  Vaping Use   Vaping status: Never Used  Substance Use Topics   Alcohol use: Yes    Alcohol/week: 7.0 standard drinks of alcohol    Types: 7 Glasses of wine per week    Comment: socially   Drug use: No   Family History  Problem Relation Age of Onset   Heart disease Father    Arthritis Sister    Lupus Sister    Heart disease Brother    CVA Brother    Coronary artery disease Other    Hypertension Other    Colon cancer Neg Hx    Esophageal cancer Neg Hx    Stomach cancer Neg Hx    Rectal cancer Neg Hx    Colon polyps Neg Hx    Allergies  Allergen Reactions   Atorvastatin     REACTION: pains   Bupropion Hcl     REACTION: side effects   Doxazosin      Dizzy, syncope    Duloxetine     REACTION: dizzy   Fluticasone     Other reaction(s): other/intolerance   Nexium  [Esomeprazole  Magnesium ]     Rash on legs   Simvastatin     REACTION: pain   Venlafaxine     REACTION: side effects   Viibryd  [Vilazodone  Hcl]     nightmares   Vilazodone      Other reaction(s): psychological reaction Nightmares    Ketorolac  Palpitations   Tamsulosin  Palpitations    ROS Negative unless stated above    Objective:     BP 122/80 (BP Location: Left Arm, Patient Position: Sitting, Cuff  Size: Large)   Pulse (!) 52   Temp 97.8 F (36.6 C) (Oral)   Ht 5' 8 (1.727 m)   Wt 171 lb 9.6 oz (77.8 kg)   SpO2 99%   BMI 26.09 kg/m  BP Readings from Last 3 Encounters:  03/16/24 122/80  12/27/23 122/70  11/25/23 (!) 168/83   Wt Readings from Last 3 Encounters:  03/16/24 171 lb 9.6 oz (77.8 kg)  12/27/23 165 lb 6.4 oz (75 kg)  11/25/23 170 lb 9.6 oz (77.4 kg)      Physical Exam Constitutional:      General: He is not in acute  distress.    Appearance: Normal appearance.  HENT:     Head: Normocephalic and atraumatic.     Jaw: No tenderness or pain on movement.     Ears:     Comments: B/l TM full. No erythema, suppurative fluid, or effusion.    Nose: Nose normal.     Mouth/Throat:     Mouth: Mucous membranes are moist.     Pharynx: Oropharynx is clear. No posterior oropharyngeal erythema.     Tonsils: No tonsillar exudate.  Cardiovascular:     Rate and Rhythm: Normal rate and regular rhythm.     Heart sounds: Normal heart sounds. No murmur heard.    No gallop.  Pulmonary:     Effort: Pulmonary effort is normal. No respiratory distress.     Breath sounds: Normal breath sounds. No wheezing, rhonchi or rales.  Lymphadenopathy:     Cervical: No cervical adenopathy.  Skin:    General: Skin is warm and dry.  Neurological:     Mental Status: He is alert and oriented to person, place, and time.        10/26/2023   10:29 AM 04/06/2023    3:04 PM 10/07/2022    3:54 PM  Depression screen PHQ 2/9  Decreased Interest 0 0 0  Down, Depressed, Hopeless 0 0 0  PHQ - 2 Score 0 0 0  Altered sleeping  0 0  Tired, decreased energy  0 0  Change in appetite  0 0  Feeling bad or failure about yourself   0 0  Trouble concentrating  0 0  Moving slowly or fidgety/restless  0 0  Suicidal thoughts  0 0  PHQ-9 Score  0  0   Difficult doing work/chores  Not difficult at all Not difficult at all     Data saved with a previous flowsheet row definition       No data to display            Results for orders placed or performed in visit on 03/16/24  POC Influenza A/B  Result Value Ref Range   Influenza A, POC Negative Negative   Influenza B, POC Negative Negative  POC COVID-19 BinaxNow  Result Value Ref Range   SARS Coronavirus 2 Ag Negative Negative  POCT rapid strep A  Result Value Ref Range   Rapid Strep A Screen Negative Negative      Assessment & Plan:   Viral URI  Eustachian tube dysfunction, right  Acute cough -     POCT Influenza A/B -     POC COVID-19 BinaxNow -     POCT rapid strep A  Sinus congestion -     POCT Influenza A/B -     POC COVID-19 BinaxNow -     POCT rapid strep A  Sore throat -     POCT Influenza A/B -     POC COVID-19 BinaxNow -     POCT rapid strep A  Acute viral URI symptoms.  POC COVID, flu, and strep testing negative.  Supportive care advised with OTC cough/cold medications for people with high blood pressure, gargling with warm salt water or Chloraseptic spray, warm fluids, honey, nasal saline rinse or Flonase, Tylenol , rest.  Avoid decongestants.  Consider retesting for COVID in the next few days.  Follow-up with PCP as needed.  No follow-ups on file.   Clotilda JONELLE Single, MD

## 2024-03-16 NOTE — Telephone Encounter (Signed)
 FYI Only or Action Required?: FYI only for provider: appointment scheduled on 11/13.  Patient was last seen in primary care on 10/26/2023 by Plotnikov, Karlynn GAILS, MD.  Called Nurse Triage reporting Otalgia (Ear pain, swollen glands).  Symptoms began yesterday.  Interventions attempted: Tylenol ,Rest, hydration, or home remedies.  Symptoms are: gradually worsening.  Triage Disposition: See Physician Within 24 Hours  Patient/caregiver understands and will follow disposition?: yes  Copied from CRM #[id number]. Topic: Clinical - Red Word Triage >> Mar 16, 2024  8:29 AM Berneda FALCON wrote: Red Word that prompted transfer to Nurse Triage: Patient states he has a bad cold/flu-earache right ear, swollen glands for the last couple of days. Cough, sneezing, runny nose, congestion, no fever, no vomiting. Would like an appt today if possible Reason for Disposition  Earache  (Exceptions: Brief ear pain of lasting less than 60 minutes, or earache occurring during air travel.)  Answer Assessment - Initial Assessment Questions 1. LOCATION: Which ear is involved?     R ear pain 2. ONSET: When did the ear pain start?      Last night 3. SEVERITY: How bad is the pain?  (Scale 1-10; mild, moderate or severe)     5/10 4. URI SYMPTOMS: Do you have a runny nose or cough?     Cough, sneezing, runny nose, congestion 5. FEVER: Do you have a fever? If Yes, ask: What is your temperature, how was it measured, and when did it start?     no 6. CAUSE: Have you been swimming recently?, How often do you use Q-TIPS?, Have you had any recent air travel or scuba diving?     Sinus related 7. OTHER SYMPTOMS: Do you have any other symptoms? (e.g., decreased hearing, dizziness, headache, stiff neck, vomiting)     Disoriented due to ear  Protocols used: [facility]illa

## 2024-03-16 NOTE — Telephone Encounter (Signed)
 Pt called back stating that her received a call drom [facility] and they were cancelling his appt. Pt re-scheduled at [facility].

## 2024-03-20 DIAGNOSIS — F411 Generalized anxiety disorder: Secondary | ICD-10-CM | POA: Diagnosis not present

## 2024-03-21 ENCOUNTER — Ambulatory Visit: Payer: Self-pay

## 2024-03-21 ENCOUNTER — Encounter: Payer: Self-pay | Admitting: Emergency Medicine

## 2024-03-21 ENCOUNTER — Ambulatory Visit (INDEPENDENT_AMBULATORY_CARE_PROVIDER_SITE_OTHER): Admitting: Emergency Medicine

## 2024-03-21 VITALS — BP 140/80 | HR 58 | Temp 97.8°F | Ht 68.0 in | Wt 171.0 lb

## 2024-03-21 DIAGNOSIS — R6889 Other general symptoms and signs: Secondary | ICD-10-CM | POA: Diagnosis not present

## 2024-03-21 DIAGNOSIS — B9689 Other specified bacterial agents as the cause of diseases classified elsewhere: Secondary | ICD-10-CM | POA: Diagnosis not present

## 2024-03-21 DIAGNOSIS — J329 Chronic sinusitis, unspecified: Secondary | ICD-10-CM

## 2024-03-21 MED ORDER — AMOXICILLIN-POT CLAVULANATE 875-125 MG PO TABS: 1.0000 | ORAL_TABLET | Freq: Two times a day (BID) | ORAL | 0 refills | Status: AC

## 2024-03-21 NOTE — Assessment & Plan Note (Signed)
 Symptom management discussed Continue over-the-counter Mucinex DM Recommend saline nasal sprays frequent during the day Advised to rest and stay well-hydrated Recommend follow-up with PCP if no better or worse during the next several days or weeks

## 2024-03-21 NOTE — Telephone Encounter (Signed)
 Booked appt within hour and called [facility] to inform of appt: informed [facility] that pt was made aware of 10 minute grace period and pt only 1 mile away: staff informed ok to book & if pt more than 10 minute late it would be up to provider to determine if pt would still be seen or not: nurse verbalized understanding.

## 2024-03-21 NOTE — Patient Instructions (Signed)

## 2024-03-21 NOTE — Telephone Encounter (Addendum)
 FYI Only or Action Required?: FYI only for provider: appointment scheduled on 03/21/2024.  Patient was last seen in primary care on 03/16/2024 by Mercer Clotilda SAUNDERS, MD.  Called Nurse Triage reporting Cough.  Symptoms began a week ago.  Interventions attempted: Nothing.  Symptoms are: gradually worsening.  Triage Disposition: See PCP When Office is Open (Within 3 Days)  Patient/caregiver understands and will follow disposition?: Yes    Copied from CRM #[id number]. Topic: Clinical - Red Word Triage >> Mar 21, 2024  2:11 PM Frederich PARAS wrote: [facility] that prompted transfer to Nurse Triage: extreme congestion, productive cough  pt went to see someone last wed , he got a serious congestion, green mucus, not going , getting worst. cough, Reason for Disposition  Cough has been present for > 3 weeks  Answer Assessment - Initial Assessment Questions 1. ONSET: When did the cough begin?      X week and worsening over weekend 2. SEVERITY: How bad is the cough today?      moderate 3. SPUTUM: Describe the color of your sputum (e.g., none, dry cough; clear, white, yellow, green)     greenish 4. HEMOPTYSIS: Are you coughing up any blood? If Yes, ask: How much? (e.g., flecks, streaks, tablespoons, etc.)     no 5. DIFFICULTY BREATHING: Are you having difficulty breathing? If Yes, ask: How bad is it? (e.g., mild, moderate, severe)      no 6. FEVER: Do you have a fever? If Yes, ask: What is your temperature, how was it measured, and when did it start?     Warm to touch 7. CARDIAC HISTORY: Do you have any history of heart disease? (e.g., heart attack, congestive heart failure)      no 8. LUNG HISTORY: Do you have any history of lung disease?  (e.g., pulmonary embolus, asthma, emphysema)     Heart disease 9. PE RISK FACTORS: Do you have a history of blood clots? (or: recent major surgery, recent prolonged travel, bedridden)     no 10. OTHER SYMPTOMS: Do you have any other  symptoms? (e.g., runny nose, wheezing, chest pain)       Body aches, wheezing, runny nose, stuffy nose, chest soreness from coughing 11. PREGNANCY: Is there any chance you are pregnant? When was your last menstrual period?       na 12. TRAVEL: Have you traveled out of the country in the last month? (e.g., travel history, exposures)       na  Protocols used: Cough - Acute Productive-A-AH

## 2024-03-21 NOTE — Assessment & Plan Note (Signed)
 Secondary to upper viral respiratory infection Clinically stable. Recommend to start Augmentin 875 mg twice a day for 7 to 10 days Symptom management discussed Advised to rest and stay well-hydrated Advised to contact the office if no better or worse during the next several days

## 2024-03-21 NOTE — Progress Notes (Signed)
 Jared Alvarez 73 y.o.   Chief Complaint  Patient presents with   URI    Congestion, runny nose, coughing, mucus has turned dark green, pt is negative for covid and strep     HISTORY OF PRESENT ILLNESS: Acute problem visit today. This is a 73 y.o. male complaining of flulike symptoms that started about 1 week ago Went to urgent care center.  Tested negative for both COVID and strep Did a little better but over the weekend started feeling worse.  Productive cough with nasal mucousy dark green discharge Had fever in the beginning.  Not anymore.  Able to eat and drink.  Denies nausea or vomiting.  Denies abdominal pain or diarrhea. Has been taking over-the-counter Mucinex DM No other associated symptoms No other complaints or medical concerns today.  URI  Associated symptoms include congestion, coughing and a sore throat. Pertinent negatives include no abdominal pain, chest pain, diarrhea, dysuria, headaches, nausea, rash, vomiting or wheezing.     Prior to Admission medications   Medication Sig Start Date End Date Taking? Authorizing Provider  acetaminophen  (TYLENOL ) 500 MG tablet Take 1 tablet (500 mg total) by mouth every 6 (six) hours as needed. Patient taking differently: Take 500 mg by mouth every 6 (six) hours as needed for mild pain (pain score 1-3). 07/25/21  Yes Plotnikov, Aleksei V, MD  alfuzosin (UROXATRAL) 10 MG 24 hr tablet Take 10 mg by mouth every evening. 10/01/22  Yes [provider]  amoxicillin-clavulanate (AUGMENTIN) 875-125 MG tablet Take 1 tablet by mouth 2 (two) times daily for 7 days. 03/21/24 03/28/24 Yes SagardiaEmil Schanz, MD  aspirin  81 MG tablet Take 1 tablet (81 mg total) by mouth daily. 06/23/17  Yes Plotnikov, Karlynn GAILS, MD  colchicine  0.6 MG tablet Take 1 tablet (0.6 mg total) by mouth 2 (two) times daily. If patient has flare up with gout 10/28/20  Yes Corey, Evan S, MD  Cyanocobalamin  (VITAMIN B-12) 500 MCG SUBL Place 1 tablet (500 mcg  total) under the tongue daily. Patient taking differently: Place 1 tablet under the tongue See admin instructions. Take one tablet by mouth two times a week per patient 06/23/17  Yes Plotnikov, Aleksei V, MD  diclofenac Sodium (VOLTAREN) 1 % GEL Apply 2 g topically daily as needed (for shoulder pain).   Yes [provider]  FLUoxetine  (PROZAC ) 40 MG capsule TAKE 1 CAPSULE (40 MG TOTAL) BY MOUTH DAILY. 01/10/24  Yes Plotnikov, Aleksei V, MD  Homeopathic Products ([facility]) OINT Apply 1 application topically 2 (two) times daily as needed (arthritis).   Yes [provider]  ibuprofen (ADVIL) 200 MG tablet Take 200 mg by mouth every 6 (six) hours as needed for mild pain (pain score 1-3).   Yes [provider]  IBUPROFEN-LINIMENT CO by Combination route.   Yes [provider]  LORazepam  (ATIVAN ) 1 MG tablet TAKE 0.5-1 TABLETS (0.5-1 MG TOTAL) BY MOUTH 2 (TWO) TIMES DAILY AS NEEDED FOR ANXIETY. 01/13/24  Yes Plotnikov, Karlynn GAILS, MD  losartan  (COZAAR ) 100 MG tablet TAKE 1 TABLET BY MOUTH EVERY DAY Patient taking differently: Take 100 mg by mouth every evening. 04/13/23  Yes Plotnikov, Karlynn GAILS, MD  Magnesium  200 MG TABS Take 1 tablet (200 mg total) by mouth daily. 12/21/23  Yes Delford Maude BROCKS, MD  metFORMIN  (GLUCOPHAGE ) 1000 MG tablet TAKE ONE TABLET BY MOUTH TWICE A DAY WITH A MEAL 07/14/23  Yes Plotnikov, Aleksei V, MD  pantoprazole  (PROTONIX ) 40 MG tablet TAKE 1 TABLET  BY MOUTH TWICE A DAY 10/11/23  Yes Plotnikov, Karlynn GAILS, MD  rosuvastatin  (CRESTOR ) 20 MG tablet Take 1 tablet (20 mg total) by mouth 3 (three) times a week. Pablo Heidelberg, and Fri 10/27/23  Yes Plotnikov, Karlynn GAILS, MD    Allergies  Allergen Reactions   Atorvastatin     REACTION: pains   Bupropion Hcl     REACTION: side effects   Doxazosin      Dizzy, syncope    Duloxetine     REACTION: dizzy   Fluticasone     Other reaction(s): other/intolerance   Nexium  [Esomeprazole  Magnesium ]     Rash on  legs   Simvastatin     REACTION: pain   Venlafaxine     REACTION: side effects   Viibryd  [Vilazodone  Hcl]     nightmares   Vilazodone      Other reaction(s): psychological reaction Nightmares    Ketorolac  Palpitations   Tamsulosin  Palpitations    Patient Active Problem List   Diagnosis Date Noted   Traumatic closed displaced fracture of acromial end of clavicle with routine healing, left 11/26/2023   Syncope 11/25/2023   New onset atrial fibrillation (HCC) 11/25/2023   AKI (acute kidney injury) 11/25/2023   Closed left clavicular fracture 11/25/2023   Hypomagnesemia 11/25/2023   History of essential hypertension 11/25/2023   Coronary artery disease involving native coronary artery of native heart without angina pectoris 11/25/2023   Syncope and collapse 11/25/2023   Abnormal CBC 10/26/2023   Sleep disorder 04/06/2023   Bladder-neck obstruction 02/26/2022   Hematospermia 10/07/2021   Orthostatic hypotension 07/23/2021   Somnolence, daytime 07/23/2021   Contact dermatitis due to plants, except food 04/09/2021   Elevated PSA 01/30/2021   Anemia of chronic disease 10/15/2020   Arthritis of foot 08/13/2020   Benign prostatic hyperplasia with urinary frequency 08/13/2020   Hallux rigidus, right foot 08/13/2020   Nonunion of osteotomy site 08/13/2020   Pharyngeal dysphagia 05/28/2020   Myalgia due to statin 03/05/2020   Erythrocytopenia 12/24/2019   Megaloblastic anemia 12/24/2019   Horseshoe tear of retina without detachment, right eye 08/16/2019   Other vitreous opacities, bilateral 08/16/2019   Vitreomacular adhesion, right eye 08/16/2019   Vitreous degeneration, left eye 08/16/2019   Age-related nuclear cataract, bilateral 08/14/2019   Aortic root enlargement 03/21/2019   History of depression 03/21/2019   History of gout 03/21/2019   History of syncope 03/21/2019   HLD (hyperlipidemia) 03/21/2019   Sinus bradycardia 03/21/2019   Ventricular premature complex  03/21/2019   Decreased GFR 03/01/2019   Thoracic ascending aortic aneurysm    Syncopal episodes 12/15/2017   Chronic pain of left thumb 07/02/2017   Foot pain, right 09/21/2016   Sensorineural hearing loss (SNHL), bilateral 06/11/2016   Insomnia 06/02/2016   Flu-like symptoms 06/07/2015   Cough 11/06/2014   Erectile dysfunction 09/28/2013   GERD (gastroesophageal reflux disease) 09/28/2013   Depression 08/08/2013   Actinic keratoses 08/08/2013   Recurrent major depressive disorder, in partial remission 08/08/2013   Ganglion of left wrist 09/30/2012   Urethral lesion 05/19/2012   Constipation 04/11/2012   Decreased hearing 12/15/2011   Chest pain 01/29/2011   Palpitations 01/29/2011   Snoring 01/29/2011   Acute upper respiratory infection 05/22/2010   HYPOGONADISM 03/24/2010   Inguinal hernia 03/24/2010   KNEE PAIN 03/24/2010   B12 deficiency 02/10/2010   Fatigue 02/10/2010   Hearing loss 12/02/2009   Neoplasm of uncertain behavior of skin 05/10/2009   Tinnitus 03/22/2009   VERTIGO 03/22/2009  LOW BACK PAIN 01/15/2009   DM2 (diabetes mellitus, type 2) (HCC) 12/21/2008   Dyslipidemia 12/21/2008   GOUT 12/21/2008   Anxiety disorder 12/21/2008   Adjustment disorder with mixed anxiety and depressed mood 12/21/2008   Essential hypertension 12/21/2008   Coronary atherosclerosis 12/21/2008   Osteoarthritis 12/21/2008   LEG CRAMPS 12/21/2008    Past Medical History:  Diagnosis Date   Allergy    Anxiety    no per pt   Aortic aneurysm    Coronary artery disease    Depression    no per pt   DM type 2 (diabetes mellitus, type 2) (HCC)    GERD (gastroesophageal reflux disease)    Gout    Hearing loss    Hyperlipidemia    Hypertension    Hypogonadism male    Inguinal hernia    right   LBP (low back pain)    Leg cramps    Osteoarthritis    Skin neoplasm    Tinnitus    since 1997   Vertigo    Vitamin B12 deficiency     Past Surgical History:  Procedure  Laterality Date   COLONOSCOPY     FOOT SURGERY     07-2019 triple bone fusion   FOOT SURGERY  08/2020   repeat fusion   heart stent     in 1997   INGUINAL HERNIA REPAIR     POLYPECTOMY      Social History   Socioeconomic History   Marital status: Married    Spouse name: Not on file   Number of children: Not on file   Years of education: Not on file   Highest education level: Not on file  Occupational History   Occupation: [employer] Engineer, Maintenance    Employer: [employer]  Tobacco Use   Smoking status: Never   Smokeless tobacco: Never  Vaping Use   Vaping status: Never Used  Substance and Sexual Activity   Alcohol use: Yes    Alcohol/week: 7.0 standard drinks of alcohol    Types: 7 Glasses of wine per week    Comment: socially   Drug use: No   Sexual activity: Yes  Other Topics Concern   Not on file  Social History Narrative   Not on file   Social Drivers of Health   Financial Resource Strain: Low Risk  (02/05/2021)   Overall Financial Resource Strain (CARDIA)    Difficulty of Paying Living Expenses: Not hard at all  Food Insecurity: No Food Insecurity (11/25/2023)   Hunger Vital Sign    Worried About Running Out of Food in the Last Year: Never true    Ran Out of Food in the Last Year: Never true  Transportation Needs: No Transportation Needs (11/25/2023)   PRAPARE - Administrator, Civil Service (Medical): No    Lack of Transportation (Non-Medical): No  Physical Activity: Sufficiently Active (02/05/2021)   Exercise Vital Sign    Days of Exercise per Week: 7 days    Minutes of Exercise per Session: 30 min  Stress: No Stress Concern Present (02/05/2021)   [employer] of Occupational Health - Occupational Stress Questionnaire    Feeling of Stress : Not at all  Social Connections: Moderately Isolated (11/25/2023)   Social Connection and Isolation Panel    Frequency of Communication with Friends and Family: Never    Frequency of Social  Gatherings with Friends and Family: Never    Attends Religious Services: Never  Active Member of Clubs or Organizations: Yes    Attends Banker Meetings: Never    Marital Status: Married  Catering Manager Violence: Not At Risk (11/25/2023)   Humiliation, Afraid, Rape, and Kick questionnaire    Fear of Current or Ex-Partner: No    Emotionally Abused: No    Physically Abused: No    Sexually Abused: No    Family History  Problem Relation Age of Onset   Heart disease Father    Arthritis Sister    Lupus Sister    Heart disease Brother    CVA Brother    Coronary artery disease Other    Hypertension Other    Colon cancer Neg Hx    Esophageal cancer Neg Hx    Stomach cancer Neg Hx    Rectal cancer Neg Hx    Colon polyps Neg Hx      Review of Systems  Constitutional:  Positive for fever.  HENT:  Positive for congestion and sore throat.   Respiratory:  Positive for cough and sputum production. Negative for hemoptysis, shortness of breath and wheezing.   Cardiovascular:  Negative for chest pain and palpitations.  Gastrointestinal:  Negative for abdominal pain, diarrhea, nausea and vomiting.  Genitourinary: Negative.  Negative for dysuria and hematuria.  Skin:  Negative for rash.  Neurological: Negative.  Negative for dizziness and headaches.  All other systems reviewed and are negative.   Vitals:   03/21/24 1442 03/21/24 1448  BP: (!) 140/80 (!) 140/80  Pulse: (!) 58   Temp: 97.8 F (36.6 C)   SpO2: 95%     Physical Exam Vitals reviewed.  Constitutional:      Appearance: Normal appearance.  HENT:     Head: Normocephalic.     Mouth/Throat:     Mouth: Mucous membranes are moist.     Pharynx: Oropharynx is clear.  Eyes:     Extraocular Movements: Extraocular movements intact.     Pupils: Pupils are equal, round, and reactive to light.  Cardiovascular:     Rate and Rhythm: Normal rate and regular rhythm.     Pulses: Normal pulses.     Heart sounds:  Normal heart sounds.  Pulmonary:     Effort: Pulmonary effort is normal.     Breath sounds: Normal breath sounds.  Musculoskeletal:     Cervical back: No tenderness.  Lymphadenopathy:     Cervical: No cervical adenopathy.  Skin:    General: Skin is warm and dry.     Capillary Refill: Capillary refill takes less than 2 seconds.  Neurological:     General: No focal deficit present.     Mental Status: He is alert and oriented to person, place, and time.  Psychiatric:        Mood and Affect: Mood normal.        Behavior: Behavior normal.      ASSESSMENT & PLAN: Problem List Items Addressed This Visit       Respiratory   Bacterial sinusitis - Primary   Secondary to upper viral respiratory infection Clinically stable. Recommend to start Augmentin 875 mg twice a day for 7 to 10 days Symptom management discussed Advised to rest and stay well-hydrated Advised to contact the office if no better or worse during the next several days      Relevant Medications   amoxicillin-clavulanate (AUGMENTIN) 875-125 MG tablet     Other   Flu-like symptoms   Symptom management discussed Continue over-the-counter Mucinex DM Recommend saline  nasal sprays frequent during the day Advised to rest and stay well-hydrated Recommend follow-up with PCP if no better or worse during the next several days or weeks      Patient Instructions  Sinus Infection, Adult A sinus infection is soreness and swelling (inflammation) of your sinuses. Sinuses are hollow spaces in the bones around your face. They are located: Around your eyes. In the middle of your forehead. Behind your nose. In your cheekbones. Your sinuses and nasal passages are lined with a fluid called mucus. Mucus drains out of your sinuses. Swelling can trap mucus in your sinuses. This lets germs (bacteria, virus, or fungus) grow, which leads to infection. Most of the time, this condition is caused by a virus. What are the  causes? Allergies. Asthma. Germs. Things that block your nose or sinuses. Growths in the nose (nasal polyps). Chemicals or irritants in the air. A fungus. This is rare. What increases the risk? Having a weak body defense system (immune system). Doing a lot of swimming or diving. Using nasal sprays too much. Smoking. What are the signs or symptoms? The main symptoms of this condition are pain and a feeling of pressure around the sinuses. Other symptoms include: Stuffy nose (congestion). This may make it hard to breathe through your nose. Runny nose (drainage). Soreness, swelling, and warmth in the sinuses. A cough that may get worse at night. Being unable to smell and taste. Mucus that collects in the throat or the back of the nose (postnasal drip). This may cause a sore throat or bad breath. Being very tired (fatigued). A fever. How is this diagnosed? Your symptoms. Your medical history. A physical exam. Tests to find out if your condition is short-term (acute) or long-term (chronic). Your doctor may: Check your nose for growths (polyps). Check your sinuses using a tool that has a light on one end (endoscope). Check for allergies or germs. Do imaging tests, such as an MRI or CT scan. How is this treated? Treatment for this condition depends on the cause and whether it is short-term or long-term. If caused by a virus, your symptoms should go away on their own within 10 days. You may be given medicines to relieve symptoms. They include: Medicines that shrink swollen tissue in the nose. A spray that treats swelling of the nostrils. Rinses that help get rid of thick mucus in your nose (nasal saline washes). Medicines that treat allergies (antihistamines). Over-the-counter pain relievers. If caused by bacteria, your doctor may wait to see if you will get better without treatment. You may be given antibiotic medicine if you have: A very bad infection. A weak body defense  system. If caused by growths in the nose, surgery may be needed. Follow these instructions at home: Medicines Take, use, or apply over-the-counter and prescription medicines only as told by your doctor. These may include nasal sprays. If you were prescribed an antibiotic medicine, take it as told by your doctor. Do not stop taking it even if you start to feel better. Hydrate and humidify  Drink enough water to keep your pee (urine) pale yellow. Use a cool mist humidifier to keep the humidity level in your home above 50%. Breathe in steam for 10-15 minutes, 3-4 times a day, or as told by your doctor. You can do this in the bathroom while a hot shower is running. Try not to spend time in cool or dry air. Rest Rest as much as you can. Sleep with your head raised (elevated). Make  sure you get enough sleep each night. General instructions  Put a warm, moist washcloth on your face 3-4 times a day, or as often as told by your doctor. Use nasal saline washes as often as told by your doctor. Wash your hands often with soap and water. If you cannot use soap and water, use hand sanitizer. Do not smoke. Avoid being around people who are smoking (secondhand smoke). Keep all follow-up visits. Contact a doctor if: You have a fever. Your symptoms get worse. Your symptoms do not get better within 10 days. Get help right away if: You have a very bad headache. You cannot stop vomiting. You have very bad pain or swelling around your face or eyes. You have trouble seeing. You feel confused. Your neck is stiff. You have trouble breathing. These symptoms may be an emergency. Get help right away. Call 911. Do not wait to see if the symptoms will go away. Do not drive yourself to the hospital. Summary A sinus infection is swelling of your sinuses. Sinuses are hollow spaces in the bones around your face. This condition is caused by tissues in your nose that become inflamed or swollen. This traps germs.  These can lead to infection. If you were prescribed an antibiotic medicine, take it as told by your doctor. Do not stop taking it even if you start to feel better. Keep all follow-up visits. This information is not intended to replace advice given to you by your health care provider. Make sure you discuss any questions you have with your health care provider. Document Revised: 03/25/2021 Document Reviewed: 03/25/2021 Elsevier Patient Education  2024 Elsevier Inc.     Emil Schaumann, MD [facility]

## 2024-03-27 DIAGNOSIS — F411 Generalized anxiety disorder: Secondary | ICD-10-CM | POA: Diagnosis not present

## 2024-04-05 ENCOUNTER — Other Ambulatory Visit: Payer: Self-pay | Admitting: Internal Medicine

## 2024-04-07 DIAGNOSIS — F411 Generalized anxiety disorder: Secondary | ICD-10-CM | POA: Diagnosis not present

## 2024-04-08 ENCOUNTER — Other Ambulatory Visit: Payer: Self-pay | Admitting: Internal Medicine

## 2024-04-10 ENCOUNTER — Other Ambulatory Visit (HOSPITAL_COMMUNITY): Payer: Self-pay

## 2024-04-10 ENCOUNTER — Telehealth: Payer: Self-pay

## 2024-04-10 DIAGNOSIS — Z23 Encounter for immunization: Secondary | ICD-10-CM | POA: Diagnosis not present

## 2024-04-10 NOTE — Telephone Encounter (Signed)
 Pharmacy Patient Advocate Encounter   Received notification from CoverMyMeds that prior authorization for Tadalafil  5mg  tabs is required/requested.   Insurance verification completed.   The patient is insured through [facility].   Per test claim: PA required; PA submitted to above mentioned insurance via Latent Key/confirmation #/EOC [facility] Status is pending

## 2024-04-10 NOTE — Telephone Encounter (Signed)
 Pharmacy Patient Advocate Encounter  Received notification from [employer] MEDICAID that Prior Authorization for Tadalafil  5mg  tabs has been APPROVED from 04/10/24 to 05/03/2098   PA #/Case ID/Reference #: [id number]  Called and left a message at [facility] to notify of the approval. Max of 90 day supply.

## 2024-04-11 ENCOUNTER — Other Ambulatory Visit (HOSPITAL_COMMUNITY): Payer: Self-pay

## 2024-04-14 DIAGNOSIS — F411 Generalized anxiety disorder: Secondary | ICD-10-CM | POA: Diagnosis not present

## 2024-04-19 ENCOUNTER — Ambulatory Visit: Admitting: Cardiovascular Disease

## 2024-05-10 ENCOUNTER — Ambulatory Visit: Admitting: Internal Medicine

## 2024-05-10 ENCOUNTER — Encounter: Payer: Self-pay | Admitting: Internal Medicine

## 2024-05-10 VITALS — BP 142/90 | HR 54 | Temp 97.7°F | Ht 68.0 in | Wt 169.8 lb

## 2024-05-10 DIAGNOSIS — F4323 Adjustment disorder with mixed anxiety and depressed mood: Secondary | ICD-10-CM

## 2024-05-10 DIAGNOSIS — E538 Deficiency of other specified B group vitamins: Secondary | ICD-10-CM

## 2024-05-10 DIAGNOSIS — J019 Acute sinusitis, unspecified: Secondary | ICD-10-CM | POA: Diagnosis not present

## 2024-05-10 DIAGNOSIS — K219 Gastro-esophageal reflux disease without esophagitis: Secondary | ICD-10-CM | POA: Diagnosis not present

## 2024-05-10 DIAGNOSIS — R490 Dysphonia: Secondary | ICD-10-CM

## 2024-05-10 MED ORDER — SILDENAFIL CITRATE 100 MG PO TABS: 50.0000 mg | ORAL_TABLET | Freq: Every day | ORAL | 11 refills | Status: AC | PRN

## 2024-05-10 MED ORDER — BUSPIRONE HCL 10 MG PO TABS: 5.0000 mg | ORAL_TABLET | Freq: Two times a day (BID) | ORAL | 3 refills | Status: DC

## 2024-05-10 MED ORDER — LORAZEPAM 1 MG PO TABS: 0.5000 mg | ORAL_TABLET | Freq: Every evening | ORAL | 3 refills | Status: AC | PRN

## 2024-05-10 NOTE — Assessment & Plan Note (Signed)
 W/hoarsness -- PPI helps Nexium  - rash  On Protonix . Stop if rash

## 2024-05-10 NOTE — Progress Notes (Signed)
 "  Subjective:  Patient ID: Jared Alvarez, male    DOB: [birth year]  Age: 74 y.o. MRN: [id number]  CC: Medical Management of Chronic Issues (6 Month follow up)   HPI Jared Alvarez presents for CAD, HTN, depression Stress w/ill mother in law in [place] C/o anxiety - worse C/o dry cough - ?Losartan  C/o loss of taste after a cold in Nov    Outpatient Medications Prior to Visit  Medication Sig Dispense Refill   acetaminophen  (TYLENOL ) 500 MG tablet Take 1 tablet (500 mg total) by mouth every 6 (six) hours as needed. (Patient taking differently: Take 500 mg by mouth every 6 (six) hours as needed for mild pain (pain score 1-3).) 30 tablet 2   alfuzosin (UROXATRAL) 10 MG 24 hr tablet Take 10 mg by mouth every evening.     aspirin  81 MG tablet Take 1 tablet (81 mg total) by mouth daily. 30 tablet 2   colchicine  0.6 MG tablet Take 1 tablet (0.6 mg total) by mouth 2 (two) times daily. If patient has flare up with gout 30 tablet 5   Cyanocobalamin  (VITAMIN B-12) 500 MCG SUBL Place 1 tablet (500 mcg total) under the tongue daily. (Patient taking differently: Place 1 tablet under the tongue See admin instructions. Take one tablet by mouth two times a week per patient) 150 tablet 2   diclofenac Sodium (VOLTAREN) 1 % GEL Apply 2 g topically daily as needed (for shoulder pain).     FLUoxetine  (PROZAC ) 40 MG capsule TAKE 1 CAPSULE (40 MG TOTAL) BY MOUTH DAILY. 90 capsule 1   Homeopathic Products (ARNICARE ARNICA) OINT Apply 1 application topically 2 (two) times daily as needed (arthritis).     ibuprofen (ADVIL) 200 MG tablet Take 200 mg by mouth every 6 (six) hours as needed for mild pain (pain score 1-3).     IBUPROFEN-LINIMENT CO by Combination route.     losartan  (COZAAR ) 100 MG tablet TAKE 1 TABLET BY MOUTH EVERY DAY 90 tablet 3   Magnesium  200 MG TABS Take 1 tablet (200 mg total) by mouth daily.     metFORMIN  (GLUCOPHAGE ) 1000 MG tablet TAKE ONE TABLET BY MOUTH TWICE A DAY WITH A MEAL 180 tablet 3    pantoprazole  (PROTONIX ) 40 MG tablet TAKE 1 TABLET BY MOUTH TWICE A DAY 180 tablet 3   rosuvastatin  (CRESTOR ) 20 MG tablet Take 1 tablet (20 mg total) by mouth 3 (three) times a week. Mon, Wed, and Fri 36 tablet 4   LORazepam  (ATIVAN ) 1 MG tablet TAKE 0.5-1 TABLETS (0.5-1 MG TOTAL) BY MOUTH 2 (TWO) TIMES DAILY AS NEEDED FOR ANXIETY. 60 tablet 1   tadalafil  (CIALIS ) 5 MG tablet TAKE 1 TABLET (5 MG TOTAL) BY MOUTH DAILY AT 6 (SIX) AM. 180 tablet 1   No facility-administered medications prior to visit.    ROS: Review of Systems  Constitutional:  Negative for appetite change, fatigue and unexpected weight change.  HENT:  Negative for congestion, nosebleeds, sneezing, sore throat and trouble swallowing.   Eyes:  Negative for itching and visual disturbance.  Respiratory:  Positive for cough.   Cardiovascular:  Negative for chest pain, palpitations and leg swelling.  Gastrointestinal:  Negative for abdominal distention, blood in stool, diarrhea and nausea.  Genitourinary:  Negative for frequency and hematuria.  Musculoskeletal:  Negative for back pain, gait problem, joint swelling and neck pain.  Skin:  Negative for rash.  Neurological:  Negative for dizziness, tremors, speech difficulty and weakness.  Psychiatric/Behavioral:  Negative for agitation, dysphoric mood, sleep disturbance and suicidal ideas. The patient is nervous/anxious.     Objective:  BP (!) 142/90   Pulse (!) 54   Temp 97.7 F (36.5 C)   Ht 5' 8 (1.727 m)   Wt 169 lb 12.8 oz (77 kg)   SpO2 98%   BMI 25.82 kg/m   BP Readings from Last 3 Encounters:  05/10/24 (!) 142/90  03/21/24 (!) 140/80  03/16/24 122/80    Wt Readings from Last 3 Encounters:  05/10/24 169 lb 12.8 oz (77 kg)  03/21/24 171 lb (77.6 kg)  03/16/24 171 lb 9.6 oz (77.8 kg)    Physical Exam Constitutional:      General: He is not in acute distress.    Appearance: He is well-developed.     Comments: NAD  Eyes:     Conjunctiva/sclera:  Conjunctivae normal.     Pupils: Pupils are equal, round, and reactive to light.  Neck:     Thyroid : No thyromegaly.     Vascular: No JVD.  Cardiovascular:     Rate and Rhythm: Normal rate and regular rhythm.     Heart sounds: Normal heart sounds. No murmur heard.    No friction rub. No gallop.  Pulmonary:     Effort: Pulmonary effort is normal. No respiratory distress.     Breath sounds: Normal breath sounds. No wheezing or rales.  Chest:     Chest wall: No tenderness.  Abdominal:     General: Bowel sounds are normal. There is no distension.     Palpations: Abdomen is soft. There is no mass.     Tenderness: There is no abdominal tenderness. There is no guarding or rebound.  Musculoskeletal:        General: No tenderness. Normal range of motion.     Cervical back: Normal range of motion.  Lymphadenopathy:     Cervical: No cervical adenopathy.  Skin:    General: Skin is warm and dry.     Findings: No rash.  Neurological:     Mental Status: He is alert and oriented to person, place, and time.     Cranial Nerves: No cranial nerve deficit.     Motor: No abnormal muscle tone.     Coordination: Coordination normal.     Gait: Gait normal.     Deep Tendon Reflexes: Reflexes are normal and symmetric.  Psychiatric:        Behavior: Behavior normal.        Thought Content: Thought content normal.        Judgment: Judgment normal.     Lab Results  Component Value Date   WBC 5.5 11/25/2023   HGB 9.8 (L) 11/25/2023   HCT 28.4 (L) 11/25/2023   PLT 172 11/25/2023   GLUCOSE 108 (H) 02/22/2024   CHOL 193 10/22/2023   TRIG 37.0 10/22/2023   HDL 67.50 10/22/2023   LDLDIRECT 149.6 12/15/2011   LDLCALC 119 (H) 10/22/2023   ALT 10 11/25/2023   AST 15 11/25/2023   NA 138 02/22/2024   K 5.1 02/22/2024   CL 100 02/22/2024   CREATININE 0.95 02/22/2024   BUN 17 02/22/2024   CO2 24 02/22/2024   TSH 1.559 11/25/2023   PSA 1.06 10/22/2023   INR 1.0 11/24/2023   HGBA1C 6.0 10/22/2023     DG Clavicle Left Result Date: 12/03/2023 CLINICAL DATA:  Left clavicular pain EXAM: LEFT CLAVICLE - 2+ VIEWS COMPARISON:  11/24/2023 FINDINGS: Distal left clavicular fracture is  noted similar to that seen on the prior exam. No significant callus formation is noted. No other focal abnormality is seen. IMPRESSION: Distal left clavicular fracture stable in appearance from the prior exam. Electronically Signed   By: Oneil Devonshire M.D.   On: 12/03/2023 01:33   ECHOCARDIOGRAM COMPLETE Result Date: 11/25/2023    ECHOCARDIOGRAM REPORT   Patient Name:   Jared Alvarez Date of Exam: 11/25/2023 Medical Rec #:  [id number]        Height:       68.0 in Accession #:    [id number]       Weight:       170.6 lb Date of Birth:  June 03, 1950        BSA:          1.910 m Patient Age:    73 years         BP:           153/79 mmHg Patient Gender: M                HR:           59 bpm. Exam Location:  Inpatient Procedure: 2D Echo, Cardiac Doppler and Color Doppler (Both Spectral and Color            Flow Doppler were utilized during procedure). Indications:    Syncope R55  History:        Patient has prior history of Echocardiogram examinations, most                 recent 01/06/2018. Arrythmias:Atrial Fibrillation and Bradycardia,                 Signs/Symptoms:Hypotension, Syncope and Chest Pain; Risk                 Factors:Hypertension, Diabetes and Dyslipidemia.  Sonographer:    Thea Norlander RCS Referring Phys: [id number] JUSTIN B HOWERTER IMPRESSIONS  1. Left ventricular ejection fraction, by estimation, is 55%. The left ventricle has normal function. The left ventricle has no regional wall motion abnormalities. Left ventricular diastolic parameters are consistent with Grade I diastolic dysfunction (impaired relaxation).  2. Right ventricular systolic function is normal. The right ventricular size is normal. Tricuspid regurgitation signal is inadequate for assessing PA pressure.  3. Left atrial size was mildly dilated.  4. The  mitral valve is normal in structure. Mild mitral valve regurgitation. No evidence of mitral stenosis.  5. The aortic valve is tricuspid. There is mild calcification of the aortic valve. Aortic valve regurgitation is mild. No aortic stenosis is present.  6. Aortic dilatation noted. There is mild dilatation of the ascending aorta, measuring 41 mm.  7. The IVC was not visualized. FINDINGS  Left Ventricle: Left ventricular ejection fraction, by estimation, is 55%. The left ventricle has normal function. The left ventricle has no regional wall motion abnormalities. The left ventricular internal cavity size was normal in size. There is no left ventricular hypertrophy. Left ventricular diastolic parameters are consistent with Grade I diastolic dysfunction (impaired relaxation). Right Ventricle: The right ventricular size is normal. No increase in right ventricular wall thickness. Right ventricular systolic function is normal. Tricuspid regurgitation signal is inadequate for assessing PA pressure. Left Atrium: Left atrial size was mildly dilated. Right Atrium: Right atrial size was normal in size. Pericardium: There is no evidence of pericardial effusion. Mitral Valve: The mitral valve is normal in structure. Mild mitral valve regurgitation. No evidence of mitral valve stenosis. Tricuspid Valve:  The tricuspid valve is normal in structure. Tricuspid valve regurgitation is trivial. Aortic Valve: The aortic valve is tricuspid. There is mild calcification of the aortic valve. Aortic valve regurgitation is mild. No aortic stenosis is present. Aortic valve peak gradient measures 10.0 mmHg. Pulmonic Valve: The pulmonic valve was normal in structure. Pulmonic valve regurgitation is trivial. Aorta: The aortic root is normal in size and structure and aortic dilatation noted. There is mild dilatation of the ascending aorta, measuring 41 mm. Venous: The IVC was not visualized. IAS/Shunts: No atrial level shunt detected by color flow  Doppler.  LEFT VENTRICLE PLAX 2D LVIDd:         4.80 cm   Diastology LVIDs:         3.60 cm   LV e' medial:    10.00 cm/s LV PW:         1.10 cm   LV E/e' medial:  8.1 LV IVS:        0.80 cm   LV e' lateral:   14.10 cm/s LVOT diam:     2.20 cm   LV E/e' lateral: 5.8 LV SV:         90 LV SV Index:   47 LVOT Area:     3.80 cm  RIGHT VENTRICLE RV S prime:     12.70 cm/s TAPSE (M-mode): 2.1 cm LEFT ATRIUM           Index        RIGHT ATRIUM           Index LA diam:      4.10 cm 2.15 cm/m   RA Area:     16.10 cm LA Vol (A2C): 39.2 ml 20.52 ml/m  RA Volume:   34.50 ml  18.06 ml/m LA Vol (A4C): 66.5 ml 34.81 ml/m  AORTIC VALVE AV Area (Vmax): 2.57 cm AV Vmax:        158.50 cm/s AV Peak Grad:   10.0 mmHg LVOT Vmax:      107.00 cm/s LVOT Vmean:     67.700 cm/s LVOT VTI:       0.238 m  AORTA Ao Root diam: 3.50 cm Ao Asc diam:  4.10 cm MITRAL VALVE MV Area (PHT): 3.48 cm    SHUNTS MV Decel Time: 218 msec    Systemic VTI:  0.24 m MV E velocity: 81.20 cm/s  Systemic Diam: 2.20 cm MV A velocity: 71.30 cm/s MV E/A ratio:  1.14 Dalton McleanMD Electronically signed by Ezra Kanner Signature Date/Time: 11/25/2023/5:19:20 PM    Final    DG Chest Port 1 View Result Date: 11/25/2023 CLINICAL DATA:  106001.  Syncope.  Fell. EXAM: PORTABLE CHEST 1 VIEW COMPARISON:  Chest PA Lat 01/09/2023. FINDINGS: There is mild cardiomegaly. No evidence of CHF. Stable mediastinum with aortic calcific plaques and tortuosity. The lungs are clear. There is no substantial pleural effusion. Thoracic spondylosis. The bones are demineralized.  No acute osseous findings are seen AP. IMPRESSION: 1. No acute radiographic chest findings. 2. Cardiomegaly. 3. Aortic atherosclerosis. 4. Osteopenia. Electronically Signed   By: Francis Quam M.D.   On: 11/25/2023 07:15    Assessment & Plan:   Problem List Items Addressed This Visit     B12 deficiency   On B12      Adjustment disorder with mixed anxiety and depressed mood - Primary   Wife has  cancer. Stress w/ill mother in law in [place] 2026      GERD (gastroesophageal reflux disease)  W/hoarsness -- PPI helps Nexium  - rash  On Protonix . Stop if rash      Hoarseness of voice   Continue with PPI Refer to ENT      Other Visit Diagnoses       Acute non-recurrent sinusitis, unspecified location       Relevant Orders   Ambulatory referral to ENT         Meds ordered this encounter  Medications   LORazepam  (ATIVAN ) 1 MG tablet    Sig: Take 0.5-1 tablets (0.5-1 mg total) by mouth at bedtime as needed for anxiety.    Dispense:  30 tablet    Refill:  3    This request is for a new prescription for a controlled substance as required by Federal/State law.   busPIRone  (BUSPAR ) 10 MG tablet    Sig: Take 0.5-1 tablets (5-10 mg total) by mouth 2 (two) times daily.    Dispense:  60 tablet    Refill:  3   sildenafil  (VIAGRA ) 100 MG tablet    Sig: Take 0.5-1 tablets (50-100 mg total) by mouth daily as needed for erectile dysfunction.    Dispense:  5 tablet    Refill:  11      Follow-up: Return in about 3 months (around 08/08/2024) for a follow-up visit.  Marolyn Noel, MD "

## 2024-05-10 NOTE — Assessment & Plan Note (Signed)
 Wife has cancer. Stress w/ill mother in law in [place] 2026

## 2024-05-10 NOTE — Patient Instructions (Signed)

## 2024-05-10 NOTE — Assessment & Plan Note (Signed)
 On B12

## 2024-05-22 NOTE — Progress Notes (Unsigned)
 CARDIOLOGY CONSULT NOTE       Patient ID: Jared Alvarez MRN: [id number] DOB/AGE: Feb 24, 1951 74 y.o.  Referring Physician: [name] Primary Physician: Garald Karlynn GAILS, MD Primary Cardiologist: Delford   HPI:  74 y.o. referred by Dr Rodman for CAD .History of stent to LAD in 1997 in [place] .  CRF;s include HTN, HLD , DM Known false positive ECG;s on stress testing normal perfusion on test 01/25/18 History of neurally mediated syncope since teenage years Has had PVC;s in past with no significant NSVT  Monitor with nocturnal bradycardia so has not been on beta blocker EF normal on TTE 01/06/18 Thoracic aorta 4.2 cm on CT 2019   Sees Dr Abran for colon polyps 3 more polyps removed 05/13/21   Lived  in [place] during COVID  Now back in [place] and Saw a cardiologist at [facility] He indicates having a CT scan spring 2023  and aortic aneurysm gone down in size   He still plays the base. He is active swimming daily and renovating a house. BP low and norvasc  decreased 09/26/21   He has had a couple of vagal like episodes with low BP He is taking cialis  in am and cardura  at night for his prostate which is not ideal. Told him to ask Dr Rodman about proscar or non vasodilatory Rx Or referral to urology for more definitive Rx that would not require vasodilators   Gated chest CTA 05/26/23 only 3.8 cm ? Prior measurement artifact due to blurring   Admitted 11/25/23 with syncope after playing guitar and standing for 3 hours. Given liter fluid. Not postural Had a brief period of PAF spontaneously converted in setting of chronic bradycardia. He declined anticoagulation Rx Had a distal  left clavicle fracture Rx conservatively.   Echo 11/25/23 EF 55% normal RV mild MR and AV sclerosis  14 day monitor reviewed   Patient had a min HR of 37 bpm, max HR of 152 bpm, and avg HR of 56 bpm. Predominant underlying rhythm was Sinus Rhythm. First Degree AV Block was present. Bundle Branch Block/IVCD was present. 1 run of  Ventricular Tachycardia occurred lasting 5 beats  with a max rate of 152 bpm (avg 105 bpm). 45 Supraventricular Tachycardia runs occurred, the run with the fastest interval lasting 6 beats with a max rate of 143 bpm, the longest lasting 13 beats with an avg rate of 98 bpm. Isolated SVEs were occasional  (1.6%, 17823), SVE Couplets were rare (<1.0%, 632), and SVE Triplets were rare (<1.0%, 101). Isolated VEs were occasional (1.4%, 15370), VE Couplets were rare (<1.0%, 248), and VE Triplets were rare (<1.0%, 23). Ventricular Bigeminy and Trigeminy were  present.  ***    ROS All other systems reviewed and negative except as noted above  Past Medical History:  Diagnosis Date   Allergy    Anxiety    no per pt   Aortic aneurysm    Coronary artery disease    Depression    no per pt   DM type 2 (diabetes mellitus, type 2) (HCC)    GERD (gastroesophageal reflux disease)    Gout    Hearing loss    Hyperlipidemia    Hypertension    Hypogonadism male    Inguinal hernia    right   LBP (low back pain)    Leg cramps    Osteoarthritis    Skin neoplasm    Tinnitus    since 1997   Vertigo    Vitamin B12 deficiency  Family History  Problem Relation Age of Onset   Heart disease Father    Arthritis Sister    Lupus Sister    Heart disease Brother    CVA Brother    Coronary artery disease Other    Hypertension Other    Colon cancer Neg Hx    Esophageal cancer Neg Hx    Stomach cancer Neg Hx    Rectal cancer Neg Hx    Colon polyps Neg Hx     Social History   Socioeconomic History   Marital status: Married    Spouse name: Not on file   Number of children: Not on file   Years of education: Not on file   Highest education level: Not on file  Occupational History   Occupation: United Engineer, Maintenance    Employer: [employer]  Tobacco Use   Smoking status: Never   Smokeless tobacco: Never  Vaping Use   Vaping status: Never Used  Substance and Sexual Activity    Alcohol use: Yes    Alcohol/week: 7.0 standard drinks of alcohol    Types: 7 Glasses of wine per week    Comment: socially   Drug use: No   Sexual activity: Yes  Other Topics Concern   Not on file  Social History Narrative   Not on file   Social Drivers of Health   Tobacco Use: Low Risk (05/10/2024)   Patient History    Smoking Tobacco Use: Never    Smokeless Tobacco Use: Never    Passive Exposure: Not on file  Financial Resource Strain: Not on file  Food Insecurity: No Food Insecurity (11/25/2023)   Epic    Worried About Programme Researcher, Broadcasting/film/video in the Last Year: Never true    Ran Out of Food in the Last Year: Never true  Transportation Needs: No Transportation Needs (11/25/2023)   Epic    Lack of Transportation (Medical): No    Lack of Transportation (Non-Medical): No  Physical Activity: Not on file  Stress: Not on file  Social Connections: Moderately Isolated (11/25/2023)   Social Connection and Isolation Panel    Frequency of Communication with Friends and Family: Never    Frequency of Social Gatherings with Friends and Family: Never    Attends Religious Services: Never    Database Administrator or Organizations: Yes    Attends Banker Meetings: Never    Marital Status: Married  Catering Manager Violence: Not At Risk (11/25/2023)   Epic    Fear of Current or Ex-Partner: No    Emotionally Abused: No    Physically Abused: No    Sexually Abused: No  Depression (PHQ2-9): Low Risk (10/26/2023)   Depression (PHQ2-9)    PHQ-2 Score: 0  Alcohol Screen: Not on file  Housing: Low Risk (11/25/2023)   Epic    Unable to Pay for Housing in the Last Year: No    Number of Times Moved in the Last Year: 0    Homeless in the Last Year: No  Utilities: Not At Risk (11/25/2023)   Epic    Threatened with loss of utilities: No  Health Literacy: Not on file    Past Surgical History:  Procedure Laterality Date   COLONOSCOPY     FOOT SURGERY     07-2019 triple bone fusion    FOOT SURGERY  08/2020   repeat fusion   heart stent     in 1997   INGUINAL HERNIA REPAIR  POLYPECTOMY        Current Outpatient Medications:    acetaminophen  (TYLENOL ) 500 MG tablet, Take 1 tablet (500 mg total) by mouth every 6 (six) hours as needed. (Patient taking differently: Take 500 mg by mouth every 6 (six) hours as needed for mild pain (pain score 1-3).), Disp: 30 tablet, Rfl: 2   alfuzosin (UROXATRAL) 10 MG 24 hr tablet, Take 10 mg by mouth every evening., Disp: , Rfl:    aspirin  81 MG tablet, Take 1 tablet (81 mg total) by mouth daily., Disp: 30 tablet, Rfl: 2   busPIRone  (BUSPAR ) 10 MG tablet, Take 0.5-1 tablets (5-10 mg total) by mouth 2 (two) times daily., Disp: 60 tablet, Rfl: 3   colchicine  0.6 MG tablet, Take 1 tablet (0.6 mg total) by mouth 2 (two) times daily. If patient has flare up with gout, Disp: 30 tablet, Rfl: 5   Cyanocobalamin  (VITAMIN B-12) 500 MCG SUBL, Place 1 tablet (500 mcg total) under the tongue daily. (Patient taking differently: Place 1 tablet under the tongue See admin instructions. Take one tablet by mouth two times a week per patient), Disp: 150 tablet, Rfl: 2   diclofenac Sodium (VOLTAREN) 1 % GEL, Apply 2 g topically daily as needed (for shoulder pain)., Disp: , Rfl:    FLUoxetine  (PROZAC ) 40 MG capsule, TAKE 1 CAPSULE (40 MG TOTAL) BY MOUTH DAILY., Disp: 90 capsule, Rfl: 1   Homeopathic Products (ARNICARE ARNICA) OINT, Apply 1 application topically 2 (two) times daily as needed (arthritis)., Disp: , Rfl:    ibuprofen (ADVIL) 200 MG tablet, Take 200 mg by mouth every 6 (six) hours as needed for mild pain (pain score 1-3)., Disp: , Rfl:    IBUPROFEN-LINIMENT CO, by Combination route., Disp: , Rfl:    LORazepam  (ATIVAN ) 1 MG tablet, Take 0.5-1 tablets (0.5-1 mg total) by mouth at bedtime as needed for anxiety., Disp: 30 tablet, Rfl: 3   losartan  (COZAAR ) 100 MG tablet, TAKE 1 TABLET BY MOUTH EVERY DAY, Disp: 90 tablet, Rfl: 3   Magnesium  200 MG TABS,  Take 1 tablet (200 mg total) by mouth daily., Disp: , Rfl:    metFORMIN  (GLUCOPHAGE ) 1000 MG tablet, TAKE ONE TABLET BY MOUTH TWICE A DAY WITH A MEAL, Disp: 180 tablet, Rfl: 3   pantoprazole  (PROTONIX ) 40 MG tablet, TAKE 1 TABLET BY MOUTH TWICE A DAY, Disp: 180 tablet, Rfl: 3   rosuvastatin  (CRESTOR ) 20 MG tablet, Take 1 tablet (20 mg total) by mouth 3 (three) times a week. Mon, Wed, and Fri, Disp: 36 tablet, Rfl: 4   sildenafil  (VIAGRA ) 100 MG tablet, Take 0.5-1 tablets (50-100 mg total) by mouth daily as needed for erectile dysfunction., Disp: 5 tablet, Rfl: 11     Physical Exam: There were no vitals taken for this visit.    Affect appropriate Healthy:  appears stated age HEENT: normal Neck supple with no adenopathy JVP normal no bruits no thyromegaly Lungs clear with no wheezing and good diaphragmatic motion Heart:  S1/S2 no murmur, no rub, gallop or click PMI normal Abdomen: benighn, BS positve, no tenderness, no AAA no bruit.  No HSM or HJR Distal pulses intact with no bruits No edema Neuro non-focal Skin warm and dry No muscular weakness   Labs:   Lab Results  Component Value Date   WBC 5.5 11/25/2023   HGB 9.8 (L) 11/25/2023   HCT 28.4 (L) 11/25/2023   MCV 93.4 11/25/2023   PLT 172 11/25/2023    No results for input(s):  NA, K, CL, CO2, BUN, CREATININE, CALCIUM , PROT, BILITOT, ALKPHOS, ALT, AST, GLUCOSE in the last 168 hours.  Invalid input(s): LABALBU  No results found for: CKTOTAL, CKMB, CKMBINDEX, TROPONINI  Lab Results  Component Value Date   CHOL 193 10/22/2023   CHOL 147 06/11/2022   CHOL 142 07/23/2021   Lab Results  Component Value Date   HDL 67.50 10/22/2023   HDL 75.00 06/11/2022   HDL 67.90 07/23/2021   Lab Results  Component Value Date   LDLCALC 119 (H) 10/22/2023   LDLCALC 64 06/11/2022   LDLCALC 68 07/23/2021   Lab Results  Component Value Date   TRIG 37.0 10/22/2023   TRIG 41.0 06/11/2022   TRIG  29.0 07/23/2021   Lab Results  Component Value Date   CHOLHDL 3 10/22/2023   CHOLHDL 2 06/11/2022   CHOLHDL 2 07/23/2021   Lab Results  Component Value Date   LDLDIRECT 149.6 12/15/2011      Radiology: No results found.  EKG:  11/24/23 afib rate 73    ASSESSMENT AND PLAN:   CAD: stent to LAD 1997 in [place] . Normal perfusion on myovue 2019 with known false positive treadmill tests No beta blocker with ambient bradycardia.  ASA/Statin Active no angina observe  In setting of recent syncope will update Myovue  HTN:  BP low d/c norvasc  all together  DM:  Discussed low carb diet.  Target hemoglobin A1c is 6.5 or less.  Continue current medications.  A1c 6.4  GI:  post colonoscopy with polyp removal f/u Dr Abran  HLD:  on statin LDL 72 10/15/20 Aneurysm:  Ascending thoracic aorta only 3.8 cm on 05/26/23  Prostate:  f/u primary consider stopping cardura /cialis  and using proscar or non vasodilatory Rx or referral to GU given his propensity for vagal episodes and pre syncope  Syncope:  history of vasovagal. Nothing noted on admission with updated TTE normal LV/RV function monitor with no PAF average HR 56 bpm an first degree AV block Given prior episodes of syncope, first degree AV block and PAF discussed ILR with him *** Avoid beta blocker  PAF:  refused anticoagulation in hospital. ***  Refer to EP for ILR Lexiscan myovue   F/U in a year   Signed: Maude Emmer 05/22/2024, 1:12 PM

## 2024-05-28 DIAGNOSIS — R490 Dysphonia: Secondary | ICD-10-CM | POA: Insufficient documentation

## 2024-05-28 NOTE — Assessment & Plan Note (Signed)
 Continue with PPI Refer to ENT

## 2024-06-01 ENCOUNTER — Ambulatory Visit: Admitting: Cardiovascular Disease

## 2024-06-01 ENCOUNTER — Other Ambulatory Visit: Payer: Self-pay | Admitting: Internal Medicine

## 2024-08-15 ENCOUNTER — Ambulatory Visit: Admitting: Cardiovascular Disease
